# Patient Record
Sex: Female | Born: 1980
Health system: Southern US, Community
[De-identification: ages and names within clinical notes are randomized; demographics above are authoritative.]

## PROBLEM LIST (undated history)

## (undated) DIAGNOSIS — Q2730 Arteriovenous malformation, site unspecified: Secondary | ICD-10-CM

## (undated) DIAGNOSIS — D259 Leiomyoma of uterus, unspecified: Secondary | ICD-10-CM

## (undated) DIAGNOSIS — G43909 Migraine, unspecified, not intractable, without status migrainosus: Secondary | ICD-10-CM

## (undated) DIAGNOSIS — Z8774 Personal history of (corrected) congenital malformations of heart and circulatory system: Secondary | ICD-10-CM

## (undated) DIAGNOSIS — D649 Anemia, unspecified: Secondary | ICD-10-CM

## (undated) DIAGNOSIS — I10 Essential (primary) hypertension: Secondary | ICD-10-CM

## (undated) DIAGNOSIS — IMO0002 Reserved for concepts with insufficient information to code with codable children: Secondary | ICD-10-CM

## (undated) DIAGNOSIS — M549 Dorsalgia, unspecified: Secondary | ICD-10-CM

## (undated) DIAGNOSIS — N201 Calculus of ureter: Secondary | ICD-10-CM

## (undated) DIAGNOSIS — K219 Gastro-esophageal reflux disease without esophagitis: Secondary | ICD-10-CM

## (undated) DIAGNOSIS — G43709 Chronic migraine without aura, not intractable, without status migrainosus: Secondary | ICD-10-CM

## (undated) DIAGNOSIS — M2550 Pain in unspecified joint: Secondary | ICD-10-CM

## (undated) DIAGNOSIS — F419 Anxiety disorder, unspecified: Secondary | ICD-10-CM

## (undated) DIAGNOSIS — N200 Calculus of kidney: Secondary | ICD-10-CM

## (undated) HISTORY — DX: Essential (primary) hypertension: I10

## (undated) HISTORY — PX: BRAIN SURGERY: SHX531

## (undated) HISTORY — DX: Anxiety disorder, unspecified: F41.9

## (undated) HISTORY — PX: CEREBRAL EMBOLIZATION: SHX1327

## (undated) HISTORY — PX: CRANIOTOMY: SHX93

## (undated) HISTORY — DX: Pain in unspecified joint: M25.50

## (undated) HISTORY — DX: Dorsalgia, unspecified: M54.9

## (undated) HISTORY — DX: Arteriovenous malformation, site unspecified: Q27.30

## (undated) HISTORY — DX: Migraine, unspecified, not intractable, without status migrainosus: G43.909

## (undated) HISTORY — DX: Anemia, unspecified: D64.9

## (undated) HISTORY — PX: OTHER SURGICAL HISTORY: SHX169

---
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
## 2003-08-07 ENCOUNTER — Other Ambulatory Visit: Admission: RE | Admit: 2003-08-07 | Discharge: 2003-08-07 | Payer: Self-pay | Admitting: Obstetrics and Gynecology

## 2004-09-10 ENCOUNTER — Ambulatory Visit: Payer: Self-pay | Admitting: *Deleted

## 2006-05-16 ENCOUNTER — Emergency Department (HOSPITAL_COMMUNITY): Admission: EM | Admit: 2006-05-16 | Discharge: 2006-05-16 | Payer: Self-pay | Admitting: Family Medicine

## 2006-11-09 HISTORY — PX: CRANIOTOMY: SHX93

## 2007-04-07 ENCOUNTER — Emergency Department (HOSPITAL_COMMUNITY): Admission: EM | Admit: 2007-04-07 | Discharge: 2007-04-07 | Payer: Self-pay | Admitting: Emergency Medicine

## 2007-06-05 ENCOUNTER — Inpatient Hospital Stay (HOSPITAL_COMMUNITY): Admission: EM | Admit: 2007-06-05 | Discharge: 2007-06-08 | Payer: Self-pay | Admitting: Emergency Medicine

## 2007-06-05 ENCOUNTER — Encounter: Payer: Self-pay | Admitting: Internal Medicine

## 2007-06-05 IMAGING — CT CT HEAD W/O CM
1 series · 15 of 30 positions shown, 19 images · IV contrast (agent unspecified)
Comparison: None.

CLINICAL DATA: 25-year-old female, progressive severe migraine headaches. 
HEAD CT WITHOUT CONTRAST:
TECHNIQUE: Contiguous axial images were obtained from the base of the skull through the vertex according to standard protocol without contrast.

[Series 2: headseq 4.8 h45s · axial · 0.43mm/px · z∈[-209,-54]mm · 15 of 36 slices shown, 19 images]
[im 2/36  brain]
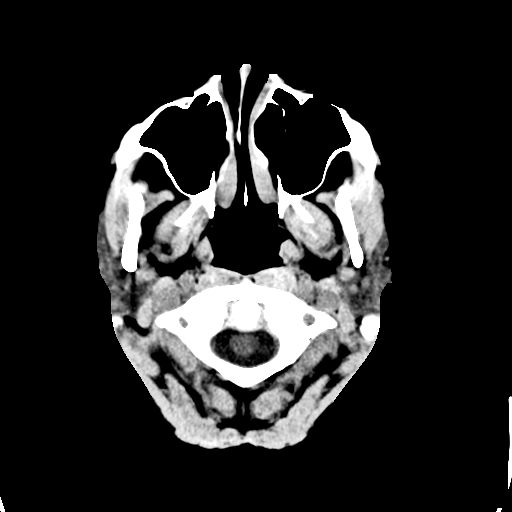
[im 2/36  bone]
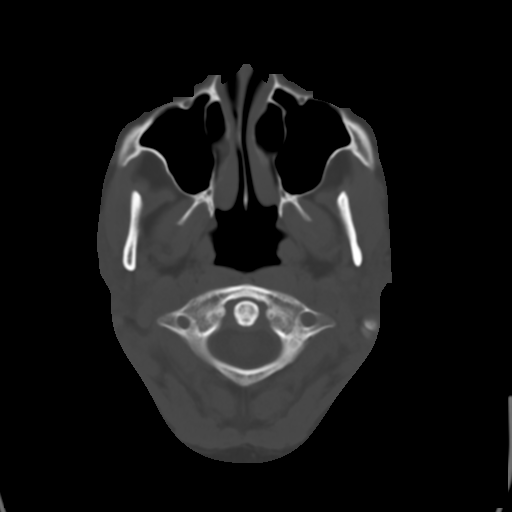
[im 4/36  brain]
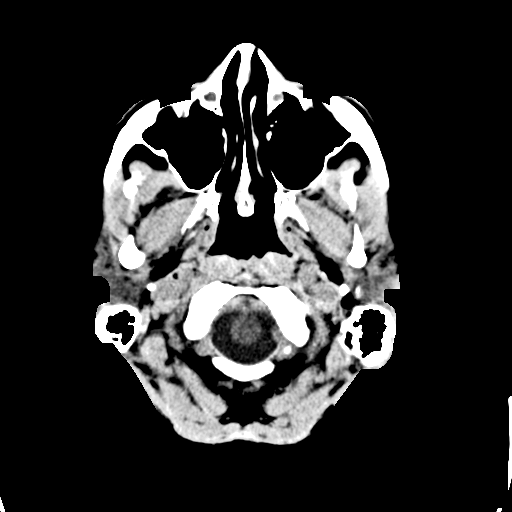
[im 7/36  brain]
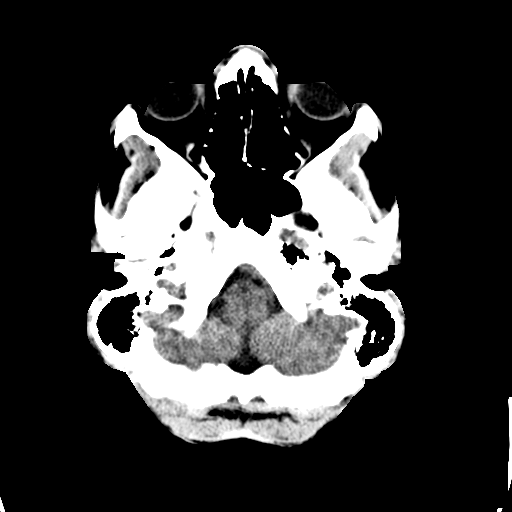
[im 9/36  brain]
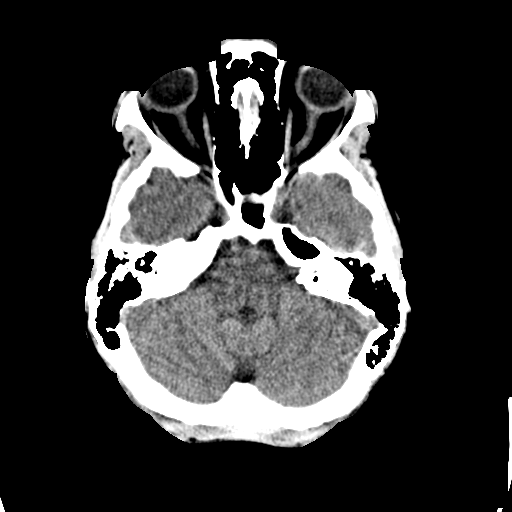
[im 11/36  brain]
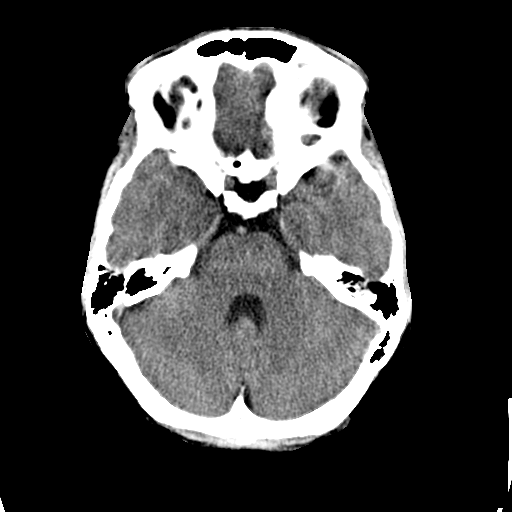
[im 11/36  bone]
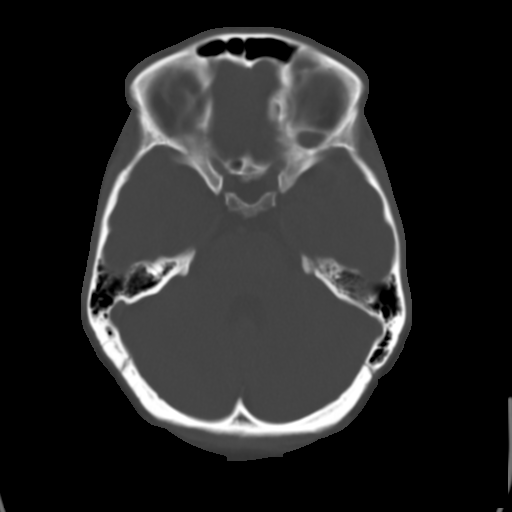
[im 14/36  brain]
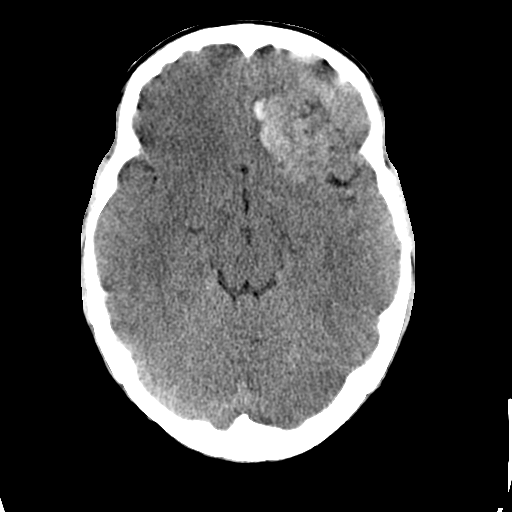
[im 16/36  brain]
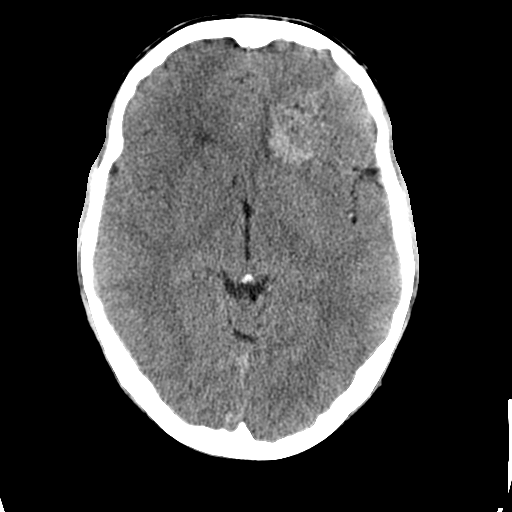
[im 19/36  brain]
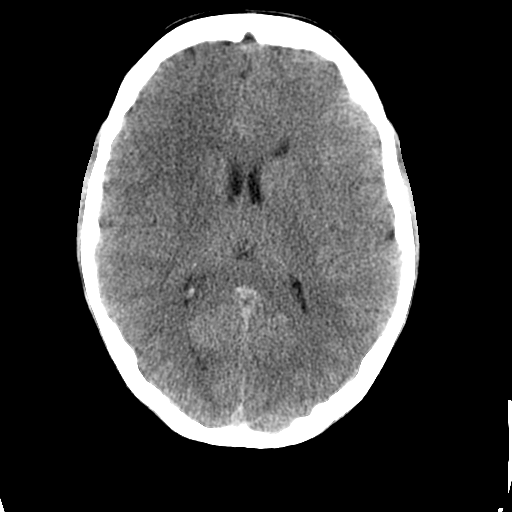
[im 20/36  brain]
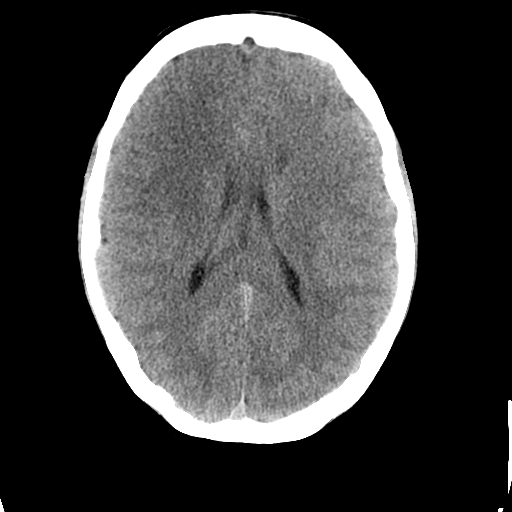
[im 20/36  bone]
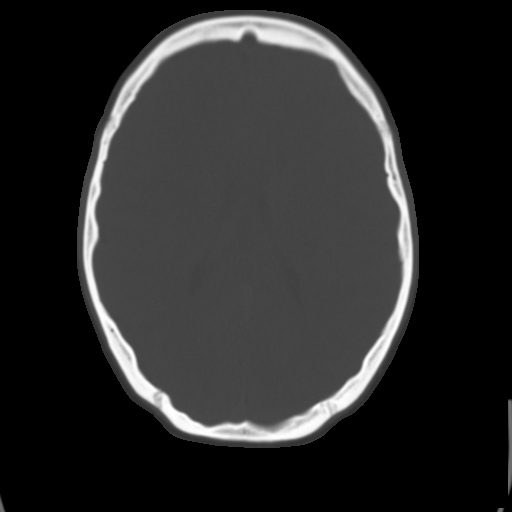
[im 22/36  brain]
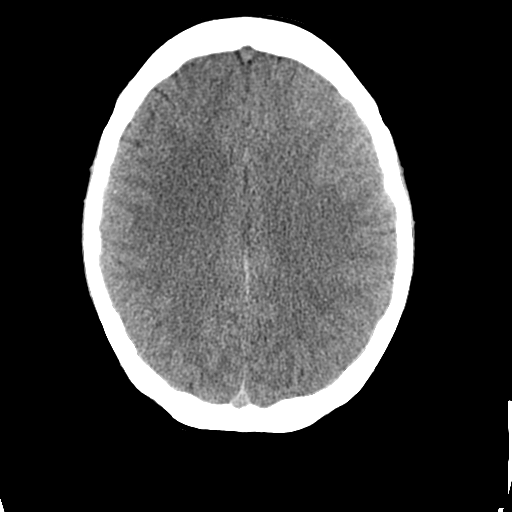
[im 25/36  brain]
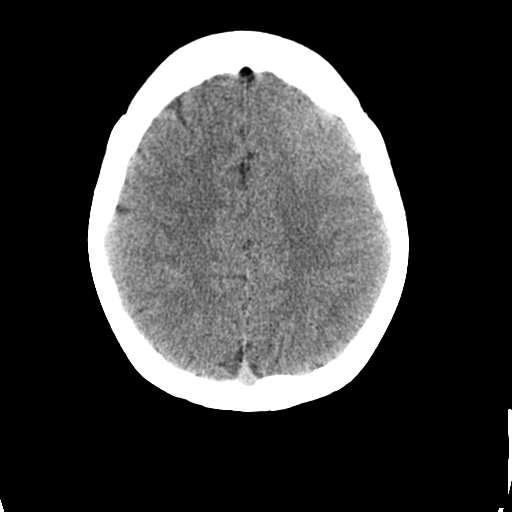
[im 27/36  brain]
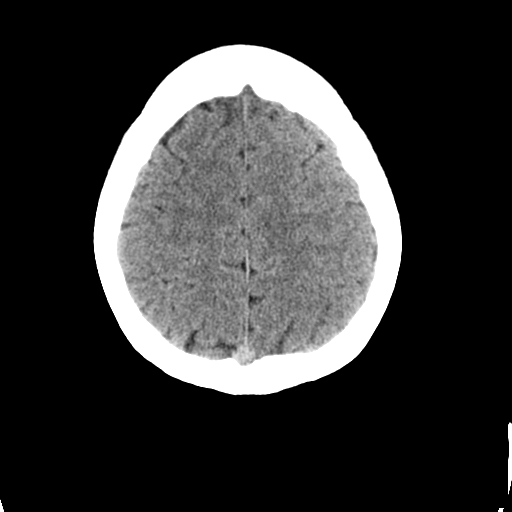
[im 29/36  brain]
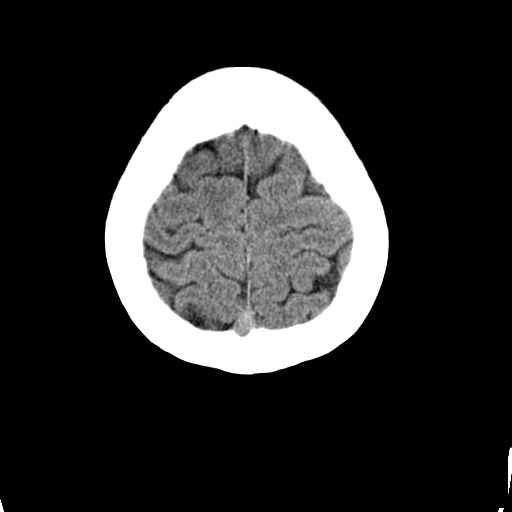
[im 29/36  bone]
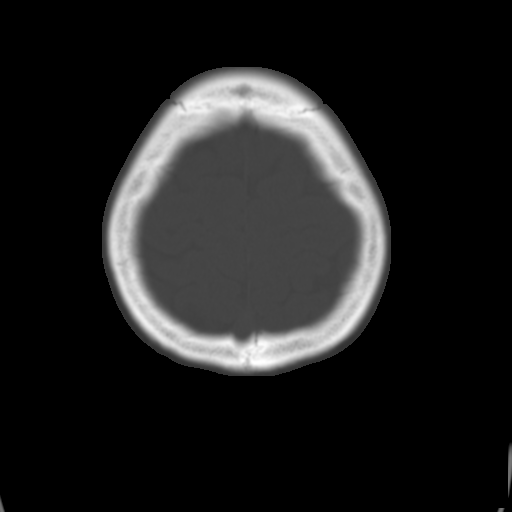
[im 32/36  brain]
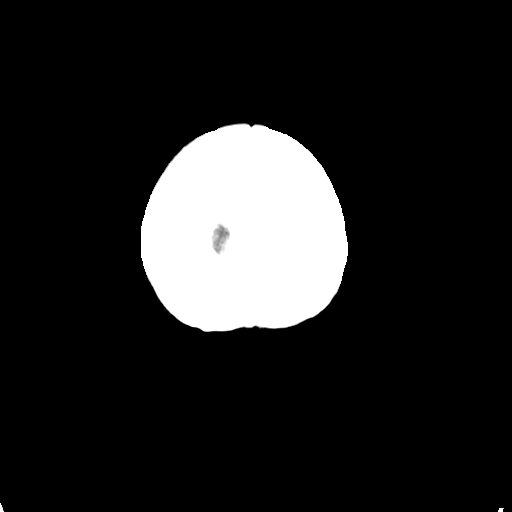
[im 34/36  brain]
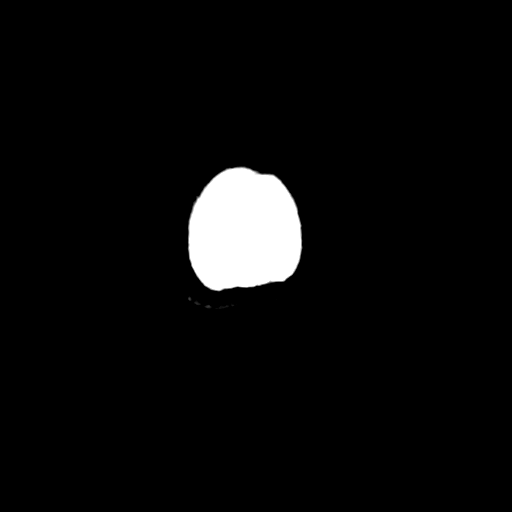

[15 of 30 positions shown; findings below may reference images not displayed]

FINDINGS: In the left frontal lobe, there is a hyperdense intracranial brain lesion with evidence of acute hemorrhage.  This appears to be intraaxial and has a somewhat serpiginous pattern.  Differential considerations would include a hemorrhagic intraaxial brain mass, metastasis, or vascular abnormality such as an arterial venous malformation.  There is mass effect in the left frontal lobe.  No significant midline shift or hydrocephalus.  No extraaxial collection.  Cisterns are patent.  
Mastoid sinuses are clear.
IMPRESSION: Left anterior frontal hemorrhagic 3 x 4 cm mass, serpiginous pattern is noted.  Hemorrhagic brain lesion, metastasis, or vascular lesion is suspected such as an AVM.  Further evaluation recommended with MRI including contrast and MRA.  
Note:  Findings were discussed with Dr. TIGER at [HOSPITAL].

## 2008-04-12 ENCOUNTER — Ambulatory Visit: Payer: Self-pay | Admitting: Family Medicine

## 2008-04-12 DIAGNOSIS — J069 Acute upper respiratory infection, unspecified: Secondary | ICD-10-CM | POA: Insufficient documentation

## 2008-05-21 ENCOUNTER — Ambulatory Visit: Payer: Self-pay | Admitting: Family Medicine

## 2008-05-21 DIAGNOSIS — J101 Influenza due to other identified influenza virus with other respiratory manifestations: Secondary | ICD-10-CM | POA: Insufficient documentation

## 2008-05-21 DIAGNOSIS — J111 Influenza due to unidentified influenza virus with other respiratory manifestations: Secondary | ICD-10-CM | POA: Insufficient documentation

## 2008-06-29 ENCOUNTER — Emergency Department (HOSPITAL_COMMUNITY): Admission: EM | Admit: 2008-06-29 | Discharge: 2008-06-29 | Payer: Self-pay | Admitting: Emergency Medicine

## 2008-08-03 ENCOUNTER — Ambulatory Visit (HOSPITAL_COMMUNITY): Admission: RE | Admit: 2008-08-03 | Discharge: 2008-08-03 | Payer: Self-pay | Admitting: Neurosurgery

## 2008-10-12 ENCOUNTER — Ambulatory Visit: Payer: Self-pay | Admitting: Family Medicine

## 2008-10-12 DIAGNOSIS — E785 Hyperlipidemia, unspecified: Secondary | ICD-10-CM | POA: Insufficient documentation

## 2008-10-12 LAB — CONVERTED CEMR LAB
ALT: 13 units/L (ref 0–35)
AST: 19 units/L (ref 0–37)
CO2: 24 meq/L (ref 19–32)
Chloride: 105 meq/L (ref 96–112)
Cholesterol: 164 mg/dL (ref 0–200)
Creatinine, Ser: 0.77 mg/dL (ref 0.40–1.20)
HDL: 41 mg/dL (ref >39)
Potassium: 4.3 meq/L (ref 3.5–5.3)
Total Bilirubin: 0.9 mg/dL (ref 0.3–1.2)
Total Protein: 7.3 g/dL (ref 6.0–8.3)
Triglycerides: 61 mg/dL (ref <150)

## 2008-10-15 ENCOUNTER — Encounter: Payer: Self-pay | Admitting: Family Medicine

## 2008-12-29 ENCOUNTER — Ambulatory Visit: Payer: Self-pay | Admitting: Family Medicine

## 2008-12-29 DIAGNOSIS — J0100 Acute maxillary sinusitis, unspecified: Secondary | ICD-10-CM | POA: Insufficient documentation

## 2009-01-24 ENCOUNTER — Inpatient Hospital Stay (HOSPITAL_COMMUNITY): Admission: AD | Admit: 2009-01-24 | Discharge: 2009-01-24 | Payer: Self-pay | Admitting: Obstetrics and Gynecology

## 2009-01-24 ENCOUNTER — Encounter (INDEPENDENT_AMBULATORY_CARE_PROVIDER_SITE_OTHER): Payer: Self-pay | Admitting: Certified Nurse Midwife

## 2009-07-05 ENCOUNTER — Encounter: Payer: Self-pay | Admitting: Obstetrics and Gynecology

## 2009-07-05 ENCOUNTER — Inpatient Hospital Stay (HOSPITAL_COMMUNITY): Admission: AD | Admit: 2009-07-05 | Discharge: 2009-07-08 | Payer: Self-pay | Admitting: Obstetrics and Gynecology

## 2009-07-06 ENCOUNTER — Encounter (INDEPENDENT_AMBULATORY_CARE_PROVIDER_SITE_OTHER): Payer: Self-pay | Admitting: Obstetrics and Gynecology

## 2009-07-10 LAB — HM PAP SMEAR: HM Pap smear: NORMAL

## 2009-10-15 ENCOUNTER — Ambulatory Visit: Payer: Self-pay | Admitting: Family Medicine

## 2009-10-15 ENCOUNTER — Encounter: Admission: RE | Admit: 2009-10-15 | Discharge: 2009-10-15 | Payer: Self-pay | Admitting: Family Medicine

## 2009-10-15 DIAGNOSIS — M25569 Pain in unspecified knee: Secondary | ICD-10-CM | POA: Insufficient documentation

## 2009-11-14 ENCOUNTER — Telehealth (INDEPENDENT_AMBULATORY_CARE_PROVIDER_SITE_OTHER): Payer: Self-pay | Admitting: *Deleted

## 2009-11-15 ENCOUNTER — Ambulatory Visit: Payer: Self-pay | Admitting: Family Medicine

## 2009-11-15 DIAGNOSIS — I10 Essential (primary) hypertension: Secondary | ICD-10-CM | POA: Insufficient documentation

## 2010-03-13 ENCOUNTER — Ambulatory Visit: Payer: Self-pay | Admitting: Family Medicine

## 2010-03-14 LAB — CONVERTED CEMR LAB
ALT: 15 units/L (ref 0–35)
BUN: 14 mg/dL (ref 6–23)
CO2: 23 meq/L (ref 19–32)
Calcium: 8.8 mg/dL (ref 8.4–10.5)
Chloride: 105 meq/L (ref 96–112)
Glucose, Bld: 81 mg/dL (ref 70–99)
HDL: 21 mg/dL — ABNORMAL LOW (ref >39)
LDL Cholesterol: 87 mg/dL (ref 0–99)
Potassium: 4.1 meq/L (ref 3.5–5.3)
Sodium: 138 meq/L (ref 135–145)
VLDL: 29 mg/dL (ref 0–40)

## 2010-12-01 ENCOUNTER — Encounter: Payer: Self-pay | Admitting: Interventional Radiology

## 2010-12-01 ENCOUNTER — Encounter: Payer: Self-pay | Admitting: Obstetrics and Gynecology

## 2010-12-09 NOTE — Assessment & Plan Note (Signed)
Summary: f/u HTN   Vital Signs:  Patient profile:   30 year old female Height:      63 inches Weight:      173 pounds BMI:     30.76 O2 Sat:      96 % on Room air Pulse rate:   87 / minute BP sitting:   131 / 84  (left arm) Cuff size:   regular  Vitals Entered By: Payton Spark CMA (November 15, 2009 10:10 AM)  O2 Flow:  Room air CC: Elevated BP. Has been taking labetalol (unsure of dosage) given to her while she was pregnant.   Primary Care Provider:  Seymour Bars DO  CC:  Elevated BP. Has been taking labetalol (unsure of dosage) given to her while she was pregnant..  History of Present Illness: 30 yo WF presents for f/u HTN.  She had Pranolol in the past for both HTN and migraines.  She was on Labetolol during her pregnancy and she has put herself back on it.  She feels like it's high at work.  She has checked it at work and her DBPs have consistenly been > 90.  She is getting some HAs.  She had AVM surgery a few years ago.      Current Medications (verified): 1)  Complete-Rf Prenatal 90-1 Mg Tabs (Prenatal W/o A-Fe Cbn-Dss-Fa) .... Take 1 Tablet By Mouth Once A Day 2)  Fish Oil 1200mg  .... Daily 3)  Tylenol 1000mg  .... Prn 4)  Meloxicam 7.5 Mg Tabs (Meloxicam) .[name].. 1-2 Tabs By Mouth Daily As Needed For Knee Pain  Allergies (verified): 1)  ! Sulfa 2)  ! Lisinopril 3)  ! Codeine  Past History:  Past Medical History: Reviewed history from 10/15/2009 and no changes required. HTN -- off meds. 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 migraines G0 OB - Dr Billy Coast  Past Surgical History: Reviewed history from 04/12/2008 and no changes required. 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  denies ETOH. Condoms for birth control. Walks some. Married to [employer]. Infant son 'Jean Rosenthal'  Review of Systems      See  HPI  Physical Exam  General:  alert, well-developed, well-nourished, and well-hydrated.   Head:  normocephalic and atraumatic.   Mouth:  pharynx pink and moist.   Neck:  no masses.   Lungs:  Normal respiratory effort, chest expands symmetrically. Lungs are clear to auscultation, no crackles or wheezes. Heart:  Normal rate and regular rhythm. S1 and S2 normal without gallop, murmur, click, rub or other extra sounds.   Impression & Recommendations:  Problem # 1:  ESSENTIAL HYPERTENSION, BENIGN (ICD-401.1) Familial HTN + migraines.  Will keep her on a beta blocker, changing to once daily ATenolol.  Call if any problems.  She will check her BPs are work and update her labs.  F/U in 4 mos. Her updated medication list for this problem includes:    Atenolol 50 Mg Tabs (Atenolol) .[name]... 1 tab by mouth daily  Orders: T-Comprehensive Metabolic Panel ([id number])  Complete Medication List: 1)  Complete-rf Prenatal 90-1 Mg Tabs (Prenatal w/o a-fe cbn-dss-fa) .... Take 1 tablet by mouth once a day 2)  Fish Oil 1200mg   .... Daily 3)  Tylenol 1000mg   .... Prn 4)  Meloxicam 7.5 Mg Tabs (Meloxicam) .[name].. 1-2 tabs by mouth daily as  needed for knee pain 5)  Atenolol 50 Mg Tabs (Atenolol) .[name].. 1 tab by mouth daily  Other Orders: T-Lipid Profile ([id number])  Patient Instructions: 1)  STart Atenolol 50 mg once daily. 2)  Update fasting labs and will call you w/ results the following day. 3)  Return for f/u HTN/ wt in 4 mos. 4)  Call if any problems on the new medication. 5)  Goal BP 100-130/ 60-80 Prescriptions: ATENOLOL 50 MG TABS (ATENOLOL) 1 tab by mouth daily  #30 x 3   Entered and Authorized by:   Kaitlin Bars DO   Signed by:   Kaitlin Bars DO on 11/15/2009   Method used:   Electronically to        [facility] [phone]* (retail)       [address]       [place], [place]  [address]       Ph: [phone]       Fax: [phone]   RxID:   [id number]

## 2010-12-09 NOTE — Assessment & Plan Note (Signed)
Summary: URI/ BP f/u   Vital Signs:  Patient profile:   30 year old female Height:      63 inches Weight:      176 pounds BMI:     31.29 O2 Sat:      99 % on Room air Temp:     98.6 degrees F oral Pulse rate:   82 / minute BP sitting:   117 / 79  (left arm) Cuff size:   regular  Vitals Entered By: Payton Spark CMA (Mar 13, 2010 10:23 AM)  O2 Flow:  Room air CC: F/U HTN. Also c/o runny nose and cough x 1 day   Primary Care Provider:  Seymour Bars DO  CC:  F/U HTN. Also c/o runny nose and cough x 1 day.  History of Present Illness: 30 yo WF presents for a cold that started yesterday.  She has chills, sore throat and runny nose.  No fevers.  Denies any GI upset.  Has a little cough.  She is only taking Zyrtec.  Her son was sick last wk.  She is not taking anything for her cold.  Her migraines have improved.  She has only had 1 HA about every 2 months with the new start of Atenolol for both migraine prevention and HTN.  Her BP looks perfect.  She is tolerated the meds just fine.    Current Medications (verified): 1)  Atenolol 50 Mg Tabs (Atenolol) .[name].. 1 Tab By Mouth Daily 2)  Minocycline Hcl 100 Mg Tabs (Minocycline Hcl) .... Take 1-2 Tabs By Mouth Once Daily 3)  Zyrtec Allergy 10 Mg Tabs (Cetirizine Hcl) .... Take 1 Tab By Mouth Once Daily  Allergies (verified): 1)  ! Sulfa 2)  ! Lisinopril 3)  ! Codeine  Past History:  Past Medical History: HTN  AVM, leaky -- sees Dr Elvina Sidle Neurosurgery at [facility], has f/u MRI 9-09. migraines G1P1 OB - Dr Billy Coast  Past Surgical History: Reviewed history from 04/12/2008 and no changes required. 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 father alive HTN, ETOH mother healthy sister healthy  Social History: Reviewed history from 10/15/2009 and no changes required. 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 Walks some. Married to [employer]. Infant son 'Jean Rosenthal'  Review of Systems      See HPI  Physical Exam  General:  alert, well-developed, well-nourished, and well-hydrated.   Head:  normocephalic and atraumatic.  sinuses NTTP Eyes:  conjunctiva clear Ears:  EACs patent; TMs translucent and gray with good cone of light and bony landmarks.  Nose:  clear rhinorrhea with boggy turbinates Mouth:  o/p injected and raw but no exudates or vesicles Lungs:  Normal respiratory effort, chest expands symmetrically. Lungs are clear to auscultation, no crackles or wheezes. Heart:  Normal rate and regular rhythm. S1 and S2 normal without gallop, murmur, click, rub or other extra sounds. Extremities:  no E/C/C Skin:  color normal.   Cervical Nodes:  shotty anterior cervical chain LNs Psych:  good eye contact, not anxious appearing, and not depressed appearing.     Impression & Recommendations:  Problem # 1:  ESSENTIAL HYPERTENSION, BENIGN (ICD-401.1) Assessment Improved Improved BP and it has helped with migraine prevention.  Continue.  Recheck 6 mos and update fasting labs today. Her updated medication list for this problem includes:    Atenolol 50 Mg Tabs (  Atenolol) .[name]... 1 tab by mouth daily  Orders: T-Comprehensive Metabolic Panel ([id number])  BP today: 117/79 Prior BP: 131/84 (11/15/2009)  Labs Reviewed: K+: 4.3 (10/12/2008) Creat: : 0.77 (10/12/2008)   Chol: 164 (10/12/2008)   HDL: 41 (10/12/2008)   LDL: 111 (10/12/2008)   TG: 61 (10/12/2008)  Problem # 2:  VIRAL URI (ICD-465.9)  The following medications were removed from the medication list:    Meloxicam 7.5 Mg Tabs (Meloxicam) .[name]... 1-2 tabs by mouth daily as needed for knee pain Her updated medication list for this problem includes:    Zyrtec Allergy 10 Mg Tabs (Cetirizine hcl) .[name]... Take 1 tab by mouth once daily  Instructed on symptomatic treatment. Call if symptoms persist or worsen.   Complete Medication List: 1)   Atenolol 50 Mg Tabs (Atenolol) .[name].. 1 tab by mouth daily 2)  Minocycline Hcl 100 Mg Tabs (Minocycline hcl) .... Take 1-2 tabs by mouth once daily 3)  Zyrtec Allergy 10 Mg Tabs (Cetirizine hcl) .... Take 1 tab by mouth once daily  Other Orders: T-Lipid Profile ([id number])  Patient Instructions: 1)  Update fasting labs today. 2)  Will call you w/ results on Monday. 3)  Stay on ATenolol. 4)  BP is perfect! 5)  Return for f/u in 6 mos. Prescriptions: ATENOLOL 50 MG TABS (ATENOLOL) 1 tab by mouth daily  #30 x 6   Entered and Authorized by:   Seymour Bars DO   Signed by:   Seymour Bars DO on 03/13/2010   Method used:   Electronically to        [facility]* (retail)       [address]       [address]/mailing       [place], [place]  [address]       Ph: [phone]       Fax: [phone]   RxID:   [id number]

## 2010-12-09 NOTE — Progress Notes (Signed)
Summary: Elevated BP  Phone Note Call from Patient   Caller: Patient Summary of Call: Pt called requesting Rx for elevated BP. I explained to Pt that she would have to come in for OV to check BP and discuss meds. Apt scheduled for tomorrow AM.  Initial call taken by: Payton Spark CMA,  November 14, 2009 1:17 PM     Appended Document: Elevated BP agree.  Seymour Bars, D.O.

## 2010-12-26 ENCOUNTER — Encounter: Payer: Self-pay | Admitting: Emergency Medicine

## 2010-12-26 ENCOUNTER — Ambulatory Visit (INDEPENDENT_AMBULATORY_CARE_PROVIDER_SITE_OTHER): Payer: Self-pay | Admitting: Emergency Medicine

## 2010-12-26 DIAGNOSIS — J069 Acute upper respiratory infection, unspecified: Secondary | ICD-10-CM

## 2010-12-31 NOTE — Assessment & Plan Note (Signed)
Summary: COUGH,CONGESTION,SORE THROAT/TJ Rm 5   Vital Signs:  Patient Profile:   30 Years Old Female CC:      Cough x 3 wks Height:     63 inches Weight:      165 pounds O2 Sat:      100 % O2 treatment:    Room Air Temp:     98.9 degrees F oral Pulse rate:   81 / minute Pulse rhythm:   regular Resp:     16 per minute BP sitting:   161 / 99  (left arm) Cuff size:   regular  Vitals Entered By: Emilio Math (December 26, 2010 7:49 PM)                  Current Allergies (reviewed today): ! SULFA ! LISINOPRIL ! CODEINEHistory of Present Illness History from: patient Chief Complaint: Cough x 3 wks History of Present Illness: 30 Years Old Female complains of onset of cold symptoms for 3 weeks.  Fred has been using OTC cough and cold meds which is helping a little bit.  She is a Engineer, civil (consulting) on cardiac telemetry. + sore throat + cough No pleuritic pain No wheezing + nasal congestion + post-nasal drainage No sinus pain/pressure No chest congestion No itchy/red eyes No earache No hemoptysis No SOB No chills/sweats No fever No nausea No vomiting No abdominal pain No diarrhea No skin rashes + fatigue + myalgias + headache   Current Meds ATENOLOL 50 MG TABS (ATENOLOL) 1 tab by mouth daily MINOCYCLINE HCL 100 MG TABS (MINOCYCLINE HCL) Take 1-2 tabs by mouth once daily ZYRTEC ALLERGY 10 MG TABS (CETIRIZINE HCL) Take 1 tab by mouth once daily ZITHROMAX Z-PAK 250 MG TABS (AZITHROMYCIN) use as directed CHERATUSSIN AC 100-10 MG/5ML SYRP (GUAIFENESIN-CODEINE) 5cc q6 hrs as needed cough PREDNISONE 10 MG TABS (PREDNISONE) 20mg  two times a day for 2 days, then 10 mg two times a day for 2 days, then stop  REVIEW OF SYSTEMS Constitutional Symptoms      Denies fever, chills, night sweats, weight loss, weight gain, and fatigue.  Eyes       Denies change in vision, eye pain, eye discharge, glasses, contact lenses, and eye surgery. Ear/Nose/Throat/Mouth       Complains of frequent  runny nose, sinus problems, and hoarseness.      Denies hearing loss/aids, change in hearing, ear pain, ear discharge, dizziness, frequent nose bleeds, sore throat, and tooth pain or bleeding.  Respiratory       Complains of dry cough.      Denies productive cough, wheezing, shortness of breath, asthma, bronchitis, and emphysema/COPD.  Cardiovascular       Denies murmurs, chest pain, and tires easily with exhertion.    Gastrointestinal       Denies stomach pain, nausea/vomiting, diarrhea, constipation, blood in bowel movements, and indigestion. Genitourniary       Denies painful urination, kidney stones, and loss of urinary control. Neurological       Denies paralysis, seizures, and fainting/blackouts. Musculoskeletal       Denies muscle pain, joint pain, joint stiffness, decreased range of motion, redness, swelling, muscle weakness, and gout.  Skin       Denies bruising, unusual mles/lumps or sores, and hair/skin or nail changes.  Psych       Denies mood changes, temper/anger issues, anxiety/stress, speech problems, depression, and sleep problems.  Past History:  Past Medical History: Reviewed history from 03/13/2010 and no changes required. HTN  AVM, leaky --  sees Dr Elvina Sidle Neurosurgery at [facility], has f/u MRI 9-09. migraines G1P1 OB - Dr Billy Coast  Past Surgical History: Reviewed history from 04/12/2008 and no changes required. 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 father alive HTN, ETOH mother healthy sister healthy  Social History: Reviewed history from 10/15/2009 and no changes required. 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 Condoms for birth control. Walks some. Married to [employer]. Infant son Jean Rosenthal' Physical Exam General appearance: well developed, well nourished, no acute distress Ears: normal, no lesions or deformities Nasal: mucosa pink, nonedematous, no  septal deviation, turbinates normal Oral/Pharynx: tongue normal, posterior pharynx without erythema or exudate Chest/Lungs: no rales, wheezes, or rhonchi bilateral, breath sounds equal without effort Heart: regular rate and  rhythm, no murmur MSE: oriented to time, place, and person Assessment New Problems: UPPER RESPIRATORY INFECTION, ACUTE (ICD-465.9)   Patient Education: Patient and/or caregiver instructed in the following: rest, fluids.  Plan New Medications/Changes: PREDNISONE 10 MG TABS (PREDNISONE) 20mg  two times a day for 2 days, then 10 mg two times a day for 2 days, then stop  #QS x 0, 12/26/2010, Hoyt Koch MD CHERATUSSIN AC 100-10 MG/5ML SYRP (GUAIFENESIN-CODEINE) 5cc q6 hrs as needed cough  #5oz x 0, 12/26/2010, Hoyt Koch MD ZITHROMAX Z-PAK 250 MG TABS (AZITHROMYCIN) use as directed  #1 x 0, 12/26/2010, Hoyt Koch MD  New Orders: Est. Patient Level III [phone] Planning Comments:   1)  Take the prescribed antibiotic as instructed. 2)  Use nasal saline solution (over the counter) at least 3 times a day. 3)  Use over the counter antihistamine like Zyrtec every 12 hours as needed to help with congestion. 4)  Can take tylenol every 6 hours or motrin every 8 hours for pain or fever. 5)  Follow up with your primary doctor  if no improvement in 5-7 days, sooner if increasing pain, fever, or new symptoms.   Follow up with you PCP about your HTN If cheratussin makes your stomach feel sick, stop the med and call our clinic and we will call in something different.   The patient and/or caregiver has been counseled thoroughly with regard to medications prescribed including dosage, schedule, interactions, rationale for use, and possible side effects and they verbalize understanding.  Diagnoses and expected course of recovery discussed and will return if not improved as expected or if the condition worsens. Patient and/or caregiver verbalized understanding.    Prescriptions: PREDNISONE 10 MG TABS (PREDNISONE) 20mg  two times a day for 2 days, then 10 mg two times a day for 2 days, then stop  #QS x 0   Entered and Authorized by:   Hoyt Koch MD   Signed by:   Hoyt Koch MD on 12/26/2010   Method used:   Print then Give to Patient   RxID:   [id number] CHERATUSSIN AC 100-10 MG/5ML SYRP (GUAIFENESIN-CODEINE) 5cc q6 hrs as needed cough  #5oz x 0   Entered and Authorized by:   Hoyt Koch MD   Signed by:   Hoyt Koch MD on 12/26/2010   Method used:   Print then Give to Patient   RxID:   [id number] ZITHROMAX Z-PAK 250 MG TABS (AZITHROMYCIN) use as directed  #1 x 0   Entered and Authorized by:   Hoyt Koch MD   Signed by:   Hoyt Koch MD on 12/26/2010   Method used:   Print then  Give to Patient   RxID:   [id number]   Orders Added: 1)  Est. Patient Level III [56213]

## 2011-01-29 ENCOUNTER — Other Ambulatory Visit: Payer: Self-pay | Admitting: Family Medicine

## 2011-01-29 DIAGNOSIS — I10 Essential (primary) hypertension: Secondary | ICD-10-CM

## 2011-02-14 LAB — CBC
HCT: 40.8 % (ref 36.0–46.0)
Hemoglobin: 14.1 g/dL (ref 12.0–15.0)
Hemoglobin: 14.1 g/dL (ref 12.0–15.0)
MCV: 95 fL (ref 78.0–100.0)
Platelets: 154 10*3/uL (ref 150–400)
Platelets: 180 10*3/uL (ref 150–400)
RBC: 4.29 MIL/uL (ref 3.87–5.11)
RDW: 12.7 % (ref 11.5–15.5)
WBC: 10.9 10*3/uL — ABNORMAL HIGH (ref 4.0–10.5)
WBC: 11.1 10*3/uL — ABNORMAL HIGH (ref 4.0–10.5)
WBC: 8.3 10*3/uL (ref 4.0–10.5)

## 2011-02-14 LAB — URIC ACID
Uric Acid, Serum: 5.6 mg/dL (ref 2.4–7.0)
Uric Acid, Serum: 5.8 mg/dL (ref 2.4–7.0)

## 2011-02-14 LAB — COMPREHENSIVE METABOLIC PANEL
ALT: 18 U/L (ref 0–35)
AST: 25 U/L (ref 0–37)
AST: 26 U/L (ref 0–37)
Alkaline Phosphatase: 104 U/L (ref 39–117)
BUN: 11 mg/dL (ref 6–23)
BUN: 9 mg/dL (ref 6–23)
CO2: 23 mEq/L (ref 19–32)
Calcium: 9.3 mg/dL (ref 8.4–10.5)
Chloride: 105 mEq/L (ref 96–112)
Chloride: 106 mEq/L (ref 96–112)
Creatinine, Ser: 0.67 mg/dL (ref 0.4–1.2)
GFR calc Af Amer: >60 mL/min (ref >60)
GFR calc non Af Amer: >60 mL/min (ref >60)
Glucose, Bld: 77 mg/dL (ref 70–99)
Glucose, Bld: 91 mg/dL (ref 70–99)
Potassium: 4.3 mEq/L (ref 3.5–5.1)
Sodium: 136 mEq/L (ref 135–145)
Total Bilirubin: 0.4 mg/dL (ref 0.3–1.2)

## 2011-02-14 LAB — LACTATE DEHYDROGENASE: LDH: 91 U/L — ABNORMAL LOW (ref 94–250)

## 2011-03-24 NOTE — Op Note (Signed)
NAMEJENICKA, Kaitlin Foster                ACCOUNT NO.:  [id number]   MEDICAL RECORD NO.:  [id number]          PATIENT TYPE:  INP   LOCATION:  9305                          FACILITY:  [facility]   PHYSICIAN:  Lenoard Aden, M.D.DATE OF BIRTH:  06-21-1981   DATE OF PROCEDURE:  07/06/2009  DATE OF DISCHARGE:                               OPERATIVE REPORT   PREOPERATIVE DIAGNOSES:  A 34-week intrauterine pregnancy with chronic  hypertension, severe IUGR with abnormal Doppler studies.   POSTOPERATIVE DIAGNOSES:  A 34-week intrauterine pregnancy with chronic  hypertension, severe IUGR with abnormal Doppler studies.   PROCEDURE:  Primary low segment transverse cesarean section.   SURGEON:  Lenoard Aden, MD   ASSISTANT:  Dineen Kid. Rana Snare, MD   ANESTHESIA:  Spinal.   ANESTHESIOLOGIST:  Charlynne Cousins, DO   ESTIMATED BLOOD LOSS:  800 mL.   COMPLICATIONS:  None.   DRAINS:  Foley.   COUNTS:  Correct.   The patient's recovery in good condition.   FINDINGS:  Premature female fetus, Apgars 9 and 9.  Cord pH 7.28.  Placenta posterior position sent to Pathology.  Normal tubes, normal  ovaries.  Two-layer uterine closure.   BRIEF OPERATIVE NOTE:  After being apprised of risks of anesthesia,  infection, bleeding, injury to abdominal organs with need for repair,  delayed versus immediate complications to include bowel and bladder  injury with need for repair, the patient brought to the operating room  where she was administered spinal anesthetic without complications,  prepped and draped in sterile fashion.  Foley catheter placed after  achieving adequate anesthesia.  Dilute Marcaine solution placed.  A  Pfannenstiel skin incision made with scalpel, carried down to fascia  which nicked in the midline and opened transversely using Mayo scissors.  Rectus muscles dissected sharply and midline peritoneum entered sharply.  Bladder blade placed.  Visceral peritoneum scored sharply off the lower  uterine  segment.  Kerr hysterotomy incision made atraumatic delivery,  vacuum assistance one pull.  Premature female fetus, Apgars 9 and 9.  Cord  blood collected.  Placenta delivered manually, intact three-vessel cord.  Placenta delivered manually, sent to Pathology.  Uterus exteriorized,  curetted using a dry lap pack and closed in two running imbricating  layers of 0 Monocryl suture.  Multiple interrupted sutures were placed  in the midline to obtain hemostasis.  Irrigation accomplished.  Bladder  flap inspected and found to be hemostatic.  Parietal peritoneum was then  closed using a 2-0 chromic in running fashion.  Fascia closed using a 0  Monocryl in continuous running fashion.  Skin closed using staples.  The  patient tolerated the procedure well and was transferred to recovery in  good condition.      Lenoard Aden, M.D.  Electronically Signed     RJT/MEDQ  D:  07/06/2009  T:  07/07/2009  Job:  [id number]

## 2011-03-24 NOTE — Discharge Summary (Signed)
Kaitlin Foster, Kaitlin Foster                ACCOUNT NO.:  [id number]   MEDICAL RECORD NO.:  [id number]          PATIENT TYPE:  INP   LOCATION:  3028                         FACILITY:  [facility]   PHYSICIAN:  Clydene Fake, M.D.  DATE OF BIRTH:  1981/01/11   DATE OF ADMISSION:  06/05/2007  DATE OF DISCHARGE:  06/08/2007                               DISCHARGE SUMMARY   DIAGNOSIS:  Intracranial hemorrhage, left frontal arteriovenous  malformation.   DISCHARGE DIAGNOSIS:  Intracranial hemorrhage, left frontal  arteriovenous malformation.   PROCEDURE:  Four-vessel cerebral angiogram.   REASON FOR ADMISSION:  Patient is a 30 year old, left-handed, white  female, who works at [employer], who had a 3 day history of a severe  headache with some mild intermittent nausea.  A CT was set up, showing a  left frontal mass with some associated hemorrhage.  Patient was admitted  to the ICU for observation and workup.  The patient was neurologically  intact at admission.   HOSPITAL COURSE:  The patient was admitted.  An MRI was done.  It showed  a large AVM, left frontal lobe with some hemorrhage immediately and some  edema surrounding brain because of this.  Minimal mass effect.  The  patient remain neurologically intact, watched a couple of days in the  ICU and then transferred to the floor.  She had been up ambulating and  doing well.  A 4-vessel cerebral angiogram was done on July 29, to  evaluate this further and this AVM was large , has two big feeders  from  the ACA with __________ aneurysms, two large feeders from the left MCA  and then multiple small left feeders of the lenticular striatae with one  major venous drainage coming anteriorly and going into the beginning of  the superior saggital sinus.   DISCUSSION OF TREATMENT PLAN:  It was decided to wait a few weeks to let  the brain edema calm down and then plan resection of this AVM with  having embolization done first to try to embolize 2  ACA vessels and the  2 MCA vessels first and then 24-48 hours later take to surgery for a  craniotomy and resection of the tumor.  We will followup patient in 2  weeks in the office to see how she is doing and to discuss any questions  for this.  We went over this multiple times here in the hospital and we  will consent setting up these interventions with Dr. Corliss Skains and  ourselves.  We did send her home with Vicodin p.r.n. and Valium p.r.n.  and she is to take the Propranolol as prescribed by her medical doctor  to keep her blood pressure under control.  No stress activity.           ______________________________  Clydene Fake, M.D.     JRH/MEDQ  D:  06/08/2007  T:  06/08/2007  Job:  [id number]

## 2011-03-24 NOTE — H&P (Signed)
Kaitlin Foster, Kaitlin Foster                ACCOUNT NO.:  [id number]   MEDICAL RECORD NO.:  [id number]        PATIENT TYPE:  WINP   LOCATION:                                FACILITY:  [facility]   PHYSICIAN:  Lenoard Aden, M.D.DATE OF BIRTH:  03/31/1981   DATE OF ADMISSION:  07/05/2009  DATE OF DISCHARGE:                              HISTORY & PHYSICAL   CHIEF COMPLAINT:  Chronic hypertension, rule out superimposed  preeclampsia with IUGR, and abnormal Dopplers.   She is a 30 year old, white female, G1, P0, currently at 34-2/7 weeks  with new-onset IUGR, abnormal Dopplers, elevated blood pressure.  She  has a past medical history of migraine headaches and history of  craniotomy for an AVM, history of hypertension, off medications prior to  pregnancy.   ALLERGIES:  To CODEINE, LISINOPRIL, and SULFA.   Medications include labetalol, Zantac, Colace, Zyrtec, prenatal  vitamins, and Tylenol p.r.n.   She has a family history of chronic hypertension, COPD, previous  pregnancy history noncontributory.  She is a nonsmoker, nondrinker.  She  denies domestic or physical violence.   Prenatal course has been complicated by elevated blood pressure.  Normal  labs, normal 24-hour urine most recently performed on June 13, 2009.  She was started on labetalol in the mid second trimester and has been  stable up to today.  Today, she presented with the aforementioned  issues.   REVIEW OF SYSTEMS:  Otherwise negative.  The patient denies headaches,  visual symptoms, or epigastric pain.   PHYSICAL EXAMINATION:  VITAL SIGNS:  Blood pressure 160/100.  HEENT:  Normal.  LUNGS:  Clear.  NECK:  Supple.  Full range of motion.  HEART:  Regular rate and rhythm.  ABDOMEN:  Soft, gravid, nontender.  No CVA tenderness.  CERVIX:  Deferred.  EXTREMITIES:  No cords.  DTRs 2+ no evidence of clonus.  NEUROLOGIC:  Nonfocal.  SKIN:  Intact.   NST is pending, 24-hour urine pending, PIH labs pending, MFM consult  pending.   IMPRESSION:  1. This 34-2/7 week intrauterine pregnancy.  2. Chronic hypertension on labetalol rule out superimposed      preeclampsia.  3. New-onset intrauterine growth restriction with abnormal Dopplers      normal amniotic fluid index.   PLAN:  Continuous fetal surveillance, repeat Dopplers today, BPP 8/8  today we will repeat.  We will obtain a 24-hour urine PIH labs as noted,  maternal fetal medicine consult, anticipate inpatient hospitalization  possibly until delivery.      Lenoard Aden, M.D.  Electronically Signed     RJT/MEDQ  D:  07/05/2009  T:  07/06/2009  Job:  [id number]

## 2011-04-01 ENCOUNTER — Ambulatory Visit (INDEPENDENT_AMBULATORY_CARE_PROVIDER_SITE_OTHER): Payer: 59 | Admitting: Family Medicine

## 2011-04-01 ENCOUNTER — Encounter: Payer: Self-pay | Admitting: Family Medicine

## 2011-04-01 DIAGNOSIS — X58XXXA Exposure to other specified factors, initial encounter: Secondary | ICD-10-CM

## 2011-04-01 DIAGNOSIS — IMO0002 Reserved for concepts with insufficient information to code with codable children: Secondary | ICD-10-CM | POA: Insufficient documentation

## 2011-04-01 DIAGNOSIS — T148XXA Other injury of unspecified body region, initial encounter: Secondary | ICD-10-CM

## 2011-04-01 DIAGNOSIS — I10 Essential (primary) hypertension: Secondary | ICD-10-CM

## 2011-04-01 MED ORDER — DILTIAZEM HCL ER COATED BEADS 120 MG PO CP24: 120.0000 mg | ORAL_CAPSULE | Freq: Every day | ORAL | Status: DC

## 2011-04-01 NOTE — Patient Instructions (Signed)
Keep foot clean and use topical antibiotic ointment. Watch for redness to develop.  If redness or fevers, please call me.  Change Atenolol to Cardizem CD 120 mg/ day.  Call me if any problems.  REturn for f/u in 2-3 mos.

## 2011-04-01 NOTE — Assessment & Plan Note (Signed)
DBP at the [facility] and is having some SEs from atenolol.  Will change her to Cardizem CD 120 mg/ day.  Call if anyproblems..  This should also help her migraines.

## 2011-04-01 NOTE — Progress Notes (Signed)
  Subjective:    Patient ID: Kaitlin Foster, female    DOB: 1981/05/24, 30 y.o.   MRN: [id number]  HPI 30 yo WF presents for a sore L foot x 3 days.  [place] the beach and remembers stepping on what she thinks was a sharp shell.  Denies any bleeding but it has been sore ever since.  Denies any redness, swelling or bruising.  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  Denies fevers or chills.    She would like to change her Atenolol which she was started on for HTN with mild migraine hx.  She is only using condoms for birth control.  She feels tired from this and sometimes skips it due to this effect.  BP 129/90  Pulse 82  Ht 5\' 3"  (1.6 m)  Wt 170 lb (77.111 kg)  BMI 30.11 kg/m2  SpO2 99%  LMP 02/25/2011   Review of Systems  Constitutional: Positive for fatigue. Negative for fever and chills.  Musculoskeletal: Negative for joint swelling, arthralgias and gait problem.  Neurological: Negative for headaches.       Objective:   Physical Exam  Constitutional: She appears well-developed and well-nourished. No distress.  Musculoskeletal: She exhibits no edema.  Skin: Skin is warm and dry.       Cleaned off lateral plantar surface of the L foot with betadine and alcohol and looked at lesion in question with magnifying light.  No foreign body seen but it appears to be a small superficial laceration.  No soft tissue edema, bruising, redness or bleeding.          Assessment & Plan:

## 2011-04-01 NOTE — Assessment & Plan Note (Signed)
Tiny foot laceration from sharp shell at the beach w/o sign of foreign body under light magnification.  Will treat with supportive care - epson salt soaks, soap and water and polysporin oitnment.  Tdap is UTD and she will call if any fevers, swelling or redness occurs.

## 2011-05-31 ENCOUNTER — Encounter: Payer: Self-pay | Admitting: Family Medicine

## 2011-06-05 ENCOUNTER — Encounter: Payer: Self-pay | Admitting: Family Medicine

## 2011-06-05 ENCOUNTER — Ambulatory Visit (INDEPENDENT_AMBULATORY_CARE_PROVIDER_SITE_OTHER): Payer: 59 | Admitting: Family Medicine

## 2011-06-05 DIAGNOSIS — E785 Hyperlipidemia, unspecified: Secondary | ICD-10-CM

## 2011-06-05 DIAGNOSIS — I10 Essential (primary) hypertension: Secondary | ICD-10-CM

## 2011-06-05 LAB — CBC WITH DIFFERENTIAL/PLATELET
HCT: 42.4 % (ref 36.0–46.0)
Hemoglobin: 13.5 g/dL (ref 12.0–15.0)
Lymphocytes Relative: 32 % (ref 12–46)
Monocytes Absolute: 0.6 10*3/uL (ref 0.1–1.0)
Monocytes Relative: 9 % (ref 3–12)
Neutro Abs: 3.8 10*3/uL (ref 1.7–7.7)
RBC: 4.96 MIL/uL (ref 3.87–5.11)
WBC: 6.7 10*3/uL (ref 4.0–10.5)

## 2011-06-05 LAB — LIPID PANEL
Cholesterol: 151 mg/dL (ref 0–200)
VLDL: 16 mg/dL (ref 0–40)

## 2011-06-05 MED ORDER — DILTIAZEM HCL ER COATED BEADS 120 MG PO CP24: 120.0000 mg | ORAL_CAPSULE | Freq: Every day | ORAL | Status: DC

## 2011-06-05 NOTE — Progress Notes (Signed)
  Subjective:    Patient ID: Kaitlin Foster, female    DOB: 01-04-1981, 30 y.o.   MRN: [id number]  HPI  30 yo WF presents for f/u HTN and migrianes.  She feels better on Cardizem.  It has helped both her BP and her migraines.  She is exercising more.  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  Has hereditary dyslipidemia, not on meds.  BP 122/79  Pulse 89  Resp 20  Ht 5\' 3"  (1.6 m)  Wt 166 lb (75.297 kg)  BMI 29.41 kg/m2  SpO2 97%  LMP 05/06/2011    Review of Systems  Constitutional: Negative for fatigue.  Eyes: Negative for visual disturbance.  Gastrointestinal: Negative for nausea.  Neurological: Positive for headaches (decreased frequency).  Psychiatric/Behavioral: Negative for sleep disturbance.       Objective:   Physical Exam  Constitutional: She appears well-developed and well-nourished. No distress.  HENT:  Mouth/Throat: Oropharynx is clear and moist.  Eyes: Pupils are equal, round, and reactive to light.  Neck: Neck supple. No thyromegaly present.  Cardiovascular: Normal rate, regular rhythm and normal heart sounds.   Pulmonary/Chest: Effort normal and breath sounds normal. No respiratory distress.  Musculoskeletal: She exhibits no edema.  Skin: Skin is warm and dry.  Psychiatric: She has a normal mood and affect.          Assessment & Plan:   No problem-specific assessment & plan notes found for this encounter.

## 2011-06-05 NOTE — Assessment & Plan Note (Signed)
BP at goal on Cardizem which has also helped her migraines.  Will continue, RFd today.  Keep up the good work with healthy diet and regular exercise.  F/u in 6 mos.

## 2011-06-05 NOTE — Patient Instructions (Signed)
Labs today. Will call you w/ results Monday.  Stay on current meds and keep up the good work with running!  Return for f/u in 6 mos.

## 2011-06-05 NOTE — Assessment & Plan Note (Signed)
Will update fasting labs today.  Hx of low HDL, not on anything.  Working on regular exercise.

## 2011-06-06 ENCOUNTER — Telehealth: Payer: Self-pay | Admitting: Family Medicine

## 2011-06-06 LAB — COMPLETE METABOLIC PANEL WITH GFR
AST: 21 U/L (ref 0–37)
Albumin: 4.3 g/dL (ref 3.5–5.2)
BUN: 16 mg/dL (ref 6–23)
Calcium: 9.6 mg/dL (ref 8.4–10.5)
Chloride: 109 mEq/L (ref 96–112)
GFR, Est Non African American: >60 mL/min (ref >60)
Glucose, Bld: 90 mg/dL (ref 70–99)
Potassium: 4.1 mEq/L (ref 3.5–5.3)
Sodium: 141 mEq/L (ref 135–145)
Total Protein: 6.9 g/dL (ref 6.0–8.3)

## 2011-06-06 NOTE — Telephone Encounter (Signed)
Pls let pt know that her blood counts, fasting sugar, liver and kidney function came back normal.  Cholesterol is normal other than an HDL at 34.  This should be >49 in women.  Add Omega 3 Fish Oil 3-4 grams/ day.

## 2011-06-08 NOTE — Telephone Encounter (Signed)
Pt aware.

## 2011-08-10 LAB — CREATININE, SERUM
Creatinine, Ser: 0.68
GFR calc Af Amer: >60
GFR calc non Af Amer: >60

## 2011-08-24 LAB — BASIC METABOLIC PANEL
BUN: 11
Calcium: 9.2
GFR calc non Af Amer: >60
Glucose, Bld: 118 — ABNORMAL HIGH
Potassium: 3.9

## 2011-08-24 LAB — APTT: aPTT: 30

## 2011-08-24 LAB — CBC
HCT: 43
Platelets: 240
RDW: 13.9
WBC: 10.3

## 2011-08-24 LAB — PROTIME-INR
INR: 1.1
Prothrombin Time: 14.2

## 2011-10-21 ENCOUNTER — Encounter: Payer: Self-pay | Admitting: Emergency Medicine

## 2011-10-21 ENCOUNTER — Emergency Department
Admission: EM | Admit: 2011-10-21 | Discharge: 2011-10-21 | Disposition: A | Discharge status: Home / Self Care | Payer: 59 | Source: Home / Self Care | Attending: Family Medicine | Admitting: Family Medicine

## 2011-10-21 DIAGNOSIS — R6889 Other general symptoms and signs: Secondary | ICD-10-CM

## 2011-10-21 DIAGNOSIS — J111 Influenza due to unidentified influenza virus with other respiratory manifestations: Secondary | ICD-10-CM

## 2011-10-21 MED ORDER — BENZONATATE 200 MG PO CAPS: 200.0000 mg | ORAL_CAPSULE | Freq: Every day | ORAL | Status: AC

## 2011-10-21 MED ORDER — OSELTAMIVIR PHOSPHATE 75 MG PO CAPS: 75.0000 mg | ORAL_CAPSULE | Freq: Two times a day (BID) | ORAL | Status: AC

## 2011-10-21 NOTE — ED Provider Notes (Signed)
History     CSN: [id number] Arrival date & time: 10/21/2011  2:09 PM   First MD Initiated Contact with Patient 10/21/11 1438      Chief Complaint  Patient presents with  . Influenza      HPI Comments: HPI : Flu symptoms for about 2 days. Fever to 100 with chills, sweats, myalgias, fatigue, headache. Symptoms are progressively worsening, despite trying OTC fever reducing medicine and rest and fluids. Has decreased appetite, but tolerating some liquids by mouth.  She has had a flu shot this season.  Review of Systems: Positive for fatigue, mild nasal congestion, mild sore throat, mild swollen anterior neck glands, mild cough. Negative for acute vision changes, stiff neck, focal weakness, syncope, seizures, respiratory distress, vomiting, diarrhea, GU symptoms.   Patient is a 30 y.o. female presenting with flu symptoms. The history is provided by the patient.  Influenza    Past Medical History  Diagnosis Date  . Hypertension   . AVM (arteriovenous malformation)     leaky  . Migraines     Past Surgical History  Procedure Date  . Emobilization   . Craniotomy     Family History  Problem Relation Age of Onset  . Hypertension Father   . Alcohol abuse Father     History  Substance Use Topics  . Smoking status: Never Smoker   . Smokeless tobacco: Not on file  . Alcohol Use: No    OB History    Grav Para Term Preterm Abortions TAB SAB Ect Mult Living                  Review of Systems  Allergies  Codeine; Lisinopril; and Sulfonamide derivatives  Home Medications   Current Outpatient Rx  Name Route Sig Dispense Refill  . DILTIAZEM HCL 120 MG PO TABS Oral Take 120 mg by mouth 4 (four) times daily.      [name] BENZONATATE 200 MG PO CAPS Oral Take 1 capsule (200 mg total) by mouth at bedtime. Take as needed for cough 12 capsule 0  . CETIRIZINE HCL 10 MG PO TABS Oral Take 10 mg by mouth daily.      [name] MINOCYCLINE HCL 100 MG PO CAPS Oral Take 100 mg by mouth daily.       . OSELTAMIVIR PHOSPHATE 75 MG PO CAPS Oral Take 1 capsule (75 mg total) by mouth every 12 (twelve) hours. 10 capsule 0    BP 121/82  Pulse 119  Temp(Src) 99.6 F (37.6 C) (Oral)  Resp 18  Ht 5\' 3"  (1.6 m)  Wt 166 lb (75.297 kg)  BMI 29.41 kg/m2  SpO2 99%  Physical Exam Nursing notes and Vital Signs reviewed. Appearance:  Patient appears healthy, stated age, and in no acute distress Eyes:  Pupils are equal, round, and reactive to light and accomodation.  Extraocular movement is intact.  Conjunctivae are not inflamed  Ears:  Canals normal.  Tympanic membranes normal.  Nose:  Mildly congested turbinates.  No sinus tenderness.   Pharynx:  Normal Neck:  Supple.  Slightly tender shotty anterior/posterior nodes are palpated bilaterally  Lungs:  Clear to auscultation.  Breath sounds are equal.  Heart:  Regular rate and rhythm without murmurs, rubs, or gallops.  Abdomen:  Nontender without masses or hepatosplenomegaly.  Bowel sounds are present.  No CVA or flank tenderness.  Extremities:  No edema.  No calf tenderness Skin:  No rash present.   ED Course  Procedures  none  1. Influenza-like illness       MDM  Begin Tamiflu, and cough suppressant at bedtime. Take Mucinex (guaifenesin) twice daily for congestion.  Increase fluid intake, rest. May use Afrin nasal spray (or generic oxymetazoline) twice daily for about 5 days.  Also recommend using saline nasal spray several times daily and/or saline nasal irrigation. Stop all antihistamines for now, and other non-prescription cough/cold preparations. May take Ibuprofen 200mg , 4 tabs every 8 hours with food for fever, body aches, etc. Follow-up with family doctor if not improving 7 to 10 days.      Donna Christen, MD 10/21/11 [phone]

## 2011-10-21 NOTE — ED Notes (Signed)
Cough, body aches, sore throat, runny nose since yesterday

## 2011-12-02 ENCOUNTER — Encounter: Payer: Self-pay | Admitting: Family

## 2011-12-02 ENCOUNTER — Ambulatory Visit (INDEPENDENT_AMBULATORY_CARE_PROVIDER_SITE_OTHER): Payer: 59 | Admitting: Family

## 2011-12-02 DIAGNOSIS — J329 Chronic sinusitis, unspecified: Secondary | ICD-10-CM | POA: Insufficient documentation

## 2011-12-02 DIAGNOSIS — I10 Essential (primary) hypertension: Secondary | ICD-10-CM

## 2011-12-02 MED ORDER — AMOXICILLIN-POT CLAVULANATE 875-125 MG PO TABS: 1.0000 | ORAL_TABLET | Freq: Two times a day (BID) | ORAL | Status: AC

## 2011-12-02 MED ORDER — DILTIAZEM HCL 120 MG PO TABS: 120.0000 mg | ORAL_TABLET | Freq: Every day | ORAL | Status: DC

## 2011-12-02 NOTE — Patient Instructions (Signed)
Please follow up in 3 months for a fasting physical.  

## 2011-12-02 NOTE — Progress Notes (Signed)
Subjective:    Patient ID: Kaitlin Foster, female    DOB: 27-Mar-1981, 31 y.o.   MRN: [id number]  HPI  Kaitlin Foster is a 31 yr old female who has been followed by Dr. Seymour Bars who wishes to establish at this location.  1) HTN-  She reports that she has been on atenolol, lisinopril- cough.    2) Headaches-  Usually near her incision.  Reports that these are mild and intermittent.    3) Sinus pressure- she reports that she had flu a month ago.  She has had some associated nasal congestion. Several days ago she developed pressure.  Zyrtec not helping.  Nasal drainage is clear.  Denies associated fever.  Sinus pressure is worsening.   Sees Dr.  Billy Coast for GYN/Paps.  Review of Systems See HPI  Past Medical History  Diagnosis Date  . Hypertension   . AVM (arteriovenous malformation)     leaky  . Migraines     History   Social History  . Marital Status: Married    Spouse Name: N/A    Number of Children: N/A  . Years of Education: N/A   Occupational History  . Not on file.   Social History Main Topics  . Smoking status: Never Smoker   . Smokeless tobacco: Never Used  . Alcohol Use: No  . Drug Use: Not on file  . Sexually Active: Yes    Birth Control/ Protection: Condom   Other Topics Concern  . Not on file   Social History Narrative   Regular exercise: 2 x weeklyCaffeine use:  1 coffee daily    Past Surgical History  Procedure Date  . Emobilization   . Craniotomy 2008    left frontal AVM    Family History  Problem Relation Age of Onset  . Hypertension Father   . Alcohol abuse Father   . Arthritis Father   . Hypertension Mother   . Arthritis Mother   . Cancer Neg Hx   . Heart disease Neg Hx     Allergies  Allergen Reactions  . Codeine     REACTION: upset stomach once, but has taken it other times and been ok  . Lisinopril   . Sulfonamide Derivatives     Current Outpatient Prescriptions on File Prior to Visit  Medication Sig Dispense Refill  .  cetirizine (ZYRTEC) 10 MG tablet Take 10 mg by mouth daily as needed.       . minocycline (MINOCIN,DYNACIN) 100 MG capsule Take 100 mg by mouth daily as needed.         BP 134/94  Pulse 85  Temp(Src) 97.8 F (36.6 C) (Oral)  Resp 20  Wt 170 lb (77.111 kg)  SpO2 98%  LMP 11/25/2011       Objective:   Physical Exam  Constitutional: She appears well-developed and well-nourished. No distress.  HENT:  Right Ear: Tympanic membrane and ear canal normal.  Mouth/Throat: No oropharyngeal exudate, posterior oropharyngeal edema or posterior oropharyngeal erythema.       L TM is dull, no erythema/bulging.   + frontal and maxillary sinus tenderness to palpation.  Cardiovascular: Normal rate and regular rhythm.   No murmur heard. Pulmonary/Chest: Effort normal and breath sounds normal. No respiratory distress. She has no wheezes. She has no rales. She exhibits no tenderness.  Musculoskeletal: She exhibits no edema.  Neurological:       Mild left sided facial asymmetry  Psychiatric: She has a normal mood and affect. Her  behavior is normal. Judgment and thought content normal.          Assessment & Plan:   BP Readings from Last 3 Encounters:  12/02/11 134/94  10/21/11 121/82  06/05/11 122/79

## 2011-12-02 NOTE — Assessment & Plan Note (Signed)
Will plan to treat with Augmentin.   

## 2011-12-02 NOTE — Assessment & Plan Note (Addendum)
DBP is slightly elevated today.  Last two readings have been great.   Will plan to continue current dose of cardizem and repeat in 3 months.  If still borderline, plan to add HCTZ.

## 2012-02-29 ENCOUNTER — Encounter: Payer: Self-pay | Admitting: Family

## 2012-02-29 ENCOUNTER — Ambulatory Visit (INDEPENDENT_AMBULATORY_CARE_PROVIDER_SITE_OTHER): Payer: 59 | Admitting: Family

## 2012-02-29 DIAGNOSIS — F411 Generalized anxiety disorder: Secondary | ICD-10-CM

## 2012-02-29 DIAGNOSIS — Z0000 Encounter for general adult medical examination without abnormal findings: Secondary | ICD-10-CM | POA: Insufficient documentation

## 2012-02-29 DIAGNOSIS — F329 Major depressive disorder, single episode, unspecified: Secondary | ICD-10-CM | POA: Insufficient documentation

## 2012-02-29 DIAGNOSIS — F419 Anxiety disorder, unspecified: Secondary | ICD-10-CM

## 2012-02-29 DIAGNOSIS — F32A Depression, unspecified: Secondary | ICD-10-CM | POA: Insufficient documentation

## 2012-02-29 LAB — CBC
HCT: 44.5 % (ref 36.0–46.0)
Hemoglobin: 14.5 g/dL (ref 12.0–15.0)
MCH: 28.8 pg (ref 26.0–34.0)
MCHC: 32.6 g/dL (ref 30.0–36.0)
MCV: 88.3 fL (ref 78.0–100.0)
Platelets: 211 K/uL (ref 150–400)
RBC: 5.04 MIL/uL (ref 3.87–5.11)
RDW: 13.7 % (ref 11.5–15.5)
WBC: 5 K/uL (ref 4.0–10.5)

## 2012-02-29 LAB — HEPATIC FUNCTION PANEL
ALT: 11 U/L (ref 0–35)
Alkaline Phosphatase: 74 U/L (ref 39–117)
Bilirubin, Direct: 0.1 mg/dL (ref 0.0–0.3)
Indirect Bilirubin: 0.4 mg/dL (ref 0.0–0.9)
Total Protein: 6.5 g/dL (ref 6.0–8.3)

## 2012-02-29 LAB — LIPID PANEL
Cholesterol: 157 mg/dL (ref 0–200)
HDL: 23 mg/dL — ABNORMAL LOW
LDL Cholesterol: 118 mg/dL — ABNORMAL HIGH (ref 0–99)
Total CHOL/HDL Ratio: 6.8 ratio
Triglycerides: 82 mg/dL
VLDL: 16 mg/dL (ref 0–40)

## 2012-02-29 LAB — BASIC METABOLIC PANEL WITHOUT GFR
BUN: 17 mg/dL (ref 6–23)
CO2: 27 meq/L (ref 19–32)
Calcium: 9.7 mg/dL (ref 8.4–10.5)
Chloride: 106 meq/L (ref 96–112)
Creat: 0.78 mg/dL (ref 0.50–1.10)
GFR, Est African American: >89 mL/min
GFR, Est Non African American: >89 mL/min
Glucose, Bld: 90 mg/dL (ref 70–99)
Potassium: 4.1 meq/L (ref 3.5–5.3)
Sodium: 141 meq/L (ref 135–145)

## 2012-02-29 LAB — TSH: TSH: 1.073 u[IU]/mL (ref 0.350–4.500)

## 2012-02-29 MED ORDER — CITALOPRAM HYDROBROMIDE 20 MG PO TABS: 20.0000 mg | ORAL_TABLET | Freq: Every day | ORAL | Status: DC

## 2012-02-29 NOTE — Patient Instructions (Signed)
Preventive Care for Adults, Female A healthy lifestyle and preventive care can promote health and wellness. Preventive health guidelines for women include the following key practices.  A routine yearly physical is a good way to check with your caregiver about your health and preventive screening. It is a chance to share any concerns and updates on your health, and to receive a thorough exam.   Visit your dentist for a routine exam and preventive care every 6 months. Brush your teeth twice a day and floss once a day. Good oral hygiene prevents tooth decay and gum disease.   The frequency of eye exams is based on your age, health, family medical history, use of contact lenses, and other factors. Follow your caregiver's recommendations for frequency of eye exams.   Eat a healthy diet. Foods like vegetables, fruits, whole grains, low-fat dairy products, and lean protein foods contain the nutrients you need without too many calories. Decrease your intake of foods high in solid fats, added sugars, and salt. Eat the right amount of calories for you.Get information about a proper diet from your caregiver, if necessary.   Regular physical exercise is one of the most important things you can do for your health. Most adults should get at least 150 minutes of moderate-intensity exercise (any activity that increases your heart rate and causes you to sweat) each week. In addition, most adults need muscle-strengthening exercises on 2 or more days a week.   Maintain a healthy weight. The body mass index (BMI) is a screening tool to identify possible weight problems. It provides an estimate of body fat based on height and weight. Your caregiver can help determine your BMI, and can help you achieve or maintain a healthy weight.For adults 20 years and older:   A BMI below 18.5 is considered underweight.   A BMI of 18.5 to 24.9 is normal.   A BMI of 25 to 29.9 is considered overweight.   A BMI of 30 and above is  considered obese.   Maintain normal blood lipids and cholesterol levels by exercising and minimizing your intake of saturated fat. Eat a balanced diet with plenty of fruit and vegetables. Blood tests for lipids and cholesterol should begin at age 20 and be repeated every 5 years. If your lipid or cholesterol levels are high, you are over 50, or you are at high risk for heart disease, you may need your cholesterol levels checked more frequently.Ongoing high lipid and cholesterol levels should be treated with medicines if diet and exercise are not effective.   If you smoke, find out from your caregiver how to quit. If you do not use tobacco, do not start.   If you are pregnant, do not drink alcohol. If you are breastfeeding, be very cautious about drinking alcohol. If you are not pregnant and choose to drink alcohol, do not exceed 1 drink per day. One drink is considered to be 12 ounces (355 mL) of beer, 5 ounces (148 mL) of wine, or 1.5 ounces (44 mL) of liquor.   Avoid use of street drugs. Do not share needles with anyone. Ask for help if you need support or instructions about stopping the use of drugs.   High blood pressure causes heart disease and increases the risk of stroke. Your blood pressure should be checked at least every 1 to 2 years. Ongoing high blood pressure should be treated with medicines if weight loss and exercise are not effective.   If you are 55 to 30   years old, ask your caregiver if you should take aspirin to prevent strokes.   Diabetes screening involves taking a blood sample to check your fasting blood sugar level. This should be done once every 3 years, after age 45, if you are within normal weight and without risk factors for diabetes. Testing should be considered at a younger age or be carried out more frequently if you are overweight and have at least 1 risk factor for diabetes.   Breast cancer screening is essential preventive care for women. You should practice "breast  self-awareness." This means understanding the normal appearance and feel of your breasts and may include breast self-examination. Any changes detected, no matter how small, should be reported to a caregiver. Women in their 20s and 30s should have a clinical breast exam (CBE) by a caregiver as part of a regular health exam every 1 to 3 years. After age 40, women should have a CBE every year. Starting at age 40, women should consider having a mammography (breast X-ray test) every year. Women who have a family history of breast cancer should talk to their caregiver about genetic screening. Women at a high risk of breast cancer should talk to their caregivers about having magnetic resonance imaging (MRI) and a mammography every year.   The Pap test is a screening test for cervical cancer. A Pap test can show cell changes on the cervix that might become cervical cancer if left untreated. A Pap test is a procedure in which cells are obtained and examined from the lower end of the uterus (cervix).   Women should have a Pap test starting at age 21.   Between ages 21 and 29, Pap tests should be repeated every 2 years.   Beginning at age 30, you should have a Pap test every 3 years as long as the past 3 Pap tests have been normal.   Some women have medical problems that increase the chance of getting cervical cancer. Talk to your caregiver about these problems. It is especially important to talk to your caregiver if a new problem develops soon after your last Pap test. In these cases, your caregiver may recommend more frequent screening and Pap tests.   The above recommendations are the same for women who have or have not gotten the vaccine for human papillomavirus (HPV).   If you had a hysterectomy for a problem that was not cancer or a condition that could lead to cancer, then you no longer need Pap tests. Even if you no longer need a Pap test, a regular exam is a good idea to make sure no other problems are  starting.   If you are between ages 65 and 70, and you have had normal Pap tests going back 10 years, you no longer need Pap tests. Even if you no longer need a Pap test, a regular exam is a good idea to make sure no other problems are starting.   If you have had past treatment for cervical cancer or a condition that could lead to cancer, you need Pap tests and screening for cancer for at least 20 years after your treatment.   If Pap tests have been discontinued, risk factors (such as a new sexual partner) need to be reassessed to determine if screening should be resumed.   The HPV test is an additional test that may be used for cervical cancer screening. The HPV test looks for the virus that can cause the cell changes on the cervix.   The cells collected during the Pap test can be tested for HPV. The HPV test could be used to screen women aged 30 years and older, and should be used in women of any age who have unclear Pap test results. After the age of 30, women should have HPV testing at the same frequency as a Pap test.   Colorectal cancer can be detected and often prevented. Most routine colorectal cancer screening begins at the age of 50 and continues through age 75. However, your caregiver may recommend screening at an earlier age if you have risk factors for colon cancer. On a yearly basis, your caregiver may provide home test kits to check for hidden blood in the stool. Use of a small camera at the end of a tube, to directly examine the colon (sigmoidoscopy or colonoscopy), can detect the earliest forms of colorectal cancer. Talk to your caregiver about this at age 50, when routine screening begins. Direct examination of the colon should be repeated every 5 to 10 years through age 75, unless early forms of pre-cancerous polyps or small growths are found.   Hepatitis C blood testing is recommended for all people born from 1945 through 1965 and any individual with known risks for hepatitis C.    Practice safe sex. Use condoms and avoid high-risk sexual practices to reduce the spread of sexually transmitted infections (STIs). STIs include gonorrhea, chlamydia, syphilis, trichomonas, herpes, HPV, and human immunodeficiency virus (HIV). Herpes, HIV, and HPV are viral illnesses that have no cure. They can result in disability, cancer, and death. Sexually active women aged 25 and younger should be checked for chlamydia. Older women with new or multiple partners should also be tested for chlamydia. Testing for other STIs is recommended if you are sexually active and at increased risk.   Osteoporosis is a disease in which the bones lose minerals and strength with aging. This can result in serious bone fractures. The risk of osteoporosis can be identified using a bone density scan. Women ages 65 and over and women at risk for fractures or osteoporosis should discuss screening with their caregivers. Ask your caregiver whether you should take a calcium supplement or vitamin D to reduce the rate of osteoporosis.   Menopause can be associated with physical symptoms and risks. Hormone replacement therapy is available to decrease symptoms and risks. You should talk to your caregiver about whether hormone replacement therapy is right for you.   Use sunscreen with sun protection factor (SPF) of 30 or more. Apply sunscreen liberally and repeatedly throughout the day. You should seek shade when your shadow is shorter than you. Protect yourself by wearing long sleeves, pants, a wide-brimmed hat, and sunglasses year round, whenever you are outdoors.   Once a month, do a whole body skin exam, using a mirror to look at the skin on your back. Notify your caregiver of new moles, moles that have irregular borders, moles that are larger than a pencil eraser, or moles that have changed in shape or color.   Stay current with required immunizations.   Influenza. You need a dose every fall (or winter). The composition of  the flu vaccine changes each year, so being vaccinated once is not enough.   Pneumococcal polysaccharide. You need 1 to 2 doses if you smoke cigarettes or if you have certain chronic medical conditions. You need 1 dose at age 65 (or older) if you have never been vaccinated.   Tetanus, diphtheria, pertussis (Tdap, Td). Get 1 dose of   Tdap vaccine if you are younger than age 65, are over 65 and have contact with an infant, are a healthcare worker, are pregnant, or simply want to be protected from whooping cough. After that, you need a Td booster dose every 10 years. Consult your caregiver if you have not had at least 3 tetanus and diphtheria-containing shots sometime in your life or have a deep or dirty wound.   HPV. You need this vaccine if you are a woman age 26 or younger. The vaccine is given in 3 doses over 6 months.   Measles, mumps, rubella (MMR). You need at least 1 dose of MMR if you were born in 1957 or later. You may also need a second dose.   Meningococcal. If you are age 19 to 21 and a first-year college student living in a residence hall, or have one of several medical conditions, you need to get vaccinated against meningococcal disease. You may also need additional booster doses.   Zoster (shingles). If you are age 60 or older, you should get this vaccine.   Varicella (chickenpox). If you have never had chickenpox or you were vaccinated but received only 1 dose, talk to your caregiver to find out if you need this vaccine.   Hepatitis A. You need this vaccine if you have a specific risk factor for hepatitis A virus infection or you simply wish to be protected from this disease. The vaccine is usually given as 2 doses, 6 to 18 months apart.   Hepatitis B. You need this vaccine if you have a specific risk factor for hepatitis B virus infection or you simply wish to be protected from this disease. The vaccine is given in 3 doses, usually over 6 months.  Preventive Services /  Frequency Ages 19 to 39  Blood pressure check.** / Every 1 to 2 years.   Lipid and cholesterol check.** / Every 5 years beginning at age 20.   Clinical breast exam.** / Every 3 years for women in their 20s and 30s.   Pap test.** / Every 2 years from ages 21 through 29. Every 3 years starting at age 30 through age 65 or 70 with a history of 3 consecutive normal Pap tests.   HPV screening.** / Every 3 years from ages 30 through ages 65 to 70 with a history of 3 consecutive normal Pap tests.   Hepatitis C blood test.** / For any individual with known risks for hepatitis C.   Skin self-exam. / Monthly.   Influenza immunization.** / Every year.   Pneumococcal polysaccharide immunization.** / 1 to 2 doses if you smoke cigarettes or if you have certain chronic medical conditions.   Tetanus, diphtheria, pertussis (Tdap, Td) immunization. / A one-time dose of Tdap vaccine. After that, you need a Td booster dose every 10 years.   HPV immunization. / 3 doses over 6 months, if you are 26 and younger.   Measles, mumps, rubella (MMR) immunization. / You need at least 1 dose of MMR if you were born in 1957 or later. You may also need a second dose.   Meningococcal immunization. / 1 dose if you are age 19 to 21 and a first-year college student living in a residence hall, or have one of several medical conditions, you need to get vaccinated against meningococcal disease. You may also need additional booster doses.   Varicella immunization.** / Consult your caregiver.   Hepatitis A immunization.** / Consult your caregiver. 2 doses, 6 to 18 months   apart.   Hepatitis B immunization.** / Consult your caregiver. 3 doses usually over 6 months.  Ages 40 to 64  Blood pressure check.** / Every 1 to 2 years.   Lipid and cholesterol check.** / Every 5 years beginning at age 20.   Clinical breast exam.** / Every year after age 40.   Mammogram.** / Every year beginning at age 40 and continuing for as  long as you are in good health. Consult with your caregiver.   Pap test.** / Every 3 years starting at age 30 through age 65 or 70 with a history of 3 consecutive normal Pap tests.   HPV screening.** / Every 3 years from ages 30 through ages 65 to 70 with a history of 3 consecutive normal Pap tests.   Fecal occult blood test (FOBT) of stool. / Every year beginning at age 50 and continuing until age 75. You may not need to do this test if you get a colonoscopy every 10 years.   Flexible sigmoidoscopy or colonoscopy.** / Every 5 years for a flexible sigmoidoscopy or every 10 years for a colonoscopy beginning at age 50 and continuing until age 75.   Hepatitis C blood test.** / For all people born from 1945 through 1965 and any individual with known risks for hepatitis C.   Skin self-exam. / Monthly.   Influenza immunization.** / Every year.   Pneumococcal polysaccharide immunization.** / 1 to 2 doses if you smoke cigarettes or if you have certain chronic medical conditions.   Tetanus, diphtheria, pertussis (Tdap, Td) immunization.** / A one-time dose of Tdap vaccine. After that, you need a Td booster dose every 10 years.   Measles, mumps, rubella (MMR) immunization. / You need at least 1 dose of MMR if you were born in 1957 or later. You may also need a second dose.   Varicella immunization.** / Consult your caregiver.   Meningococcal immunization.** / Consult your caregiver.   Hepatitis A immunization.** / Consult your caregiver. 2 doses, 6 to 18 months apart.   Hepatitis B immunization.** / Consult your caregiver. 3 doses, usually over 6 months.  Ages 65 and over  Blood pressure check.** / Every 1 to 2 years.   Lipid and cholesterol check.** / Every 5 years beginning at age 20.   Clinical breast exam.** / Every year after age 40.   Mammogram.** / Every year beginning at age 40 and continuing for as long as you are in good health. Consult with your caregiver.   Pap test.** /  Every 3 years starting at age 30 through age 65 or 70 with a 3 consecutive normal Pap tests. Testing can be stopped between 65 and 70 with 3 consecutive normal Pap tests and no abnormal Pap or HPV tests in the past 10 years.   HPV screening.** / Every 3 years from ages 30 through ages 65 or 70 with a history of 3 consecutive normal Pap tests. Testing can be stopped between 65 and 70 with 3 consecutive normal Pap tests and no abnormal Pap or HPV tests in the past 10 years.   Fecal occult blood test (FOBT) of stool. / Every year beginning at age 50 and continuing until age 75. You may not need to do this test if you get a colonoscopy every 10 years.   Flexible sigmoidoscopy or colonoscopy.** / Every 5 years for a flexible sigmoidoscopy or every 10 years for a colonoscopy beginning at age 50 and continuing until age 75.   Hepatitis   C blood test.** / For all people born from 1945 through 1965 and any individual with known risks for hepatitis C.   Osteoporosis screening.** / A one-time screening for women ages 65 and over and women at risk for fractures or osteoporosis.   Skin self-exam. / Monthly.   Influenza immunization.** / Every year.   Pneumococcal polysaccharide immunization.** / 1 dose at age 65 (or older) if you have never been vaccinated.   Tetanus, diphtheria, pertussis (Tdap, Td) immunization. / A one-time dose of Tdap vaccine if you are over 65 and have contact with an infant, are a healthcare worker, or simply want to be protected from whooping cough. After that, you need a Td booster dose every 10 years.   Varicella immunization.** / Consult your caregiver.   Meningococcal immunization.** / Consult your caregiver.   Hepatitis A immunization.** / Consult your caregiver. 2 doses, 6 to 18 months apart.   Hepatitis B immunization.** / Check with your caregiver. 3 doses, usually over 6 months.  ** Family history and personal history of risk and conditions may change your caregiver's  recommendations. Document Released: 12/22/2001 Document Revised: 10/15/2011 Document Reviewed: 03/23/2011 [employer] Patient Information 2012 [employer]. 

## 2012-02-29 NOTE — Assessment & Plan Note (Signed)
Immunizations up to date. Commended pt on her weight loss.  Encouraged regular exercise.

## 2012-02-29 NOTE — Assessment & Plan Note (Signed)
Will give trial of citalopram.  I instructed pt to start 1/2 tablet once daily for 1 week and then increase to a full tablet once daily on week two as tolerated.  We discussed common side effects such as nausea, drowsiness and weight gain.  Pt verbalizes understanding.  Plan follow up in 1 month to evaluate progress.

## 2012-02-29 NOTE — Progress Notes (Signed)
Subjective:    Patient ID: Kaitlin Foster, female    DOB: 1981/06/01, 31 y.o.   MRN: [id number]  HPI  Kaitlin Foster is a 31 yr old female who presents today for her complete physical.   Anxiety- She reports concerns about anxiety.  Son is 2 1/2 yrs old.  He is deaf in 1 ear and was premature. He is having some issues and this is stressful for her.  She reports that she is often very tense due to anxiety and this causes her headaches.  She reports occasional panic attacks.  She is interested in trying something for anxiety to see if it can help.  Preventative- not exercising, doing weight watchers- has lost 8 pounds recently.  Reports tetananus at [facility] < 10 yrs ago. Up to date on pap- last week, normal.    Review of Systems  Constitutional: Negative for fever.  HENT: Positive for rhinorrhea. Negative for hearing loss.   Eyes: Negative for visual disturbance.  Respiratory: Negative for cough.   Cardiovascular: Negative for chest pain.  Gastrointestinal: Negative for nausea, vomiting and diarrhea.  Genitourinary: Negative for menstrual problem.  Musculoskeletal: Positive for arthralgias.       Mild left shoulder soreness x 1 week.  Chronic mild knee pain  Skin: Negative for rash.  Neurological: Positive for headaches.       Tension headaches.  ?migraines.    Hematological: Negative for adenopathy.  Psychiatric/Behavioral:       + anxiety, denies depression   Past Medical History  Diagnosis Date  . Hypertension   . AVM (arteriovenous malformation)     leaky  . Migraines     History   Social History  . Marital Status: Married    Spouse Name: N/A    Number of Children: N/A  . Years of Education: N/A   Occupational History  . Not on file.   Social History Main Topics  . Smoking status: Never Smoker   . Smokeless tobacco: Never Used  . Alcohol Use: No  . Drug Use: Not on file  . Sexually Active: Yes    Birth Control/ Protection: Condom   Other Topics Concern  . Not on  file   Social History Narrative   Regular exercise: 2 x weeklyCaffeine use:  1 coffee daily    Past Surgical History  Procedure Date  . Emobilization   . Craniotomy 2008    left frontal AVM    Family History  Problem Relation Age of Onset  . Hypertension Father   . Alcohol abuse Father   . Arthritis Father   . Hypertension Mother   . Arthritis Mother   . Cancer Neg Hx   . Heart disease Neg Hx     Allergies  Allergen Reactions  . Codeine     REACTION: upset stomach once, but has taken it other times and been ok  . Lisinopril   . Sulfonamide Derivatives     Current Outpatient Prescriptions on File Prior to Visit  Medication Sig Dispense Refill  . cetirizine (ZYRTEC) 10 MG tablet Take 10 mg by mouth daily as needed.       . diltiazem (CARDIZEM) 120 MG tablet Take 1 tablet (120 mg total) by mouth daily.  90 tablet  1  . minocycline (MINOCIN,DYNACIN) 100 MG capsule Take 100 mg by mouth daily as needed.       . citalopram (CELEXA) 20 MG tablet Take 1 tablet (20 mg total) by mouth daily.  30  tablet  1    BP 118/80  Pulse 76  Temp(Src) 98.5 F (36.9 C) (Oral)  Resp 16  Ht 5' 3.75" (1.619 m)  Wt 162 lb 0.6 oz (73.501 kg)  BMI 28.03 kg/m2  SpO2 99%  LMP 02/23/2012        Objective:   Physical Exam  Physical Exam  Constitutional: She is oriented to person, place, and time. She appears well-developed and well-nourished. No distress.  HENT:  Head: Normocephalic and atraumatic.  Right Ear: Tympanic membrane and ear canal normal.  Left Ear: Tympanic membrane and ear canal normal.  Mouth/Throat: Oropharynx is clear and moist.  Eyes: Pupils are equal, round, and reactive to light. No scleral icterus.  Neck: Normal range of motion. No thyromegaly present.  Cardiovascular: Normal rate and regular rhythm.   No murmur heard. Pulmonary/Chest: Effort normal and breath sounds normal. No respiratory distress. He has no wheezes. She has no rales. She exhibits no tenderness.   Abdominal: Soft. Bowel sounds are normal. He exhibits no distension and no mass. There is no tenderness. There is no rebound and no guarding.  Musculoskeletal: She exhibits no edema.  Lymphadenopathy:    She has no cervical adenopathy.  Neurological: She is alert and oriented to person, place, and time. She has normal reflexes. She exhibits normal muscle tone. Coordination normal.  Skin: Skin is warm and dry.  Psychiatric: She has a normal mood and affect. Her behavior is normal. Judgment and thought content normal.  Pelvic/breast- deferred to GYN.         Assessment & Plan:         Assessment & Plan:

## 2012-03-01 ENCOUNTER — Encounter: Payer: Self-pay | Admitting: Family

## 2012-03-01 LAB — URINALYSIS, ROUTINE W REFLEX MICROSCOPIC
Bilirubin Urine: NEGATIVE
Glucose, UA: NEGATIVE mg/dL
Ketones, ur: NEGATIVE mg/dL
Protein, ur: NEGATIVE mg/dL
Specific Gravity, Urine: 1.021 (ref 1.005–1.030)
Urobilinogen, UA: 0.2 mg/dL (ref 0.0–1.0)

## 2012-03-02 ENCOUNTER — Encounter: Payer: Self-pay | Admitting: Family

## 2012-04-02 ENCOUNTER — Emergency Department
Admission: EM | Admit: 2012-04-02 | Discharge: 2012-04-02 | Disposition: A | Discharge status: Home / Self Care | Payer: 59 | Source: Home / Self Care

## 2012-04-02 ENCOUNTER — Encounter: Payer: Self-pay | Admitting: Emergency Medicine

## 2012-04-02 DIAGNOSIS — H6690 Otitis media, unspecified, unspecified ear: Secondary | ICD-10-CM

## 2012-04-02 DIAGNOSIS — J309 Allergic rhinitis, unspecified: Secondary | ICD-10-CM

## 2012-04-02 MED ORDER — AMOXICILLIN 500 MG PO CAPS: 1000.0000 mg | ORAL_CAPSULE | Freq: Three times a day (TID) | ORAL | Status: AC

## 2012-04-02 MED ORDER — CETIRIZINE HCL 10 MG PO CAPS: ORAL_CAPSULE | ORAL | Status: DC

## 2012-04-02 MED ORDER — FLUTICASONE PROPIONATE 50 MCG/ACT NA SUSP: NASAL | Status: DC

## 2012-04-02 NOTE — ED Provider Notes (Signed)
History     CSN: [id number]  Arrival date & time 04/02/12  1248   First MD Initiated Contact with Patient 04/02/12 1250      Chief Complaint  Patient presents with  . Facial Pain    (Consider location/radiation/quality/duration/timing/severity/associated sxs/prior treatment) HPI Comments: URI Symptoms Onset: 2 weeks  Description: sinus pressure, post nasal drip, sore throat, mild dizziness  Modifying factors:  none  Symptoms Nasal discharge: yes Fever: no Sore throat: mild Cough: mild Wheezing: no Ear pain: no GI symptoms: no Sick contacts: no  Red Flags  Stiff neck: no Dyspnea: no Rash: no Swallowing difficulty: no  Sinusitis Risk Factors Headache/face pain: yes Double sickening: no tooth pain: no  Allergy Risk Factors Sneezing: yes Itchy scratchy throat: yes Seasonal symptoms: yes  Flu Risk Factors Headache: mild muscle aches: no severe fatigue: no    The history is provided by the patient.    Past Medical History  Diagnosis Date  . Hypertension   . AVM (arteriovenous malformation)     leaky  . Migraines     Past Surgical History  Procedure Date  . Emobilization   . Craniotomy 2008    left frontal AVM    Family History  Problem Relation Age of Onset  . Hypertension Father   . Alcohol abuse Father   . Arthritis Father   . Hypertension Mother   . Arthritis Mother   . Cancer Neg Hx   . Heart disease Neg Hx     History  Substance Use Topics  . Smoking status: Never Smoker   . Smokeless tobacco: Never Used  . Alcohol Use: No    OB History    Grav Para Term Preterm Abortions TAB SAB Ect Mult Living                  Review of Systems  Constitutional: Negative for fever, chills and appetite change.  HENT: Positive for congestion, sore throat, sneezing and postnasal drip. Negative for trouble swallowing.   Respiratory: Negative for cough and wheezing.     Allergies  Codeine; Lisinopril; and Sulfonamide derivatives  Home  Medications   Current Outpatient Rx  Name Route Sig Dispense Refill  . AMOXICILLIN 500 MG PO CAPS Oral Take 2 capsules (1,000 mg total) by mouth 3 (three) times daily. 60 capsule 0  . CETIRIZINE HCL 10 MG PO TABS Oral Take 10 mg by mouth daily as needed.     [name] CETIRIZINE HCL 10 MG PO CAPS  1 tab by mouth daily 30 capsule 6  . CITALOPRAM HYDROBROMIDE 20 MG PO TABS Oral Take 1 tablet (20 mg total) by mouth daily. 30 tablet 1  . DILTIAZEM HCL 120 MG PO TABS Oral Take 1 tablet (120 mg total) by mouth daily. 90 tablet 1  . FLUTICASONE PROPIONATE 50 MCG/ACT NA SUSP  2 sprays into each nostril daily 16 g 2  . MINOCYCLINE HCL 100 MG PO CAPS Oral Take 100 mg by mouth daily as needed.       BP 145/87  Pulse 86  Temp(Src) 98.5 F (36.9 C) (Oral)  Resp 16  Ht 5\' 3"  (1.6 m)  Wt 160 lb (72.576 kg)  BMI 28.34 kg/m2  SpO2 100%  LMP 03/28/2012  Physical Exam  Constitutional: She appears well-developed and well-nourished.  HENT:  Head: Normocephalic and atraumatic.  Right Ear: External ear normal.       L TM bulging and erythema,  +nasal erythema, rhinorrhea bilaterally, + post oropharyngeal erythema  Eyes: Conjunctivae are normal. Pupils are equal, round, and reactive to light.  Neck: Normal range of motion. Neck supple.  Cardiovascular: Normal rate and regular rhythm.   Pulmonary/Chest: Effort normal. She has no wheezes.  Abdominal: Soft. Bowel sounds are normal.  Lymphadenopathy:    She has no cervical adenopathy.  Neurological: She is alert.       dix hallpike negative.     ED Course  Procedures (including critical care time)  Labs Reviewed - No data to display No results found.   No diagnosis found.    MDM  Allergic Rhinitis and AOM Zyrtec, flonase Amoxicllin Discussed infectious red flags  Handout given.     The patient and/or caregiver has been counseled thoroughly with regard to treatment plan and/or medications prescribed including dosage, schedule,  interactions, rationale for use, and possible side effects and they verbalize understanding. Diagnoses and expected course of recovery discussed and will return if not improved as expected or if the condition worsens. Patient and/or caregiver verbalized understanding.                Floydene Flock, MD 04/02/12 [phone]

## 2012-04-02 NOTE — ED Notes (Signed)
Episodic light headedness x 3-4 weeks; past 24 hour noticing facial/sinus pain.

## 2012-04-02 NOTE — Discharge Instructions (Signed)
Allergic Rhinitis Allergic rhinitis is when the mucous membranes in the nose respond to allergens. Allergens are particles in the air that cause your body to have an allergic reaction. This causes you to release allergic antibodies. Through a chain of events, these eventually cause you to release histamine into the blood stream (hence the use of antihistamines). Although meant to be protective to the body, it is this release that causes your discomfort, such as frequent sneezing, congestion and an itchy runny nose.  CAUSES  The pollen allergens may come from grasses, trees, and weeds. This is seasonal allergic rhinitis, or "hay fever." Other allergens cause year-round allergic rhinitis (perennial allergic rhinitis) such as house dust mite allergen, pet dander and mold spores.  SYMPTOMS   Nasal stuffiness (congestion).   Runny, itchy nose with sneezing and tearing of the eyes.   There is often an itching of the mouth, eyes and ears.  It cannot be cured, but it can be controlled with medications. DIAGNOSIS  If you are unable to determine the offending allergen, skin or blood testing may find it. TREATMENT   Avoid the allergen.   Medications and allergy shots (immunotherapy) can help.   Hay fever may often be treated with antihistamines in pill or nasal spray forms. Antihistamines block the effects of histamine. There are over-the-counter medicines that may help with nasal congestion and swelling around the eyes. Check with your caregiver before taking or giving this medicine.  If the treatment above does not work, there are many new medications your caregiver can prescribe. Stronger medications may be used if initial measures are ineffective. Desensitizing injections can be used if medications and avoidance fails. Desensitization is when a patient is given ongoing shots until the body becomes less sensitive to the allergen. Make sure you follow up with your caregiver if problems continue. SEEK  MEDICAL CARE IF:   You develop fever (more than 100.5 F (38.1 C).   You develop a cough that does not stop easily (persistent).   You have shortness of breath.   You start wheezing.   Symptoms interfere with normal daily activities.  Document Released: 07/21/2001 Document Revised: 10/15/2011 Document Reviewed: 01/30/2009 [facility] Patient Information 2012 [place], [place].  Otitis Media You or your child has otitis media. This is an infection of the middle chamber of the ear. This condition is common in young children and often follows upper respiratory infections. Symptoms of otitis media may include earache or ear fullness, hearing loss, or fever. If the eardrum ruptures, a middle ear infection may also cause bloody or pus-like discharge from the ear. Fussiness, irritability, and persistent crying may be the only signs of otitis media in small children. Otitis media can be caused by a bacteria or a virus. Antibiotics may be used to treat bacterial otitis media. But antibiotics are not effective against viral infections. Not every case of bacterial otitis media requires antibiotics and depending on age, severity of infection, and other risk factors, observation may be all that is required. Ear drops or oral medicines may be prescribed to reduce pain, fever, or congestion. Babies with ear infections should not be fed while lying on their backs. This increases the pressure and pain in the ear. Do not put cotton in the ear canal or clean it with cotton swabs. Swimming should be avoided if the eardrum has ruptured or if there is drainage from the ear canal. If your child experiences recurrent infections, your child may need to be referred to an Ear,  Nose, and Throat specialist. HOME CARE INSTRUCTIONS   Take any antibiotic as directed by your caregiver. You or your child may feel better in a few days, but take all medicine or the infection may not respond and may become more difficult to treat.    Only take over-the-counter or prescription medicines for pain, discomfort, or fever as directed by your caregiver. Do not give aspirin to children.  Otitis media can lead to complications including rupture of the eardrum, long-term hearing loss, and more severe infections. Call your caregiver for follow-up care at the end of treatment. SEEK IMMEDIATE MEDICAL CARE IF:   Your or your child's problems do not improve within 2 to 3 days.   You or your child has an oral temperature above 102 F (38.9 C), not controlled by medicine.   Your baby is older than 3 months with a rectal temperature of 102 F (38.9 C) or higher.   Your baby is 42 months old or younger with a rectal temperature of 100.4 F (38 C) or higher.   Your child develops increased fussiness.   You or your child develops a stiff neck, severe headache, or confusion.   There is swelling around the ear.   There is dizziness, vomiting, unusual sleepiness, seizures, or twitching of facial muscles.   The pain or ear drainage persists beyond 2 days of antibiotic treatment.  Document Released: 12/03/2004 Document Revised: 10/15/2011 Document Reviewed: 02/21/2010 [facility] Patient Information 2012 [place], [place].

## 2012-04-03 NOTE — ED Provider Notes (Signed)
Agree with exam, assessment, and plan.   Lorenso Quirino A Ikenna Ohms, MD 04/03/12 1235 

## 2012-04-14 ENCOUNTER — Encounter: Payer: Self-pay | Admitting: Family

## 2012-04-15 ENCOUNTER — Ambulatory Visit: Payer: 59 | Admitting: Family

## 2012-07-26 ENCOUNTER — Encounter: Payer: Self-pay | Admitting: Family

## 2012-07-26 ENCOUNTER — Other Ambulatory Visit: Payer: Self-pay | Admitting: Family

## 2012-07-26 ENCOUNTER — Ambulatory Visit (INDEPENDENT_AMBULATORY_CARE_PROVIDER_SITE_OTHER): Payer: 59 | Admitting: Family

## 2012-07-26 VITALS — BP 116/84 | HR 86 | Temp 98.7°F | Resp 16 | Ht 63.75 in | Wt 165.0 lb

## 2012-07-26 DIAGNOSIS — F411 Generalized anxiety disorder: Secondary | ICD-10-CM

## 2012-07-26 DIAGNOSIS — I10 Essential (primary) hypertension: Secondary | ICD-10-CM

## 2012-07-26 DIAGNOSIS — J329 Chronic sinusitis, unspecified: Secondary | ICD-10-CM | POA: Insufficient documentation

## 2012-07-26 DIAGNOSIS — F419 Anxiety disorder, unspecified: Secondary | ICD-10-CM

## 2012-07-26 MED ORDER — AMOXICILLIN-POT CLAVULANATE 875-125 MG PO TABS: 1.0000 | ORAL_TABLET | Freq: Two times a day (BID) | ORAL | Status: DC

## 2012-07-26 MED ORDER — DILTIAZEM HCL 120 MG PO TABS: 120.0000 mg | ORAL_TABLET | Freq: Every day | ORAL | Status: DC

## 2012-07-26 MED ORDER — FLUTICASONE PROPIONATE 50 MCG/ACT NA SUSP: NASAL | Status: DC

## 2012-07-26 NOTE — Progress Notes (Signed)
Subjective:    Patient ID: Kaitlin Foster, female    DOB: [birth year], 31 y.o.   MRN: [id number]  HPI  Kaitlin Foster is a 31 yr old female who presents today with report of dizzness episode yesterday.  Dizziness only lasted a few seconds.  Reports that she has + nasal congestion and pressure beneath her eyes.  Has been using zyrtec for allergy symptoms.    Anxiety- reports that she is not taking citalopram. Didn't think she needed.  Review of Systems See HPI  Past Medical History  Diagnosis Date  . Hypertension   . AVM (arteriovenous malformation)     leaky  . Migraines     History   Social History  . Marital Status: Married    Spouse Name: N/A    Number of Children: N/A  . Years of Education: N/A   Occupational History  . Not on file.   Social History Main Topics  . Smoking status: Never Smoker   . Smokeless tobacco: Never Used  . Alcohol Use: No  . Drug Use: Not on file  . Sexually Active: Yes    Birth Control/ Protection: Condom   Other Topics Concern  . Not on file   Social History Narrative   Regular exercise: 2 x weeklyCaffeine use:  1 coffee daily    Past Surgical History  Procedure Date  . Emobilization   . Craniotomy 2008    left frontal AVM    Family History  Problem Relation Age of Onset  . Hypertension Father   . Alcohol abuse Father   . Arthritis Father   . Hypertension Mother   . Arthritis Mother   . Cancer Neg Hx   . Heart disease Neg Hx     Allergies  Allergen Reactions  . Codeine     REACTION: upset stomach once, but has taken it other times and been ok  . Lisinopril   . Sulfonamide Derivatives     Current Outpatient Prescriptions on File Prior to Visit  Medication Sig Dispense Refill  . cetirizine (ZYRTEC) 10 MG tablet Take 10 mg by mouth daily as needed.       . Cetirizine HCl (ZYRTEC ALLERGY) 10 MG CAPS 1 tab by mouth daily  30 capsule  6  . minocycline (MINOCIN,DYNACIN) 100 MG capsule Take 100 mg by mouth daily as needed.        [name] DISCONTD: diltiazem (CARDIZEM) 120 MG tablet Take 1 tablet (120 mg total) by mouth daily.  90 tablet  1  . DISCONTD: fluticasone (FLONASE) 50 MCG/ACT nasal spray 2 sprays into each nostril daily  16 g  2    BP 116/84  Pulse 86  Temp 98.7 F (37.1 C) (Oral)  Resp 16  Ht 5' 3.75" (1.619 m)  Wt 165 lb 0.6 oz (74.862 kg)  BMI 28.55 kg/m2  SpO2 99%  LMP 07/05/2012       Objective:   Physical Exam  Constitutional: She is oriented to person, place, and time. She appears well-developed and well-nourished. No distress.  HENT:  Head: Normocephalic and atraumatic.       + frontal/maxillary sinus tenderness to palpation.   Eyes: No scleral icterus.  Cardiovascular: Normal rate and regular rhythm.   No murmur heard. Pulmonary/Chest: Effort normal and breath sounds normal. No respiratory distress. She has no wheezes. She has no rales. She exhibits no tenderness.  Neurological: She is alert and oriented to person, place, and time.  Psychiatric: She has a normal  mood and affect. Her behavior is normal. Judgment and thought content normal.          Assessment & Plan:

## 2012-07-26 NOTE — Assessment & Plan Note (Signed)
BP looks good on current dose of cardizem, continue same.

## 2012-07-26 NOTE — Assessment & Plan Note (Addendum)
Will rx with Augmentin.  Resume flonase.

## 2012-07-26 NOTE — Assessment & Plan Note (Signed)
Stable off of meds.  

## 2012-07-26 NOTE — Patient Instructions (Addendum)

## 2012-11-16 ENCOUNTER — Encounter: Payer: Self-pay | Admitting: Family

## 2012-11-18 ENCOUNTER — Encounter: Payer: Self-pay | Admitting: Family

## 2012-11-18 ENCOUNTER — Ambulatory Visit: Payer: Self-pay | Admitting: Family

## 2012-11-18 ENCOUNTER — Ambulatory Visit (INDEPENDENT_AMBULATORY_CARE_PROVIDER_SITE_OTHER): Payer: 59 | Admitting: Family

## 2012-11-18 VITALS — BP 116/80 | HR 94 | Temp 97.4°F | Resp 16 | Wt 168.1 lb

## 2012-11-18 DIAGNOSIS — R42 Dizziness and giddiness: Secondary | ICD-10-CM

## 2012-11-18 DIAGNOSIS — I10 Essential (primary) hypertension: Secondary | ICD-10-CM

## 2012-11-18 DIAGNOSIS — G43909 Migraine, unspecified, not intractable, without status migrainosus: Secondary | ICD-10-CM

## 2012-11-18 MED ORDER — SUMATRIPTAN SUCCINATE 50 MG PO TABS: ORAL_TABLET | ORAL | Status: DC

## 2012-11-18 NOTE — Assessment & Plan Note (Signed)
BP stable on cardizem. Continue same.

## 2012-11-18 NOTE — Progress Notes (Signed)
Subjective:    Patient ID: Kaitlin Foster, female    DOB: [birth year], 32 y.o.   MRN: [id number]  HPI  Kaitlin Foster is a 32 yr old female who presents today with chief complaint of dizziness/lightheadness.  Reports that she had dizzy episode 1 month ago after a few seconds.  Most recently she has felt "woozy" just for a second.  Generally at rest.  Comes and goes.  Reports her BP has  Been 117/70. She reports some sinus pressure/congestion.    Migraines- 3x a week.  Uses otc meds- excedrin.    Review of Systems See HPI  Past Medical History  Diagnosis Date  . Hypertension   . AVM (arteriovenous malformation)     leaky  . Migraines     History   Social History  . Marital Status: Married    Spouse Name: N/A    Number of Children: N/A  . Years of Education: N/A   Occupational History  . Not on file.   Social History Main Topics  . Smoking status: Never Smoker   . Smokeless tobacco: Never Used  . Alcohol Use: No  . Drug Use: Not on file  . Sexually Active: Yes    Birth Control/ Protection: Condom   Other Topics Concern  . Not on file   Social History Narrative   Regular exercise: 2 x weeklyCaffeine use:  1 coffee daily    Past Surgical History  Procedure Date  . Emobilization   . Craniotomy 2008    left frontal AVM    Family History  Problem Relation Age of Onset  . Hypertension Father   . Alcohol abuse Father   . Arthritis Father   . Hypertension Mother   . Arthritis Mother   . Cancer Neg Hx   . Heart disease Neg Hx     Allergies  Allergen Reactions  . Codeine     REACTION: upset stomach once, but has taken it other times and been ok  . Lisinopril   . Sulfonamide Derivatives     Current Outpatient Prescriptions on File Prior to Visit  Medication Sig Dispense Refill  . cetirizine (ZYRTEC) 10 MG tablet Take 10 mg by mouth daily as needed.       . diltiazem (CARDIZEM) 120 MG tablet Take 1 tablet (120 mg total) by mouth daily.  90 tablet  1  .  fluticasone (FLONASE) 50 MCG/ACT nasal spray 2 sprays into each nostril daily  16 g  2  . minocycline (MINOCIN,DYNACIN) 100 MG capsule Take 100 mg by mouth daily as needed.         BP 116/80  Pulse 94  Temp 97.4 F (36.3 C) (Oral)  Resp 16  Wt 168 lb 1.9 oz (76.259 kg)  SpO2 97%  LMP 11/16/2012       Objective:   Physical Exam  Constitutional: She appears well-developed and well-nourished. No distress.  HENT:  Head: Normocephalic and atraumatic.  Right Ear: Tympanic membrane and ear canal normal.  Left Ear: Tympanic membrane and ear canal normal.  Mouth/Throat: No posterior oropharyngeal edema or posterior oropharyngeal erythema.  Eyes: No scleral icterus.  Cardiovascular: Normal rate and regular rhythm.   No murmur heard. Pulmonary/Chest: Effort normal. No respiratory distress. She has no wheezes. She has no rales.  Musculoskeletal: She exhibits no edema.  Skin: Skin is warm and dry.  Psychiatric: She has a normal mood and affect. Her behavior is normal. Judgment and thought content normal.  Assessment & Plan:

## 2012-11-18 NOTE — Assessment & Plan Note (Signed)
Mild.  I suspect sinus congestion, not convinced that she has bacterial sinusitis.  Recommended nasal saline washes, resume flonase, continue zyrtec.  If symptoms worsen or do not improve, I have asked pt to contact me and will consider abx at that time.

## 2012-11-18 NOTE — Assessment & Plan Note (Signed)
Reports migraines 3 x a week.  Using excedrin migraine 3+ times a week. We discussed using excedrin sparingly. Trial of PRN imitrex.

## 2012-11-18 NOTE — Patient Instructions (Addendum)
Resume flonase daily.  Continue Zyrtec. Use nasal saline wash twice daily. Call if symptoms worsen or if not improved in 3-5 days.

## 2012-12-27 ENCOUNTER — Encounter: Payer: Self-pay | Admitting: Family

## 2012-12-27 NOTE — Telephone Encounter (Signed)
Diltiazem refill

## 2012-12-28 MED ORDER — DILTIAZEM HCL 120 MG PO TABS: 120.0000 mg | ORAL_TABLET | Freq: Every day | ORAL | Status: DC

## 2012-12-29 ENCOUNTER — Telehealth: Payer: Self-pay | Admitting: Family

## 2012-12-29 NOTE — Telephone Encounter (Signed)
Brett Canales from [facility] left message on nurse voicemail. He says that he received and e-scribe for Cardizem 120mg . He says patient has been on an extended release in past and would like to know if this new prescription should be as well?

## 2012-12-29 NOTE — Telephone Encounter (Signed)
Spoke with Brett Canales at [facility]. He states pt has been getting Cardizem CD 120mg . Even though Rx in 07/2012 was for cardizem it was filled with Cardizem CD. Which form should pt continue?

## 2012-12-30 MED ORDER — DILTIAZEM HCL ER COATED BEADS 120 MG PO CP24: 120.0000 mg | ORAL_CAPSULE | Freq: Every day | ORAL | Status: DC

## 2012-12-30 NOTE — Telephone Encounter (Signed)
Correction made to medication list and rx re-sent with correct form.

## 2012-12-30 NOTE — Telephone Encounter (Signed)
Cardizem CD 

## 2013-01-06 ENCOUNTER — Other Ambulatory Visit: Payer: Self-pay | Admitting: Family

## 2013-04-16 ENCOUNTER — Emergency Department
Admission: EM | Admit: 2013-04-16 | Discharge: 2013-04-16 | Disposition: A | Discharge status: Home / Self Care | Payer: 59 | Source: Home / Self Care | Attending: Family Medicine | Admitting: Family Medicine

## 2013-04-16 ENCOUNTER — Encounter: Payer: Self-pay | Admitting: Emergency Medicine

## 2013-04-16 DIAGNOSIS — J029 Acute pharyngitis, unspecified: Secondary | ICD-10-CM

## 2013-04-16 DIAGNOSIS — J020 Streptococcal pharyngitis: Secondary | ICD-10-CM

## 2013-04-16 MED ORDER — PENICILLIN V POTASSIUM 500 MG PO TABS: ORAL_TABLET | ORAL | Status: DC

## 2013-04-16 NOTE — ED Provider Notes (Signed)
History     CSN: [id number]  Arrival date & time 04/16/13  1114   First MD Initiated Contact with Patient 04/16/13 1145      Chief Complaint  Patient presents with  . Sore Throat  . Otalgia  . Nasal Congestion  . Generalized Body Aches         HPI Comments: Patient complains of 5 day history of sinus congestion and body aches.  She developed a fever 2 days ago, and a sore throat this morning.                                                                                                                                                                                                                                                                                                                                                                                                                              The history is provided by the patient.    Past Medical History  Diagnosis Date  . Hypertension   . AVM (arteriovenous malformation)     leaky  . Migraines     Past Surgical History  Procedure Laterality Date  . Emobilization    . Craniotomy  2008    left frontal AVM    Family History  Problem Relation Age of Onset  . Hypertension Father   . Alcohol abuse Father   . Arthritis Father   . Hypertension Mother   . Arthritis Mother   .  Cancer Neg Hx   . Heart disease Neg Hx     History  Substance Use Topics  . Smoking status: Never Smoker   . Smokeless tobacco: Never Used  . Alcohol Use: No    OB History   Grav Para Term Preterm Abortions TAB SAB Ect Mult Living                  Review of Systems + sore throat No cough No pleuritic pain No wheezing + nasal congestion + post-nasal drainage No sinus pain/pressure No itchy/red eyes ? earache No hemoptysis No SOB No fever, + chills + nausea No vomiting + abdominal pain                                                                                                                                              No diarrhea No urinary symptoms No skin rashes + fatigue No myalgias No headache Used OTC meds without relief  Allergies  Codeine; Lisinopril; and Sulfonamide derivatives  Home Medications   Current Outpatient Rx  Name  Route  Sig  Dispense  Refill  . cetirizine (ZYRTEC) 10 MG tablet   Oral   Take 10 mg by mouth daily as needed.          [name] EXPIRED: diltiazem (CARDIZEM CD) 120 MG 24 hr capsule   Oral   Take 1 capsule (120 mg total) by mouth daily.   90 capsule   1   . diltiazem (CARDIZEM CD) 120 MG 24 hr capsule      TAKE 1 CAPSULE (120 MG TOTAL) BY MOUTH DAILY.   90 capsule   1   . fluticasone (FLONASE) 50 MCG/ACT nasal spray      2 sprays into each nostril daily   16 g   2   . minocycline (MINOCIN,DYNACIN) 100 MG capsule   Oral   Take 100 mg by mouth daily as needed.          . penicillin v potassium (VEETID) 500 MG tablet      Take one tab by mouth twice daily for 10 days   20 tablet   0   . SUMAtriptan (IMITREX) 50 MG tablet      One tablet at start of headache.  May repeat once in 2 hours as needed   10 tablet   0     BP 119/79  Pulse 88  Temp(Src) 98.5 F (36.9 C) (Oral)  Resp 16  Ht 5\' 3"  (1.6 m)  Wt 170 lb (77.111 kg)  BMI 30.12 kg/m2  SpO2 100%  LMP 03/18/2013  Physical Exam Nursing notes and Vital Signs reviewed. Appearance:  Patient appears stated age, and in no acute distress.  Patient is obese (BMI 30.1) Eyes:  Pupils are equal, round, and reactive to light and accomodation.  Extraocular movement is intact.  Conjunctivae are  not inflamed  Ears:  Canals normal.  Tympanic membranes normal.  Nose:  Mildly congested turbinates.  No sinus tenderness.   Pharynx:  Erythematous Neck:  Supple.   Tender shotty anterior nodes are palpated bilaterally  Lungs:  Clear to auscultation.  Breath sounds are equal.  Heart:  Regular rate and rhythm without murmurs, rubs, or gallops.  Abdomen:  Nontender without masses or hepatosplenomegaly.   Bowel sounds are present.  No CVA or flank tenderness.  Extremities:  No edema.  No calf tenderness Skin:  No rash present.   ED Course  Procedures  none  Labs Reviewed  POCT RAPID STREP A (OFFICE) - Abnormal; Notable for the following:    Rapid Strep A Screen Positive (*)           1. Streptococcal sore throat       MDM  Begin PenVK May take Ibuprofen 200mg , 4 tabs every 8 hours with food.  Try warm salt water gargles. Followup with Family Doctor if not improved in about 10 days.        Lattie Haw, MD 04/18/13 [phone]

## 2013-04-16 NOTE — ED Notes (Signed)
Gives 5 days history of congestion, body aches, fever 2 days ago with onset of sore throat. No OTC this a.m.[name]

## 2013-04-19 ENCOUNTER — Telehealth: Payer: Self-pay | Admitting: Emergency Medicine

## 2013-05-11 ENCOUNTER — Encounter: Payer: Self-pay | Admitting: Family Medicine

## 2013-05-11 ENCOUNTER — Ambulatory Visit (INDEPENDENT_AMBULATORY_CARE_PROVIDER_SITE_OTHER): Payer: 59 | Admitting: Family Medicine

## 2013-05-11 VITALS — BP 122/78 | HR 83 | Temp 98.7°F | Resp 16 | Ht 63.75 in | Wt 172.1 lb

## 2013-05-11 DIAGNOSIS — M546 Pain in thoracic spine: Secondary | ICD-10-CM

## 2013-05-11 DIAGNOSIS — I10 Essential (primary) hypertension: Secondary | ICD-10-CM

## 2013-05-11 DIAGNOSIS — M549 Dorsalgia, unspecified: Secondary | ICD-10-CM

## 2013-05-11 MED ORDER — MELOXICAM 15 MG PO TABS: 15.0000 mg | ORAL_TABLET | Freq: Every day | ORAL | Status: DC | PRN

## 2013-05-11 MED ORDER — METHOCARBAMOL 500 MG PO TABS: 500.0000 mg | ORAL_TABLET | Freq: Two times a day (BID) | ORAL | Status: DC | PRN

## 2013-05-11 NOTE — Assessment & Plan Note (Signed)
Moist heat, gentle stretching, good posture, Meloxicam in am and Robaxin qhs. Salon pas patches prn and report if no improvement

## 2013-05-11 NOTE — Patient Instructions (Addendum)
Moist heat, stretch, Salon Pas patches  Back Pain, Adult Low back pain is very common. About 1 in 5 people have back pain.The cause of low back pain is rarely dangerous. The pain often gets better over time.About half of people with a sudden onset of back pain feel better in just 2 weeks. About 8 in 10 people feel better by 6 weeks.  CAUSES Some common causes of back pain include:  Strain of the muscles or ligaments supporting the spine.  Wear and tear (degeneration) of the spinal discs.  Arthritis.  Direct injury to the back. DIAGNOSIS Most of the time, the direct cause of low back pain is not known.However, back pain can be treated effectively even when the exact cause of the pain is unknown.Answering your caregiver's questions about your overall health and symptoms is one of the most accurate ways to make sure the cause of your pain is not dangerous. If your caregiver needs more information, he or she may order lab work or imaging tests (X-rays or MRIs).However, even if imaging tests show changes in your back, this usually does not require surgery. HOME CARE INSTRUCTIONS For many people, back pain returns.Since low back pain is rarely dangerous, it is often a condition that people can learn to [place] their own.   Remain active. It is stressful on the back to sit or stand in one place. Do not sit, drive, or stand in one place for more than 30 minutes at a time. Take short walks on level surfaces as soon as pain allows.Try to increase the length of time you walk each day.  Do not stay in bed.Resting more than 1 or 2 days can delay your recovery.  Do not avoid exercise or work.Your body is made to move.It is not dangerous to be active, even though your back may hurt.Your back will likely heal faster if you return to being active before your pain is gone.  Pay attention to your body when you bend and lift. Many people have less discomfortwhen lifting if they bend their knees,  keep the load close to their bodies,and avoid twisting. Often, the most comfortable positions are those that put less stress on your recovering back.  Find a comfortable position to sleep. Use a firm mattress and lie on your side with your knees slightly bent. If you lie on your back, put a pillow under your knees.  Only take over-the-counter or prescription medicines as directed by your caregiver. Over-the-counter medicines to reduce pain and inflammation are often the most helpful.Your caregiver may prescribe muscle relaxant drugs.These medicines help dull your pain so you can more quickly return to your normal activities and healthy exercise.  Put ice on the injured area.  Put ice in a plastic bag.  Place a towel between your skin and the bag.  Leave the ice on for 15-20 minutes, 3-4 times a day for the first 2 to 3 days. After that, ice and heat may be alternated to reduce pain and spasms.  Ask your caregiver about trying back exercises and gentle massage. This may be of some benefit.  Avoid feeling anxious or stressed.Stress increases muscle tension and can worsen back pain.It is important to recognize when you are anxious or stressed and learn ways to manage it.Exercise is a great option. SEEK MEDICAL CARE IF:  You have pain that is not relieved with rest or medicine.  You have pain that does not improve in 1 week.  You have new symptoms.  You are generally not feeling well. SEEK IMMEDIATE MEDICAL CARE IF:   You have pain that radiates from your back into your legs.  You develop new bowel or bladder control problems.  You have unusual weakness or numbness in your arms or legs.  You develop nausea or vomiting.  You develop abdominal pain.  You feel faint. Document Released: 10/26/2005 Document Revised: 04/26/2012 Document Reviewed: 03/16/2011 [facility] Patient Information 2014 [place], [place].

## 2013-05-11 NOTE — Progress Notes (Signed)
Patient ID: Kaitlin Foster, female   DOB: 10-27-1981, 32 y.o.   MRN: [id number] Kaitlin Foster [id number] 04-21-1981 05/11/2013      Progress Note-Follow Up  Subjective  Chief Complaint  Chief Complaint  Patient presents with  . Back Pain    Pt reports mid back pain x 2-3 days.     HPI  Patient has a 32 year old Caucasian female who is in today for evaluation of thoracic back pain. She's had similar episodes in the past but usually they resolve unremarkably. This time has had back pain for several days. Pain is worse in the morning and upon first arising and after prolonged sitting. It is noted around her lower thoracic spine. Worse with certain movements and turning. Ibuprofen has been marginally helpful. No falls or trauma. No shortness of breath or other chest pain. No GI or GU complaints. No fevers or chills.  Past Medical History  Diagnosis Date  . Hypertension   . AVM (arteriovenous malformation)     leaky  . Migraines     Past Surgical History  Procedure Laterality Date  . Emobilization    . Craniotomy  2008    left frontal AVM    Family History  Problem Relation Age of Onset  . Hypertension Father   . Alcohol abuse Father   . Arthritis Father   . Hypertension Mother   . Arthritis Mother   . Cancer Neg Hx   . Heart disease Neg Hx     History   Social History  . Marital Status: Married    Spouse Name: N/A    Number of Children: N/A  . Years of Education: N/A   Occupational History  . Not on file.   Social History Main Topics  . Smoking status: Never Smoker   . Smokeless tobacco: Never Used  . Alcohol Use: No  . Drug Use: Not on file  . Sexually Active: Yes    Birth Control/ Protection: Condom   Other Topics Concern  . Not on file   Social History Narrative   Regular exercise: 2 x weekly   Caffeine use:  1 coffee daily          Current Outpatient Prescriptions on File Prior to Visit  Medication Sig Dispense Refill  . cetirizine (ZYRTEC) 10  MG tablet Take 10 mg by mouth daily as needed.       . diltiazem (CARDIZEM CD) 120 MG 24 hr capsule Take 1 capsule (120 mg total) by mouth daily.  90 capsule  1  . fluticasone (FLONASE) 50 MCG/ACT nasal spray 2 sprays into each nostril daily  16 g  2  . minocycline (MINOCIN,DYNACIN) 100 MG capsule Take 100 mg by mouth daily as needed.       . SUMAtriptan (IMITREX) 50 MG tablet One tablet at start of headache.  May repeat once in 2 hours as needed  10 tablet  0   No current facility-administered medications on file prior to visit.    Allergies  Allergen Reactions  . Codeine     REACTION: upset stomach once, but has taken it other times and been ok  . Lisinopril   . Sulfonamide Derivatives     Review of Systems  Review of Systems  Constitutional: Negative for fever and malaise/fatigue.  HENT: Negative for congestion.   Eyes: Negative for pain and discharge.  Respiratory: Negative for shortness of breath.   Cardiovascular: Negative for chest pain, palpitations and leg swelling.  Gastrointestinal: Negative  for nausea, abdominal pain and diarrhea.  Genitourinary: Negative for dysuria.  Musculoskeletal: Negative for falls.  Skin: Negative for rash.  Neurological: Negative for loss of consciousness and headaches.  Endo/Heme/Allergies: Negative for polydipsia.  Psychiatric/Behavioral: Negative for depression and suicidal ideas. The patient is not nervous/anxious and does not have insomnia.     Objective  BP 122/78  Pulse 83  Temp(Src) 98.7 F (37.1 C) (Oral)  Resp 16  Ht 5' 3.75" (1.619 m)  Wt 172 lb 1.9 oz (78.073 kg)  BMI 29.79 kg/m2  SpO2 99%  LMP 04/24/2013  Physical Exam  Physical Exam  Constitutional: She is oriented to person, place, and time and well-developed, well-nourished, and in no distress. No distress.  HENT:  Head: Normocephalic and atraumatic.  Eyes: Conjunctivae are normal.  Neck: Neck supple. No thyromegaly present.  Cardiovascular: Normal rate and  regular rhythm.  Exam reveals no gallop.   No murmur heard. Pulmonary/Chest: Effort normal and breath sounds normal. She has no wheezes.  Abdominal: She exhibits no distension and no mass.  Musculoskeletal: She exhibits no edema.  Lymphadenopathy:    She has no cervical adenopathy.  Neurological: She is alert and oriented to person, place, and time.  Skin: Skin is warm and dry. No rash noted. She is not diaphoretic.  Psychiatric: Memory, affect and judgment normal.    Lab Results  Component Value Date   TSH 1.073 02/29/2012   Lab Results  Component Value Date   WBC 5.0 02/29/2012   HGB 14.5 02/29/2012   HCT 44.5 02/29/2012   MCV 88.3 02/29/2012   PLT 211 02/29/2012   Lab Results  Component Value Date   CREATININE 0.78 02/29/2012   BUN 17 02/29/2012   NA 141 02/29/2012   K 4.1 02/29/2012   CL 106 02/29/2012   CO2 27 02/29/2012   Lab Results  Component Value Date   ALT 11 02/29/2012   AST 19 02/29/2012   ALKPHOS 74 02/29/2012   BILITOT 0.5 02/29/2012   Lab Results  Component Value Date   CHOL 157 02/29/2012   Lab Results  Component Value Date   HDL 23* 02/29/2012   Lab Results  Component Value Date   LDLCALC 118* 02/29/2012   Lab Results  Component Value Date   TRIG 82 02/29/2012   Lab Results  Component Value Date   CHOLHDL 6.8 02/29/2012     Assessment & Plan  ESSENTIAL HYPERTENSION, BENIGN Well controlled on current meds, no changes  Back pain, thoracic Moist heat, gentle stretching, good posture, Meloxicam in am and Robaxin qhs. Salon pas patches prn and report if no improvement

## 2013-05-11 NOTE — Assessment & Plan Note (Signed)
Well controlled on current meds, no changes 

## 2013-07-27 ENCOUNTER — Ambulatory Visit (INDEPENDENT_AMBULATORY_CARE_PROVIDER_SITE_OTHER): Payer: 59 | Admitting: Physician Assistant

## 2013-07-27 ENCOUNTER — Encounter: Payer: Self-pay | Admitting: Physician Assistant

## 2013-07-27 VITALS — BP 118/86 | HR 94 | Temp 98.1°F | Resp 16 | Ht 63.5 in | Wt 171.0 lb

## 2013-07-27 DIAGNOSIS — H6690 Otitis media, unspecified, unspecified ear: Secondary | ICD-10-CM

## 2013-07-27 DIAGNOSIS — J019 Acute sinusitis, unspecified: Secondary | ICD-10-CM

## 2013-07-27 DIAGNOSIS — H6692 Otitis media, unspecified, left ear: Secondary | ICD-10-CM

## 2013-07-27 MED ORDER — HYDROCOD POLST-CHLORPHEN POLST 10-8 MG/5ML PO LQCR: 5.0000 mL | Freq: Two times a day (BID) | ORAL | Status: DC | PRN

## 2013-07-27 MED ORDER — AMOXICILLIN-POT CLAVULANATE 875-125 MG PO TABS: 1.0000 | ORAL_TABLET | Freq: Two times a day (BID) | ORAL | Status: DC

## 2013-07-27 NOTE — Patient Instructions (Signed)
Please get plenty of rest and drink plenty of fluids.  Daily probiotic.  Saline nasal spray.  Continue flonase and Zyrtec. Take antibiotic as prescribed with food, until all tablets are gone.  Sinusitis Sinusitis is redness, soreness, and swelling (inflammation) of the paranasal sinuses. Paranasal sinuses are air pockets within the bones of your face (beneath the eyes, the middle of the forehead, or above the eyes). In healthy paranasal sinuses, mucus is able to drain out, and air is able to circulate through them by way of your nose. However, when your paranasal sinuses are inflamed, mucus and air can become trapped. This can allow bacteria and other germs to grow and cause infection. Sinusitis can develop quickly and last only a short time (acute) or continue over a long period (chronic). Sinusitis that lasts for more than 12 weeks is considered chronic.  CAUSES  Causes of sinusitis include:  Allergies.  Structural abnormalities, such as displacement of the cartilage that separates your nostrils (deviated septum), which can decrease the air flow through your nose and sinuses and affect sinus drainage.  Functional abnormalities, such as when the small hairs (cilia) that line your sinuses and help remove mucus do not work properly or are not present. SYMPTOMS  Symptoms of acute and chronic sinusitis are the same. The primary symptoms are pain and pressure around the affected sinuses. Other symptoms include:  Upper toothache.  Earache.  Headache.  Bad breath.  Decreased sense of smell and taste.  A cough, which worsens when you are lying flat.  Fatigue.  Fever.  Thick drainage from your nose, which often is green and may contain pus (purulent).  Swelling and warmth over the affected sinuses. DIAGNOSIS  Your caregiver will perform a physical exam. During the exam, your caregiver may:  Look in your nose for signs of abnormal growths in your nostrils (nasal polyps).  Tap over the  affected sinus to check for signs of infection.  View the inside of your sinuses (endoscopy) with a special imaging device with a light attached (endoscope), which is inserted into your sinuses. If your caregiver suspects that you have chronic sinusitis, one or more of the following tests may be recommended:  Allergy tests.  Nasal culture A sample of mucus is taken from your nose and sent to a lab and screened for bacteria.  Nasal cytology A sample of mucus is taken from your nose and examined by your caregiver to determine if your sinusitis is related to an allergy. TREATMENT  Most cases of acute sinusitis are related to a viral infection and will resolve on their own within 10 days. Sometimes medicines are prescribed to help relieve symptoms (pain medicine, decongestants, nasal steroid sprays, or saline sprays).  However, for sinusitis related to a bacterial infection, your caregiver will prescribe antibiotic medicines. These are medicines that will help kill the bacteria causing the infection.  Rarely, sinusitis is caused by a fungal infection. In theses cases, your caregiver will prescribe antifungal medicine. For some cases of chronic sinusitis, surgery is needed. Generally, these are cases in which sinusitis recurs more than 3 times per year, despite other treatments. HOME CARE INSTRUCTIONS   Drink plenty of water. Water helps thin the mucus so your sinuses can drain more easily.  Use a humidifier.  Inhale steam 3 to 4 times a day (for example, sit in the bathroom with the shower running).  Apply a warm, moist washcloth to your face 3 to 4 times a day, or as directed by  your caregiver.  Use saline nasal sprays to help moisten and clean your sinuses.  Take over-the-counter or prescription medicines for pain, discomfort, or fever only as directed by your caregiver. SEEK IMMEDIATE MEDICAL CARE IF:  You have increasing pain or severe headaches.  You have nausea, vomiting, or  drowsiness.  You have swelling around your face.  You have vision problems.  You have a stiff neck.  You have difficulty breathing. MAKE SURE YOU:   Understand these instructions.  Will watch your condition.  Will get help right away if you are not doing well or get worse. Document Released: 10/26/2005 Document Revised: 01/18/2012 Document Reviewed: 11/10/2011 [facility] Patient Information 2014 [place], [place].

## 2013-07-30 DIAGNOSIS — H6690 Otitis media, unspecified, unspecified ear: Secondary | ICD-10-CM | POA: Insufficient documentation

## 2013-07-30 DIAGNOSIS — J019 Acute sinusitis, unspecified: Secondary | ICD-10-CM | POA: Insufficient documentation

## 2013-07-30 NOTE — Progress Notes (Signed)
Patient ID: Kaitlin Foster, female   DOB: 10-Jan-1981, 32 y.o.   MRN: [id number]  Patient presents to clinic today c/o sinus congestion, pain, earache x 4 days.  Denies fever, chills, sweats, recent sick contact.  Denies N/V/C/D.  Endorses green nasal discharge.  Endorses headache.  States she has a history of bad sinus infections yearly.  Endorses history of allergy.  Endorses dry, hacking cough that is keeping her awake at night.  Endorses post-nasal drip.   Past Medical History  Diagnosis Date  . Hypertension   . AVM (arteriovenous malformation)     leaky  . Migraines     Current Outpatient Prescriptions on File Prior to Visit  Medication Sig Dispense Refill  . cetirizine (ZYRTEC) 10 MG tablet Take 10 mg by mouth daily as needed.       . diltiazem (CARDIZEM CD) 120 MG 24 hr capsule Take 1 capsule (120 mg total) by mouth daily.  90 capsule  1  . fluticasone (FLONASE) 50 MCG/ACT nasal spray 2 sprays into each nostril daily  16 g  2  . meloxicam (MOBIC) 15 MG tablet Take 1 tablet (15 mg total) by mouth daily as needed for pain (with food).  30 tablet  1  . methocarbamol (ROBAXIN) 500 MG tablet Take 1 tablet (500 mg total) by mouth 2 (two) times daily as needed. Muscle spasm  40 tablet  1  . minocycline (MINOCIN,DYNACIN) 100 MG capsule Take 100 mg by mouth daily as needed.       . SUMAtriptan (IMITREX) 50 MG tablet One tablet at start of headache.  May repeat once in 2 hours as needed  10 tablet  0   No current facility-administered medications on file prior to visit.    Allergies  Allergen Reactions  . Codeine     REACTION: upset stomach once, but has taken it other times and been ok  . Lisinopril   . Sulfonamide Derivatives     Family History  Problem Relation Age of Onset  . Hypertension Father   . Alcohol abuse Father   . Arthritis Father   . Hypertension Mother   . Arthritis Mother   . Cancer Neg Hx   . Heart disease Neg Hx     History   Social History  . Marital  Status: Married    Spouse Name: N/A    Number of Children: N/A  . Years of Education: N/A   Social History Main Topics  . Smoking status: Never Smoker   . Smokeless tobacco: Never Used  . Alcohol Use: No  . Drug Use: None  . Sexual Activity: Yes    Birth Control/ Protection: Condom   Other Topics Concern  . None   Social History Narrative   Regular exercise: 2 x weekly   Caffeine use:  1 coffee daily          ROS See HPI   Filed Vitals:   07/27/13 1615  BP: 118/86  Pulse: 94  Temp: 98.1 F (36.7 C)  Resp: 16   Physical Exam  Vitals reviewed. Constitutional: She is oriented to person, place, and time and well-developed, well-nourished, and in no distress.  HENT:  Head: Normocephalic and atraumatic.  Right Ear: External ear normal.  Left Ear: External ear normal.  Nose: Nose normal.  Mouth/Throat: Oropharynx is clear and moist. No oropharyngeal exudate.  Moderate tenderness to palpation of frontal and maxillary sinuses.  Right TM WNL.  L TM erythematous and bulging.  Eyes:  Conjunctivae are normal.  Neck: Neck supple.  Cardiovascular: Normal rate, regular rhythm and normal heart sounds.   Pulmonary/Chest: Effort normal and breath sounds normal.  Lymphadenopathy:    She has no cervical adenopathy.  Neurological: She is alert and oriented to person, place, and time.  Skin: Skin is warm and dry. No rash noted.     No results found for this or any previous visit (from the past 2160 hour(s)).  Assessment/Plan: Otitis media Rx Augmentin for AOM and concomitant sinusitis  Acute sinusitis Rx Augmenitn for sinusitis and AOM.  Rest/Fluids.  Daily Zyrtec.  Flonase and/or saline nasal spray.  Daily probiotic.

## 2013-07-30 NOTE — Assessment & Plan Note (Signed)
Rx Augmenitn for sinusitis and AOM.  Rest/Fluids.  Daily Zyrtec.  Flonase and/or saline nasal spray.  Daily probiotic.

## 2013-07-30 NOTE — Assessment & Plan Note (Signed)
Rx Augmentin for AOM and concomitant sinusitis

## 2013-08-16 ENCOUNTER — Other Ambulatory Visit: Payer: Self-pay | Admitting: Family

## 2013-08-21 ENCOUNTER — Ambulatory Visit: Payer: 59 | Admitting: Family Medicine

## 2013-08-29 ENCOUNTER — Ambulatory Visit (INDEPENDENT_AMBULATORY_CARE_PROVIDER_SITE_OTHER): Payer: Self-pay | Admitting: Family Medicine

## 2013-08-29 DIAGNOSIS — Z713 Dietary counseling and surveillance: Secondary | ICD-10-CM

## 2013-09-06 ENCOUNTER — Ambulatory Visit: Payer: 59 | Admitting: Physician Assistant

## 2013-09-14 ENCOUNTER — Other Ambulatory Visit: Payer: Self-pay

## 2013-10-02 ENCOUNTER — Ambulatory Visit: Payer: 59 | Admitting: Family

## 2013-10-02 ENCOUNTER — Encounter: Payer: Self-pay | Admitting: Family

## 2013-10-02 ENCOUNTER — Ambulatory Visit (INDEPENDENT_AMBULATORY_CARE_PROVIDER_SITE_OTHER): Payer: 59 | Admitting: Family

## 2013-10-02 VITALS — BP 124/86 | HR 106 | Temp 101.6°F | Resp 16 | Ht 63.75 in | Wt 175.0 lb

## 2013-10-02 DIAGNOSIS — H6690 Otitis media, unspecified, unspecified ear: Secondary | ICD-10-CM

## 2013-10-02 DIAGNOSIS — R509 Fever, unspecified: Secondary | ICD-10-CM

## 2013-10-02 DIAGNOSIS — J020 Streptococcal pharyngitis: Secondary | ICD-10-CM | POA: Insufficient documentation

## 2013-10-02 DIAGNOSIS — H6691 Otitis media, unspecified, right ear: Secondary | ICD-10-CM

## 2013-10-02 LAB — POCT INFLUENZA A/B: Influenza A, POC: NEGATIVE

## 2013-10-02 LAB — POCT RAPID STREP A (OFFICE): Rapid Strep A Screen: POSITIVE — AB

## 2013-10-02 MED ORDER — AMOXICILLIN 500 MG PO CAPS: 500.0000 mg | ORAL_CAPSULE | Freq: Three times a day (TID) | ORAL | Status: DC

## 2013-10-02 NOTE — Assessment & Plan Note (Signed)
Rx with Amoxicillin.

## 2013-10-02 NOTE — Progress Notes (Signed)
Pre visit review using our clinic review tool, if applicable. No additional management support is needed unless otherwise documented below in the visit note.,  

## 2013-10-02 NOTE — Assessment & Plan Note (Signed)
Positive swab for strep.   Start Amoxicillin 500mg  TID for 10 days.  Follow up as needed.

## 2013-10-02 NOTE — Progress Notes (Signed)
Subjective:    Patient ID: Kaitlin Foster, female    DOB: 1981-03-21, 32 y.o.   MRN: [id number]  HPI [name] is a 32 year old female who presents today for right ear pain that began this morning and sore throat that began this weekend.  Patient taking OTC ibuprofen with temporary relief and reports low grade fevers at home. Patient received flu shot 2 months ago. Patient febrile in office. Patient also presents with productive cough with clear/blood tinged sputum x 2 weeks.   Review of Systems  Constitutional: Positive for appetite change and fatigue.       Patient reports decrease in activity level and decrease in appetite.  HENT: Positive for postnasal drip, rhinorrhea, sinus pressure and sore throat.   Respiratory: Positive for cough. Negative for shortness of breath.   Cardiovascular: Negative for chest pain.  Gastrointestinal: Negative for abdominal pain and diarrhea.  Musculoskeletal: Positive for myalgias.  Neurological: Positive for dizziness and headaches.   Past Medical History  Diagnosis Date  . Hypertension   . AVM (arteriovenous malformation)     leaky  . Migraines     History   Social History  . Marital Status: Married    Spouse Name: N/A    Number of Children: N/A  . Years of Education: N/A   Occupational History  . Not on file.   Social History Main Topics  . Smoking status: Never Smoker   . Smokeless tobacco: Never Used  . Alcohol Use: No  . Drug Use: Not on file  . Sexual Activity: Yes    Birth Control/ Protection: Condom   Other Topics Concern  . Not on file   Social History Narrative   Regular exercise: 2 x weekly   Caffeine use:  1 coffee daily          Past Surgical History  Procedure Laterality Date  . Emobilization    . Craniotomy  2008    left frontal AVM    Family History  Problem Relation Age of Onset  . Hypertension Father   . Alcohol abuse Father   . Arthritis Father   . Hypertension Mother   . Arthritis Mother   .  Cancer Neg Hx   . Heart disease Neg Hx     Allergies  Allergen Reactions  . Codeine     REACTION: upset stomach once, but has taken it other times and been ok  . Lisinopril   . Sulfonamide Derivatives     Current Outpatient Prescriptions on File Prior to Visit  Medication Sig Dispense Refill  . cetirizine (ZYRTEC) 10 MG tablet Take 10 mg by mouth daily as needed.       . diltiazem (CARDIZEM CD) 120 MG 24 hr capsule Take 1 capsule (120 mg total) by mouth daily.  90 capsule  1  . diltiazem (CARDIZEM CD) 120 MG 24 hr capsule TAKE 1 CAPSULE BY MOUTH DAILY  90 capsule  1  . fluticasone (FLONASE) 50 MCG/ACT nasal spray 2 sprays into each nostril daily  16 g  2  . meloxicam (MOBIC) 15 MG tablet Take 1 tablet (15 mg total) by mouth daily as needed for pain (with food).  30 tablet  1  . methocarbamol (ROBAXIN) 500 MG tablet Take 1 tablet (500 mg total) by mouth 2 (two) times daily as needed. Muscle spasm  40 tablet  1  . SUMAtriptan (IMITREX) 50 MG tablet One tablet at start of headache.  May repeat once in 2  hours as needed  10 tablet  0   No current facility-administered medications on file prior to visit.    BP 124/86  Pulse 106  Temp(Src) 101.6 F (38.7 C) (Oral)  Resp 16  Ht 5' 3.75" (1.619 m)  Wt 175 lb 0.6 oz (79.398 kg)  BMI 30.29 kg/m2  SpO2 99%       Objective:   Physical Exam  Constitutional: She is oriented to person, place, and time. She appears well-nourished.  HENT:  Head: Normocephalic.  Right Ear: Tympanic membrane is erythematous.  Mouth/Throat: Posterior oropharyngeal edema and posterior oropharyngeal erythema present.  Eyes: Pupils are equal, round, and reactive to light.  Neck: Neck supple.  Cardiovascular: Normal rate and normal heart sounds.   Pulmonary/Chest: Effort normal and breath sounds normal.  Lymphadenopathy:    She has cervical adenopathy.  Neurological: She is alert and oriented to person, place, and time.  Skin: Skin is warm and dry.   Psychiatric: She has a normal mood and affect.          Assessment & Plan:

## 2013-10-02 NOTE — Patient Instructions (Signed)
Strep Throat  Strep throat is an infection of the throat caused by a bacteria named Streptococcus pyogenes. Your caregiver may call the infection streptococcal "tonsillitis" or "pharyngitis" depending on whether there are signs of inflammation in the tonsils or back of the throat. Strep throat is most common in children aged 31 15 years during the cold months of the year, but it can occur in people of any age during any season. This infection is spread from person to person (contagious) through coughing, sneezing, or other close contact.  SYMPTOMS   · Fever or chills.  · Painful, swollen, red tonsils or throat.  · Pain or difficulty when swallowing.  · White or yellow spots on the tonsils or throat.  · Swollen, tender lymph nodes or "glands" of the neck or under the jaw.  · Red rash all over the body (rare).  DIAGNOSIS   Many different infections can cause the same symptoms. A test must be done to confirm the diagnosis so the right treatment can be given. A "rapid strep test" can help your caregiver make the diagnosis in a few minutes. If this test is not available, a light swab of the infected area can be used for a throat culture test. If a throat culture test is done, results are usually available in a day or two.  TREATMENT   Strep throat is treated with antibiotic medicine.  HOME CARE INSTRUCTIONS   · Gargle with 1 tsp of salt in 1 cup of warm water, 3 4 times per day or as needed for comfort.  · Family members who also have a sore throat or fever should be tested for strep throat and treated with antibiotics if they have the strep infection.  · Make sure everyone in your household washes their hands well.  · Do not share food, drinking cups, or personal items that could cause the infection to spread to others.  · You may need to eat a soft food diet until your sore throat gets better.  · Drink enough water and fluids to keep your urine clear or pale yellow. This will help prevent dehydration.  · Get plenty of  rest.  · Stay home from school, daycare, or work until you have been on antibiotics for 24 hours.  · Only take over-the-counter or prescription medicines for pain, discomfort, or fever as directed by your caregiver.  · If antibiotics are prescribed, take them as directed. Finish them even if you start to feel better.  SEEK MEDICAL CARE IF:   · The glands in your neck continue to enlarge.  · You develop a rash, cough, or earache.  · You cough up green, yellow-brown, or bloody sputum.  · You have pain or discomfort not controlled by medicines.  · Your problems seem to be getting worse rather than better.  SEEK IMMEDIATE MEDICAL CARE IF:   · You develop any new symptoms such as vomiting, severe headache, stiff or painful neck, chest pain, shortness of breath, or trouble swallowing.  · You develop severe throat pain, drooling, or changes in your voice.  · You develop swelling of the neck, or the skin on the neck becomes red and tender.  · You have a fever.  · You develop signs of dehydration, such as fatigue, dry mouth, and decreased urination.  · You become increasingly sleepy, or you cannot wake up completely.  Document Released: 10/23/2000 Document Revised: 10/12/2012 Document Reviewed: 12/25/2010  [employer]® Patient Information ©2014 [employer].

## 2013-12-23 ENCOUNTER — Encounter: Payer: Self-pay | Admitting: Emergency Medicine

## 2013-12-23 ENCOUNTER — Emergency Department (INDEPENDENT_AMBULATORY_CARE_PROVIDER_SITE_OTHER)
Admission: EM | Admit: 2013-12-23 | Discharge: 2013-12-23 | Disposition: A | Discharge status: Home / Self Care | Payer: 59 | Source: Home / Self Care | Attending: Family Medicine | Admitting: Family Medicine

## 2013-12-23 DIAGNOSIS — J111 Influenza due to unidentified influenza virus with other respiratory manifestations: Secondary | ICD-10-CM

## 2013-12-23 DIAGNOSIS — R059 Cough, unspecified: Secondary | ICD-10-CM

## 2013-12-23 DIAGNOSIS — J029 Acute pharyngitis, unspecified: Secondary | ICD-10-CM

## 2013-12-23 DIAGNOSIS — R05 Cough: Secondary | ICD-10-CM

## 2013-12-23 DIAGNOSIS — R5383 Other fatigue: Secondary | ICD-10-CM

## 2013-12-23 DIAGNOSIS — R69 Illness, unspecified: Principal | ICD-10-CM

## 2013-12-23 DIAGNOSIS — R5381 Other malaise: Secondary | ICD-10-CM

## 2013-12-23 LAB — POCT RAPID STREP A (OFFICE): RAPID STREP A SCREEN: NEGATIVE

## 2013-12-23 MED ORDER — OSELTAMIVIR PHOSPHATE 75 MG PO CAPS: 75.0000 mg | ORAL_CAPSULE | Freq: Two times a day (BID) | ORAL | Status: DC

## 2013-12-23 MED ORDER — BENZONATATE 200 MG PO CAPS: 200.0000 mg | ORAL_CAPSULE | Freq: Every day | ORAL | Status: DC

## 2013-12-23 NOTE — Discharge Instructions (Signed)
Take plain Mucinex (1200 mg guaifenesin) twice daily for cough and congestion.  Increase fluid intake, rest. May use Afrin nasal spray (or generic oxymetazoline) twice daily for about 5 days.  Also recommend using saline nasal spray several times daily and saline nasal irrigation (AYR is a common brand) Try warm salt water gargles for sore throat.  Stop all antihistamines for now, and other non-prescription cough/cold preparations. May take Ibuprofen 200mg , 4 tabs every 8 hours with food for chest/sternum discomfort, body aches, etc.   Follow-up with family doctor if not improving about 6 days.   Influenza, Adult Influenza ("the flu") is a viral infection of the respiratory tract. It occurs more often in winter months because people spend more time in close contact with one another. Influenza can make you feel very sick. Influenza easily spreads from person to person (contagious). CAUSES  Influenza is caused by a virus that infects the respiratory tract. You can catch the virus by breathing in droplets from an infected person's cough or sneeze. You can also catch the virus by touching something that was recently contaminated with the virus and then touching your mouth, nose, or eyes. SYMPTOMS  Symptoms typically last 4 to 10 days and may include:  Fever.  Chills.  Headache, body aches, and muscle aches.  Sore throat.  Chest discomfort and cough.  Poor appetite.  Weakness or feeling tired.  Dizziness.  Nausea or vomiting. DIAGNOSIS  Diagnosis of influenza is often made based on your history and a physical exam. A nose or throat swab test can be done to confirm the diagnosis. RISKS AND COMPLICATIONS You may be at risk for a more severe case of influenza if you smoke cigarettes, have diabetes, have chronic heart disease (such as heart failure) or lung disease (such as asthma), or if you have a weakened immune system. Elderly people and pregnant women are also at risk for more serious  infections. The most common complication of influenza is a lung infection (pneumonia). Sometimes, this complication can require emergency medical care and may be life-threatening. PREVENTION  An annual influenza vaccination (flu shot) is the best way to avoid getting influenza. An annual flu shot is now routinely recommended for all adults in the [place] TREATMENT  In mild cases, influenza goes away on its own. Treatment is directed at relieving symptoms. For more severe cases, your caregiver may prescribe antiviral medicines to shorten the sickness. Antibiotic medicines are not effective, because the infection is caused by a virus, not by bacteria. HOME CARE INSTRUCTIONS  Only take over-the-counter or prescription medicines for pain, discomfort, or fever as directed by your caregiver.  Use a cool mist humidifier to make breathing easier.  Get plenty of rest until your temperature returns to normal. This usually takes 3 to 4 days.  Drink enough fluids to keep your urine clear or pale yellow.  Cover your mouth and nose when coughing or sneezing, and wash your hands well to avoid spreading the virus.  Stay home from work or school until your fever has been gone for at least 1 full day. SEEK MEDICAL CARE IF:   You have chest pain or a deep cough that worsens or produces more mucus.  You have nausea, vomiting, or diarrhea. SEEK IMMEDIATE MEDICAL CARE IF:   You have difficulty breathing, shortness of breath, or your skin or nails turn bluish.  You have severe neck pain or stiffness.  You have a severe headache, facial pain, or earache.  You have a worsening  or recurring fever.  You have nausea or vomiting that cannot be controlled. MAKE SURE YOU:  Understand these instructions.  Will watch your condition.  Will get help right away if you are not doing well or get worse. Document Released: 10/23/2000 Document Revised: 04/26/2012 Document Reviewed: 01/25/2012 [facility] Patient  Information 2014 [place], [place].

## 2013-12-23 NOTE — ED Notes (Signed)
Awoke this morning with body aches, headache, sore throat, congestion and cough. Took Aleve at 0830 for temp 100.1 tympanic.

## 2013-12-23 NOTE — ED Provider Notes (Signed)
CSN: [id number]     Arrival date & time 12/23/13  1250 History   First MD Initiated Contact with Patient 12/23/13 1407     Chief Complaint  Patient presents with  . Sore Throat  . Generalized Body Aches  . Nasal Congestion  . Headache  . Cough        HPI Comments: Patient awoke this morning with fatigue, myalgias, fever to 100+, sore throat, headache, mild cough, and pressure in her left ear.  The history is provided by the patient.    Past Medical History  Diagnosis Date  . Hypertension   . AVM (arteriovenous malformation)     leaky  . Migraines    Past Surgical History  Procedure Laterality Date  . Emobilization    . Craniotomy  2008    left frontal AVM   Family History  Problem Relation Age of Onset  . Hypertension Father   . Alcohol abuse Father   . Arthritis Father   . Hypertension Mother   . Arthritis Mother   . Cancer Neg Hx   . Heart disease Neg Hx    History  Substance Use Topics  . Smoking status: Never Smoker   . Smokeless tobacco: Never Used  . Alcohol Use: No   OB History   Grav Para Term Preterm Abortions TAB SAB Ect Mult Living                 Review of Systems + sore throat + cough No pleuritic pain No wheezing + nasal congestion ? post-nasal drainage No sinus pain/pressure No itchy/red eyes ? earache No hemoptysis No SOB + fever, + chills No nausea No vomiting No abdominal pain No diarrhea No urinary symptoms No skin rash + fatigue + myalgias + headache Used OTC meds without relief    Allergies  Codeine; Lisinopril; and Sulfonamide derivatives  Home Medications   Current Outpatient Rx  Name  Route  Sig  Dispense  Refill  . amoxicillin (AMOXIL) 500 MG capsule   Oral   Take 1 capsule (500 mg total) by mouth 3 (three) times daily.   30 capsule   0   . benzonatate (TESSALON) 200 MG capsule   Oral   Take 1 capsule (200 mg total) by mouth at bedtime. Take as needed for cough   12 capsule   0   . cetirizine  (ZYRTEC) 10 MG tablet   Oral   Take 10 mg by mouth daily as needed.          . diltiazem (CARDIZEM CD) 120 MG 24 hr capsule   Oral   Take 1 capsule (120 mg total) by mouth daily.   90 capsule   1   . diltiazem (CARDIZEM CD) 120 MG 24 hr capsule      TAKE 1 CAPSULE BY MOUTH DAILY   90 capsule   1   . fluticasone (FLONASE) 50 MCG/ACT nasal spray      2 sprays into each nostril daily   16 g   2   . meloxicam (MOBIC) 15 MG tablet   Oral   Take 1 tablet (15 mg total) by mouth daily as needed for pain (with food).   30 tablet   1   . methocarbamol (ROBAXIN) 500 MG tablet   Oral   Take 1 tablet (500 mg total) by mouth 2 (two) times daily as needed. Muscle spasm   40 tablet   1   . oseltamivir (TAMIFLU) 75 MG capsule  Oral   Take 1 capsule (75 mg total) by mouth every 12 (twelve) hours.   10 capsule   0   . SUMAtriptan (IMITREX) 50 MG tablet      One tablet at start of headache.  May repeat once in 2 hours as needed   10 tablet   0    BP 117/83  Pulse 99  Temp(Src) 98.6 F (37 C) (Oral)  Resp 16  Ht 5\' 3"  (1.6 m)  Wt 173 lb (78.472 kg)  BMI 30.65 kg/m2  SpO2 99%  LMP 12/15/2013 Physical Exam Nursing notes and Vital Signs reviewed. Appearance:  Patient appears stated age, and in no acute distress.  Patient is obese (BMI 30.7) Eyes:  Pupils are equal, round, and reactive to light and accomodation.  Extraocular movement is intact.  Conjunctivae are not inflamed  Ears:  Canals normal.  Tympanic membranes normal.  Nose:  Mildly congested turbinates.  No sinus tenderness.    Pharynx:  Normal Neck:  Supple.  Tender enlarged posterior nodes are palpated bilaterally  Lungs:  Clear to auscultation.  Breath sounds are equal. Chest:  Mild tenderness to palpation over the mid-sternum.   Heart:  Regular rate and rhythm without murmurs, rubs, or gallops.  Abdomen:  Nontender without masses or hepatosplenomegaly.  Bowel sounds are present.  No CVA or flank tenderness.   Extremities:  No edema.  No calf tenderness Skin:  No rash present.   ED Course  Procedures  none    Labs Reviewed  POCT RAPID STREP A (OFFICE) negative         MDM   Final diagnoses:  Influenza-like illness    Begin Tamiflu.  Prescription written for Benzonatate [facility]) to take at bedtime for night-time cough.  Take plain Mucinex (1200 mg guaifenesin) twice daily for cough and congestion.  Increase fluid intake, rest. May use Afrin nasal spray (or generic oxymetazoline) twice daily for about 5 days.  Also recommend using saline nasal spray several times daily and saline nasal irrigation (AYR is a common brand) Try warm salt water gargles for sore throat.  Stop all antihistamines for now, and other non-prescription cough/cold preparations. May take Ibuprofen 200mg , 4 tabs every 8 hours with food for chest/sternum discomfort, body aches, etc.   Follow-up with family doctor if not improving about 6 days.    Kandra Nicolas, MD 12/24/13 1010

## 2013-12-29 ENCOUNTER — Ambulatory Visit (INDEPENDENT_AMBULATORY_CARE_PROVIDER_SITE_OTHER): Payer: 59 | Admitting: Nurse Practitioner

## 2013-12-29 ENCOUNTER — Encounter: Payer: Self-pay | Admitting: Nurse Practitioner

## 2013-12-29 VITALS — BP 119/84 | HR 98 | Temp 98.0°F | Resp 16 | Ht 63.75 in | Wt 173.0 lb

## 2013-12-29 DIAGNOSIS — A499 Bacterial infection, unspecified: Secondary | ICD-10-CM

## 2013-12-29 DIAGNOSIS — H109 Unspecified conjunctivitis: Secondary | ICD-10-CM

## 2013-12-29 DIAGNOSIS — B9689 Other specified bacterial agents as the cause of diseases classified elsewhere: Secondary | ICD-10-CM

## 2013-12-29 DIAGNOSIS — H1089 Other conjunctivitis: Secondary | ICD-10-CM

## 2013-12-29 MED ORDER — ERYTHROMYCIN 5 MG/GM OP OINT: TOPICAL_OINTMENT | OPHTHALMIC | Status: DC

## 2013-12-29 NOTE — Patient Instructions (Addendum)
Start antibiotic ointment in eye. Please let us know if not better in 1 week. Wash lids & lashes with 1:1 solution of baby shampoo & water twice daily. You may use benzocaine throat lozenges for sore throat. I think virus has caused respiratory illness, and seems to be resolving.  Let us know if not better.  Bacterial Conjunctivitis Bacterial conjunctivitis, commonly called pink eye, is an inflammation of the clear membrane that covers the white part of the eye (conjunctiva). The inflammation can also happen on the underside of the eyelids. The blood vessels in the conjunctiva become inflamed causing the eye to become red or pink. Bacterial conjunctivitis may spread easily from one eye to another and from person to person (contagious).  CAUSES  Bacterial conjunctivitis is caused by bacteria. The bacteria may come from your own skin, your upper respiratory tract, or from someone else with bacterial conjunctivitis. SYMPTOMS  The normally white color of the eye or the underside of the eyelid is usually pink or red. The pink eye is usually associated with irritation, tearing, and some sensitivity to light. Bacterial conjunctivitis is often associated with a thick, yellowish discharge from the eye. The discharge may turn into a crust on the eyelids overnight, which causes your eyelids to stick together. If a discharge is present, there may also be some blurred vision in the affected eye. DIAGNOSIS  Bacterial conjunctivitis is diagnosed by your caregiver through an eye exam and the symptoms that you report. Your caregiver looks for changes in the surface tissues of your eyes, which may point to the specific type of conjunctivitis. A sample of any discharge may be collected on a cotton-tip swab if you have a severe case of conjunctivitis, if your cornea is affected, or if you keep getting repeat infections that do not respond to treatment. The sample will be sent to a lab to see if the inflammation is caused by a  bacterial infection and to see if the infection will respond to antibiotic medicines. TREATMENT   Bacterial conjunctivitis is treated with antibiotics. Antibiotic eyedrops are most often used. However, antibiotic ointments are also available. Antibiotics pills are sometimes used. Artificial tears or eye washes may ease discomfort. HOME CARE INSTRUCTIONS   To ease discomfort, apply a cool, clean wash cloth to your eye for 10 20 minutes, 3 4 times a day.  Gently wipe away any drainage from your eye with a warm, wet washcloth or a cotton ball.  Wash your hands often with soap and water. Use paper towels to dry your hands.  Do not share towels or wash cloths. This may spread the infection.  Change or wash your pillow case every day.  You should not use eye makeup until the infection is gone.  Do not operate machinery or drive if your vision is blurred.  Stop using contacts lenses. Ask your caregiver how to sterilize or replace your contacts before using them again. This depends on the type of contact lenses that you use.  When applying medicine to the infected eye, do not touch the edge of your eyelid with the eyedrop bottle or ointment tube. SEEK IMMEDIATE MEDICAL CARE IF:   Your infection has not improved within 3 days after beginning treatment.  You had yellow discharge from your eye and it returns.  You have increased eye pain.  Your eye redness is spreading.  Your vision becomes blurred.  You have a fever or persistent symptoms for more than 2 3 days.  You have a  fever and your symptoms suddenly get worse.  You have facial pain, redness, or swelling. MAKE SURE YOU:   Understand these instructions.  Will watch your condition.  Will get help right away if you are not doing well or get worse. Document Released: 10/26/2005 Document Revised: 07/20/2012 Document Reviewed: 03/28/2012 [facility] Patient Information 2014 [place], [place].   Viral Pharyngitis Viral pharyngitis  is a viral infection that produces redness, pain, and swelling (inflammation) of the throat. It can spread from person to person (contagious). CAUSES Viral pharyngitis is caused by inhaling a large amount of certain germs called viruses. Many different viruses cause viral pharyngitis. SYMPTOMS Symptoms of viral pharyngitis include:  Sore throat.  Tiredness.  Stuffy nose.  Low-grade fever.  Congestion.  Cough. TREATMENT Treatment includes rest, drinking plenty of fluids, and the use of over-the-counter medication (approved by your caregiver). HOME CARE INSTRUCTIONS   Drink enough fluids to keep your urine clear or pale yellow.  Eat soft, cold foods such as ice cream, frozen ice pops, or gelatin dessert.  Gargle with warm salt water (1 tsp salt per 1 qt of water).  If over age 29, throat lozenges may be used safely.  Only take over-the-counter or prescription medicines for pain, discomfort, or fever as directed by your caregiver. Do not take aspirin. To help prevent spreading viral pharyngitis to others, avoid:  Mouth-to-mouth contact with others.  Sharing utensils for eating and drinking.  Coughing around others. SEEK MEDICAL CARE IF:   You are better in a few days, then become worse.  You have a fever or pain not helped by pain medicines.  There are any other changes that concern you. Document Released: 08/05/2005 Document Revised: 01/18/2012 Document Reviewed: 01/01/2011 [facility] Patient Information 2014 [place], [place].

## 2013-12-29 NOTE — Progress Notes (Signed)
   Subjective:    Patient ID: Kaitlin Foster, female    DOB: 09-01-1981, 33 y.o.   MRN: [id number]  Conjunctivitis  The current episode started yesterday. The onset was sudden. The problem occurs continuously. The problem has been gradually worsening. The problem is moderate. Nothing relieves the symptoms. Nothing aggravates the symptoms. Associated symptoms include a fever (resolved 5da), eye itching, congestion (improving), sore throat, muscle aches (resolved), cough (resolving), URI (treated for flu 6da. took tamiflu. most symptoms resolved.), eye discharge and eye redness. Pertinent negatives include no decreased vision, no double vision, no photophobia, no abdominal pain, no diarrhea, no nausea, no ear pain, no headaches, no hearing loss, no mouth sores, no neck stiffness, no wheezing, no rash and no eye pain. The right eye is affected.The eye pain is not associated with movement. The eyelid exhibits swelling and redness. Sick contacts: son has been sick. Services performed: treated for flu 5da w/tamiflu.      Review of Systems  Constitutional: Positive for fever (resolved 5da) and fatigue (improving).  HENT: Positive for congestion (improving) and sore throat. Negative for ear pain, hearing loss, mouth sores and trouble swallowing.   Eyes: Positive for discharge, redness and itching. Negative for double vision, photophobia and pain.  Respiratory: Positive for cough (resolving). Negative for chest tightness, shortness of breath and wheezing.   Gastrointestinal: Negative for nausea, abdominal pain and diarrhea.  Musculoskeletal: Positive for myalgias (resolved).  Skin: Negative for rash.  Neurological: Negative for headaches.       Objective:   Physical Exam  Vitals reviewed. Constitutional: She is oriented to person, place, and time. She appears well-developed and well-nourished. No distress.  HENT:  Head: Normocephalic and atraumatic.  Right Ear: External ear normal.  Left Ear:  External ear normal.  Blisters bilat posterior phayrnx. Yellow fluid R TM, no bulge, bones visible.  Eyes: EOM are normal. Pupils are equal, round, and reactive to light. Right eye exhibits discharge. Left eye exhibits no discharge.  Crust on lashes, inj sclera & conjunctiva  Neck: Normal range of motion. Neck supple. No thyromegaly present.  Cardiovascular: Normal rate, regular rhythm and normal heart sounds.   No murmur heard. Pulmonary/Chest: Effort normal and breath sounds normal. No respiratory distress. She has no wheezes. She has no rales.  Lymphadenopathy:    She has cervical adenopathy (bilat ant cervical LAD, tender, moveable).  Neurological: She is alert and oriented to person, place, and time.  Skin: Skin is warm and dry.  Psychiatric: She has a normal mood and affect. Her behavior is normal. Thought content normal.          Assessment & Plan:  1. Bacterial conjunctivitis of right eye Crusted lashes, inj sclera & conjunctiva - erythromycin ophthalmic ointment; Place 1/2 inch ribbon in R eye twice daily. Continue to use for 24 hours after all symptoms resolve. Do not use longer than 7 days.  Dispense: 3.5 g; Refill: 0  2 resp illness Cough nasal congestion resolving. Blisters in throat. Treated for flu 5da. VX:[id number]

## 2013-12-29 NOTE — Progress Notes (Signed)
Pre visit review using our clinic review tool, if applicable. No additional management support is needed unless otherwise documented below in the visit note. 

## 2014-01-02 ENCOUNTER — Ambulatory Visit (INDEPENDENT_AMBULATORY_CARE_PROVIDER_SITE_OTHER): Payer: Self-pay | Admitting: Family Medicine

## 2014-01-02 DIAGNOSIS — Z713 Dietary counseling and surveillance: Secondary | ICD-10-CM

## 2014-01-03 ENCOUNTER — Ambulatory Visit (INDEPENDENT_AMBULATORY_CARE_PROVIDER_SITE_OTHER): Payer: 59 | Admitting: Physician Assistant

## 2014-01-03 ENCOUNTER — Encounter: Payer: Self-pay | Admitting: Physician Assistant

## 2014-01-03 VITALS — BP 136/92 | HR 93 | Temp 97.8°F | Resp 16 | Ht 63.75 in | Wt 172.8 lb

## 2014-01-03 DIAGNOSIS — J209 Acute bronchitis, unspecified: Secondary | ICD-10-CM

## 2014-01-03 DIAGNOSIS — J029 Acute pharyngitis, unspecified: Secondary | ICD-10-CM

## 2014-01-03 LAB — POCT RAPID STREP A (OFFICE): RAPID STREP A SCREEN: NEGATIVE

## 2014-01-03 MED ORDER — AZITHROMYCIN 250 MG PO TABS: ORAL_TABLET | ORAL | Status: DC

## 2014-01-03 NOTE — Patient Instructions (Signed)
Take antibiotic as prescribed.  Increase fluid intake.  Rest.  Saline nasal spray. Mucinex. Humidifier in bedroom. Will will send your strep swab for culture.  I will call you with your results.  Please call or return to clinic if symptoms are not improving.  Bronchitis Bronchitis is inflammation of the airways that extend from the windpipe into the lungs (bronchi). The inflammation often causes mucus to develop, which leads to a cough. If the inflammation becomes severe, it may cause shortness of breath. CAUSES  Bronchitis may be caused by:   Viral infections.   Bacteria.   Cigarette smoke.   Allergens, pollutants, and other irritants.  SIGNS AND SYMPTOMS  The most common symptom of bronchitis is a frequent cough that produces mucus. Other symptoms include:  Fever.   Body aches.   Chest congestion.   Chills.   Shortness of breath.   Sore throat.  DIAGNOSIS  Bronchitis is usually diagnosed through a medical history and physical exam. Tests, such as chest X-rays, are sometimes done to rule out other conditions.  TREATMENT  You may need to avoid contact with whatever caused the problem (smoking, for example). Medicines are sometimes needed. These may include:  Antibiotics. These may be prescribed if the condition is caused by bacteria.  Cough suppressants. These may be prescribed for relief of cough symptoms.   Inhaled medicines. These may be prescribed to help open your airways and make it easier for you to breathe.   Steroid medicines. These may be prescribed for those with recurrent (chronic) bronchitis. HOME CARE INSTRUCTIONS  Get plenty of rest.   Drink enough fluids to keep your urine clear or pale yellow (unless you have a medical condition that requires fluid restriction). Increasing fluids may help thin your secretions and will prevent dehydration.   Only take over-the-counter or prescription medicines as directed by your health care  provider.  Only take antibiotics as directed. Make sure you finish them even if you start to feel better.  Avoid secondhand smoke, irritating chemicals, and strong fumes. These will make bronchitis worse. If you are a smoker, quit smoking. Consider using nicotine gum or skin patches to help control withdrawal symptoms. Quitting smoking will help your lungs heal faster.   Put a cool-mist humidifier in your bedroom at night to moisten the air. This may help loosen mucus. Change the water in the humidifier daily. You can also run the hot water in your shower and sit in the bathroom with the door closed for 5 10 minutes.   Follow up with your health care provider as directed.   Wash your hands frequently to avoid catching bronchitis again or spreading an infection to others.  SEEK MEDICAL CARE IF: Your symptoms do not improve after 1 week of treatment.  SEEK IMMEDIATE MEDICAL CARE IF:  Your fever increases.  You have chills.   You have chest pain.   You have worsening shortness of breath.   You have bloody sputum.  You faint.  You have lightheadedness.  You have a severe headache.   You vomit repeatedly. MAKE SURE YOU:   Understand these instructions.  Will watch your condition.  Will get help right away if you are not doing well or get worse. Document Released: 10/26/2005 Document Revised: 08/16/2013 Document Reviewed: 06/20/2013 [facility] Patient Information 2014 [place].  Pharyngitis Pharyngitis is redness, pain, and swelling (inflammation) of your pharynx.  CAUSES  Pharyngitis is usually caused by infection. Most of the time, these infections are from viruses (  viral) and are part of a cold. However, sometimes pharyngitis is caused by bacteria (bacterial). Pharyngitis can also be caused by allergies. Viral pharyngitis may be spread from person to person by coughing, sneezing, and personal items or utensils (cups, forks, spoons, toothbrushes). Bacterial  pharyngitis may be spread from person to person by more intimate contact, such as kissing.  SIGNS AND SYMPTOMS  Symptoms of pharyngitis include:   Sore throat.   Tiredness (fatigue).   Low-grade fever.   Headache.  Joint pain and muscle aches.  Skin rashes.  Swollen lymph nodes.  Plaque-like film on throat or tonsils (often seen with bacterial pharyngitis). DIAGNOSIS  Your health care provider will ask you questions about your illness and your symptoms. Your medical history, along with a physical exam, is often all that is needed to diagnose pharyngitis. Sometimes, a rapid strep test is done. Other lab tests may also be done, depending on the suspected cause.  TREATMENT  Viral pharyngitis will usually get better in 3 4 days without the use of medicine. Bacterial pharyngitis is treated with medicines that kill germs (antibiotics).  HOME CARE INSTRUCTIONS   Drink enough water and fluids to keep your urine clear or pale yellow.   Only take over-the-counter or prescription medicines as directed by your health care provider:   If you are prescribed antibiotics, make sure you finish them even if you start to feel better.   Do not take aspirin.   Get lots of rest.   Gargle with 8 oz of salt water ( tsp of salt per 1 qt of water) as often as every 1 2 hours to soothe your throat.   Throat lozenges (if you are not at risk for choking) or sprays may be used to soothe your throat. SEEK MEDICAL CARE IF:   You have large, tender lumps in your neck.  You have a rash.  You cough up green, yellow-brown, or bloody spit. SEEK IMMEDIATE MEDICAL CARE IF:   Your neck becomes stiff.  You drool or are unable to swallow liquids.  You vomit or are unable to keep medicines or liquids down.  You have severe pain that does not go away with the use of recommended medicines.  You have trouble breathing (not caused by a stuffy nose). MAKE SURE YOU:   Understand these  instructions.  Will watch your condition.  Will get help right away if you are not doing well or get worse. Document Released: 10/26/2005 Document Revised: 08/16/2013 Document Reviewed: 07/03/2013 [facility] Patient Information 2014 [place].

## 2014-01-03 NOTE — Progress Notes (Signed)
Pre visit review using our clinic review tool, if applicable. No additional management support is needed unless otherwise documented below in the visit note/SLS  

## 2014-01-04 DIAGNOSIS — J209 Acute bronchitis, unspecified: Secondary | ICD-10-CM | POA: Insufficient documentation

## 2014-01-04 NOTE — Assessment & Plan Note (Signed)
Rapid strep negative. We'll send for culture. Patient is being prescribed azithromycin for acute bronchitis. Increase fluid intake. Alternate Tylenol and ibuprofen for throat pain. Discussed that if throat pain persists, will need to obtain Monospot testing. Discussed other causes of a more chronic sore throat. Low suspicion for peritonsillar abscess or other, more concerning pathology giving relatively unremarkable physical examination.

## 2014-01-04 NOTE — Assessment & Plan Note (Signed)
Symptoms have been present long enough to suspect a bacterial etiology. Rx azithromycin. Continue Tessalon Perles. Increase fluid intake. Rest. Saline nasal spray. Probiotic. Mucinex. Place a humidifier in the bedroom. Please call or return to clinic if symptoms not improving.

## 2014-01-04 NOTE — Progress Notes (Signed)
Patient presents to clinic today c/o several weeks of productive cough, chest congestion, nasal congestion and sore throat. Patient states her cough is productive of a dark green phlegm. Patient was seen and evaluated 2 weeks ago for influenza. Patient tested positive for fluid was given prescription for Tamiflu. States her flu symptoms resolved. Since that time, she has suffered from a persistent productive cough and severe sore throat. Patient was evaluated for a 1 week ago by another clinician and was diagnosed with a viral pharyngitis. Rapid strep at that time was negative. Patient complains of continued symptoms. Symptom duration is now 3 weeks. Patient denies pleuritic chest pain. Denies fever. Denies chills or aches. Patient does endorse fatigue. Patient sore throat is bilateral. Denies difficulty swallowing, but endorses odynophagia.  Past Medical History  Diagnosis Date  . Hypertension   . AVM (arteriovenous malformation)     leaky  . Migraines     Current Outpatient Prescriptions on File Prior to Visit  Medication Sig Dispense Refill  . benzonatate (TESSALON) 200 MG capsule Take 1 capsule (200 mg total) by mouth at bedtime. Take as needed for cough  12 capsule  0  . cetirizine (ZYRTEC) 10 MG tablet Take 10 mg by mouth daily as needed.       . diltiazem (CARDIZEM CD) 120 MG 24 hr capsule TAKE 1 CAPSULE BY MOUTH DAILY  90 capsule  1  . fluticasone (FLONASE) 50 MCG/ACT nasal spray 2 sprays into each nostril daily  16 g  2  . meloxicam (MOBIC) 15 MG tablet Take 1 tablet (15 mg total) by mouth daily as needed for pain (with food).  30 tablet  1  . methocarbamol (ROBAXIN) 500 MG tablet Take 1 tablet (500 mg total) by mouth 2 (two) times daily as needed. Muscle spasm  40 tablet  1  . SUMAtriptan (IMITREX) 50 MG tablet One tablet at start of headache.  May repeat once in 2 hours as needed  10 tablet  0   No current facility-administered medications on file prior to visit.    Allergies   Allergen Reactions  . Codeine     REACTION: upset stomach once, but has taken it other times and been ok  . Lisinopril   . Sulfonamide Derivatives     Family History  Problem Relation Age of Onset  . Hypertension Father   . Alcohol abuse Father   . Arthritis Father   . Hypertension Mother   . Arthritis Mother   . Cancer Neg Hx   . Heart disease Neg Hx     History   Social History  . Marital Status: Married    Spouse Name: N/A    Number of Children: N/A  . Years of Education: N/A   Social History Main Topics  . Smoking status: Never Smoker   . Smokeless tobacco: Never Used  . Alcohol Use: No  . Drug Use: None  . Sexual Activity: Yes    Birth Control/ Protection: Condom   Other Topics Concern  . None   Social History Narrative   Regular exercise: 2 x weekly   Caffeine use:  1 coffee daily          Review of Systems - See HPI.  All other ROS are negative.  BP 136/92  Pulse 93  Temp(Src) 97.8 F (36.6 C) (Oral)  Resp 16  Ht 5' 3.75" (1.619 m)  Wt 172 lb 12 oz (78.359 kg)  BMI 29.89 kg/m2  SpO2 98%  LMP  12/15/2013  Physical Exam  Constitutional: She is oriented to person, place, and time and well-developed, well-nourished, and in no distress.  HENT:  Head: Normocephalic and atraumatic.  Right Ear: External ear normal.  Left Ear: External ear normal.  Nose: Nose normal.  Mouth/Throat: Uvula is midline and mucous membranes are normal. No oral lesions. No uvula swelling. Posterior oropharyngeal erythema present. No oropharyngeal exudate, posterior oropharyngeal edema or tonsillar abscesses.  Tympanic membranes within normal limits. No tenderness noted to percussion of sinuses.  Eyes: Conjunctivae are normal. Pupils are equal, round, and reactive to light.  Neck: Neck supple.  Cardiovascular: Normal rate, regular rhythm, normal heart sounds and intact distal pulses.   Pulmonary/Chest: Effort normal and breath sounds normal. No respiratory distress. She  has no wheezes. She has no rales. She exhibits no tenderness.  Lymphadenopathy:    She has cervical adenopathy.  Neurological: She is alert and oriented to person, place, and time.  Skin: Skin is warm and dry. No rash noted.  Psychiatric: Affect normal.    Recent Results (from the past 2160 hour(s))  POCT RAPID STREP A (OFFICE)     Status: None   Collection Time    12/23/13  1:31 PM      Result Value Ref Range   Rapid Strep A Screen Negative  Negative  POCT RAPID STREP A (OFFICE)     Status: None   Collection Time    01/03/14  6:20 PM      Result Value Ref Range   Rapid Strep A Screen Negative  Negative    Assessment/Plan: Acute bronchitis Symptoms have been present long enough to suspect a bacterial etiology. Rx azithromycin. Continue Tessalon Perles. Increase fluid intake. Rest. Saline nasal spray. Probiotic. Mucinex. Place a humidifier in the bedroom. Please call or return to clinic if symptoms not improving.  Sore throat Rapid strep negative. We'll send for culture. Patient is being prescribed azithromycin for acute bronchitis. Increase fluid intake. Alternate Tylenol and ibuprofen for throat pain. Discussed that if throat pain persists, will need to obtain Monospot testing. Discussed other causes of a more chronic sore throat. Low suspicion for peritonsillar abscess or other, more concerning pathology giving relatively unremarkable physical examination.

## 2014-01-22 ENCOUNTER — Encounter: Payer: Self-pay | Admitting: Emergency Medicine

## 2014-01-22 ENCOUNTER — Emergency Department (INDEPENDENT_AMBULATORY_CARE_PROVIDER_SITE_OTHER): Payer: 59

## 2014-01-22 ENCOUNTER — Emergency Department
Admission: EM | Admit: 2014-01-22 | Discharge: 2014-01-22 | Disposition: A | Discharge status: Home / Self Care | Payer: 59 | Source: Home / Self Care | Attending: Family Medicine | Admitting: Family Medicine

## 2014-01-22 DIAGNOSIS — S139XXA Sprain of joints and ligaments of unspecified parts of neck, initial encounter: Secondary | ICD-10-CM

## 2014-01-22 DIAGNOSIS — M549 Dorsalgia, unspecified: Secondary | ICD-10-CM

## 2014-01-22 DIAGNOSIS — S239XXA Sprain of unspecified parts of thorax, initial encounter: Secondary | ICD-10-CM

## 2014-01-22 DIAGNOSIS — S161XXA Strain of muscle, fascia and tendon at neck level, initial encounter: Secondary | ICD-10-CM

## 2014-01-22 DIAGNOSIS — M542 Cervicalgia: Secondary | ICD-10-CM

## 2014-01-22 DIAGNOSIS — S29012A Strain of muscle and tendon of back wall of thorax, initial encounter: Secondary | ICD-10-CM

## 2014-01-22 MED ORDER — MELOXICAM 15 MG PO TABS: ORAL_TABLET | ORAL | Status: DC

## 2014-01-22 MED ORDER — METAXALONE 800 MG PO TABS: 800.0000 mg | ORAL_TABLET | Freq: Three times a day (TID) | ORAL | Status: DC
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
## 2014-01-22 NOTE — Discharge Instructions (Signed)
Apply ice pack to neck and upper back 3 or 4 times daily for about two days.  Begin range of motion and stretching exercises.
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
## 2014-01-26 ENCOUNTER — Telehealth: Payer: Self-pay | Admitting: Emergency Medicine

## 2014-02-19 ENCOUNTER — Ambulatory Visit (INDEPENDENT_AMBULATORY_CARE_PROVIDER_SITE_OTHER): Payer: 59 | Admitting: Family

## 2014-02-19 ENCOUNTER — Encounter: Payer: Self-pay | Admitting: Family

## 2014-02-19 VITALS — BP 114/64 | HR 62 | Temp 98.2°F | Resp 16 | Wt 163.1 lb

## 2014-02-19 DIAGNOSIS — J019 Acute sinusitis, unspecified: Secondary | ICD-10-CM

## 2014-02-19 MED ORDER — AMOXICILLIN 500 MG PO CAPS: 500.0000 mg | ORAL_CAPSULE | Freq: Three times a day (TID) | ORAL | Status: DC

## 2014-02-19 NOTE — Progress Notes (Signed)
Subjective:    Patient ID: Kaitlin Foster, female    DOB: [birth year], 33 y.o.   MRN: [id number]  HPI  Kaitlin Foster is a 33 yr old female who presents today with chief complaint of sinus drainage.  She denies associated fever.  Symptoms started 2 weeks ago. No improvement despite daily use of zyrtec and flonase. Has some ear pressure and feels mild dizziness.    Review of Systems See HPI  Past Medical History  Diagnosis Date  . Hypertension   . AVM (arteriovenous malformation)     leaky  . Migraines     History   Social History  . Marital Status: Married    Spouse Name: N/A    Number of Children: N/A  . Years of Education: N/A   Occupational History  . Not on file.   Social History Main Topics  . Smoking status: Never Smoker   . Smokeless tobacco: Never Used  . Alcohol Use: No  . Drug Use: Not on file  . Sexual Activity: Yes    Birth Control/ Protection: Condom   Other Topics Concern  . Not on file   Social History Narrative   Regular exercise: 2 x weekly   Caffeine use:  1 coffee daily          Past Surgical History  Procedure Laterality Date  . Emobilization    . Craniotomy  2008    left frontal AVM    Family History  Problem Relation Age of Onset  . Hypertension Father   . Alcohol abuse Father   . Arthritis Father   . Hypertension Mother   . Arthritis Mother   . Cancer Neg Hx   . Heart disease Neg Hx     Allergies  Allergen Reactions  . Codeine     REACTION: upset stomach once, but has taken it other times and been ok  . Lisinopril   . Sulfonamide Derivatives     Current Outpatient Prescriptions on File Prior to Visit  Medication Sig Dispense Refill  . cetirizine (ZYRTEC) 10 MG tablet Take 10 mg by mouth daily as needed.       . diltiazem (CARDIZEM CD) 120 MG 24 hr capsule TAKE 1 CAPSULE BY MOUTH DAILY  90 capsule  1  . fluticasone (FLONASE) 50 MCG/ACT nasal spray 2 sprays into each nostril daily  16 g  2  . meloxicam (MOBIC) 15 MG  tablet Take one tab by mouth once daily with breakfast  10 tablet  1  . metaxalone (SKELAXIN) 800 MG tablet Take 1 tablet (800 mg total) by mouth 3 (three) times daily.  15 tablet  0   No current facility-administered medications on file prior to visit.    BP 114/64  Pulse 62  Temp(Src) 98.2 F (36.8 C) (Oral)  Resp 16  Wt 163 lb 1.3 oz (73.973 kg)  SpO2 99%  LMP 01/10/2014       Objective:   Physical Exam  Constitutional: She appears well-developed and well-nourished. No distress.  HENT:  Head: Normocephalic and atraumatic.  Right Ear: Tympanic membrane and ear canal normal.  Left Ear: Tympanic membrane and ear canal normal.  Mouth/Throat: No oropharyngeal exudate, posterior oropharyngeal edema or posterior oropharyngeal erythema.  Mild frontal and maxillary sinus tenderness to palpation.  Eyes: No scleral icterus.  Cardiovascular: Normal rate and regular rhythm.   No murmur heard. Pulmonary/Chest: Effort normal and breath sounds normal. No respiratory distress. She has no wheezes. She has no  rales. She exhibits no tenderness.          Assessment & Plan:

## 2014-02-19 NOTE — Assessment & Plan Note (Signed)
Continue zyrtec, flonase. Add amoxicillin.

## 2014-02-19 NOTE — Progress Notes (Signed)
Pre visit review using our clinic review tool, if applicable. No additional management support is needed unless otherwise documented below in the visit note. 

## 2014-02-19 NOTE — Patient Instructions (Signed)

## 2014-03-12 ENCOUNTER — Other Ambulatory Visit: Payer: Self-pay | Admitting: Family Medicine

## 2014-03-20 ENCOUNTER — Encounter: Payer: Self-pay | Admitting: Family

## 2014-03-20 ENCOUNTER — Ambulatory Visit (INDEPENDENT_AMBULATORY_CARE_PROVIDER_SITE_OTHER): Payer: 59 | Admitting: Family

## 2014-03-20 VITALS — BP 110/76 | HR 80 | Temp 97.7°F | Resp 16 | Ht 64.0 in | Wt 160.0 lb

## 2014-03-20 DIAGNOSIS — Z0000 Encounter for general adult medical examination without abnormal findings: Secondary | ICD-10-CM

## 2014-03-20 DIAGNOSIS — Z23 Encounter for immunization: Secondary | ICD-10-CM

## 2014-03-20 LAB — URINALYSIS, ROUTINE W REFLEX MICROSCOPIC
BILIRUBIN URINE: NEGATIVE
Glucose, UA: NEGATIVE mg/dL
Hgb urine dipstick: NEGATIVE
KETONES UR: NEGATIVE mg/dL
Leukocytes, UA: NEGATIVE
Nitrite: NEGATIVE
Protein, ur: NEGATIVE mg/dL
Specific Gravity, Urine: 1.017 (ref 1.005–1.030)
UROBILINOGEN UA: 0.2 mg/dL (ref 0.0–1.0)
pH: 6.5 (ref 5.0–8.0)

## 2014-03-20 LAB — CBC WITH DIFFERENTIAL/PLATELET
Basophils Absolute: 0 10*3/uL (ref 0.0–0.1)
Basophils Relative: 0 % (ref 0–1)
EOS PCT: 2 % (ref 0–5)
Eosinophils Absolute: 0.1 10*3/uL (ref 0.0–0.7)
HCT: 36.4 % (ref 36.0–46.0)
HEMOGLOBIN: 11.6 g/dL — AB (ref 12.0–15.0)
LYMPHS PCT: 31 % (ref 12–46)
Lymphs Abs: 1.5 10*3/uL (ref 0.7–4.0)
MCH: 23.9 pg — ABNORMAL LOW (ref 26.0–34.0)
MCHC: 31.9 g/dL (ref 30.0–36.0)
MCV: 74.9 fL — AB (ref 78.0–100.0)
MONOS PCT: 8 % (ref 3–12)
Monocytes Absolute: 0.4 10*3/uL (ref 0.1–1.0)
NEUTROS ABS: 2.8 10*3/uL (ref 1.7–7.7)
Neutrophils Relative %: 59 % (ref 43–77)
Platelets: 273 10*3/uL (ref 150–400)
RBC: 4.86 MIL/uL (ref 3.87–5.11)
RDW: 15.8 % — ABNORMAL HIGH (ref 11.5–15.5)
WBC: 4.7 10*3/uL (ref 4.0–10.5)

## 2014-03-20 LAB — BASIC METABOLIC PANEL WITH GFR
BUN: 12 mg/dL (ref 6–23)
CO2: 23 mEq/L (ref 19–32)
CREATININE: 0.72 mg/dL (ref 0.50–1.10)
Calcium: 9 mg/dL (ref 8.4–10.5)
Chloride: 106 mEq/L (ref 96–112)
GLUCOSE: 83 mg/dL (ref 70–99)
POTASSIUM: 4.2 meq/L (ref 3.5–5.3)
Sodium: 141 mEq/L (ref 135–145)

## 2014-03-20 LAB — LIPID PANEL
Cholesterol: 118 mg/dL (ref 0–200)
HDL: 25 mg/dL — AB (ref >39)
LDL CALC: 71 mg/dL (ref 0–99)
TRIGLYCERIDES: 109 mg/dL (ref <150)
Total CHOL/HDL Ratio: 4.7 Ratio
VLDL: 22 mg/dL (ref 0–40)

## 2014-03-20 LAB — HEPATIC FUNCTION PANEL
ALK PHOS: 65 U/L (ref 39–117)
ALT: 14 U/L (ref 0–35)
AST: 21 U/L (ref 0–37)
Albumin: 4.1 g/dL (ref 3.5–5.2)
BILIRUBIN INDIRECT: 0.3 mg/dL (ref 0.2–1.2)
Bilirubin, Direct: 0.1 mg/dL (ref 0.0–0.3)
TOTAL PROTEIN: 6.5 g/dL (ref 6.0–8.3)
Total Bilirubin: 0.4 mg/dL (ref 0.2–1.2)

## 2014-03-20 LAB — TSH: TSH: 0.88 u[IU]/mL (ref 0.350–4.500)

## 2014-03-20 MED ORDER — FLUTICASONE PROPIONATE 50 MCG/ACT NA SUSP: NASAL | Status: DC

## 2014-03-20 NOTE — Patient Instructions (Signed)
Please complete your lab work prior to leaving. Follow up in 6 months.  

## 2014-03-20 NOTE — Assessment & Plan Note (Signed)
Continue healthy diet, exercise, weight loss.  Obtain fasting lab work.

## 2014-03-20 NOTE — Progress Notes (Signed)
Pre visit review using our clinic review tool, if applicable. No additional management support is needed unless otherwise documented below in the visit note. 

## 2014-03-20 NOTE — Progress Notes (Signed)
Subjective:    Patient ID: [name], female    DOB: 15-Nov-1980, 33 y.o.   MRN: [id number]  HPI  Ms. Stoker is a 33 yr old female who presents today for cpx.  Immunizations: due for tetanus Diet:  Weight watchers Exercise:Body mass index is 27.45 kg/(m^2). Wt Readings from Last 3 Encounters:  03/20/14 160 lb (72.576 kg)  02/19/14 163 lb 1.3 oz (73.973 kg)  01/22/14 166 lb (75.297 kg)  Pap Smear: GYN- Due.  Past Medical History  Diagnosis Date  . Hypertension   . AVM (arteriovenous malformation)     leaky  . Migraines     History   Social History  . Marital Status: Married    Spouse Name: N/A    Number of Children: N/A  . Years of Education: N/A   Occupational History  . Not on file.   Social History Main Topics  . Smoking status: Never Smoker   . Smokeless tobacco: Never Used  . Alcohol Use: No  . Drug Use: Not on file  . Sexual Activity: Yes    Birth Control/ Protection: Condom   Other Topics Concern  . Not on file   Social History Narrative   Regular exercise: 2 x weekly   Caffeine use:  1 coffee daily          Past Surgical History  Procedure Laterality Date  . Emobilization    . Craniotomy  2008    left frontal AVM    Family History  Problem Relation Age of Onset  . Hypertension Father   . Alcohol abuse Father   . Arthritis Father   . Hypertension Mother   . Arthritis Mother   . Cancer Neg Hx   . Heart disease Neg Hx     Allergies  Allergen Reactions  . Codeine     REACTION: upset stomach once, but has taken it other times and been ok  . Lisinopril   . Sulfonamide Derivatives     Current Outpatient Prescriptions on File Prior to Visit  Medication Sig Dispense Refill  . cetirizine (ZYRTEC) 10 MG tablet Take 10 mg by mouth daily as needed.       . diltiazem (CARDIZEM CD) 120 MG 24 hr capsule TAKE 1 CAPSULE BY MOUTH DAILY  90 capsule  3  . fluticasone (FLONASE) 50 MCG/ACT nasal spray 2 sprays into each nostril daily  16 g  2    . meloxicam (MOBIC) 15 MG tablet Take one tab by mouth once daily with breakfast  10 tablet  1  . metaxalone (SKELAXIN) 800 MG tablet Take 1 tablet (800 mg total) by mouth 3 (three) times daily.  15 tablet  0   No current facility-administered medications on file prior to visit.    BP 110/76  Pulse 80  Temp(Src) 97.7 F (36.5 C) (Oral)  Resp 16  Ht 5\' 4"  (1.626 m)  Wt 160 lb (72.576 kg)  BMI 27.45 kg/m2  SpO2 98%  LMP 03/17/2014    Review of Systems  Constitutional: Negative for unexpected weight change.  HENT: Negative for hearing loss and rhinorrhea.   Eyes: Negative for visual disturbance.  Respiratory: Negative for cough and shortness of breath.   Cardiovascular: Negative for chest pain.  Gastrointestinal: Negative for diarrhea and constipation.  Genitourinary: Negative for menstrual problem.  Musculoskeletal: Positive for back pain. Negative for arthralgias and myalgias.       Uses skelaxin prn which helps with back pain  Skin: Negative  for rash.  Neurological:       Occasional HA's uses ibuprofen or tylenol with good relief 2-3 times a month  Hematological: Negative for adenopathy.  Psychiatric/Behavioral:       Denies depression/anxiety   Past Medical History  Diagnosis Date  . Hypertension   . AVM (arteriovenous malformation)     leaky  . Migraines     History   Social History  . Marital Status: Married    Spouse Name: N/A    Number of Children: N/A  . Years of Education: N/A   Occupational History  . Not on file.   Social History Main Topics  . Smoking status: Never Smoker   . Smokeless tobacco: Never Used  . Alcohol Use: No  . Drug Use: Not on file  . Sexual Activity: Yes    Birth Control/ Protection: Condom   Other Topics Concern  . Not on file   Social History Narrative   Regular exercise: 2 x weekly   Caffeine use:  1 coffee daily          Past Surgical History  Procedure Laterality Date  . Emobilization    . Craniotomy  2008     left frontal AVM    Family History  Problem Relation Age of Onset  . Hypertension Father   . Alcohol abuse Father   . Arthritis Father   . Hypertension Mother   . Arthritis Mother   . Cancer Neg Hx   . Heart disease Neg Hx     Allergies  Allergen Reactions  . Codeine     REACTION: upset stomach once, but has taken it other times and been ok  . Lisinopril   . Sulfonamide Derivatives     Current Outpatient Prescriptions on File Prior to Visit  Medication Sig Dispense Refill  . cetirizine (ZYRTEC) 10 MG tablet Take 10 mg by mouth daily as needed.       . diltiazem (CARDIZEM CD) 120 MG 24 hr capsule TAKE 1 CAPSULE BY MOUTH DAILY  90 capsule  3  . fluticasone (FLONASE) 50 MCG/ACT nasal spray 2 sprays into each nostril daily  16 g  2  . meloxicam (MOBIC) 15 MG tablet Take one tab by mouth once daily with breakfast  10 tablet  1  . metaxalone (SKELAXIN) 800 MG tablet Take 1 tablet (800 mg total) by mouth 3 (three) times daily.  15 tablet  0   No current facility-administered medications on file prior to visit.    BP 110/76  Pulse 80  Temp(Src) 97.7 F (36.5 C) (Oral)  Resp 16  Ht 5\' 4"  (1.626 m)  Wt 160 lb (72.576 kg)  BMI 27.45 kg/m2  SpO2 98%  LMP 03/17/2014       Objective:   Physical Exam  Physical Exam  Constitutional: She is oriented to person, place, and time. She appears well-developed and well-nourished. No distress.  HENT:  Head: Normocephalic and atraumatic.  Right Ear: Tympanic membrane and ear canal normal.  Left Ear: Tympanic membrane and ear canal normal.  Mouth/Throat: Oropharynx is clear and moist.  Eyes: Pupils are equal, round, and reactive to light. No scleral icterus.  Neck: Normal range of motion. No thyromegaly present.  Cardiovascular: Normal rate and regular rhythm.   No murmur heard. Pulmonary/Chest: Effort normal and breath sounds normal. No respiratory distress. He has no wheezes. She has no rales. She exhibits no tenderness.   Abdominal: Soft. Bowel sounds are normal. He exhibits no distension  and no mass. There is no tenderness. There is no rebound and no guarding.  Musculoskeletal: She exhibits no edema.  Lymphadenopathy:    She has no cervical adenopathy.  Neurological: She is alert and oriented to person, place, and time. She has normal patellar reflexes. She exhibits normal muscle tone. Coordination normal.  Skin: Skin is warm and dry.  Psychiatric: She has a normal mood and affect. Her behavior is normal. Judgment and thought content normal.  Breast/pelvic: deferred to GYN          Assessment & Plan:         Assessment & Plan:

## 2014-03-21 ENCOUNTER — Encounter: Payer: Self-pay | Admitting: Family

## 2014-03-21 DIAGNOSIS — D649 Anemia, unspecified: Secondary | ICD-10-CM | POA: Insufficient documentation

## 2014-03-27 ENCOUNTER — Telehealth: Payer: Self-pay | Admitting: *Deleted

## 2014-03-27 DIAGNOSIS — D649 Anemia, unspecified: Secondary | ICD-10-CM

## 2014-03-27 NOTE — Telephone Encounter (Signed)
Lab order entered.

## 2014-03-27 NOTE — Telephone Encounter (Signed)
Message copied by Ronny Flurry on Tue Mar 27, 2014  4:46 PM ------      Message from: O'SULLIVAN, MELISSA      Created: Wed Mar 21, 2014  7:33 AM       I have asked her to return in 3 months for cbc dx is anemia ------

## 2014-06-20 LAB — CBC WITH DIFFERENTIAL/PLATELET
BASOS ABS: 0 10*3/uL (ref 0.0–0.1)
Basophils Relative: 0 % (ref 0–1)
EOS ABS: 0.1 10*3/uL (ref 0.0–0.7)
EOS PCT: 1 % (ref 0–5)
HCT: 41.3 % (ref 36.0–46.0)
Hemoglobin: 13.8 g/dL (ref 12.0–15.0)
Lymphocytes Relative: 29 % (ref 12–46)
Lymphs Abs: 2.3 10*3/uL (ref 0.7–4.0)
MCH: 28.2 pg (ref 26.0–34.0)
MCHC: 33.4 g/dL (ref 30.0–36.0)
MCV: 84.3 fL (ref 78.0–100.0)
Monocytes Absolute: 0.6 10*3/uL (ref 0.1–1.0)
Monocytes Relative: 8 % (ref 3–12)
Neutro Abs: 4.9 10*3/uL (ref 1.7–7.7)
Neutrophils Relative %: 62 % (ref 43–77)
Platelets: 249 10*3/uL (ref 150–400)
RBC: 4.9 MIL/uL (ref 3.87–5.11)
RDW: 15.7 % — AB (ref 11.5–15.5)
WBC: 7.9 10*3/uL (ref 4.0–10.5)

## 2014-06-21 ENCOUNTER — Encounter: Payer: Self-pay | Admitting: Family

## 2014-09-17 ENCOUNTER — Telehealth: Payer: Self-pay | Admitting: Family

## 2014-09-17 DIAGNOSIS — M5441 Lumbago with sciatica, right side: Secondary | ICD-10-CM

## 2014-09-17 MED ORDER — ETODOLAC 300 MG PO CAPS: 300.0000 mg | ORAL_CAPSULE | Freq: Three times a day (TID) | ORAL | Status: DC

## 2014-09-17 MED ORDER — CYCLOBENZAPRINE HCL 10 MG PO TABS: 10.0000 mg | ORAL_TABLET | Freq: Three times a day (TID) | ORAL | Status: DC | PRN

## 2014-09-17 NOTE — Progress Notes (Signed)
We are sorry that you are not feeling well.  Here is how we plan to help!  Based on what you have shared with me it looks like you mostly have acute back pain.  Acute back pain is defined as musculoskeletal pain that can resolve in 1-3 weeks with conservative treatment.  I have prescribed Etodolac 300 mg twice a day non-steroid anti-inflammatory (NSAID) as well as Flexeril 10 mg every eight hours as needed which is a muscle relaxer.  Some patients experience stomach irritation or in increased heartburn with anti-inflammatory drugs.  Please keep in mind that muscle relaxer's can cause fatigue and should not be taken while at work or driving.  Back pain is very common.  The pain often gets better over time.  The cause of back pain is usually not dangerous.  Most people can learn to manage their back pain on their own.  *Do not take mobic and skelaxin with etodolac and flexeril.  Home Care  Stay active.  Start with short walks on flat ground if you can.  Try to walk farther each day.  Do not sit, drive or stand in one place for more than 30 minutes.  Do not stay in bed.  Do not avoid exercise or work.  Activity can help your back heal faster.  Be careful when you bend or lift an object.  Bend at your knees, keep the object close to you, and do not twist.  Sleep on a firm mattress.  Lie on your side, and bend your knees.  If you lie on your back, put a pillow under your knees.  Only take medicines as told by your doctor.  Put ice on the injured area.  Put ice in a plastic bag  Place a towel between your skin and the bag  Leave the ice on for 15-20 minutes, 3-4 times a day for the first 2-3 days.  After that, you can switch between ice and heat packs.  Ask your doctor about back exercises or massage.  Avoid feeling anxious or stressed.  Find good ways to deal with stress, such as exercise.  Get Help Right Way If:  Your pain does not go away with rest or medicine.  Your pain does  not go away in 1 week.  You have new problems.  You do not feel well.  The pain spreads into your legs.  You cannot control when you poop (bowel movement) or pee (urinate)  You feel sick to your stomach (nauseous) or throw up (vomit)  You have belly (abdominal) pain.  You feel like you may pass out (faint).  If you develop a fever.  Make Sure you:  Understand these instructions.  Will watch your condition  Will get help right away if you are not doing well or get worse.  Your e-visit answers were reviewed by a board certified advanced clinical practitioner to complete your personal care plan.  Depending on the condition, your plan could have included both over the counter or prescription medications.  Please review your pharmacy choice.  If there is a problem, you may call our nursing hot line at [phone] and have the prescription routed to another pharmacy.  Your safety is important to [place].  If you have drug allergies check your prescription carefully.    You can use MyChart to ask questions about today's visit, request a non-urgent call back, or ask for a work or school excuse.  You will get an e-mail in the next  two days asking about your experience.  I hope that your e-visit has been valuable and will speed your recovery. Thank you for using e-visits.

## 2014-10-03 ENCOUNTER — Ambulatory Visit (HOSPITAL_BASED_OUTPATIENT_CLINIC_OR_DEPARTMENT_OTHER)
Admission: RE | Admit: 2014-10-03 | Discharge: 2014-10-03 | Disposition: A | Discharge status: Home / Self Care | Payer: 59 | Source: Ambulatory Visit | Attending: Medical | Admitting: Medical

## 2014-10-03 ENCOUNTER — Ambulatory Visit (INDEPENDENT_AMBULATORY_CARE_PROVIDER_SITE_OTHER): Payer: 59 | Admitting: Medical

## 2014-10-03 ENCOUNTER — Encounter: Payer: Self-pay | Admitting: Medical

## 2014-10-03 VITALS — BP 129/88 | HR 76 | Temp 97.5°F | Ht 63.0 in | Wt 147.0 lb

## 2014-10-03 DIAGNOSIS — R059 Cough, unspecified: Secondary | ICD-10-CM

## 2014-10-03 DIAGNOSIS — J209 Acute bronchitis, unspecified: Secondary | ICD-10-CM | POA: Insufficient documentation

## 2014-10-03 DIAGNOSIS — R05 Cough: Secondary | ICD-10-CM | POA: Insufficient documentation

## 2014-10-03 DIAGNOSIS — H6502 Acute serous otitis media, left ear: Secondary | ICD-10-CM

## 2014-10-03 DIAGNOSIS — H6690 Otitis media, unspecified, unspecified ear: Secondary | ICD-10-CM | POA: Insufficient documentation

## 2014-10-03 MED ORDER — HYDROCODONE-HOMATROPINE 5-1.5 MG/5ML PO SYRP: 5.0000 mL | ORAL_SOLUTION | Freq: Three times a day (TID) | ORAL | Status: DC | PRN

## 2014-10-03 MED ORDER — ALBUTEROL SULFATE HFA 108 (90 BASE) MCG/ACT IN AERS: 2.0000 | INHALATION_SPRAY | Freq: Four times a day (QID) | RESPIRATORY_TRACT | Status: DC | PRN

## 2014-10-03 MED ORDER — AMOXICILLIN-POT CLAVULANATE 875-125 MG PO TABS: 1.0000 | ORAL_TABLET | Freq: Two times a day (BID) | ORAL | Status: DC

## 2014-10-03 NOTE — Assessment & Plan Note (Signed)
Prescription of Augmentin.

## 2014-10-03 NOTE — Progress Notes (Signed)
Pre visit review using our clinic review tool, if applicable. No additional management support is needed unless otherwise documented below in the visit note. 

## 2014-10-03 NOTE — Progress Notes (Signed)
Subjective:    Patient ID: Kaitlin Foster, female    DOB: 10-Dec-1980, 33 y.o.   MRN: [id number]  HPI   Pt in for 1 wk of cough. Day and night but worse at night. Non smoker. No hx of asthma. Cough is some productive. Pt speculate that she may wheeze. She is not sure. Pt has some very faint left flank /rib area discomfort which she feels like may represent lung congestion.  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 No fever, no chills or sweats. She is descrrbing some mild fatigue.  LMP- 2 weeks ago.  Mild lt ear pain couple of days.  Past Medical History  Diagnosis Date  . Hypertension   . AVM (arteriovenous malformation)     leaky  . Migraines     History   Social History  . Marital Status: Married    Spouse Name: N/A    Number of Children: N/A  . Years of Education: N/A   Occupational History  . Not on file.   Social History Main Topics  . Smoking status: Never Smoker   . Smokeless tobacco: Never Used  . Alcohol Use: No  . Drug Use: Not on file  . Sexual Activity: Yes    Birth Control/ Protection: Condom   Other Topics Concern  . Not on file   Social History Narrative   Regular exercise: 2 x weekly   Caffeine use:  1 coffee daily          Past Surgical History  Procedure Laterality Date  . Emobilization    . Craniotomy  2008    left frontal AVM    Family History  Problem Relation Age of Onset  . Hypertension Father   . Alcohol abuse Father   . Arthritis Father   . Hypertension Mother   . Arthritis Mother   . Cancer Neg Hx   . Heart disease Neg Hx     Allergies  Allergen Reactions  . Codeine     REACTION: upset stomach once, but has taken it other times and been ok  . Lisinopril   . Sulfonamide Derivatives     Current Outpatient Prescriptions on File Prior to Visit  Medication Sig Dispense Refill  . cetirizine (ZYRTEC) 10 MG tablet Take 10 mg by mouth daily as needed.     . cyclobenzaprine (FLEXERIL) 10 MG tablet Take 1  tablet (10 mg total) by mouth 3 (three) times daily as needed for muscle spasms. 30 tablet 0  . diltiazem (CARDIZEM CD) 120 MG 24 hr capsule TAKE 1 CAPSULE BY MOUTH DAILY 90 capsule 3  . etodolac (LODINE) 300 MG capsule Take 1 capsule (300 mg total) by mouth every 8 (eight) hours. 60 capsule 1  . fluticasone (FLONASE) 50 MCG/ACT nasal spray 2 sprays into each nostril daily 16 g 2  . meloxicam (MOBIC) 15 MG tablet Take one tab by mouth once daily with breakfast 10 tablet 1  . metaxalone (SKELAXIN) 800 MG tablet Take 1 tablet (800 mg total) by mouth 3 (three) times daily. 15 tablet 0   No current facility-administered medications on file prior to visit.    BP 129/88 mmHg  Pulse 76  Temp(Src) 97.5 F (36.4 C) (Oral)  Ht 5\' 3"  (1.6 m)  Wt 147 lb (66.679 kg)  BMI 26.05 kg/m2  SpO2 98%  LMP 09/19/2014      Review of Systems  Constitutional: Positive for fatigue. Negative for fever and chills.  HENT: Positive for ear pain. Negative for congestion, ear discharge, nosebleeds, postnasal drip, rhinorrhea, sinus pressure, sore throat and trouble swallowing.        Mild left ear pain.  Respiratory: Positive for cough and wheezing. Negative for chest tightness and shortness of breath.   Cardiovascular: Negative for chest pain and palpitations.  Gastrointestinal: Negative for nausea, vomiting, abdominal pain, diarrhea and constipation.  Genitourinary: Negative for dysuria and flank pain.  Musculoskeletal: Negative for back pain.        Left flank/left lower rib region discomfort which she feels like she is congestion possibly.  Neurological: Negative for dizziness, tremors, syncope, weakness and headaches.  Hematological: Negative for adenopathy. Does not bruise/bleed easily.  Psychiatric/Behavioral: Negative for suicidal ideas.       Objective:   Physical Exam  General  Mental Status - Alert. General Appearance - Well groomed. Not in acute distress.  Skin Rashes- No  Rashes.  HEENT Head- Normal. Ear Auditory Canal - Left- Normal. Right - Normal.Tympanic Membrane- Left- red.  Right- Normal. Eye Sclera/Conjunctiva- Left- Normal. Right- Normal. Nose & Sinuses Nasal Mucosa- Left- Not Boggy or Congested. Right- Not Boggy or Congested. Mouth & Throat Lips: Upper Lip- Normal: no dryness, cracking, pallor, cyanosis, or vesicular eruption. Lower Lip-Normal: no dryness, cracking, pallor, cyanosis or vesicular eruption. Buccal Mucosa- Bilateral- No Aphthous ulcers. Oropharynx- No Discharge or Erythema. Tonsils: Characteristics- Bilateral- No Erythema or Congestion. Size/Enlargement- Bilateral- No enlargement. Discharge- bilateral-None.  Neck Neck- Supple. No Masses.   Chest and Lung Exam Auscultation: Breath Sounds:-Normal. CTA  Cardiovascular Auscultation:Rythm- Regular. Rate and rhythm Murmurs & Other Heart Sounds:Ausculatation of the heart reveal- No Murmurs.  Lymphatic Head & Neck General Head & Neck Lymphatics: Bilateral: Description- No Localized lymphadenopathy.        Assessment & Plan:

## 2014-10-03 NOTE — Assessment & Plan Note (Addendum)
Augmentin antibiotic as well for bronchitis but will get a chest x-ray to rule out any walking pneumonia.  Prescription of Hydromet for the cough and if any wheezing starts then albuterol inhaler made available.

## 2014-10-03 NOTE — Patient Instructions (Addendum)
Your signs and symptoms that your describe do make me consider the possibility of walking pneumonia. So please get cxr today. Presently we'll treat for bronchitis.  You do appear to have lt om. I am prescribing augmentin.(this could help with pneumonia as well)  For your cough hyrdomet syrup.  For any wheezing albuterol inhaler.  Follow up in 7 days or as needed.

## 2014-10-24 ENCOUNTER — Telehealth: Payer: Self-pay | Admitting: Family

## 2014-10-24 DIAGNOSIS — J019 Acute sinusitis, unspecified: Secondary | ICD-10-CM

## 2014-10-24 MED ORDER — AZITHROMYCIN 250 MG PO TABS
ORAL_TABLET | ORAL | Status: DC
Start: 2014-10-24 — End: 2015-02-12

## 2014-10-24 MED ORDER — BENZONATATE 100 MG PO CAPS: 100.0000 mg | ORAL_CAPSULE | Freq: Three times a day (TID) | ORAL | Status: DC | PRN

## 2014-10-24 NOTE — Progress Notes (Signed)
We are sorry that you are not feeling well.  Here is how we plan to help!  Based on what you have shared with me it looks like you have sinusitis.  Sinusitis is inflammation and infection in the sinus cavities of the head.  Based on your presentation I believe you most likely have Acute Bacterial sinusitis.  This is an infection caused by bacteria and is treated with antibiotics.  I have prescribed Azithromyin 250 mg: two tables now and then one tablet daily for 4 additonal days   In addition you may use A prescription cough medication called Tessalon Perles 100mg . You may take 1-2 capsules every 8 hours as needed for your cough.   Sinus infections are not as easily transmitted as other respiratory infection, however we still recommend that you avoid close contact with loved ones, especially the very young and elderly.  Remember to wash your hands thoroughly throughout the day as this is the number one way to prevent the spread of infection!  Home Care:  Only take medications as instructed by your medical team.  Complete the entire course of an antibiotic.  Do not take these medications with alcohol.  A steam or ultrasonic humidifier can help congestion.  You can place a towel over your head and breathe in the steam from hot water coming from a faucet.  Avoid close contacts especially the very young and the elderly.  Cover your mouth when you cough or sneeze.  Always remember to wash your hands.  Get Help Right Away If:  You develop worsening fever or sinus pain.  You develop a severe head ache or visual changes.  Your symptoms persist after you have completed your treatment plan.  Make sure you  Understand these instructions.  Will watch your condition.  Will get help right away if you are not doing well or get worse.  Your e-visit answers were reviewed by a board certified advanced clinical practitioner to complete your personal care plan.  Depending on the condition, your  plan could have included both over the counter or prescription medications.  Please review your pharmacy choice.  If there is a problem, you may call our nursing hot line at [phone] and have the prescription routed to another pharmacy.  Your safety is important to [place].  If you have drug allergies check your prescription carefully.    You can use MyChart to ask questions about today's visit, request a non-urgent call back, or ask for a work or school excuse.  You will get an e-mail in the next two days asking about your experience.  I hope that your e-visit has been valuable and will speed your recovery. Thank you for using e-visits.

## 2014-12-10 ENCOUNTER — Encounter: Payer: Self-pay | Admitting: Family

## 2014-12-10 ENCOUNTER — Ambulatory Visit (INDEPENDENT_AMBULATORY_CARE_PROVIDER_SITE_OTHER): Payer: 59 | Admitting: Family

## 2014-12-10 VITALS — BP 118/78 | HR 76 | Temp 98.1°F | Resp 16 | Ht 63.0 in | Wt 145.5 lb

## 2014-12-10 DIAGNOSIS — G43009 Migraine without aura, not intractable, without status migrainosus: Secondary | ICD-10-CM

## 2014-12-10 NOTE — Assessment & Plan Note (Addendum)
Headache has improved from yesterday. Instructed to take Imitrex with Aleve and call if if this is not working. DIscussed  trying amitriptyline for prophylaxis if headaches continue at current frequency of once weekly.

## 2014-12-10 NOTE — Patient Instructions (Signed)
Try taking aleve 220mg  once with imitrex at start of migraine. Call if headache symptoms worsen or if not improved by aleve + imitrex.

## 2014-12-10 NOTE — Progress Notes (Signed)
Subjective:    Patient ID: Kaitlin Foster, female    DOB: May 30, 1981, 34 y.o.   MRN: [id number]  HPI Kaitlin Foster is here today for a migraine without aura that began yesterday at 2 pm. Pain has been about 8/10. Reports nausea and sensitivity to light.  She has tried imitrex in the past for migraine without relief. She has tried ibuprofen without relief. It's somewhat better today and is 2/10 right now. She reports migraines about once weekly. Excedrin migraine usually relieves pain. She reports sinus pressure most of the time and takes zyrtec daily. Takes flonase several times per week.  Had AMV in 2008 with craniotomy.   Review of Systems  HENT: Positive for sinus pressure.   Neurological:       Denies numbness/tingling in arms.   Past Medical History  Diagnosis Date  . Hypertension   . AVM (arteriovenous malformation)     leaky  . Migraines     History   Social History  . Marital Status: Married    Spouse Name: N/A    Number of Children: N/A  . Years of Education: N/A   Occupational History  . Not on file.   Social History Main Topics  . Smoking status: Never Smoker   . Smokeless tobacco: Never Used  . Alcohol Use: No  . Drug Use: Not on file  . Sexual Activity: Yes    Birth Control/ Protection: Condom   Other Topics Concern  . Not on file   Social History Narrative   Regular exercise: 2 x weekly   Caffeine use:  1 coffee daily          Past Surgical History  Procedure Laterality Date  . Emobilization    . Craniotomy  2008    left frontal AVM    Family History  Problem Relation Age of Onset  . Hypertension Father   . Alcohol abuse Father   . Arthritis Father   . Hypertension Mother   . Arthritis Mother   . Cancer Neg Hx   . Heart disease Neg Hx     Allergies  Allergen Reactions  . Codeine     REACTION: upset stomach once, but has taken it other times and been ok  . Lisinopril   . Sulfonamide Derivatives     Current Outpatient  Prescriptions on File Prior to Visit  Medication Sig Dispense Refill  . albuterol (PROVENTIL HFA;VENTOLIN HFA) 108 (90 BASE) MCG/ACT inhaler Inhale 2 puffs into the lungs every 6 (six) hours as needed for wheezing or shortness of breath. 1 Inhaler 0  . amoxicillin-clavulanate (AUGMENTIN) 875-125 MG per tablet Take 1 tablet by mouth 2 (two) times daily. (Patient not taking: Reported on 12/10/2014) 20 tablet 0  . azithromycin (ZITHROMAX) 250 MG tablet Take 500 mg once, then 250 mg for four days (Patient not taking: Reported on 12/10/2014) 6 tablet 0  . benzonatate (TESSALON) 100 MG capsule Take 1 capsule (100 mg total) by mouth 3 (three) times daily as needed for cough. (Patient not taking: Reported on 12/10/2014) 30 capsule 0  . cetirizine (ZYRTEC) 10 MG tablet Take 10 mg by mouth daily as needed.     . cyclobenzaprine (FLEXERIL) 10 MG tablet Take 1 tablet (10 mg total) by mouth 3 (three) times daily as needed for muscle spasms. (Patient not taking: Reported on 12/10/2014) 30 tablet 0  . diltiazem (CARDIZEM CD) 120 MG 24 hr capsule TAKE 1 CAPSULE BY MOUTH DAILY 90 capsule 3  .  etodolac (LODINE) 300 MG capsule Take 1 capsule (300 mg total) by mouth every 8 (eight) hours. 60 capsule 1  . fluticasone (FLONASE) 50 MCG/ACT nasal spray 2 sprays into each nostril daily 16 g 2  . HYDROcodone-homatropine (HYCODAN) 5-1.5 MG/5ML syrup Take 5 mLs by mouth every 8 (eight) hours as needed for cough. (Patient not taking: Reported on 12/10/2014) 120 mL 0  . meloxicam (MOBIC) 15 MG tablet Take one tab by mouth once daily with breakfast (Patient not taking: Reported on 12/10/2014) 10 tablet 1  . metaxalone (SKELAXIN) 800 MG tablet Take 1 tablet (800 mg total) by mouth 3 (three) times daily. 15 tablet 0   No current facility-administered medications on file prior to visit.    BP 118/78 mmHg  Pulse 76  Temp(Src) 98.1 F (36.7 C) (Oral)  Resp 16  Ht 5\' 3"  (1.6 m)  Wt 145 lb 8 oz (65.998 kg)  BMI 25.78 kg/m2  SpO2 98%  LMP  11/22/2014       Objective:   Physical Exam  Constitutional: She is oriented to person, place, and time. She appears well-developed and well-nourished. No distress.  Eyes: Pupils are equal, round, and reactive to light.  Cardiovascular: Normal rate, regular rhythm and normal heart sounds.  Exam reveals no gallop and no friction rub.   No murmur heard. Pulmonary/Chest: Effort normal and breath sounds normal. No respiratory distress. She has no wheezes. She has no rales.  Neurological: She is alert and oriented to person, place, and time.  Cranial nerves II-XII grossly intact.  Skin: She is not diaphoretic.          Assessment & Plan:   Patient seen along with Dawn Whitmire NP-student.  I have personally seen and examined patient and agree with Ms. Whitmire's assessment and plan- we did discuss possibility of adding elavil HS for migraine prevention. She wishes to try using imitrex + aleve prn migraine and if this does not help, then she may consider adding elavil at a later date. [name]

## 2014-12-10 NOTE — Progress Notes (Signed)
Pre visit review using our clinic review tool, if applicable. No additional management support is needed unless otherwise documented below in the visit note. 

## 2014-12-11 ENCOUNTER — Telehealth: Payer: Self-pay | Admitting: Family

## 2014-12-11 NOTE — Telephone Encounter (Signed)
See mychart.  

## 2014-12-13 ENCOUNTER — Telehealth: Payer: Self-pay | Admitting: Family

## 2014-12-13 NOTE — Telephone Encounter (Signed)
Caller name: Shatyra Relation to pt: self Call back number: [phone] Pharmacy: [facility]  Reason for call:   Patient needs refill of imitrex. She thought she had some but the rx has expired.

## 2014-12-13 NOTE — Telephone Encounter (Signed)
Last filled: 11/18/12 Amt: 10, 0 refills Last OV:  02/19/14  Please advise.

## 2014-12-14 MED ORDER — SUMATRIPTAN SUCCINATE 50 MG PO TABS: ORAL_TABLET | ORAL | Status: DC

## 2015-02-12 ENCOUNTER — Encounter: Payer: Self-pay | Admitting: Family

## 2015-02-12 ENCOUNTER — Ambulatory Visit (INDEPENDENT_AMBULATORY_CARE_PROVIDER_SITE_OTHER): Payer: 59 | Admitting: Family

## 2015-02-12 ENCOUNTER — Ambulatory Visit: Payer: 59 | Admitting: Physician Assistant

## 2015-02-12 VITALS — BP 110/80 | HR 74 | Temp 97.6°F | Resp 16 | Ht 63.0 in | Wt 141.6 lb

## 2015-02-12 DIAGNOSIS — S39012A Strain of muscle, fascia and tendon of lower back, initial encounter: Secondary | ICD-10-CM

## 2015-02-12 DIAGNOSIS — F40243 Fear of flying: Secondary | ICD-10-CM

## 2015-02-12 MED ORDER — CYCLOBENZAPRINE HCL 5 MG PO TABS: 5.0000 mg | ORAL_TABLET | Freq: Three times a day (TID) | ORAL | Status: DC | PRN

## 2015-02-12 MED ORDER — ALPRAZOLAM 0.5 MG PO TABS: 0.5000 mg | ORAL_TABLET | Freq: Every day | ORAL | Status: DC | PRN

## 2015-02-12 MED ORDER — METHYLPREDNISOLONE 4 MG PO KIT: PACK | ORAL | Status: DC

## 2015-02-12 NOTE — Patient Instructions (Signed)
Start medrol dose pak and flexeril PRN (may cause drowsiness so don't take prior to driving) You may use a dose of xanax prior to each flight. Call if back pain symptoms worsen or if not improved in 2 weeks.

## 2015-02-12 NOTE — Assessment & Plan Note (Signed)
No improvement with nsaids, trial of medrol dose pak and prn flexeril, plan PT referral if no improvement.

## 2015-02-12 NOTE — Progress Notes (Signed)
Pre visit review using our clinic review tool, if applicable. No additional management support is needed unless otherwise documented below in the visit note. 

## 2015-02-12 NOTE — Progress Notes (Signed)
Subjective:     Patient ID: Kaitlin Foster, female   DOB: [birth year], 34 y.o.   MRN: [id number]  HPI  [name] is a 34 yr old female who presents with complaint of back pain. Started 2 weeks ago, right lower back, worse with prolonged sitting.  Feels like the muscles sore down the right leg.  Denies numbness. Had some baclofen, lodine, ibuprofen.   She is flying Friday and is requesting med for flight anxiety.   Review of Systems See HPI  Past Medical History  Diagnosis Date  . Hypertension   . AVM (arteriovenous malformation)     leaky  . Migraines     History   Social History  . Marital Status: Married    Spouse Name: N/A  . Number of Children: N/A  . Years of Education: N/A   Occupational History  . Not on file.   Social History Main Topics  . Smoking status: Never Smoker   . Smokeless tobacco: Never Used  . Alcohol Use: No  . Drug Use: Not on file  . Sexual Activity: Yes    Birth Control/ Protection: Condom   Other Topics Concern  . Not on file   Social History Narrative   Regular exercise: 2 x weekly   Caffeine use:  1 coffee daily          Past Surgical History  Procedure Laterality Date  . Emobilization    . Craniotomy  2008    left frontal AVM    Family History  Problem Relation Age of Onset  . Hypertension Father   . Alcohol abuse Father   . Arthritis Father   . Hypertension Mother   . Arthritis Mother   . Cancer Neg Hx   . Heart disease Neg Hx     Allergies  Allergen Reactions  . Codeine     REACTION: upset stomach once, but has taken it other times and been ok  . Lisinopril     cough  . Sulfonamide Derivatives Hives    Current Outpatient Prescriptions on File Prior to Visit  Medication Sig Dispense Refill  . cetirizine (ZYRTEC) 10 MG tablet Take 10 mg by mouth daily as needed.     . diltiazem (CARDIZEM CD) 120 MG 24 hr capsule TAKE 1 CAPSULE BY MOUTH DAILY 90 capsule 3  . etodolac (LODINE) 300 MG capsule Take 1 capsule (300  mg total) by mouth every 8 (eight) hours. (Patient taking differently: Take 300 mg by mouth every 8 (eight) hours as needed. ) 60 capsule 1  . fluticasone (FLONASE) 50 MCG/ACT nasal spray 2 sprays into each nostril daily 16 g 2  . SUMAtriptan (IMITREX) 50 MG tablet One tab at start of headache, may repeat in 2 hours if needed. Max 2 tabs in 24 hours. 10 tablet 5   No current facility-administered medications on file prior to visit.    BP 110/80 mmHg  Pulse 74  Temp(Src) 97.6 F (36.4 C) (Oral)  Resp 16  Ht 5\' 3"  (1.6 m)  Wt 141 lb 9.6 oz (64.229 kg)  BMI 25.09 kg/m2  SpO2 99%  LMP 01/29/2015       Objective:   Physical Exam  Constitutional: She appears well-developed and well-nourished.  Cardiovascular: Normal rate, regular rhythm and normal heart sounds.   No murmur heard. Pulmonary/Chest: Effort normal and breath sounds normal. No respiratory distress. She has no wheezes.  Musculoskeletal:       Cervical back: She exhibits no tenderness.  Thoracic back: She exhibits no tenderness.       Lumbar back: She exhibits no tenderness.  Bilateral LE strength is 5/5  Neurological:  Reflex Scores:      Patellar reflexes are 2+ on the right side and 2+ on the left side. Psychiatric: She has a normal mood and affect. Her behavior is normal. Judgment and thought content normal.       Assessment:        Plan:

## 2015-02-12 NOTE — Assessment & Plan Note (Signed)
Rx provided for xanax for her upcoming trip.

## 2015-02-14 ENCOUNTER — Encounter: Payer: Self-pay | Admitting: Family

## 2015-03-25 ENCOUNTER — Other Ambulatory Visit: Payer: Self-pay | Admitting: Family

## 2015-06-24 ENCOUNTER — Encounter: Payer: Self-pay | Admitting: *Deleted

## 2015-06-24 ENCOUNTER — Telehealth: Payer: Self-pay | Admitting: *Deleted

## 2015-06-24 NOTE — Telephone Encounter (Signed)
Pre-Visit Call completed with patient and chart updated.   Pre-Visit Info documented in Specialty Comments under [employer].    

## 2015-06-25 ENCOUNTER — Encounter: Payer: Self-pay | Admitting: Family

## 2015-06-25 ENCOUNTER — Ambulatory Visit (INDEPENDENT_AMBULATORY_CARE_PROVIDER_SITE_OTHER): Payer: 59 | Admitting: Family

## 2015-06-25 VITALS — BP 100/78 | HR 86 | Temp 98.1°F | Resp 16 | Ht 64.0 in | Wt 143.2 lb

## 2015-06-25 DIAGNOSIS — R21 Rash and other nonspecific skin eruption: Secondary | ICD-10-CM | POA: Insufficient documentation

## 2015-06-25 DIAGNOSIS — J309 Allergic rhinitis, unspecified: Secondary | ICD-10-CM | POA: Diagnosis not present

## 2015-06-25 DIAGNOSIS — Z0000 Encounter for general adult medical examination without abnormal findings: Secondary | ICD-10-CM

## 2015-06-25 LAB — URINALYSIS, ROUTINE W REFLEX MICROSCOPIC
BILIRUBIN URINE: NEGATIVE
Hgb urine dipstick: NEGATIVE
KETONES UR: NEGATIVE
LEUKOCYTES UA: NEGATIVE
Nitrite: NEGATIVE
PH: 6.5 (ref 5.0–8.0)
RBC / HPF: NONE SEEN (ref 0–?)
Specific Gravity, Urine: 1.01 (ref 1.000–1.030)
TOTAL PROTEIN, URINE-UPE24: NEGATIVE
UROBILINOGEN UA: 0.2 (ref 0.0–1.0)
Urine Glucose: NEGATIVE
WBC, UA: NONE SEEN (ref 0–?)

## 2015-06-25 LAB — CBC WITH DIFFERENTIAL/PLATELET
BASOS PCT: 0.4 % (ref 0.0–3.0)
Basophils Absolute: 0 10*3/uL (ref 0.0–0.1)
EOS ABS: 0.1 10*3/uL (ref 0.0–0.7)
Eosinophils Relative: 1.9 % (ref 0.0–5.0)
HEMATOCRIT: 42.3 % (ref 36.0–46.0)
Hemoglobin: 14.3 g/dL (ref 12.0–15.0)
LYMPHS PCT: 31.8 % (ref 12.0–46.0)
Lymphs Abs: 2 10*3/uL (ref 0.7–4.0)
MCHC: 33.7 g/dL (ref 30.0–36.0)
MCV: 87.2 fl (ref 78.0–100.0)
MONOS PCT: 9.8 % (ref 3.0–12.0)
Monocytes Absolute: 0.6 10*3/uL (ref 0.1–1.0)
NEUTROS ABS: 3.6 10*3/uL (ref 1.4–7.7)
Neutrophils Relative %: 56.1 % (ref 43.0–77.0)
PLATELETS: 230 10*3/uL (ref 150.0–400.0)
RBC: 4.85 Mil/uL (ref 3.87–5.11)
RDW: 12.5 % (ref 11.5–15.5)
WBC: 6.4 10*3/uL (ref 4.0–10.5)

## 2015-06-25 LAB — HEPATIC FUNCTION PANEL
ALBUMIN: 4.3 g/dL (ref 3.5–5.2)
ALT: 14 U/L (ref 0–35)
AST: 20 U/L (ref 0–37)
Alkaline Phosphatase: 64 U/L (ref 39–117)
Bilirubin, Direct: 0.1 mg/dL (ref 0.0–0.3)
TOTAL PROTEIN: 7.1 g/dL (ref 6.0–8.3)
Total Bilirubin: 0.5 mg/dL (ref 0.2–1.2)

## 2015-06-25 LAB — TSH: TSH: 2.39 u[IU]/mL (ref 0.35–4.50)

## 2015-06-25 LAB — BASIC METABOLIC PANEL
BUN: 19 mg/dL (ref 6–23)
CHLORIDE: 104 meq/L (ref 96–112)
CO2: 29 meq/L (ref 19–32)
CREATININE: 0.82 mg/dL (ref 0.40–1.20)
Calcium: 9.5 mg/dL (ref 8.4–10.5)
GFR: 85.5 mL/min (ref >60.00)
Glucose, Bld: 92 mg/dL (ref 70–99)
Potassium: 3.7 mEq/L (ref 3.5–5.1)
Sodium: 138 mEq/L (ref 135–145)

## 2015-06-25 LAB — LIPID PANEL
CHOL/HDL RATIO: 4
Cholesterol: 147 mg/dL (ref 0–200)
HDL: 33.8 mg/dL — ABNORMAL LOW (ref >39.00)
LDL CALC: 90 mg/dL (ref 0–99)
NonHDL: 113.26
Triglycerides: 116 mg/dL (ref 0.0–149.0)
VLDL: 23.2 mg/dL (ref 0.0–40.0)

## 2015-06-25 MED ORDER — CLOTRIMAZOLE 1 % EX CREA: 1.0000 "application " | TOPICAL_CREAM | Freq: Two times a day (BID) | CUTANEOUS | Status: DC

## 2015-06-25 MED ORDER — MONTELUKAST SODIUM 10 MG PO TABS: 10.0000 mg | ORAL_TABLET | Freq: Every day | ORAL | Status: DC

## 2015-06-25 NOTE — Patient Instructions (Signed)
Please complete lab work prior to leaving. Start singulair once daily for nasal congestion. Apply otc lotrimin between breasts to rash. Call if rash worsens or does not improve. Try to add 30 minutes of exercise 5 days a week.

## 2015-06-25 NOTE — Assessment & Plan Note (Addendum)
Appears fungal. Advised trial of lotrimin.

## 2015-06-25 NOTE — Assessment & Plan Note (Signed)
Uncontrolled. Continue zyrtec and flonase, add trial of singulair.

## 2015-06-25 NOTE — Assessment & Plan Note (Signed)
Discussed adding more exercise. Immunizations reviewed and up to date.  Continue healthy diet. Obtain routine labs.

## 2015-06-25 NOTE — Progress Notes (Signed)
Pre visit review using our clinic review tool, if applicable. No additional management support is needed unless otherwise documented below in the visit note. 

## 2015-06-25 NOTE — Progress Notes (Signed)
Subjective:    Patient ID: Kaitlin Foster, female    DOB: 10-10-1981, 34 y.o.   MRN: [id number]  HPI  Kaitlin Foster is a 34 yr old female who presents today for cpx.  Immunizations: tetanus up to date Diet: diet is healthy Exercise:not exercising regularly Pap Smear: 2013 Dental:  Up to date Vision- 1 year ago  Allergic rhinitis- reports taking zyrtec and flonase which help but report she always feels "like I have something in the back of my throat.   Review of Systems  Constitutional: Negative for unexpected weight change.  HENT: Negative for hearing loss and rhinorrhea.   Eyes: Negative for visual disturbance.  Respiratory: Negative for cough.   Cardiovascular: Negative for leg swelling.  Gastrointestinal: Negative for nausea, diarrhea and constipation.  Genitourinary: Negative for dysuria, frequency and menstrual problem.  Musculoskeletal: Positive for back pain.       Some knee/hip pain- mild  Skin: Negative for rash.  Neurological:       + frequent headaches- attributes to post surgical HA's, imitrex helps if she takes early  Hematological: Negative for adenopathy.  Psychiatric/Behavioral: Negative for dysphoric mood and agitation.   Past Medical History  Diagnosis Date  . Hypertension   . AVM (arteriovenous malformation)     leaky  . Migraines     Social History   Social History  . Marital Status: Married    Spouse Name: N/A  . Number of Children: N/A  . Years of Education: N/A   Occupational History  . Not on file.   Social History Main Topics  . Smoking status: Never Smoker   . Smokeless tobacco: Never Used  . Alcohol Use: No  . Drug Use: No  . Sexual Activity: Yes    Birth Control/ Protection: Condom   Other Topics Concern  . Not on file   Social History Narrative   Regular exercise: 2 x weekly   Caffeine use:  1 coffee daily          Past Surgical History  Procedure Laterality Date  . Emobilization    . Craniotomy  2008    left  frontal AVM    Family History  Problem Relation Age of Onset  . Hypertension Father   . Alcohol abuse Father   . Arthritis Father   . Hypertension Mother   . Arthritis Mother   . Cancer Neg Hx   . Heart disease Neg Hx     Allergies  Allergen Reactions  . Codeine     REACTION: upset stomach once, but has taken it other times and been ok  . Lisinopril     cough  . Sulfonamide Derivatives Hives    Current Outpatient Prescriptions on File Prior to Visit  Medication Sig Dispense Refill  . CARTIA XT 120 MG 24 hr capsule TAKE 1 CAPSULE BY MOUTH DAILY 90 capsule 1  . cetirizine (ZYRTEC) 10 MG tablet Take 10 mg by mouth daily as needed.     . cyclobenzaprine (FLEXERIL) 5 MG tablet Take 1 tablet (5 mg total) by mouth 3 (three) times daily as needed for muscle spasms. 20 tablet 0  . etodolac (LODINE) 300 MG capsule Take 1 capsule (300 mg total) by mouth every 8 (eight) hours. (Patient taking differently: Take 300 mg by mouth every 8 (eight) hours as needed. ) 60 capsule 1  . fluticasone (FLONASE) 50 MCG/ACT nasal spray 2 sprays into each nostril daily 16 g 2  . SUMAtriptan (IMITREX) 50 MG  tablet One tab at start of headache, may repeat in 2 hours if needed. Max 2 tabs in 24 hours. 10 tablet 5  . Multiple Vitamins-Minerals (MULTIVITAMIN WITH MINERALS) tablet Take 1 tablet by mouth daily.     No current facility-administered medications on file prior to visit.    BP 100/78 mmHg  Pulse 86  Temp(Src) 98.1 F (36.7 C) (Oral)  Resp 16  Ht 5\' 4"  (1.626 m)  Wt 143 lb 3.2 oz (64.955 kg)  BMI 24.57 kg/m2  SpO2 96%  LMP 06/03/2015        Objective:   Physical Exam  Physical Exam  Constitutional: She is oriented to person, place, and time. She appears well-developed and well-nourished. No distress.  HENT:  Head: Normocephalic and atraumatic.  Right Ear: Tympanic membrane and ear canal normal.  Left Ear: Tympanic membrane and ear canal normal.  Mouth/Throat: Oropharynx is clear  and moist.  Eyes: Pupils are equal, round, and reactive to light. No scleral icterus.  Neck: Normal range of motion. No thyromegaly present.  Cardiovascular: Normal rate and regular rhythm.   No murmur heard. Pulmonary/Chest: Effort normal and breath sounds normal. No respiratory distress. He has no wheezes. She has no rales. She exhibits no tenderness.  Abdominal: Soft. Bowel sounds are normal. He exhibits no distension and no mass. There is no tenderness. There is no rebound and no guarding.  Musculoskeletal: She exhibits no edema.  Lymphadenopathy:    She has no cervical adenopathy.  Neurological: She is alert and oriented to person, place, and time. She has normal patellar reflexes. She exhibits normal muscle tone. Coordination normal.  Skin: Skin is warm and dry. annular rash noted between breasts Psychiatric: She has a normal mood and affect. Her behavior is normal. Judgment and thought content normal.  Breasts: Examined lying Right: Without masses, retractions, discharge or axillary adenopathy.  Left: Without masses, retractions, discharge or axillary adenopathy.        Assessment & Plan:        Assessment & Plan:

## 2015-07-11 ENCOUNTER — Encounter: Payer: Self-pay | Admitting: Neurology

## 2015-07-11 ENCOUNTER — Ambulatory Visit (INDEPENDENT_AMBULATORY_CARE_PROVIDER_SITE_OTHER): Payer: 59 | Admitting: Neurology

## 2015-07-11 VITALS — BP 127/87 | HR 83 | Ht 63.0 in | Wt 144.4 lb

## 2015-07-11 DIAGNOSIS — J930 Spontaneous tension pneumothorax: Secondary | ICD-10-CM | POA: Insufficient documentation

## 2015-07-11 DIAGNOSIS — G43009 Migraine without aura, not intractable, without status migrainosus: Secondary | ICD-10-CM

## 2015-07-11 DIAGNOSIS — Q2730 Arteriovenous malformation, site unspecified: Secondary | ICD-10-CM

## 2015-07-11 MED ORDER — TOPIRAMATE 25 MG PO TABS: 25.0000 mg | ORAL_TABLET | Freq: Two times a day (BID) | ORAL | Status: DC

## 2015-07-11 MED ORDER — ELETRIPTAN HYDROBROMIDE 20 MG PO TABS
20.0000 mg | ORAL_TABLET | ORAL | Status: DC | PRN
Start: 2015-07-11 — End: 2015-11-01

## 2015-07-11 NOTE — Patient Instructions (Signed)
I had a long discussion with the patient with regards to her left frontal headaches which seem to have mixed migraine and tension headache features. She has a remote history of left frontal AVM which has been surgically and endovascular leak treated. I recommend a trial of Relpax 20 mg daily for symptomatic relief may repeat a second dose after one hour but not more than 2 doses per day and not more than 2 days per week. Also start Topamax 25 mg twice daily for headache prophylaxis. I discussed possible side effects with her and advised to call me if needed. I have also advised her to do regular neck stretching exercises as well as increased but his patient in activities for stress relaxation-like exercise, swimming, medication and yoga. Check CT angiogram of the brain to follow her brain AVM. She will keep a strict headache diary and return for follow-up in 2 months or call earlier if necessary. Migraine Headache A migraine headache is an intense, throbbing pain on one or both sides of your head. A migraine can last for 30 minutes to several hours. CAUSES  The exact cause of a migraine headache is not always known. However, a migraine may be caused when nerves in the brain become irritated and release chemicals that cause inflammation. This causes pain. Certain things may also trigger migraines, such as:  Alcohol.  Smoking.  Stress.  Menstruation.  Aged cheeses.  Foods or drinks that contain nitrates, glutamate, aspartame, or tyramine.  Lack of sleep.  Chocolate.  Caffeine.  Hunger.  Physical exertion.  Fatigue.  Medicines used to treat chest pain (nitroglycerine), birth control pills, estrogen, and some blood pressure medicines. SIGNS AND SYMPTOMS  Pain on one or both sides of your head.  Pulsating or throbbing pain.  Severe pain that prevents daily activities.  Pain that is aggravated by any physical activity.  Nausea, vomiting, or both.  Dizziness.  Pain with  exposure to bright lights, loud noises, or activity.  General sensitivity to bright lights, loud noises, or smells. Before you get a migraine, you may get warning signs that a migraine is coming (aura). An aura may include:  Seeing flashing lights.  Seeing bright spots, halos, or zigzag lines.  Having tunnel vision or blurred vision.  Having feelings of numbness or tingling.  Having trouble talking.  Having muscle weakness. DIAGNOSIS  A migraine headache is often diagnosed based on:  Symptoms.  Physical exam.  A CT scan or MRI of your head. These imaging tests cannot diagnose migraines, but they can help rule out other causes of headaches. TREATMENT Medicines may be given for pain and nausea. Medicines can also be given to help prevent recurrent migraines.  HOME CARE INSTRUCTIONS  Only take over-the-counter or prescription medicines for pain or discomfort as directed by your health care provider. The use of long-term narcotics is not recommended.  Lie down in a dark, quiet room when you have a migraine.  Keep a journal to find out what may trigger your migraine headaches. For example, write down:  What you eat and drink.  How much sleep you get.  Any change to your diet or medicines.  Limit alcohol consumption.  Quit smoking if you smoke.  Get 7-9 hours of sleep, or as recommended by your health care provider.  Limit stress.  Keep lights dim if bright lights bother you and make your migraines worse. SEEK IMMEDIATE MEDICAL CARE IF:   Your migraine becomes severe.  You have a fever.  You  have a stiff neck.  You have vision loss.  You have muscular weakness or loss of muscle control.  You start losing your balance or have trouble walking.  You feel faint or pass out.  You have severe symptoms that are different from your first symptoms. MAKE SURE YOU:   Understand these instructions.  Will watch your condition.  Will get help right away if you are  not doing well or get worse. Document Released: 10/26/2005 Document Revised: 03/12/2014 Document Reviewed: 07/03/2013 [facility] Patient Information 2015 [place], [place]. This information is not intended to replace advice given to you by your health care provider. Make sure you discuss any questions you have with your health care provider.

## 2015-07-11 NOTE — Progress Notes (Signed)
[facility] [address] [place]. [place] [address] [phone]       OFFICE CONSULT NOTE  [name] Date of Birth:  [birth year] Medical Record Number:  [id number]   Referring MD:  Self  Reason for Referral:  Headaches  HPI: [name] is a 39 year pleasant Caucasian lady who has had chronic headaches since teenage years. 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 She underwent initial embolization 2 followed by craniotomy and excision of the AVM by Dr. Kalman Shan at [facility]. I reviewed her records in Care everywhere. She states the headaches change character after this. She has been having left frontal and temporal headaches once a week or so. She describes this as moderate in intensity 8/10 throbbing at times and pressure-like on other occasions. These last for several hours. Most of the time she doesn't take medications but when it becomes intolerable she takes migraine Excedrin which works fairly well. She has not been to urgent care or emergency room with these headaches. There are no apparent triggers for the headaches but they seem to be made worse by allergies, dehydration, stress and lack of sleep. She does get nausea and has light sensitivity and feels like lying down with the headaches. She has occasional visual spots with her headaches but no focal neurological symptoms. She does have some residual numbness on the left side of the face and mild facial weakness as a result of the surgery. She had follow-up catheter angiogram done in 2009 which showed that the AVM had not recurred but since then she's had no brain imaging studies or vascular imaging studies done. She does complain of tightness and pain in the back of her neck and muscle pulling sensation as well. She does take diltiazem for hypertension. She had tried propranolol for migraine  prophylaxis in the past but it did not help her. She denies drinking excessive caffeine.  ROS:   14 system review of systems is positive for headache, joint pain, allergies and all other systems negative PMH:  Past Medical History  Diagnosis Date  . Hypertension   . AVM (arteriovenous malformation)     leaky  . Migraines     Social History:  Social History   Social History  . Marital Status: Married    Spouse Name: N/A  . Number of Children: N/A  . Years of Education: N/A   Occupational History  . Not on file.   Social History Main Topics  . Smoking status: Never Smoker   . Smokeless tobacco: Never Used  . Alcohol Use: No  . Drug Use: No  . Sexual Activity: Yes    Birth Control/ Protection: Condom   Other Topics Concern  . Not on file   Social History Narrative   Regular exercise: 2 x weekly   Caffeine use:  1 coffee daily          Medications:   Current Outpatient Prescriptions on File Prior to Visit  Medication Sig Dispense Refill  . CARTIA XT 120 MG 24 hr capsule TAKE 1 CAPSULE BY MOUTH DAILY 90 capsule 1  . cetirizine (ZYRTEC) 10 MG tablet Take 10 mg by mouth daily as needed.     . clotrimazole (LOTRIMIN AF) 1 % cream Apply 1 application topically 2 (two) times daily. 30 g 0  . cyclobenzaprine (FLEXERIL) 5 MG tablet Take 1  tablet (5 mg total) by mouth 3 (three) times daily as needed for muscle spasms. 20 tablet 0  . etodolac (LODINE) 300 MG capsule Take 1 capsule (300 mg total) by mouth every 8 (eight) hours. (Patient taking differently: Take 300 mg by mouth every 8 (eight) hours as needed. ) 60 capsule 1  . fluticasone (FLONASE) 50 MCG/ACT nasal spray 2 sprays into each nostril daily 16 g 2  . montelukast (SINGULAIR) 10 MG tablet Take 1 tablet (10 mg total) by mouth at bedtime. 30 tablet 5   No current facility-administered medications on file prior to visit.    Allergies:   Allergies  Allergen Reactions  . Codeine     REACTION: upset stomach once,  but has taken it other times and been ok  . Lisinopril     cough  . Sulfonamide Derivatives Hives    Physical Exam General: well developed, well nourished, seated, in no evident distress Head: head normocephalic and atraumatic.   Neck: supple with no carotid or supraclavicular bruits Cardiovascular: regular rate and rhythm, no murmurs Musculoskeletal: Mild left frontal and temporal bony deformity at surgical site Skin:  no rash/petichiae Vascular:  Normal pulses all extremities  Neurologic Exam Mental Status: Awake and fully alert. Oriented to place and time. Recent and remote memory intact. Attention span, concentration and fund of knowledge appropriate. Mood and affect appropriate.  Cranial Nerves: Fundoscopic exam reveals sharp disc margins. Pupils equal, briskly reactive to light. Extraocular movements full without nystagmus. Visual fields full to confrontation. Hearing intact. Mild left lower facial asymmetry. Facial sensation intact. Face, tongue, palate moves normally and symmetrically.  Motor: Normal bulk and tone. Normal strength in all tested extremity muscles. Sensory.: intact to touch , pinprick , position and vibratory sensation.  Coordination: Rapid alternating movements normal in all extremities. Finger-to-nose and heel-to-shin performed accurately bilaterally. Gait and Station: Arises from chair without difficulty. Stance is normal. Gait demonstrates normal stride length and balance . Able to heel, toe and tandem walk without difficulty.  Reflexes: 1+ and symmetric. Toes downgoing.       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 She has a remote history of left frontal AVM  which has been surgically and endovascular leak treated. I recommend a trial of Relpax 20 mg daily for symptomatic relief may repeat a second dose after one hour but not more than 2 doses per day and not more than 2 days per week. Also start Topamax 25 mg twice daily for headache prophylaxis. I discussed possible side effects with her and advised to call me if needed. I have also advised her to do regular neck stretching exercises as well as increased but his patient in activities for stress relaxation-like exercise, swimming, medication and yoga. Check CT angiogram of the brain to follow her brain AVM. She will keep a strict headache diary and return for follow-up in 2 months or call earlier if necessary. Antony Contras, MD Note: This document was prepared with digital dictation and possible smart phrase technology. Any transcriptional errors that result from this process are unintentional.

## 2015-07-17 ENCOUNTER — Telehealth: Payer: Self-pay | Admitting: Neurology

## 2015-07-17 NOTE — Telephone Encounter (Signed)
Patient is calling because she is scheduled for a CT scan on 07-19-15 at [facility]. She had requested that it be done at a [place] facility because it would be much cheaper. Please call and discuss. Thank you.

## 2015-07-17 NOTE — Telephone Encounter (Signed)
Per pt's request, CT order sent to [facility]. Pt is scheduled for 9/12 @ 2:30 pm to arrive @ 2:15 pm.  Patient is aware of appointment change. Thanks!

## 2015-07-19 ENCOUNTER — Inpatient Hospital Stay: Admission: RE | Admit: 2015-07-19 | Payer: 59 | Source: Ambulatory Visit

## 2015-07-22 ENCOUNTER — Ambulatory Visit (HOSPITAL_COMMUNITY)
Admission: RE | Admit: 2015-07-22 | Discharge: 2015-07-22 | Disposition: A | Discharge status: Home / Self Care | Payer: 59 | Source: Ambulatory Visit | Attending: Neurology | Admitting: Neurology

## 2015-07-22 ENCOUNTER — Encounter (HOSPITAL_COMMUNITY): Payer: Self-pay

## 2015-07-22 DIAGNOSIS — G43909 Migraine, unspecified, not intractable, without status migrainosus: Secondary | ICD-10-CM | POA: Diagnosis not present

## 2015-07-22 DIAGNOSIS — Q282 Arteriovenous malformation of cerebral vessels: Secondary | ICD-10-CM | POA: Diagnosis not present

## 2015-07-22 DIAGNOSIS — Q2730 Arteriovenous malformation, site unspecified: Secondary | ICD-10-CM

## 2015-07-22 MED ORDER — IOHEXOL 350 MG/ML SOLN
50.0000 mL | Freq: Once | INTRAVENOUS | Status: AC | PRN
  Administered 2015-07-22: 50 mL via INTRAVENOUS

## 2015-07-30 ENCOUNTER — Ambulatory Visit (INDEPENDENT_AMBULATORY_CARE_PROVIDER_SITE_OTHER): Payer: 59 | Admitting: Physician Assistant

## 2015-07-30 ENCOUNTER — Encounter: Payer: Self-pay | Admitting: Physician Assistant

## 2015-07-30 VITALS — BP 118/88 | HR 74 | Temp 98.1°F | Resp 16 | Ht 63.0 in | Wt 144.0 lb

## 2015-07-30 DIAGNOSIS — B084 Enteroviral vesicular stomatitis with exanthem: Secondary | ICD-10-CM

## 2015-07-30 NOTE — Progress Notes (Signed)
Patient presents to clinic today c/o pruritic rash of bilateral feet and left hand 2 days. Patient states she first noticed red bumps on the bottom of her feet. States they have now multiplied and she has noted similar symptoms on her hands. Endorses itching but denies pain. Denies fever, chills or respiratory symptoms. Denies recent travel. 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  Past Medical History  Diagnosis Date  . Hypertension   . AVM (arteriovenous malformation)     leaky  . Migraines     Current Outpatient Prescriptions on File Prior to Visit  Medication Sig Dispense Refill  . CARTIA XT 120 MG 24 hr capsule TAKE 1 CAPSULE BY MOUTH DAILY 90 capsule 1  . cetirizine (ZYRTEC) 10 MG tablet Take 10 mg by mouth daily as needed.     . clotrimazole (LOTRIMIN AF) 1 % cream Apply 1 application topically 2 (two) times daily. 30 g 0  . cyclobenzaprine (FLEXERIL) 5 MG tablet Take 1 tablet (5 mg total) by mouth 3 (three) times daily as needed for muscle spasms. 20 tablet 0  . eletriptan (RELPAX) 20 MG tablet Take 1 tablet (20 mg total) by mouth as needed for migraine or headache. May repeat in 2 hours if headache persists or recurs. 10 tablet 0  . etodolac (LODINE) 300 MG capsule Take 1 capsule (300 mg total) by mouth every 8 (eight) hours. (Patient taking differently: Take 300 mg by mouth every 8 (eight) hours as needed. ) 60 capsule 1  . fluticasone (FLONASE) 50 MCG/ACT nasal spray 2 sprays into each nostril daily 16 g 2  . montelukast (SINGULAIR) 10 MG tablet Take 1 tablet (10 mg total) by mouth at bedtime. 30 tablet 5  . topiramate (TOPAMAX) 25 MG tablet Take 1 tablet (25 mg total) by mouth 2 (two) times daily. 120 tablet 3   No current facility-administered medications on file prior to visit.    Allergies  Allergen Reactions  . Codeine     REACTION: upset stomach once, but has taken it other times and been ok  . Lisinopril     cough  . Sulfonamide Derivatives Hives     Family History  Problem Relation Age of Onset  . Hypertension Father   . Alcohol abuse Father   . Arthritis Father   . Hypertension Mother   . Arthritis Mother   . Cancer Neg Hx   . Heart disease Neg Hx     Social History   Social History  . Marital Status: Married    Spouse Name: N/A  . Number of Children: N/A  . Years of Education: N/A   Social History Main Topics  . Smoking status: Never Smoker   . Smokeless tobacco: Never Used  . Alcohol Use: No  . Drug Use: No  . Sexual Activity: Yes    Birth Control/ Protection: Condom   Other Topics Concern  . None   Social History Narrative   Regular exercise: 2 x weekly   Caffeine use:  1 coffee daily          Review of Systems - See HPI.  All other ROS are negative.  BP 118/88 mmHg  Pulse 74  Temp(Src) 98.1 F (36.7 C) (Oral)  Resp 16  Ht 5\' 3"  (1.6 m)  Wt 144 lb (65.318 kg)  BMI 25.51 kg/m2  SpO2 99%  LMP 07/04/2015  Physical Exam  Constitutional: She is oriented to person, place, and time and well-developed, well-nourished, and  in no distress.  HENT:  Head: Normocephalic and atraumatic.  Mouth/Throat: Uvula is midline, oropharynx is clear and moist and mucous membranes are normal.  Eyes: Conjunctivae are normal.  Cardiovascular: Normal rate, regular rhythm, normal heart sounds and intact distal pulses.   Pulmonary/Chest: Effort normal and breath sounds normal. No respiratory distress. She has no wheezes. She has no rales. She exhibits no tenderness.  Neurological: She is alert and oriented to person, place, and time.  Skin: Rash noted. Rash is maculopapular.  Maculopapular rash of bilateral plantar surfaces of feet with similar lesions beginning on palms of hand bilaterally.  Vitals reviewed.   Recent Results (from the past 2160 hour(s))  Basic metabolic panel     Status: None   Collection Time: 06/25/15  7:40 AM  Result Value Ref Range   Sodium 138 135 - 145 mEq/L   Potassium 3.7 3.5 - 5.1 mEq/L    Chloride 104 96 - 112 mEq/L   CO2 29 19 - 32 mEq/L   Glucose, Bld 92 70 - 99 mg/dL   BUN 19 6 - 23 mg/dL   Creatinine, Ser 0.82 0.40 - 1.20 mg/dL   Calcium 9.5 8.4 - 10.5 mg/dL   GFR 85.50 >60.00 mL/min  CBC with Differential/Platelet     Status: None   Collection Time: 06/25/15  7:40 AM  Result Value Ref Range   WBC 6.4 4.0 - 10.5 K/uL   RBC 4.85 3.87 - 5.11 Mil/uL   Hemoglobin 14.3 12.0 - 15.0 g/dL   HCT 42.3 36.0 - 46.0 %   MCV 87.2 78.0 - 100.0 fl   MCHC 33.7 30.0 - 36.0 g/dL   RDW 12.5 11.5 - 15.5 %   Platelets 230.0 150.0 - 400.0 K/uL   Neutrophils Relative % 56.1 43.0 - 77.0 %   Lymphocytes Relative 31.8 12.0 - 46.0 %   Monocytes Relative 9.8 3.0 - 12.0 %   Eosinophils Relative 1.9 0.0 - 5.0 %   Basophils Relative 0.4 0.0 - 3.0 %   Neutro Abs 3.6 1.4 - 7.7 K/uL   Lymphs Abs 2.0 0.7 - 4.0 K/uL   Monocytes Absolute 0.6 0.1 - 1.0 K/uL   Eosinophils Absolute 0.1 0.0 - 0.7 K/uL   Basophils Absolute 0.0 0.0 - 0.1 K/uL  Hepatic function panel     Status: None   Collection Time: 06/25/15  7:40 AM  Result Value Ref Range   Total Bilirubin 0.5 0.2 - 1.2 mg/dL   Bilirubin, Direct 0.1 0.0 - 0.3 mg/dL   Alkaline Phosphatase 64 39 - 117 U/L   AST 20 0 - 37 U/L   ALT 14 0 - 35 U/L   Total Protein 7.1 6.0 - 8.3 g/dL   Albumin 4.3 3.5 - 5.2 g/dL  Urinalysis, Routine w reflex microscopic (not at [facility])     Status: None   Collection Time: 06/25/15  7:40 AM  Result Value Ref Range   Color, Urine YELLOW Yellow;Lt. Yellow   APPearance CLEAR Clear   Specific Gravity, Urine 1.010 1.000-1.030   pH 6.5 5.0 - 8.0   Total Protein, Urine NEGATIVE Negative   Urine Glucose NEGATIVE Negative   Ketones, ur NEGATIVE Negative   Bilirubin Urine NEGATIVE Negative   Hgb urine dipstick NEGATIVE Negative   Urobilinogen, UA 0.2 0.0 - 1.0   Leukocytes, UA NEGATIVE Negative   Nitrite NEGATIVE Negative   WBC, UA none seen 0-2/hpf   RBC / HPF none seen 0-2/hpf   Squamous Epithelial / LPF  Rare(0-4/hpf) Rare(0-4/hpf)  TSH     Status: None   Collection Time: 06/25/15  7:40 AM  Result Value Ref Range   TSH 2.39 0.35 - 4.50 uIU/mL  Lipid panel     Status: Abnormal   Collection Time: 06/25/15  7:40 AM  Result Value Ref Range   Cholesterol 147 0 - 200 mg/dL    Comment: ATP III Classification       Desirable:  < 200 mg/dL               Borderline High:  200 - 239 mg/dL          High:  > = 240 mg/dL   Triglycerides 116.0 0.0 - 149.0 mg/dL    Comment: Normal:  <150 mg/dLBorderline High:  150 - 199 mg/dL   HDL 33.80 (L) >39.00 mg/dL   VLDL 23.2 0.0 - 40.0 mg/dL   LDL Cholesterol 90 0 - 99 mg/dL   Total CHOL/HDL Ratio 4     Comment:                Men          Women1/2 Average Risk     3.4          3.3Average Risk          5.0          4.42X Average Risk          9.6          7.13X Average Risk          15.0          11.0                       NonHDL 113.26     Comment: NOTE:  Non-HDL goal should be 30 mg/dL higher than patient's LDL goal (i.e. LDL goal of < 70 mg/dL, would have non-HDL goal of < 100 mg/dL)    Assessment/Plan: Hand, foot and mouth disease Developing rash and symptoms seem consistent with virus. Supportive measures discussed. Measures for pruritus reviewed. Follow-up if new symptoms present of if anything acutely worsens. Rash will heal on its own.

## 2015-07-30 NOTE — Patient Instructions (Addendum)
Please stay well hydrated and get plenty of rest. Take a Benadryl at bedtime and Claritin in the morning for itch and irritation. Sarna lotion (over-the-counter) will also be beneficial. Keep skin clean and dry.  Hand, Foot, and Mouth Disease Hand, foot, and mouth disease is a common viral illness. It occurs mainly in children younger than 33 years of age, but adolescents and adults may also get it. This disease is different than foot and mouth disease that cattle, sheep, and pigs get. Most people are better in 1 week.  CAUSES  Hand, foot, and mouth disease is usually caused by a group of viruses called enteroviruses. Hand, foot, and mouth disease can spread from person to person (contagious). A person is most contagious during the first week of the illness. It is not transmitted to or from pets or other animals. It is most common in the summer and early fall. Infection is spread from person to person by direct contact with an infected person's:  Nose discharge.  Throat discharge.  Stool.   SYMPTOMS  Open sores (ulcers) occur in the mouth. Symptoms may also include:  A rash on the hands and feet, and occasionally the buttocks.  Fever.  Aches.  Pain from the mouth ulcers.  Fussiness. DIAGNOSIS  Hand, foot, and mouth disease is one of many infections that cause mouth sores. To be certain your child has hand, foot, and mouth disease your caregiver will diagnose your child by physical exam.Additional tests are not usually needed. TREATMENT  Nearly all patients recover without medical treatment in 7 to 10 days. There are no common complications. Your child should only take over-the-counter or prescription medicines for pain, discomfort, or fever as directed by your caregiver. Your caregiver may recommend the use of an over-the-counter antacid or a combination of an antacid and diphenhydramine to help coat the lesions in the mouth and improve symptoms.  HOME CARE INSTRUCTIONS  Try  combinations of foods to see what your child will tolerate and aim for a balanced diet. Soft foods may be easier to swallow. The mouth sores from hand, foot, and mouth disease typically hurt and are painful when exposed to salty, spicy, or acidic food or drinks.  Milk and cold drinks are soothing for some patients. Milk shakes, frozen ice pops, slushies, and sherberts are usually well tolerated.  Sport drinks are good choices for hydration, and they also provide a few calories. Often, a child with hand, foot, and mouth disease will be able to drink without discomfort.   For younger children and infants, feeding with a cup, spoon, or syringe may be less painful than drinking through the nipple of a bottle.  Keep children out of childcare programs, schools, or other group settings during the first few days of the illness or until they are without fever. The sores on the body are not contagious. SEEK IMMEDIATE MEDICAL CARE IF:  Your child develops signs of dehydration such as:  Decreased urination.  Dry mouth, tongue, or lips.  Decreased tears or sunken eyes.  Dry skin.  Rapid breathing.  Fussy behavior.  Poor color or pale skin.  Fingertips taking longer than 2 seconds to turn pink after a gentle squeeze.  Rapid weight loss.  Your child does not have adequate pain relief.  Your child develops a severe headache, stiff neck, or change in behavior.  Your child develops ulcers or blisters that occur on the lips or outside of the mouth. Document Released: 07/25/2003 Document Revised: 01/18/2012 Document Reviewed:  04/09/2011 ExitCare Patient Information 2015 [place], [place]. This information is not intended to replace advice given to you by your health care Apollonia Amini. Make sure you discuss any questions you have with your health care Marlisa Caridi.

## 2015-07-30 NOTE — Progress Notes (Signed)
Pre visit review using our clinic review tool, if applicable. No additional management support is needed unless otherwise documented below in the visit note/SLS  

## 2015-07-31 NOTE — Assessment & Plan Note (Signed)
Developing rash and symptoms seem consistent with virus. Supportive measures discussed. Measures for pruritus reviewed. Follow-up if new symptoms present of if anything acutely worsens. Rash will heal on its own.

## 2015-09-12 NOTE — Telephone Encounter (Signed)
Error

## 2015-09-17 ENCOUNTER — Telehealth: Payer: Self-pay | Admitting: Neurology

## 2015-09-17 ENCOUNTER — Ambulatory Visit (INDEPENDENT_AMBULATORY_CARE_PROVIDER_SITE_OTHER): Payer: 59 | Admitting: Neurology

## 2015-09-17 ENCOUNTER — Encounter: Payer: Self-pay | Admitting: Neurology

## 2015-09-17 VITALS — BP 121/80 | HR 79 | Ht 63.0 in | Wt 146.5 lb

## 2015-09-17 DIAGNOSIS — G43009 Migraine without aura, not intractable, without status migrainosus: Secondary | ICD-10-CM | POA: Diagnosis not present

## 2015-09-17 MED ORDER — TOPIRAMATE 25 MG PO TABS: 25.0000 mg | ORAL_TABLET | Freq: Every day | ORAL | Status: DC

## 2015-09-17 NOTE — Telephone Encounter (Signed)
I called back.  Spoke with Manuela Schwartz.  She wanted to verify the change in direction.  Per today's note: I recommend she reduce Topamax dose to 25 mg once daily as her migraines seem a lot better.  She expressed understanding, and they will proceed with Rx as ordered.

## 2015-09-17 NOTE — Progress Notes (Signed)
[facility] [address] [place]. [place] [address] [phone]       OFFICE FOLLOW UP VISIT NOTE  Kaitlin. Kaitlin Foster Date of Birth:  [birth year] Medical Record Number:  [id number]   Referring MD:  Self  Reason for Referral:  Headaches  HPI: Kaitlin Foster is a 82 year pleasant Caucasian lady who has had chronic headaches since teenage years. 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 She underwent initial embolization 2 followed by craniotomy and excision of the AVM by Dr. Kalman Shan at [facility]. I reviewed her records in Care everywhere. She states the headaches change character after this. She has been having left frontal and temporal headaches once a week or so. She describes this as moderate in intensity 8/10 throbbing at times and pressure-like on other occasions. These last for several hours. Most of the time she doesn't take medications but when it becomes intolerable she takes migraine Excedrin which works fairly well. She has not been to urgent care or emergency room with these headaches. There are no apparent triggers for the headaches but they seem to be made worse by allergies, dehydration, stress and lack of sleep. She does get nausea and has light sensitivity and feels like lying down with the headaches. She has occasional visual spots with her headaches but no focal neurological symptoms. She does have some residual numbness on the left side of the face and mild facial weakness as a result of the surgery. She had follow-up catheter angiogram done in 2009 which showed that the AVM had not recurred but since then she's had no brain imaging studies or vascular imaging studies done. She does complain of tightness and pain in the back of her neck and muscle pulling sensation as well. She does take diltiazem for hypertension. She had tried propranolol for  migraine prophylaxis in the past but it did not help her. She denies drinking excessive caffeine. 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 No evidence of recurrent AVM or new intracranial abnormality.e returns for follow-up after last visit 2 months ago. She states her headaches are doing much better since she has started Topamax. She has had only one migraine in that tooth when she had a stomach bug. She declined and Excedrin which helped. She's had a total of only the minor headaches for the last 2 months. She is tolerating Topamax 25 twice daily fairly well but does have some mild tingling of her fingers and toes which seems to be improving. She's also been very aggressive with treating her allergies which also may have helped. 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 No evidence of recurrent AVM or new intracranial abnormality. ROS:   14 system review of systems is positive for headache, joint pain, allergies and all other systems negative PMH:  Past Medical History  Diagnosis Date  . Hypertension   . AVM (arteriovenous malformation)     leaky  . Migraines     Social History:  Social History   Social History  . Marital Status: Married    Spouse Name: N/A  . Number of Children: N/A  . Years of Education: N/A   Occupational History  . Not on  file.   Social History Main Topics  . Smoking status: Never Smoker   . Smokeless tobacco: Never Used  . Alcohol Use: No  . Drug Use: No  . Sexual Activity: Yes    Birth Control/ Protection: Condom   Other Topics Concern  . Not on file   Social History Narrative   Regular exercise: 2 x weekly   Caffeine use:  1 coffee daily          Medications:   Current Outpatient Prescriptions on File Prior to Visit  Medication Sig Dispense  Refill  . CARTIA XT 120 MG 24 hr capsule TAKE 1 CAPSULE BY MOUTH DAILY 90 capsule 1  . cetirizine (ZYRTEC) 10 MG tablet Take 10 mg by mouth daily as needed.     . clotrimazole (LOTRIMIN AF) 1 % cream Apply 1 application topically 2 (two) times daily. 30 g 0  . cyclobenzaprine (FLEXERIL) 5 MG tablet Take 1 tablet (5 mg total) by mouth 3 (three) times daily as needed for muscle spasms. 20 tablet 0  . eletriptan (RELPAX) 20 MG tablet Take 1 tablet (20 mg total) by mouth as needed for migraine or headache. May repeat in 2 hours if headache persists or recurs. 10 tablet 0  . etodolac (LODINE) 300 MG capsule Take 1 capsule (300 mg total) by mouth every 8 (eight) hours. (Patient taking differently: Take 300 mg by mouth every 8 (eight) hours as needed. ) 60 capsule 1  . fluticasone (FLONASE) 50 MCG/ACT nasal spray 2 sprays into each nostril daily 16 g 2  . montelukast (SINGULAIR) 10 MG tablet Take 1 tablet (10 mg total) by mouth at bedtime. 30 tablet 5   No current facility-administered medications on file prior to visit.    Allergies:   Allergies  Allergen Reactions  . Codeine     REACTION: upset stomach once, but has taken it other times and been ok  . Lisinopril     cough  . Sulfonamide Derivatives Hives    Physical Exam General: well developed, well nourished  young Caucasian lady, seated, in no evident distress Head: head normocephalic and atraumatic.   Neck: supple with no carotid or supraclavicular bruits Cardiovascular: regular rate and rhythm, no murmurs Musculoskeletal: Mild left frontal and temporal bony deformity at surgical site Skin:  no rash/petichiae Vascular:  Normal pulses all extremities  Neurologic Exam Mental Status: Awake and fully alert. Oriented to place and time. Recent and remote memory intact. Attention span, concentration and fund of knowledge appropriate. Mood and affect appropriate.  Cranial Nerves: Fundoscopic exam not done   Pupils equal, briskly reactive to  light. Extraocular movements full without nystagmus. Visual fields full to confrontation. Hearing intact. Mild left lower facial asymmetry. Facial sensation intact. Face, tongue, palate moves normally and symmetrically.  Motor: Normal bulk and tone. Normal strength in all tested extremity muscles. Sensory.: intact to touch , pinprick , position and vibratory sensation.  Coordination: Rapid alternating movements normal in all extremities. Finger-to-nose and heel-to-shin performed accurately bilaterally. Gait and Station: Arises from chair without difficulty. Stance is normal. Gait demonstrates normal stride length and balance . Able to heel, toe and tandem walk without difficulty.  Reflexes: 1+ and symmetric. Toes downgoing.       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 I recommend she reduce Topamax dose to 25 mg once daily as her migraines seem a lot better. She is to continue to use Excedrin or Relpax as needed. She was given a refill of Topamax. She was advised to return for follow-up in 3 months or call earlier if necessary.  Antony Contras, MD Note: This document was prepared with digital dictation and possible smart phrase technology. Any transcriptional errors that result from this process are unintentional.

## 2015-09-17 NOTE — Patient Instructions (Signed)
I had a long discussion with the patient with regards to her migraines and discussed role of triggers and avoiding them and answered questions. I recommend she reduce Topamax dose to 25 mg once daily as her migraines seem a lot better. She is to continue to use Excedrin or Relpax as needed. She was given a refill of Topamax. She was advised to return for follow-up in 3 months or call earlier if necessary.

## 2015-09-17 NOTE — Telephone Encounter (Signed)
[facility] needs clarification  on topiramate (TOPAMAX) 25 MG tablet. Please call [phone]

## 2015-09-18 ENCOUNTER — Other Ambulatory Visit: Payer: Self-pay | Admitting: Family

## 2015-09-30 ENCOUNTER — Encounter: Payer: Self-pay | Admitting: Neurology

## 2015-10-14 ENCOUNTER — Ambulatory Visit: Payer: 59 | Admitting: Neurology

## 2015-10-31 ENCOUNTER — Telehealth: Payer: Self-pay | Admitting: Family

## 2015-10-31 NOTE — Telephone Encounter (Signed)
Documented 10/31/15.

## 2015-10-31 NOTE — Telephone Encounter (Signed)
Pt had flu shot in late September, employee of [employer]

## 2015-11-01 ENCOUNTER — Other Ambulatory Visit: Payer: Self-pay

## 2015-11-01 DIAGNOSIS — G43009 Migraine without aura, not intractable, without status migrainosus: Secondary | ICD-10-CM

## 2015-11-01 MED ORDER — ELETRIPTAN HYDROBROMIDE 20 MG PO TABS: 20.0000 mg | ORAL_TABLET | ORAL | Status: DC | PRN

## 2015-11-28 ENCOUNTER — Telehealth: Payer: Self-pay | Admitting: Neurology

## 2015-11-28 DIAGNOSIS — G43009 Migraine without aura, not intractable, without status migrainosus: Secondary | ICD-10-CM

## 2015-11-28 MED ORDER — TOPIRAMATE 25 MG PO TABS: 25.0000 mg | ORAL_TABLET | Freq: Two times a day (BID) | ORAL | Status: DC

## 2015-11-28 NOTE — Telephone Encounter (Signed)
Per MyChart encounter, dose was increased to BID.  Pharmacy has now been notified.

## 2015-11-28 NOTE — Telephone Encounter (Signed)
Patient called regarding topiramate (TOPAMAX) 25 MG tablet, Pharmacy advised her to contact us regarding refill, patient states prescription was for once a day, Dr. Leonie Man increased to twice a day however Rx never was changed, Pharmacy needs to be notified so that patient can get refill.

## 2015-11-29 ENCOUNTER — Telehealth: Payer: Self-pay | Admitting: *Deleted

## 2015-11-29 MED ORDER — MONTELUKAST SODIUM 10 MG PO TABS: 10.0000 mg | ORAL_TABLET | Freq: Every day | ORAL | Status: DC

## 2015-11-29 MED FILL — TOPIRAMATE 25 MG TABLET: 25 | 90 days supply | Qty: 180 | Fill #0

## 2015-11-29 NOTE — Telephone Encounter (Signed)
Received fax from [facility] pharm. For refills on montelukast 10mg  daily. Refill sent. Pt is due for 6 month follow up with Melissa O'Sullivan,NP on 12/26/15.  Please call pt to arrange f/u. Thanks!

## 2015-12-02 NOTE — Telephone Encounter (Signed)
Called and informed pt. ALSO, scheduled pt's appt.

## 2015-12-17 ENCOUNTER — Ambulatory Visit (INDEPENDENT_AMBULATORY_CARE_PROVIDER_SITE_OTHER): Payer: 59 | Admitting: Family

## 2015-12-17 ENCOUNTER — Encounter: Payer: Self-pay | Admitting: Family

## 2015-12-17 VITALS — BP 121/85 | HR 77 | Temp 98.3°F | Resp 16 | Ht 63.0 in | Wt 150.0 lb

## 2015-12-17 DIAGNOSIS — G43009 Migraine without aura, not intractable, without status migrainosus: Secondary | ICD-10-CM

## 2015-12-17 DIAGNOSIS — I10 Essential (primary) hypertension: Secondary | ICD-10-CM

## 2015-12-17 LAB — BASIC METABOLIC PANEL
BUN: 14 mg/dL (ref 6–23)
CHLORIDE: 107 meq/L (ref 96–112)
CO2: 28 mEq/L (ref 19–32)
Calcium: 9.2 mg/dL (ref 8.4–10.5)
Creatinine, Ser: 0.78 mg/dL (ref 0.40–1.20)
GFR: 90.31 mL/min (ref >60.00)
GLUCOSE: 91 mg/dL (ref 70–99)
POTASSIUM: 3.5 meq/L (ref 3.5–5.1)
SODIUM: 140 meq/L (ref 135–145)

## 2015-12-17 NOTE — Progress Notes (Signed)
Pre visit review using our clinic review tool, if applicable. No additional management support is needed unless otherwise documented below in the visit note. 

## 2015-12-17 NOTE — Assessment & Plan Note (Signed)
Stable on diltiazem continue same.

## 2015-12-17 NOTE — Progress Notes (Signed)
Subjective:    Patient ID: Kaitlin Foster, female    DOB: Apr 27, 1981, 35 y.o.   MRN: [id number]  HPI  HTN- reports good compliance with diltiazem. BP Readings from Last 3 Encounters:  12/17/15 121/85  09/17/15 121/80  07/30/15 118/88   Migraines- saw Dr. Leonie Man last week. Had  A CT scan. Was started on relpax and topamax.  This is helping her. She is in a research trial.   Allergic rhinitis-  She continues singulair.      Review of Systems See HPI  Past Medical History  Diagnosis Date  . Hypertension   . AVM (arteriovenous malformation)     leaky  . Migraines     Social History   Social History  . Marital Status: Married    Spouse Name: N/A  . Number of Children: N/A  . Years of Education: N/A   Occupational History  . Not on file.   Social History Main Topics  . Smoking status: Never Smoker   . Smokeless tobacco: Never Used  . Alcohol Use: No  . Drug Use: No  . Sexual Activity: Yes    Birth Control/ Protection: Condom   Other Topics Concern  . Not on file   Social History Narrative   Regular exercise: 2 x weekly   Caffeine use:  1 coffee daily          Past Surgical History  Procedure Laterality Date  . Emobilization    . Craniotomy  2008    left frontal AVM    Family History  Problem Relation Age of Onset  . Hypertension Father   . Alcohol abuse Father   . Arthritis Father   . Hypertension Mother   . Arthritis Mother   . Migraines Mother   . Cancer Neg Hx   . Heart disease Neg Hx   . Migraines Sister   . Migraines Maternal Aunt     Allergies  Allergen Reactions  . Codeine     REACTION: upset stomach once, but has taken it other times and been ok  . Lisinopril     cough  . Sulfonamide Derivatives Hives    Current Outpatient Prescriptions on File Prior to Visit  Medication Sig Dispense Refill  . cetirizine (ZYRTEC) 10 MG tablet Take 10 mg by mouth daily as needed.     . clotrimazole (LOTRIMIN AF) 1 % cream Apply 1 application  topically 2 (two) times daily. 30 g 0  . cyclobenzaprine (FLEXERIL) 5 MG tablet Take 1 tablet (5 mg total) by mouth 3 (three) times daily as needed for muscle spasms. 20 tablet 0  . diltiazem (CARDIZEM CD) 120 MG 24 hr capsule TAKE 1 CAPSULE BY MOUTH DAILY 90 capsule 1  . eletriptan (RELPAX) 20 MG tablet Take 1 tablet (20 mg total) by mouth as needed for migraine. May repeat in 2 hours if headache persists or recurs. 10 tablet 3  . etodolac (LODINE) 300 MG capsule Take 1 capsule (300 mg total) by mouth every 8 (eight) hours. (Patient taking differently: Take 300 mg by mouth every 8 (eight) hours as needed. ) 60 capsule 1  . fluticasone (FLONASE) 50 MCG/ACT nasal spray 2 sprays into each nostril daily (Patient taking differently: Place 2 sprays into both nostrils daily as needed. 2 sprays into each nostril daily) 16 g 2  . montelukast (SINGULAIR) 10 MG tablet Take 1 tablet (10 mg total) by mouth at bedtime. 30 tablet 1  . topiramate (TOPAMAX) 25 MG tablet  Take 1 tablet (25 mg total) by mouth 2 (two) times daily. 180 tablet 0   No current facility-administered medications on file prior to visit.    BP 121/85 mmHg  Pulse 77  Temp(Src) 98.3 F (36.8 C) (Oral)  Resp 16  Ht 5\' 3"  (1.6 m)  Wt 150 lb (68.04 kg)  BMI 26.58 kg/m2  SpO2 100%  LMP 11/25/2015       Objective:   Physical Exam  Constitutional: She is oriented to person, place, and time. She appears well-developed and well-nourished.  Eyes: EOM are normal.  Cardiovascular: Normal rate, regular rhythm and normal heart sounds.   No murmur heard. Pulmonary/Chest: Effort normal and breath sounds normal. No respiratory distress. She has no wheezes.  Musculoskeletal: She exhibits no edema.  Neurological: She is alert and oriented to person, place, and time.  Psychiatric: She has a normal mood and affect. Her behavior is normal. Judgment and thought content normal.          Assessment & Plan:

## 2015-12-17 NOTE — Patient Instructions (Signed)
Please follow up after 8/16 for annual physical. Complete lab work prior to leaving.

## 2015-12-17 NOTE — Assessment & Plan Note (Signed)
Improved on topamax and relpax, following with neurology.

## 2015-12-25 ENCOUNTER — Ambulatory Visit: Payer: 59 | Admitting: Neurology

## 2016-01-01 MED FILL — MONTELUKAST SOD 10 MG TAB: 10 | 30 days supply | Qty: 30 | Fill #0

## 2016-01-01 MED FILL — CARTIA XT 120 MG CAPSULE: 120 | 90 days supply | Qty: 90 | Fill #1

## 2016-01-06 ENCOUNTER — Telehealth: Payer: 59 | Admitting: Family

## 2016-01-06 DIAGNOSIS — J329 Chronic sinusitis, unspecified: Secondary | ICD-10-CM

## 2016-01-06 DIAGNOSIS — B9689 Other specified bacterial agents as the cause of diseases classified elsewhere: Secondary | ICD-10-CM

## 2016-01-06 DIAGNOSIS — A499 Bacterial infection, unspecified: Secondary | ICD-10-CM

## 2016-01-06 MED ORDER — AMOXICILLIN-POT CLAVULANATE 875-125 MG PO TABS: 1.0000 | ORAL_TABLET | Freq: Two times a day (BID) | ORAL | Status: AC

## 2016-01-06 MED FILL — AMOX-CLAV 875-125 MG TABLET: 875-125 | 7 days supply | Qty: 14 | Fill #0

## 2016-01-06 NOTE — Progress Notes (Signed)

## 2016-01-31 MED FILL — MONTELUKAST SOD 10 MG TAB: 10 | 30 days supply | Qty: 30 | Fill #1

## 2016-03-04 ENCOUNTER — Telehealth: Payer: Self-pay | Admitting: *Deleted

## 2016-03-04 MED ORDER — MONTELUKAST SODIUM 10 MG PO TABS: 10.0000 mg | ORAL_TABLET | Freq: Every day | ORAL | Status: DC

## 2016-03-04 NOTE — Telephone Encounter (Signed)
Received request from [facility] for singulair refills. Refills sent.

## 2016-03-05 MED FILL — MONTELUKAST SOD 10 MG TAB: 10 | 90 days supply | Qty: 90 | Fill #0

## 2016-03-16 ENCOUNTER — Encounter: Payer: Self-pay | Admitting: Medical

## 2016-03-16 ENCOUNTER — Ambulatory Visit (INDEPENDENT_AMBULATORY_CARE_PROVIDER_SITE_OTHER): Payer: 59 | Admitting: Medical

## 2016-03-16 VITALS — BP 116/78 | HR 92 | Temp 98.1°F | Ht 63.0 in | Wt 156.2 lb

## 2016-03-16 DIAGNOSIS — J309 Allergic rhinitis, unspecified: Secondary | ICD-10-CM | POA: Diagnosis not present

## 2016-03-16 DIAGNOSIS — R52 Pain, unspecified: Secondary | ICD-10-CM

## 2016-03-16 DIAGNOSIS — J0100 Acute maxillary sinusitis, unspecified: Secondary | ICD-10-CM | POA: Diagnosis not present

## 2016-03-16 DIAGNOSIS — R6883 Chills (without fever): Secondary | ICD-10-CM | POA: Diagnosis not present

## 2016-03-16 DIAGNOSIS — J029 Acute pharyngitis, unspecified: Secondary | ICD-10-CM | POA: Diagnosis not present

## 2016-03-16 LAB — POC INFLUENZA A&B (BINAX/QUICKVUE)
INFLUENZA B, POC: NEGATIVE
Influenza A, POC: NEGATIVE

## 2016-03-16 LAB — POCT RAPID STREP A (OFFICE): RAPID STREP A SCREEN: NEGATIVE

## 2016-03-16 MED ORDER — CEFDINIR 300 MG PO CAPS: 300.0000 mg | ORAL_CAPSULE | Freq: Two times a day (BID) | ORAL | Status: DC

## 2016-03-16 MED ORDER — METHYLPREDNISOLONE ACETATE 40 MG/ML IJ SUSP
40.0000 mg | Freq: Once | INTRAMUSCULAR | Status: AC
  Administered 2016-03-16: 40 mg via INTRAMUSCULAR

## 2016-03-16 MED FILL — CEFDINIR 300 MG CAPSULE: 300 | 10 days supply | Qty: 20 | Fill #0

## 2016-03-16 NOTE — Addendum Note (Signed)
Addended by: Tasia Catchings on: 03/16/2016 12:13 PM   Modules accepted: Orders

## 2016-03-16 NOTE — Patient Instructions (Addendum)
For allergies continue you Singulair, Flonase and zyrtec. Since you have moderate to severe nasal congestion on exam will also give DepoMedrol 40 mg to give some relief form congestion.  For sinusitis and pharyngitis. Rx of cefdnir today.  Rest and hydrate. Ibuprofen for bodyaches starting tomorrow if needed  Follow up 7 days or as needed

## 2016-03-16 NOTE — Progress Notes (Signed)
Subjective:    Patient ID: Kaitlin Foster, female    DOB: November 26, 1980, 35 y.o.   MRN: [id number]  HPI  Pt in states last couple of days she has been getting puffy cheeks, lt lower eye lid swollen on  left side, and nasal congestion. Then a lot of runny nose. Pt is taking singulair, flonase and zyrtec.  Pt does have sore throat hurts to swallow started last night. Left ear pain on Saturday. Now some sinus pressure.  Yesterday during the day some body aches.  LMP- 2 weeks ago.  Review of Systems  Constitutional: Positive for fever and chills. Negative for fatigue.  HENT: Positive for congestion, sinus pressure, sneezing and sore throat. Negative for postnasal drip and rhinorrhea.   Respiratory: Negative for cough, chest tightness, shortness of breath and wheezing.   Cardiovascular: Negative for chest pain and palpitations.  Gastrointestinal: Negative for nausea, abdominal pain and constipation.  Musculoskeletal: Positive for myalgias.  Skin: Negative for rash.  Neurological: Negative for dizziness, syncope, weakness and headaches.  Hematological: Negative for adenopathy. Does not bruise/bleed easily.  Psychiatric/Behavioral: Negative for behavioral problems and confusion.    Past Medical History  Diagnosis Date  . Hypertension   . AVM (arteriovenous malformation)     leaky  . Migraines      Social History   Social History  . Marital Status: Married    Spouse Name: N/A  . Number of Children: N/A  . Years of Education: N/A   Occupational History  . Not on file.   Social History Main Topics  . Smoking status: Never Smoker   . Smokeless tobacco: Never Used  . Alcohol Use: No  . Drug Use: No  . Sexual Activity: Yes    Birth Control/ Protection: Condom   Other Topics Concern  . Not on file   Social History Narrative   Regular exercise: 2 x weekly   Caffeine use:  1 coffee daily          Past Surgical History  Procedure Laterality Date  . Emobilization    .  Craniotomy  2008    left frontal AVM    Family History  Problem Relation Age of Onset  . Hypertension Father   . Alcohol abuse Father   . Arthritis Father   . Hypertension Mother   . Arthritis Mother   . Migraines Mother   . Cancer Neg Hx   . Heart disease Neg Hx   . Migraines Sister   . Migraines Maternal Aunt     Allergies  Allergen Reactions  . Codeine     REACTION: upset stomach once, but has taken it other times and been ok  . Lisinopril     cough  . Sulfonamide Derivatives Hives    Current Outpatient Prescriptions on File Prior to Visit  Medication Sig Dispense Refill  . cetirizine (ZYRTEC) 10 MG tablet Take 10 mg by mouth daily as needed.     . clotrimazole (LOTRIMIN AF) 1 % cream Apply 1 application topically 2 (two) times daily. 30 g 0  . cyclobenzaprine (FLEXERIL) 5 MG tablet Take 1 tablet (5 mg total) by mouth 3 (three) times daily as needed for muscle spasms. 20 tablet 0  . diltiazem (CARDIZEM CD) 120 MG 24 hr capsule TAKE 1 CAPSULE BY MOUTH DAILY 90 capsule 1  . eletriptan (RELPAX) 20 MG tablet Take 1 tablet (20 mg total) by mouth as needed for migraine. May repeat in 2 hours if headache persists  or recurs. 10 tablet 3  . etodolac (LODINE) 300 MG capsule Take 1 capsule (300 mg total) by mouth every 8 (eight) hours. (Patient taking differently: Take 300 mg by mouth every 8 (eight) hours as needed. ) 60 capsule 1  . fluticasone (FLONASE) 50 MCG/ACT nasal spray 2 sprays into each nostril daily (Patient taking differently: Place 2 sprays into both nostrils daily as needed. 2 sprays into each nostril daily) 16 g 2  . montelukast (SINGULAIR) 10 MG tablet Take 1 tablet (10 mg total) by mouth at bedtime. 30 tablet 5  . topiramate (TOPAMAX) 25 MG tablet Take 1 tablet (25 mg total) by mouth 2 (two) times daily. 180 tablet 0   No current facility-administered medications on file prior to visit.    BP 116/78 mmHg  Pulse 92  Temp(Src) 98.1 F (36.7 C) (Oral)  Ht 5\' 3"   (1.6 m)  Wt 156 lb 3.2 oz (70.852 kg)  BMI 27.68 kg/m2  SpO2 98%  LMP 03/02/2016       Objective:   Physical Exam  General  Mental Status - Alert. General Appearance - Well groomed. Not in acute distress.  Skin Rashes- No Rashes.  HEENT Head- Normal. Ear Auditory Canal - Left- Normal. Right - Normal.Tympanic Membrane- Left- Normal. Right- Normal. Eye Sclera/Conjunctiva- Left- Normal. Right- Normal. Nose & Sinuses Nasal Mucosa- Left-  Boggy and Congested. Right-  Boggy and  Congested.Left maxillary sinus pain.but no frontal sinus pressure. Mouth & Throat Lips: Upper Lip- Normal: no dryness, cracking, pallor, cyanosis, or vesicular eruption. Lower Lip-Normal: no dryness, cracking, pallor, cyanosis or vesicular eruption. Buccal Mucosa- Bilateral- No Aphthous ulcers. Oropharynx- No Discharge or Erythema. Tonsils: Characteristics- Bilateral-moderate bright  Erythema and Congestion. Size/Enlargement- Bilateral- No enlargement. Discharge- bilateral-None.  Neck Neck- Supple. No Masses.   Chest and Lung Exam Auscultation: Breath Sounds:-Clear even and unlabored.  Cardiovascular Auscultation:Rythm- Regular, rate and rhythm. Murmurs & Other Heart Sounds:Ausculatation of the heart reveal- No Murmurs.  Lymphatic Head & Neck General Head & Neck Lymphatics: Bilateral: Description- No Localized lymphadenopathy.       Assessment & Plan:  Note strep and flu test were negative.  For allergies continue you Singulair, Flonase and zyrtec. Since you have moderate to severe nasal  congestion on exam will also give DepoMedrol 40 mg to give some relief form congestion.  For sinusitis and pharyngitis. Rx of cefdnir today.  Rest and hydrate. Ibuprofen for bodyaches starting tomorrow if needed  Follow up 7 days or as needed  Follow up 7 days or as needed  Kaitlin Foster, Percell Miller, [employer]

## 2016-03-16 NOTE — Progress Notes (Signed)
Pre visit review using our clinic review tool, if applicable. No additional management support is needed unless otherwise documented below in the visit note. 

## 2016-03-18 ENCOUNTER — Encounter: Payer: Self-pay | Admitting: Family

## 2016-03-18 DIAGNOSIS — J302 Other seasonal allergic rhinitis: Secondary | ICD-10-CM

## 2016-03-19 ENCOUNTER — Encounter: Payer: Self-pay | Admitting: Family

## 2016-04-03 ENCOUNTER — Other Ambulatory Visit: Payer: Self-pay | Admitting: Family

## 2016-04-03 DIAGNOSIS — H1045 Other chronic allergic conjunctivitis: Secondary | ICD-10-CM | POA: Diagnosis not present

## 2016-04-03 DIAGNOSIS — J309 Allergic rhinitis, unspecified: Secondary | ICD-10-CM | POA: Diagnosis not present

## 2016-04-03 DIAGNOSIS — J3089 Other allergic rhinitis: Secondary | ICD-10-CM | POA: Diagnosis not present

## 2016-04-03 DIAGNOSIS — J301 Allergic rhinitis due to pollen: Secondary | ICD-10-CM | POA: Diagnosis not present

## 2016-04-03 MED FILL — LEVOCETIRIZINE 5 MG TABLET: 5 | 90 days supply | Qty: 90 | Fill #0

## 2016-04-03 MED FILL — AZELASTINE HCL 137 MCG SPRY: 0.1 | 30 days supply | Qty: 30 | Fill #0

## 2016-04-03 MED FILL — AZELASTINE HCL 0.05% DROPS: 0.05 | 60 days supply | Qty: 6 | Fill #0

## 2016-04-03 MED FILL — CARTIA XT 120 MG CAPSULE: 120 | 90 days supply | Qty: 90 | Fill #0

## 2016-05-28 MED FILL — RELPAX 20 MG TABLET: 20 | 30 days supply | Qty: 9 | Fill #0 | Status: TO

## 2016-06-01 ENCOUNTER — Telehealth: Payer: 59 | Admitting: Nurse Practitioner

## 2016-06-01 DIAGNOSIS — M545 Low back pain: Secondary | ICD-10-CM | POA: Diagnosis not present

## 2016-06-01 MED ORDER — CYCLOBENZAPRINE HCL 10 MG PO TABS: 10.0000 mg | ORAL_TABLET | Freq: Three times a day (TID) | ORAL | 0 refills | Status: DC | PRN

## 2016-06-01 MED ORDER — ETODOLAC 300 MG PO CAPS: 300.0000 mg | ORAL_CAPSULE | Freq: Two times a day (BID) | ORAL | 0 refills | Status: DC

## 2016-06-01 MED FILL — ETODOLAC 300 MG CAPSULE: 300 | 30 days supply | Qty: 60 | Fill #0

## 2016-06-01 MED FILL — CYCLOBENZAPRINE 10 MG TAB: 10 | 10 days supply | Qty: 30 | Fill #0

## 2016-06-01 NOTE — Progress Notes (Signed)

## 2016-06-02 ENCOUNTER — Ambulatory Visit: Payer: 59 | Admitting: Family

## 2016-06-16 MED FILL — MONTELUKAST SOD 10 MG TAB: 10 | 90 days supply | Qty: 90 | Fill #1

## 2016-06-26 ENCOUNTER — Telehealth: Payer: Self-pay | Admitting: Family

## 2016-06-26 ENCOUNTER — Ambulatory Visit (INDEPENDENT_AMBULATORY_CARE_PROVIDER_SITE_OTHER): Payer: 59 | Admitting: Family

## 2016-06-26 ENCOUNTER — Encounter: Payer: Self-pay | Admitting: Family

## 2016-06-26 VITALS — BP 118/80 | HR 85 | Temp 98.2°F | Resp 16 | Ht 63.0 in | Wt 157.0 lb

## 2016-06-26 DIAGNOSIS — Z0000 Encounter for general adult medical examination without abnormal findings: Secondary | ICD-10-CM

## 2016-06-26 DIAGNOSIS — D649 Anemia, unspecified: Secondary | ICD-10-CM

## 2016-06-26 LAB — URINALYSIS, ROUTINE W REFLEX MICROSCOPIC
BILIRUBIN URINE: NEGATIVE
HGB URINE DIPSTICK: NEGATIVE
Ketones, ur: NEGATIVE
Leukocytes, UA: NEGATIVE
Nitrite: NEGATIVE
RBC / HPF: NONE SEEN (ref 0–?)
Specific Gravity, Urine: 1.02 (ref 1.000–1.030)
TOTAL PROTEIN, URINE-UPE24: NEGATIVE
Urine Glucose: NEGATIVE
Urobilinogen, UA: 0.2 (ref 0.0–1.0)
pH: 7 (ref 5.0–8.0)

## 2016-06-26 LAB — CBC WITH DIFFERENTIAL/PLATELET
BASOS ABS: 0 10*3/uL (ref 0.0–0.1)
Basophils Relative: 0.5 % (ref 0.0–3.0)
EOS ABS: 0.2 10*3/uL (ref 0.0–0.7)
Eosinophils Relative: 2.6 % (ref 0.0–5.0)
HEMATOCRIT: 36.4 % (ref 36.0–46.0)
Hemoglobin: 11.8 g/dL — ABNORMAL LOW (ref 12.0–15.0)
LYMPHS PCT: 30.7 % (ref 12.0–46.0)
Lymphs Abs: 1.9 10*3/uL (ref 0.7–4.0)
MCHC: 32.4 g/dL (ref 30.0–36.0)
MCV: 74.7 fl — ABNORMAL LOW (ref 78.0–100.0)
MONO ABS: 0.6 10*3/uL (ref 0.1–1.0)
Monocytes Relative: 9.4 % (ref 3.0–12.0)
NEUTROS ABS: 3.5 10*3/uL (ref 1.4–7.7)
NEUTROS PCT: 56.8 % (ref 43.0–77.0)
PLATELETS: 270 10*3/uL (ref 150.0–400.0)
RBC: 4.88 Mil/uL (ref 3.87–5.11)
RDW: 17 % — AB (ref 11.5–15.5)
WBC: 6.1 10*3/uL (ref 4.0–10.5)

## 2016-06-26 LAB — BASIC METABOLIC PANEL
BUN: 16 mg/dL (ref 6–23)
CALCIUM: 9.4 mg/dL (ref 8.4–10.5)
CO2: 26 meq/L (ref 19–32)
CREATININE: 0.84 mg/dL (ref 0.40–1.20)
Chloride: 109 mEq/L (ref 96–112)
GFR: 82.64 mL/min (ref >60.00)
Glucose, Bld: 86 mg/dL (ref 70–99)
Potassium: 3.8 mEq/L (ref 3.5–5.1)
Sodium: 140 mEq/L (ref 135–145)

## 2016-06-26 LAB — HEPATIC FUNCTION PANEL
ALK PHOS: 57 U/L (ref 39–117)
ALT: 14 U/L (ref 0–35)
AST: 20 U/L (ref 0–37)
Albumin: 4.4 g/dL (ref 3.5–5.2)
BILIRUBIN DIRECT: 0.1 mg/dL (ref 0.0–0.3)
BILIRUBIN TOTAL: 0.3 mg/dL (ref 0.2–1.2)
TOTAL PROTEIN: 7.1 g/dL (ref 6.0–8.3)

## 2016-06-26 LAB — LIPID PANEL
CHOL/HDL RATIO: 4
Cholesterol: 146 mg/dL (ref 0–200)
HDL: 39.6 mg/dL (ref >39.00)
LDL CALC: 86 mg/dL (ref 0–99)
NONHDL: 106.33
TRIGLYCERIDES: 101 mg/dL (ref 0.0–149.0)
VLDL: 20.2 mg/dL (ref 0.0–40.0)

## 2016-06-26 LAB — TSH: TSH: 1.95 u[IU]/mL (ref 0.35–4.50)

## 2016-06-26 MED ORDER — LEVOCETIRIZINE DIHYDROCHLORIDE 5 MG PO TABS: 5.0000 mg | ORAL_TABLET | Freq: Every evening | ORAL | Status: DC

## 2016-06-26 MED ORDER — AZELASTINE HCL 0.1 % NA SOLN: 1.0000 | Freq: Every day | NASAL | 12 refills | Status: DC

## 2016-06-26 NOTE — Patient Instructions (Addendum)
Please complete lab work prior to leaving. Schedule a follow up eye exam. Try to work on increasing exercise and improving diet.

## 2016-06-26 NOTE — Progress Notes (Signed)
Subjective:    Patient ID: Kaitlin Foster, female    DOB: 1981-03-15, 36 y.o.   MRN: [id number]  HPI  Patient presents today for complete physical.  Immunizations:  Up to date Diet: needs improvement  Wt Readings from Last 3 Encounters:  06/26/16 157 lb (71.2 kg)  03/16/16 156 lb 3.2 oz (70.9 kg)  12/17/15 150 lb (68 kg)  Exercise:  Not exercising Pap Smear: 02/08/15 Vision: due Dental: up to date      Review of Systems  Constitutional: Negative for unexpected weight change.  HENT: Positive for rhinorrhea.   Respiratory: Negative for cough and shortness of breath.   Cardiovascular: Negative for chest pain and leg swelling.  Gastrointestinal: Negative for constipation and diarrhea.  Genitourinary: Negative for dysuria and frequency.  Musculoskeletal: Negative for arthralgias and myalgias.       Some low back pai nand neck pain which she attributes to computer work and sitting  Skin: Negative for rash.  Neurological:       On topamax, relpax- seeing neuro, 1 migraine/month  Hematological: Negative for adenopathy.  Psychiatric/Behavioral:       Denies depression/anxiety       Past Medical History:  Diagnosis Date  . AVM (arteriovenous malformation)    leaky  . Hypertension   . Migraines      Social History   Social History  . Marital status: Married    Spouse name: N/A  . Number of children: N/A  . Years of education: N/A   Occupational History  . Not on file.   Social History Main Topics  . Smoking status: Never Smoker  . Smokeless tobacco: Never Used  . Alcohol use No  . Drug use: No  . Sexual activity: Yes    Birth control/ protection: Condom   Other Topics Concern  . Not on file   Social History Narrative   Regular exercise: 2 x weekly   Caffeine use:  1 coffee daily          Past Surgical History:  Procedure Laterality Date  . CRANIOTOMY  2008   left frontal AVM  . emobilization      Family History  Problem Relation Age of Onset    . Hypertension Father   . Alcohol abuse Father   . Arthritis Father   . Hypertension Mother   . Arthritis Mother   . Migraines Mother   . Migraines Sister   . Migraines Maternal Aunt   . Cancer Neg Hx   . Heart disease Neg Hx     Allergies  Allergen Reactions  . Codeine     REACTION: upset stomach once, but has taken it other times and been ok  . Lisinopril     cough  . Sulfonamide Derivatives Hives    Current Outpatient Prescriptions on File Prior to Visit  Medication Sig Dispense Refill  . CARTIA XT 120 MG 24 hr capsule TAKE 1 CAPSULE BY MOUTH ONCE DAILY 90 capsule 0  . clotrimazole (LOTRIMIN AF) 1 % cream Apply 1 application topically 2 (two) times daily. 30 g 0  . cyclobenzaprine (FLEXERIL) 10 MG tablet Take 1 tablet (10 mg total) by mouth 3 (three) times daily as needed for muscle spasms. 30 tablet 0  . etodolac (LODINE) 300 MG capsule Take 1 capsule (300 mg total) by mouth every 8 (eight) hours. (Patient taking differently: Take 300 mg by mouth every 8 (eight) hours as needed. ) 60 capsule 1  . etodolac (LODINE)  300 MG capsule Take 1 capsule (300 mg total) by mouth 2 (two) times daily. 60 capsule 0  . fluticasone (FLONASE) 50 MCG/ACT nasal spray 2 sprays into each nostril daily (Patient taking differently: Place 2 sprays into both nostrils daily as needed. 2 sprays into each nostril daily) 16 g 2  . montelukast (SINGULAIR) 10 MG tablet Take 1 tablet (10 mg total) by mouth at bedtime. 30 tablet 5  . topiramate (TOPAMAX) 25 MG tablet Take 1 tablet (25 mg total) by mouth 2 (two) times daily. 180 tablet 0   No current facility-administered medications on file prior to visit.     BP 118/80 (BP Location: Right Arm, Cuff Size: Normal)   Pulse 85   Temp 98.2 F (36.8 C) (Oral)   Resp 16   Ht 5\' 3"  (1.6 m)   Wt 157 lb (71.2 kg)   LMP 05/29/2016   SpO2 98% Comment: room air  BMI 27.81 kg/m    Objective:   Physical Exam  Physical Exam  Constitutional: She is oriented  to person, place, and time. She appears well-developed and well-nourished. No distress.  HENT:  Head: Normocephalic and atraumatic.  Right Ear: Tympanic membrane and ear canal normal.  Left Ear: Tympanic membrane and ear canal normal.  Mouth/Throat: Oropharynx is clear and moist.  Eyes: Pupils are equal, round, and reactive to light. No scleral icterus.  Neck: Normal range of motion. No thyromegaly present.  Cardiovascular: Normal rate and regular rhythm.   No murmur heard. Pulmonary/Chest: Effort normal and breath sounds normal. No respiratory distress. He has no wheezes. She has no rales. She exhibits no tenderness.  Abdominal: Soft. Bowel sounds are normal. She exhibits no distension and no mass. There is no tenderness. There is no rebound and no guarding.  Musculoskeletal: She exhibits no edema.  Lymphadenopathy:    She has no cervical adenopathy.  Neurological: She is alert and oriented to person, place, and time. She has normal patellar reflexes. She exhibits normal muscle tone. Coordination normal.  Skin: Skin is warm and dry.  Psychiatric: She has a normal mood and affect. Her behavior is normal. Judgment and thought content normal.  Breast/pelvic:  deferred        Assessment & Plan:         Assessment & Plan:

## 2016-06-26 NOTE — Telephone Encounter (Signed)
Lab work shows anemia. Could you please ask lab to add on serum iron and ferritin?  I would like pt to complete ifob as well pls

## 2016-06-26 NOTE — Progress Notes (Signed)
Pre visit review using our clinic review tool, if applicable. No additional management support is needed unless otherwise documented below in the visit note. 

## 2016-06-26 NOTE — Assessment & Plan Note (Signed)
Discuss healthy diet, exercise, weight loss. Immunizations reviewed and up to date. Pap up to date. Obtain routine lab work.

## 2016-06-28 ENCOUNTER — Encounter: Payer: Self-pay | Admitting: Family

## 2016-06-29 ENCOUNTER — Encounter: Payer: Self-pay | Admitting: Family

## 2016-06-29 NOTE — Telephone Encounter (Signed)
Called patient. Agreed to come in tomorrow to have labs drawn and pick up IFOB

## 2016-06-29 NOTE — Telephone Encounter (Signed)
Called patient today with lab results. Patient will have labs drawn when she comes in tomorrow to pick up IFOB. [facility] scheduled.

## 2016-06-30 ENCOUNTER — Other Ambulatory Visit (INDEPENDENT_AMBULATORY_CARE_PROVIDER_SITE_OTHER): Payer: 59

## 2016-06-30 DIAGNOSIS — D649 Anemia, unspecified: Secondary | ICD-10-CM | POA: Diagnosis not present

## 2016-06-30 LAB — FERRITIN: Ferritin: 6.6 ng/mL — ABNORMAL LOW (ref 10.0–291.0)

## 2016-06-30 LAB — IRON: Iron: 37 ug/dL — ABNORMAL LOW (ref 42–145)

## 2016-07-01 ENCOUNTER — Encounter: Payer: Self-pay | Admitting: Family

## 2016-07-01 ENCOUNTER — Other Ambulatory Visit: Payer: Self-pay | Admitting: Family

## 2016-07-01 DIAGNOSIS — D649 Anemia, unspecified: Secondary | ICD-10-CM

## 2016-07-01 MED ORDER — FERROUS FUMARATE-FOLIC ACID 324-1 MG PO TABS: 1.0000 | ORAL_TABLET | Freq: Every day | ORAL | 2 refills | Status: DC

## 2016-07-01 MED FILL — TOPIRAMATE 25 MG TABLET: 25 | 90 days supply | Qty: 90 | Fill #1

## 2016-07-01 MED FILL — CARTIA XT 120 MG CAPSULE SA: 120 | 90 days supply | Qty: 90 | Fill #0

## 2016-07-01 MED FILL — HEMATINIC-FOLIC ACID TABLET: 324-1 | 30 days supply | Qty: 30 | Fill #0

## 2016-07-01 MED FILL — ELETRIPTAN HBR 20 MG TABLET: 20 | 30 days supply | Qty: 9 | Fill #0

## 2016-07-01 NOTE — Telephone Encounter (Addendum)
-----  Message from Mosie Lukes, MD sent at 07/01/2016 11:21 AM EDT ----- Studies confirm low iron levels.needs to start Hemocyte F 1 tab po daily, disp #30 with 2 rf. Send them a set of hemeoccult cards and repeat CBC in 1-2 weeks.  Notified pt and she voices understanding. She already picked up IFOB kit when she completed labs. Placed future lab order and sent message to pt via mychart. Rx sent to pharmacy.

## 2016-07-14 ENCOUNTER — Other Ambulatory Visit: Payer: 59

## 2016-07-15 ENCOUNTER — Other Ambulatory Visit (INDEPENDENT_AMBULATORY_CARE_PROVIDER_SITE_OTHER): Payer: 59

## 2016-07-15 DIAGNOSIS — D649 Anemia, unspecified: Secondary | ICD-10-CM | POA: Diagnosis not present

## 2016-07-15 LAB — CBC WITH DIFFERENTIAL/PLATELET
Basophils Absolute: 0 10*3/uL (ref 0.0–0.1)
Basophils Relative: 0.7 % (ref 0.0–3.0)
Eosinophils Absolute: 0.1 10*3/uL (ref 0.0–0.7)
Eosinophils Relative: 1.6 % (ref 0.0–5.0)
HCT: 39.3 % (ref 36.0–46.0)
Hemoglobin: 12.9 g/dL (ref 12.0–15.0)
Lymphocytes Relative: 34.3 % (ref 12.0–46.0)
Lymphs Abs: 1.8 10*3/uL (ref 0.7–4.0)
MCHC: 32.8 g/dL (ref 30.0–36.0)
MCV: 78.4 fl (ref 78.0–100.0)
Monocytes Absolute: 0.5 10*3/uL (ref 0.1–1.0)
Monocytes Relative: 9.2 % (ref 3.0–12.0)
Neutro Abs: 2.9 10*3/uL (ref 1.4–7.7)
Neutrophils Relative %: 54.2 % (ref 43.0–77.0)
Platelets: 249 10*3/uL (ref 150.0–400.0)
RBC: 5.01 Mil/uL (ref 3.87–5.11)
RDW: 24 % — ABNORMAL HIGH (ref 11.5–15.5)
WBC: 5.4 10*3/uL (ref 4.0–10.5)

## 2016-07-16 NOTE — Progress Notes (Signed)
Erroneous encounter

## 2016-07-17 ENCOUNTER — Other Ambulatory Visit (INDEPENDENT_AMBULATORY_CARE_PROVIDER_SITE_OTHER): Payer: 59

## 2016-07-17 ENCOUNTER — Encounter: Payer: Self-pay | Admitting: Family

## 2016-07-17 DIAGNOSIS — D649 Anemia, unspecified: Secondary | ICD-10-CM | POA: Diagnosis not present

## 2016-07-17 LAB — FECAL OCCULT BLOOD, IMMUNOCHEMICAL: Fecal Occult Bld: NEGATIVE

## 2016-07-23 DIAGNOSIS — H5213 Myopia, bilateral: Secondary | ICD-10-CM | POA: Diagnosis not present

## 2016-07-23 DIAGNOSIS — I10 Essential (primary) hypertension: Secondary | ICD-10-CM | POA: Diagnosis not present

## 2016-07-23 DIAGNOSIS — H52223 Regular astigmatism, bilateral: Secondary | ICD-10-CM | POA: Diagnosis not present

## 2016-07-27 MED FILL — LEVOCETIRIZINE 5 MG TABLET: 5 | 30 days supply | Qty: 30 | Fill #1

## 2016-07-27 MED FILL — HEMATINIC-FOLIC ACID TABLET: 324-1 | 30 days supply | Qty: 30 | Fill #1

## 2016-08-10 DIAGNOSIS — Z6827 Body mass index (BMI) 27.0-27.9, adult: Secondary | ICD-10-CM | POA: Diagnosis not present

## 2016-08-10 DIAGNOSIS — Z01419 Encounter for gynecological examination (general) (routine) without abnormal findings: Secondary | ICD-10-CM | POA: Diagnosis not present

## 2016-09-11 ENCOUNTER — Telehealth: Payer: 59 | Admitting: Family

## 2016-09-11 DIAGNOSIS — B9689 Other specified bacterial agents as the cause of diseases classified elsewhere: Secondary | ICD-10-CM | POA: Diagnosis not present

## 2016-09-11 DIAGNOSIS — J028 Acute pharyngitis due to other specified organisms: Secondary | ICD-10-CM

## 2016-09-11 MED ORDER — BENZONATATE 100 MG PO CAPS: 100.0000 mg | ORAL_CAPSULE | Freq: Three times a day (TID) | ORAL | 0 refills | Status: DC | PRN

## 2016-09-11 MED ORDER — AZITHROMYCIN 250 MG PO TABS: ORAL_TABLET | ORAL | 0 refills | Status: DC

## 2016-09-11 MED ORDER — PREDNISONE 5 MG PO TABS: 5.0000 mg | ORAL_TABLET | ORAL | 0 refills | Status: DC

## 2016-09-11 MED FILL — ELETRIPTAN HBR 20 MG TABLET: 20 | 30 days supply | Qty: 9 | Fill #1

## 2016-09-11 MED FILL — predniSONE 5 MG TABS: 5 | 6 days supply | Qty: 21 | Fill #0

## 2016-09-11 MED FILL — AZITHROMYCIN 250 MG TABLET: 250 | 5 days supply | Qty: 6 | Fill #0

## 2016-09-11 MED FILL — BENZONATATE 100 MG CAPSULE: 100 | 5 days supply | Qty: 30 | Fill #0

## 2016-09-11 NOTE — Progress Notes (Signed)
We are sorry that you are not feeling well.  Here is how we plan to help!  Based on what you have shared with me it looks like you have upper respiratory tract inflammation that has resulted in a significant cough.  Inflammation and infection in the upper respiratory tract is commonly called bronchitis and has four common causes:  Allergies, Viral Infections, Acid Reflux and Bacterial Infections.  Allergies, viruses and acid reflux are treated by controlling symptoms or eliminating the cause. An example might be a cough caused by taking certain blood pressure medications. You stop the cough by changing the medication. Another example might be a cough caused by acid reflux. Controlling the reflux helps control the cough.  Based on your presentation I believe you most likely have A cough due to bacteria.  When patients have a fever and a productive cough with a change in color or increased sputum production, we are concerned about bacterial bronchitis.  If left untreated it can progress to pneumonia.  If your symptoms do not improve with your treatment plan it is important that you contact your provider.   I have prescribed Azithromyin 250 mg: two tables now and then one tablet daily for 4 additonal days    In addition you may use A non-prescription cough medication called Mucinex DM: take 2 tablets every 12 hours. and A prescription cough medication called Tessalon Perles 100mg. You may take 1-2 capsules every 8 hours as needed for your cough.  Sterapred 5 mg dosepak  USE OF BRONCHODILATOR ("RESCUE") INHALERS: There is a risk from using your bronchodilator too frequently.  The risk is that over-reliance on a medication which only relaxes the muscles surrounding the breathing tubes can reduce the effectiveness of medications prescribed to reduce swelling and congestion of the tubes themselves.  Although you feel brief relief from the bronchodilator inhaler, your asthma may actually be worsening with the  tubes becoming more swollen and filled with mucus.  This can delay other crucial treatments, such as oral steroid medications. If you need to use a bronchodilator inhaler daily, several times per day, you should discuss this with your provider.  There are probably better treatments that could be used to keep your asthma under control.     HOME CARE . Only take medications as instructed by your medical team. . Complete the entire course of an antibiotic. . Drink plenty of fluids and get plenty of rest. . Avoid close contacts especially the very young and the elderly . Cover your mouth if you cough or cough into your sleeve. . Always remember to wash your hands . A steam or ultrasonic humidifier can help congestion.   GET HELP RIGHT AWAY IF: . You develop worsening fever. . You become short of breath . You cough up blood. . Your symptoms persist after you have completed your treatment plan MAKE SURE YOU   Understand these instructions.  Will watch your condition.  Will get help right away if you are not doing well or get worse.  Your e-visit answers were reviewed by a board certified advanced clinical practitioner to complete your personal care plan.  Depending on the condition, your plan could have included both over the counter or prescription medications. If there is a problem please reply  once you have received a response from your provider. Your safety is important to us.  If you have drug allergies check your prescription carefully.    You can use MyChart to ask questions about today's   visit, request a non-urgent call back, or ask for a work or school excuse for 24 hours related to this e-Visit. If it has been greater than 24 hours you will need to follow up with your provider, or enter a new e-Visit to address those concerns. You will get an e-mail in the next two days asking about your experience.  I hope that your e-visit has been valuable and will speed your recovery. Thank you  for using e-visits.   

## 2016-09-15 ENCOUNTER — Ambulatory Visit (INDEPENDENT_AMBULATORY_CARE_PROVIDER_SITE_OTHER): Payer: 59 | Admitting: Neurology

## 2016-09-15 ENCOUNTER — Encounter: Payer: Self-pay | Admitting: Neurology

## 2016-09-15 VITALS — BP 124/92 | HR 81 | Ht 63.0 in | Wt 163.2 lb

## 2016-09-15 DIAGNOSIS — G43009 Migraine without aura, not intractable, without status migrainosus: Secondary | ICD-10-CM | POA: Diagnosis not present

## 2016-09-15 MED ORDER — ELETRIPTAN HYDROBROMIDE 20 MG PO TABS: 20.0000 mg | ORAL_TABLET | Freq: Once | ORAL | 11 refills | Status: DC

## 2016-09-15 MED ORDER — TOPIRAMATE 25 MG PO TABS
25.0000 mg | ORAL_TABLET | Freq: Two times a day (BID) | ORAL | 3 refills | Status: DC
Start: 2016-09-15 — End: 2017-03-15

## 2016-09-15 NOTE — Progress Notes (Addendum)
[facility] [address] [place] [address] [phone]       OFFICE FOLLOW UP VISIT NOTE  Kaitlin. Kaitlin Foster Date of Birth:  01-07-1981 Medical Record Number:  [id number]   Referring MD:  Self  Reason for Referral:  Headaches  HPI: Kaitlin Foster is a 47 year pleasant Caucasian lady who has had chronic headaches since teenage years. 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 She underwent initial embolization 2 followed by craniotomy and excision of the AVM by Dr. Kalman Shan at [facility]. I reviewed her records in Care everywhere. She states the headaches change character after this. She has been having left frontal and temporal headaches once a week or so. She describes this as moderate in intensity 8/10 throbbing at times and pressure-like on other occasions. These last for several hours. Most of the time she doesn't take medications but when it becomes intolerable she takes migraine Excedrin which works fairly well. She has not been to urgent care or emergency room with these headaches. There are no apparent triggers for the headaches but they seem to be made worse by allergies, dehydration, stress and lack of sleep. She does get nausea and has light sensitivity and feels like lying down with the headaches. She has occasional visual spots with her headaches but no focal neurological symptoms. She does have some residual numbness on the left side of the face and mild facial weakness as a result of the surgery. She had follow-up catheter angiogram done in 2009 which showed that the AVM had not recurred but since then she's had no brain imaging studies or vascular imaging studies done. She does complain of tightness and pain in the back of her neck and muscle pulling sensation as well. She does take diltiazem for hypertension. She had tried propranolol for  migraine prophylaxis in the past but it did not help her. She denies drinking excessive caffeine. Update 09/17/2015 :  She  returns for follow-up after last visit 2 months ago. She states her headaches are doing much better since she has started Topamax. She has had only one migraine in that tooth when she had a stomach bug. She declined and Excedrin which helped. She's had a total of only the minor headaches for the last 2 months. She is tolerating Topamax 25 twice daily fairly well but does have some mild tingling of her fingers and toes which seems to be improving. She's also been very aggressive with treating her allergies which also may have helped. 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 No evidence of recurrent AVM or new intracranial abnormality. Update 09/15/2016 : She returns for follow-up after last visit a year ago. She continues to have. She did try tapering the Topamax but found that the headaches were more frequent in and she is back on Topamax but taking it only once a day and tolerating it well without side effects. She continues to use Relpax on once or twice a month with good symptomatic relief. Her headaches continue but to be triggered by allergies and change in weather. She has no new neurological complaints. She has not had any new medical problems either.   ROS:   14 system review of systems is positive for headache,  allergies and all other systems negative PMH:  Past Medical History:  Diagnosis Date  . AVM (arteriovenous malformation)    leaky  . Hypertension   . Migraines     Social History:  Social History   Social History  . Marital status: Married    Spouse name: N/A  . Number of children: N/A  . Years of education: N/A   Occupational History  . Not on file.   Social History Main Topics  . Smoking status: Never Smoker  .  Smokeless tobacco: Never Used  . Alcohol use No  . Drug use: No  . Sexual activity: Yes    Birth control/ protection: Condom   Other Topics Concern  . 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 azelastine (ASTELIN) 0.1 % nasal spray Place 1 spray into both nostrils daily. Use in each nostril as directed 30 mL 12  . azelastine (OPTIVAR) 0.05 % ophthalmic solution 1 drop 2 (two) times daily. 6 mL 12  . azithromycin (ZITHROMAX) 250 MG tablet Take 2 tabs now then 1 daily times 4 days 6 tablet 0  . benzonatate (TESSALON PERLES) 100 MG capsule Take 1-2 capsules (100-200 mg total) by mouth every 8 (eight) hours as needed. 30 capsule 0  . CARTIA XT 120 MG 24 hr capsule TAKE 1 CAPSULE BY MOUTH ONCE DAILY 90 capsule 1  . clotrimazole (LOTRIMIN AF) 1 % cream Apply 1 application topically 2 (two) times daily. 30 g 0  . cyclobenzaprine (FLEXERIL) 10 MG tablet Take 1 tablet (10 mg total) by mouth 3 (three) times daily as needed for muscle spasms. 30 tablet 0  . etodolac (LODINE) 300 MG capsule Take 1 capsule (300 mg total) by mouth every 8 (eight) hours. (Patient taking differently: Take 300 mg by mouth every 8 (eight) hours as needed. ) 60 capsule 1  . Ferrous Fumarate-Folic Acid (HEMOCYTE-F) 324-1 MG TABS Take 1 tablet by mouth daily. 30 each 2  . fluticasone (FLONASE) 50 MCG/ACT nasal spray 2 sprays into each nostril daily (Patient taking differently: Place 2 sprays into both nostrils daily as needed. 2 sprays into each nostril daily) 16 g 2  . levocetirizine (XYZAL) 5 MG tablet Take 1 tablet (5 mg total) by mouth every evening.    . montelukast (SINGULAIR) 10 MG tablet Take 1 tablet (10 mg total) by mouth at bedtime. 30 tablet 5   No current  facility-administered medications on file prior to visit.     Allergies:   Allergies  Allergen Reactions  . Codeine     REACTION: upset stomach once, but has taken it other times and been ok  . Lisinopril     cough  . Sulfonamide Derivatives Hives    Physical Exam General: well developed, well nourished  young Caucasian lady, seated, in no evident distress Head: head normocephalic and atraumatic.   Neck: supple with no carotid or supraclavicular bruits Cardiovascular: regular rate and rhythm, no murmurs Musculoskeletal: Mild left frontal and temporal bony deformity at surgical site Skin:  no rash/petichiae Vascular:  Normal pulses all extremities  Neurologic Exam Mental Status: Awake and fully alert.  Oriented to place and time. Recent and remote memory intact. Attention span, concentration and fund of knowledge appropriate. Mood and affect appropriate.  Cranial Nerves: Fundoscopic exam not done   Pupils equal, briskly reactive to light. Extraocular movements full without nystagmus. Visual fields full to confrontation. Hearing intact. Mild left lower facial asymmetry. Facial sensation intact. Face, tongue, palate moves normally and symmetrically.  Motor: Normal bulk and tone. Normal strength in all tested extremity muscles. Sensory.: intact to touch , pinprick , position and vibratory sensation.  Coordination: Rapid alternating movements normal in all extremities. Finger-to-nose and heel-to-shin performed accurately bilaterally. Gait and Station: Arises from chair without difficulty. Stance is normal. Gait demonstrates normal stride length and balance . Able to heel, toe and tandem walk without difficulty.  Reflexes: 1+ and symmetric. Toes downgoing.       ASSESSMENT: 72 year Caucasian lady with left frontotemporal chronic headaches likely mixed migraines with tension headache features following treatment for left frontal AVM in 2008    PLAN: I had a long discussion the patient  with regards to migraine headaches which seem to be quite responsive to Relpax and well controlled on Topamax. I recommend she continue the present medication regimen. She was given a refill upon request for a year. She will learn to continue to avoid migraine triggers and return for follow-up in a year or call earlier if necessary.  Kaitlin Contras, MD Note: This document was prepared with digital dictation and possible smart phrase technology. Any transcriptional errors that result from this process are unintentional.

## 2016-09-18 MED FILL — HEMATINIC-FOLIC ACID TABLET: 324-1 | 30 days supply | Qty: 30 | Fill #2

## 2016-10-22 MED FILL — ELETRIPTAN HBR 20 MG TABLET: 20 | 90 days supply | Qty: 27 | Fill #0

## 2016-10-22 MED FILL — TOPIRAMATE 25 MG TABLET: 25 | 90 days supply | Qty: 180 | Fill #0

## 2016-10-22 MED FILL — AZELASTINE HCL 137 MCG SPRY: 0.1 | 90 days supply | Qty: 90 | Fill #1

## 2016-10-22 MED FILL — CARTIA XT 120 MG CAPSULE SA: 120 | 90 days supply | Qty: 90 | Fill #1

## 2016-10-27 ENCOUNTER — Telehealth: Payer: Self-pay | Admitting: Family

## 2016-10-27 MED ORDER — FERROUS FUMARATE-FOLIC ACID 324-1 MG PO TABS: 1.0000 | ORAL_TABLET | Freq: Every day | ORAL | 5 refills | Status: DC

## 2016-10-27 MED FILL — LEVOCETIRIZINE 5 MG TABLET: 5 | 90 days supply | Qty: 90 | Fill #0

## 2016-10-27 MED FILL — HEMATINIC-FOLIC ACID TABLET: 324-1 | 30 days supply | Qty: 30 | Fill #0

## 2016-10-27 NOTE — Telephone Encounter (Signed)
Refill sent, notified pt. 

## 2016-10-27 NOTE — Telephone Encounter (Signed)
Patient called requesting refill for Ferrous Fumarate-Folic Acid (HEMOCYTE-F) 324-1 MG TABS she uses [facility]     Patient refill [phone]

## 2016-10-29 ENCOUNTER — Other Ambulatory Visit: Payer: Self-pay | Admitting: Family

## 2016-10-29 MED ORDER — MONTELUKAST SODIUM 10 MG PO TABS: 10.0000 mg | ORAL_TABLET | Freq: Every day | ORAL | 0 refills | Status: DC

## 2016-10-29 MED FILL — MONTELUKAST SOD 10 MG TAB: 10 | 90 days supply | Qty: 90 | Fill #0

## 2016-10-29 NOTE — Telephone Encounter (Addendum)
Relation to [id number] Call back number:[phone] Pharmacy: [place], [place] - [address] [phone] (Phone) [phone] (Fax)  Reason for call:  Patient requesting a 90 day supply of montelukast (SINGULAIR) 10 MG tablet

## 2016-10-29 NOTE — Telephone Encounter (Signed)
Rx sent in to pharmacy. Patient notified.

## 2016-12-08 MED FILL — HEMATINIC-FOLIC ACID TABLET: 324-1 | 90 days supply | Qty: 90 | Fill #1

## 2016-12-29 ENCOUNTER — Encounter: Payer: Self-pay | Admitting: Family

## 2016-12-29 ENCOUNTER — Ambulatory Visit (INDEPENDENT_AMBULATORY_CARE_PROVIDER_SITE_OTHER): Payer: 59 | Admitting: Family

## 2016-12-29 DIAGNOSIS — I10 Essential (primary) hypertension: Secondary | ICD-10-CM | POA: Diagnosis not present

## 2016-12-29 DIAGNOSIS — F32A Depression, unspecified: Secondary | ICD-10-CM

## 2016-12-29 DIAGNOSIS — F418 Other specified anxiety disorders: Secondary | ICD-10-CM | POA: Diagnosis not present

## 2016-12-29 DIAGNOSIS — G43009 Migraine without aura, not intractable, without status migrainosus: Secondary | ICD-10-CM

## 2016-12-29 DIAGNOSIS — J309 Allergic rhinitis, unspecified: Secondary | ICD-10-CM

## 2016-12-29 DIAGNOSIS — F419 Anxiety disorder, unspecified: Secondary | ICD-10-CM

## 2016-12-29 DIAGNOSIS — F329 Major depressive disorder, single episode, unspecified: Secondary | ICD-10-CM

## 2016-12-29 MED ORDER — DILTIAZEM HCL ER COATED BEADS 120 MG PO CP24: 120.0000 mg | ORAL_CAPSULE | Freq: Every day | ORAL | 1 refills | Status: DC

## 2016-12-29 MED ORDER — ESCITALOPRAM OXALATE 10 MG PO TABS: ORAL_TABLET | ORAL | 0 refills | Status: DC

## 2016-12-29 MED FILL — ESCITALOPRAM 10 MG TABLET: 10 | 30 days supply | Qty: 30 | Fill #0

## 2016-12-29 NOTE — Progress Notes (Signed)
Pre visit review using our clinic review tool, if applicable. No additional management support is needed unless otherwise documented below in the visit note. 

## 2016-12-29 NOTE — Progress Notes (Signed)
Subjective:    Patient ID: [name], female    DOB: 09/21/1981, 36 y.o.   MRN: [id number]  HPI  Ms. Streit is a 36 yr old female who presents today for follow up.    HTN- maintained on cartia XT.  BP Readings from Last 3 Encounters:  12/29/16 128/85  09/15/16 (!) 124/92  06/26/16 118/80   Migraines- maintained on topamax, she continues to follow with neurology (Dr. Leonie Man).   Allergic rhinitis- maintained on singulair and xyzal PRN- not needing currently, just using claritin.  Reports that her symptoms are well controlled    Anxiety/depression- reports that she has had increased stress at work and worry about her son who was diagnosed with ADHD. Not sleeping well.    Review of Systems    see HPI  Past Medical History:  Diagnosis Date  . AVM (arteriovenous malformation)    leaky  . Hypertension   . Migraines      Social History   Social History  . Marital status: Married    Spouse name: N/A  . Number of children: N/A  . Years of education: N/A   Occupational History  . Not on file.   Social History Main Topics  . Smoking status: Never Smoker  . Smokeless tobacco: Never Used  . Alcohol use No  . Drug use: No  . Sexual activity: Yes    Birth control/ protection: Condom   Other Topics Concern  . 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 CRANIOTOMY  2008   left frontal AVM  . emobilization      Family History  Problem Relation Age of Onset  . Hypertension Father   . Alcohol abuse Father   . Arthritis Father   . Hypertension Mother   . Arthritis Mother   . Migraines Mother   . Migraines Sister   . Migraines Maternal Aunt   . Cancer Neg Hx   . Heart disease Neg Hx     Allergies  Allergen Reactions  . Codeine     REACTION: upset  stomach once, but has taken it other times and been ok  . Lisinopril     cough  . Sulfonamide Derivatives Hives    Current Outpatient Prescriptions on File Prior to Visit  Medication Sig Dispense Refill  . azelastine (ASTELIN) 0.1 % nasal spray Place 1 spray into both nostrils daily. Use in each nostril as directed 30 mL 12  . azelastine (OPTIVAR) 0.05 % ophthalmic solution 1 drop 2 (two) times daily. 6 mL 12  . CARTIA XT 120 MG 24 hr capsule TAKE 1 CAPSULE BY MOUTH ONCE DAILY 90 capsule 1  . cyclobenzaprine (FLEXERIL) 10 MG tablet Take 1 tablet (10 mg total) by mouth 3 (three) times daily as needed for muscle spasms. 30 tablet 0  . etodolac (LODINE) 300 MG capsule Take 1 capsule (300 mg total) by mouth every 8 (eight) hours. (Patient taking differently: Take 300 mg by mouth every 8 (eight) hours as needed. ) 60 capsule 1  . Ferrous Fumarate-Folic Acid (HEMOCYTE-F) 324-1 MG TABS Take 1 tablet by mouth daily. 30 each 5  . fluticasone (FLONASE) 50 MCG/ACT nasal  spray 2 sprays into each nostril daily (Patient taking differently: Place 2 sprays into both nostrils daily as needed. 2 sprays into each nostril daily) 16 g 2  . levocetirizine (XYZAL) 5 MG tablet Take 1 tablet (5 mg total) by mouth every evening.    . montelukast (SINGULAIR) 10 MG tablet Take 1 tablet (10 mg total) by mouth at bedtime. 90 tablet 0  . topiramate (TOPAMAX) 25 MG tablet Take 1 tablet (25 mg total) by mouth 2 (two) times daily. 180 tablet 3  . eletriptan (RELPAX) 20 MG tablet Take 1 tablet (20 mg total) by mouth once. May repeat in 2 hours if headache persists or recurs. 9 tablet 11   No current facility-administered medications on file prior to visit.     BP 128/85 (BP Location: Left Arm, Cuff Size: Normal)   Pulse 78   Temp 98.7 F (37.1 C) (Oral)   Ht 5\' 3"  (1.6 m)   Wt 165 lb 12.8 oz (75.2 kg)   LMP 12/08/2016   SpO2 100%   BMI 29.37 kg/m    Objective:   Physical Exam  Constitutional: She is oriented to  person, place, and time. She appears well-developed and well-nourished.  Cardiovascular: Normal rate, regular rhythm and normal heart sounds.   No murmur heard. Pulmonary/Chest: Effort normal and breath sounds normal. No respiratory distress. She has no wheezes.  Neurological: She is alert and oriented to person, place, and time. No cranial nerve deficit. She exhibits normal muscle tone. Coordination normal.  Psychiatric: She has a normal mood and affect. Her behavior is normal. Judgment and thought content normal.          Assessment & Plan:

## 2016-12-29 NOTE — Assessment & Plan Note (Signed)
Stable, management per neurology. 

## 2016-12-29 NOTE — Assessment & Plan Note (Signed)
Stable on diltiazem, continue same.

## 2016-12-29 NOTE — Patient Instructions (Addendum)
Begin lexapro 10mg , start 1/2 tab once daily for 1 week, then increase to a full tab once daily on week two.

## 2016-12-29 NOTE — Assessment & Plan Note (Signed)
Stable, monitor.  °

## 2016-12-29 NOTE — Assessment & Plan Note (Signed)
Depression is uncontrolled.  She scored 10 on PHQ-9.  She is interested in trying medication to see if it will help her.  Will give trial of lexapro.

## 2017-01-26 ENCOUNTER — Encounter: Payer: Self-pay | Admitting: Family

## 2017-01-26 ENCOUNTER — Ambulatory Visit (INDEPENDENT_AMBULATORY_CARE_PROVIDER_SITE_OTHER): Payer: 59 | Admitting: Family

## 2017-01-26 DIAGNOSIS — F32A Depression, unspecified: Secondary | ICD-10-CM

## 2017-01-26 DIAGNOSIS — F418 Other specified anxiety disorders: Secondary | ICD-10-CM

## 2017-01-26 DIAGNOSIS — F419 Anxiety disorder, unspecified: Principal | ICD-10-CM

## 2017-01-26 DIAGNOSIS — F329 Major depressive disorder, single episode, unspecified: Secondary | ICD-10-CM

## 2017-01-26 MED ORDER — ESCITALOPRAM OXALATE 10 MG PO TABS: 10.0000 mg | ORAL_TABLET | Freq: Every day | ORAL | 0 refills | Status: DC

## 2017-01-26 MED FILL — ESCITALOPRAM 10 MG TABLET: 10 | 90 days supply | Qty: 90 | Fill #0

## 2017-01-26 NOTE — Assessment & Plan Note (Signed)
Improved. Continue lexapro

## 2017-01-26 NOTE — Progress Notes (Signed)
Subjective:    Patient ID: Kaitlin Foster, female    DOB: 1981-04-28, 36 y.o.   MRN: [id number]  HPI  Depression- last visit she was placed on lexapro. Feels like she can let go of things easier.  Still has trouble falling asleep.  Drinks caffeine in the AM, very little in the afternoon. + screen.  No side effects.  Overall mood is improved.  Wt Readings from Last 3 Encounters:  01/26/17 166 lb 3.2 oz (75.4 kg)  12/29/16 165 lb 12.8 oz (75.2 kg)  09/15/16 163 lb 3.2 oz (74 kg)      Review of Systems See HPI  Past Medical History:  Diagnosis Date  . AVM (arteriovenous malformation)    leaky  . Hypertension   . Migraines      Social History   Social History  . Marital status: Married    Spouse name: N/A  . Number of children: N/A  . Years of education: N/A   Occupational History  . Not on file.   Social History Main Topics  . Smoking status: Never Smoker  . Smokeless tobacco: Never Used  . Alcohol use No  . Drug use: No  . Sexual activity: Yes    Birth control/ protection: Condom   Other Topics Concern  . 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 CRANIOTOMY  2008   left frontal AVM  . emobilization      Family History  Problem Relation Age of Onset  . Hypertension Father   . Alcohol abuse Father   . Arthritis Father   . Hypertension Mother   . Arthritis Mother   . Migraines Mother   . Migraines Sister   . Migraines Maternal Aunt   . Cancer Neg Hx   . Heart disease Neg Hx     Allergies  Allergen Reactions  . Codeine     REACTION: upset stomach once, but has taken it other times and been ok  . Lisinopril     cough  . Sulfonamide Derivatives Hives    Current Outpatient Prescriptions on File Prior to Visit  Medication Sig Dispense Refill   . azelastine (ASTELIN) 0.1 % nasal spray Place 1 spray into both nostrils daily. Use in each nostril as directed 30 mL 12  . azelastine (OPTIVAR) 0.05 % ophthalmic solution 1 drop 2 (two) times daily. 6 mL 12  . cyclobenzaprine (FLEXERIL) 10 MG tablet Take 1 tablet (10 mg total) by mouth 3 (three) times daily as needed for muscle spasms. 30 tablet 0  . diltiazem (CARTIA XT) 120 MG 24 hr capsule Take 1 capsule (120 mg total) by mouth daily. 90 capsule 1  . escitalopram (LEXAPRO) 10 MG tablet 1/2 tab once daily for 1 week, then increase to a full tab once daily 30 tablet 0  . etodolac (LODINE) 300 MG capsule Take 1 capsule (300 mg total) by mouth every 8 (eight) hours. (Patient taking differently: Take 300 mg by mouth every 8 (eight) hours as needed. ) 60 capsule 1  . Ferrous Fumarate-Folic Acid (HEMOCYTE-F) 324-1 MG TABS Take 1 tablet by mouth daily. 30 each 5  . fluticasone (FLONASE) 50 MCG/ACT nasal  spray 2 sprays into each nostril daily (Patient taking differently: Place 2 sprays into both nostrils daily as needed. 2 sprays into each nostril daily) 16 g 2  . levocetirizine (XYZAL) 5 MG tablet Take 1 tablet (5 mg total) by mouth every evening.    . montelukast (SINGULAIR) 10 MG tablet Take 1 tablet (10 mg total) by mouth at bedtime. 90 tablet 0  . topiramate (TOPAMAX) 25 MG tablet Take 1 tablet (25 mg total) by mouth 2 (two) times daily. 180 tablet 3  . eletriptan (RELPAX) 20 MG tablet Take 1 tablet (20 mg total) by mouth once. May repeat in 2 hours if headache persists or recurs. 9 tablet 11   No current facility-administered medications on file prior to visit.     BP 121/84 (BP Location: Left Arm, Patient Position: Sitting, Cuff Size: Normal)   Pulse 73   Temp 98.8 F (37.1 C) (Oral)   Resp 16   Ht 5\' 3"  (1.6 m)   Wt 166 lb 3.2 oz (75.4 kg)   LMP 01/12/2017   SpO2 100%   BMI 29.44 kg/m       Objective:   Physical Exam  Constitutional: She is oriented to person, place, and time.  She appears well-developed and well-nourished.  HENT:  Head: Normocephalic and atraumatic.  Cardiovascular: Normal rate, regular rhythm and normal heart sounds.   No murmur heard. Pulmonary/Chest: Effort normal and breath sounds normal. No respiratory distress. She has no wheezes.  Neurological: She is alert and oriented to person, place, and time.  Psychiatric: She has a normal mood and affect. Her behavior is normal. Judgment and thought content normal.          Assessment & Plan:

## 2017-01-26 NOTE — Patient Instructions (Signed)
Continue lexapro  ?

## 2017-01-26 NOTE — Progress Notes (Signed)
Pre visit review using our clinic review tool, if applicable. No additional management support is needed unless otherwise documented below in the visit note. 

## 2017-01-29 MED FILL — CARTIA XT 120 MG CAPSULE SA: 120 | 90 days supply | Qty: 90 | Fill #0

## 2017-03-15 ENCOUNTER — Ambulatory Visit (INDEPENDENT_AMBULATORY_CARE_PROVIDER_SITE_OTHER): Payer: 59 | Admitting: Family Medicine

## 2017-03-15 ENCOUNTER — Encounter: Payer: Self-pay | Admitting: Family Medicine

## 2017-03-15 VITALS — BP 116/80 | HR 88 | Temp 98.3°F | Ht 63.0 in | Wt 176.8 lb

## 2017-03-15 DIAGNOSIS — R109 Unspecified abdominal pain: Secondary | ICD-10-CM

## 2017-03-15 MED ORDER — NAPROXEN 500 MG PO TBEC: 500.0000 mg | DELAYED_RELEASE_TABLET | Freq: Two times a day (BID) | ORAL | 0 refills | Status: DC

## 2017-03-15 MED ORDER — HYDROCODONE-ACETAMINOPHEN 5-325 MG PO TABS: 1.0000 | ORAL_TABLET | Freq: Four times a day (QID) | ORAL | 0 refills | Status: DC | PRN

## 2017-03-15 MED FILL — HYDROCODON-APAP 5-325: 5-325 | 3 days supply | Qty: 12 | Fill #0

## 2017-03-15 MED FILL — NAPROXEN DR 500 MG TABLET: 500 | 15 days supply | Qty: 30 | Fill #0

## 2017-03-15 NOTE — Progress Notes (Signed)
Pre visit review using our clinic review tool, if applicable. No additional management support is needed unless otherwise documented below in the visit note. 

## 2017-03-15 NOTE — Patient Instructions (Signed)
If you change your mind about the X-ray, MyChart message me and I will order it. I think it would be low yield at this time.  Take the Naproxen scheduled for the next 3-4 days to get ahead of the inflammation. Don't take ibuprofen while on naproxen.  Ice/cold pack over area for 10-15 min every 2-3 hours while awake.  Heat (pad or rice pillow in microwave) over affected area, 10-15 minutes every 2-3 hours while awake.
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
## 2017-03-26 ENCOUNTER — Other Ambulatory Visit: Payer: Self-pay | Admitting: Family

## 2017-03-26 MED FILL — HEMATINIC-FOLIC ACID TABLET: 324-1 | 60 days supply | Qty: 60 | Fill #2

## 2017-03-26 MED FILL — LEVOCETIRIZINE 5 MG TABLET: 5 | 90 days supply | Qty: 90 | Fill #1

## 2017-04-09 ENCOUNTER — Encounter: Payer: Self-pay | Admitting: Family

## 2017-04-22 MED FILL — MONTELUKAST SOD 10 MG TAB: 10 | 90 days supply | Qty: 90 | Fill #0

## 2017-04-22 MED FILL — CARTIA XT 120 MG CAPSULE SA: 120 | 90 days supply | Qty: 90 | Fill #1

## 2017-06-11 ENCOUNTER — Other Ambulatory Visit: Payer: Self-pay | Admitting: Family

## 2017-06-22 ENCOUNTER — Encounter (INDEPENDENT_AMBULATORY_CARE_PROVIDER_SITE_OTHER): Payer: 59

## 2017-06-23 ENCOUNTER — Ambulatory Visit (INDEPENDENT_AMBULATORY_CARE_PROVIDER_SITE_OTHER): Payer: 59 | Admitting: Family

## 2017-06-23 ENCOUNTER — Encounter: Payer: 59 | Admitting: Family

## 2017-06-23 ENCOUNTER — Encounter: Payer: Self-pay | Admitting: Family

## 2017-06-23 VITALS — BP 125/80 | HR 80 | Temp 98.3°F | Resp 18 | Ht 63.0 in | Wt 185.0 lb

## 2017-06-23 DIAGNOSIS — Z0000 Encounter for general adult medical examination without abnormal findings: Secondary | ICD-10-CM

## 2017-06-23 DIAGNOSIS — E611 Iron deficiency: Secondary | ICD-10-CM

## 2017-06-23 LAB — URINALYSIS, ROUTINE W REFLEX MICROSCOPIC
Bilirubin Urine: NEGATIVE
HGB URINE DIPSTICK: NEGATIVE
Ketones, ur: NEGATIVE
Leukocytes, UA: NEGATIVE
Nitrite: NEGATIVE
RBC / HPF: NONE SEEN (ref 0–?)
Specific Gravity, Urine: 1.015 (ref 1.000–1.030)
Total Protein, Urine: NEGATIVE
Urine Glucose: NEGATIVE
Urobilinogen, UA: 0.2 (ref 0.0–1.0)
pH: 8.5 — AB (ref 5.0–8.0)

## 2017-06-23 LAB — IRON: IRON: 75 ug/dL (ref 42–145)

## 2017-06-23 LAB — CBC WITH DIFFERENTIAL/PLATELET
BASOS PCT: 0.5 % (ref 0.0–3.0)
Basophils Absolute: 0 10*3/uL (ref 0.0–0.1)
Eosinophils Absolute: 0.1 10*3/uL (ref 0.0–0.7)
Eosinophils Relative: 1.9 % (ref 0.0–5.0)
HCT: 46.1 % — ABNORMAL HIGH (ref 36.0–46.0)
Hemoglobin: 15.4 g/dL — ABNORMAL HIGH (ref 12.0–15.0)
Lymphocytes Relative: 24.9 % (ref 12.0–46.0)
Lymphs Abs: 1.5 10*3/uL (ref 0.7–4.0)
MCHC: 33.4 g/dL (ref 30.0–36.0)
MCV: 92.2 fl (ref 78.0–100.0)
MONO ABS: 0.5 10*3/uL (ref 0.1–1.0)
Monocytes Relative: 8.5 % (ref 3.0–12.0)
NEUTROS ABS: 3.8 10*3/uL (ref 1.4–7.7)
Neutrophils Relative %: 64.2 % (ref 43.0–77.0)
PLATELETS: 251 10*3/uL (ref 150.0–400.0)
RBC: 5 Mil/uL (ref 3.87–5.11)
RDW: 12.8 % (ref 11.5–15.5)
WBC: 5.9 10*3/uL (ref 4.0–10.5)

## 2017-06-23 LAB — HEPATIC FUNCTION PANEL
ALT: 15 U/L (ref 0–35)
AST: 20 U/L (ref 0–37)
Albumin: 4.4 g/dL (ref 3.5–5.2)
Alkaline Phosphatase: 75 U/L (ref 39–117)
BILIRUBIN TOTAL: 0.5 mg/dL (ref 0.2–1.2)
Bilirubin, Direct: 0.2 mg/dL (ref 0.0–0.3)
Total Protein: 6.5 g/dL (ref 6.0–8.3)

## 2017-06-23 LAB — LIPID PANEL
CHOL/HDL RATIO: 4
Cholesterol: 140 mg/dL (ref 0–200)
HDL: 32.1 mg/dL — AB (ref >39.00)
LDL CALC: 86 mg/dL (ref 0–99)
NonHDL: 107.72
TRIGLYCERIDES: 111 mg/dL (ref 0.0–149.0)
VLDL: 22.2 mg/dL (ref 0.0–40.0)

## 2017-06-23 LAB — BASIC METABOLIC PANEL
BUN: 15 mg/dL (ref 6–23)
CALCIUM: 9.4 mg/dL (ref 8.4–10.5)
CHLORIDE: 103 meq/L (ref 96–112)
CO2: 32 meq/L (ref 19–32)
CREATININE: 0.7 mg/dL (ref 0.40–1.20)
GFR: 101.4 mL/min (ref >60.00)
Glucose, Bld: 93 mg/dL (ref 70–99)
Potassium: 4.2 mEq/L (ref 3.5–5.1)
SODIUM: 138 meq/L (ref 135–145)

## 2017-06-23 LAB — TSH: TSH: 1.08 u[IU]/mL (ref 0.35–4.50)

## 2017-06-23 NOTE — Patient Instructions (Addendum)
Please complete lab work prior to leaving. Work on [employer] regular exercise and weight loss.   Back Exercises The following exercises strengthen the muscles that help to support the back. They also help to keep the lower back flexible. Doing these exercises can help to prevent back pain or lessen existing pain. If you have back pain or discomfort, try doing these exercises 2-3 times each day or as told by your health care provider. When the pain goes away, do them once each day, but increase the number of times that you repeat the steps for each exercise (do more repetitions). If you do not have back pain or discomfort, do these exercises once each day or as told by your health care provider. Exercises Single Knee to Chest  Repeat these steps 3-5 times for each leg: 1. Lie on your back on a firm bed or the floor with your legs extended. 2. Bring one knee to your chest. Your other leg should stay extended and in contact with the floor. 3. Hold your knee in place by grabbing your knee or thigh. 4. Pull on your knee until you feel a gentle stretch in your lower back. 5. Hold the stretch for 10-30 seconds. 6. Slowly release and straighten your leg.  Pelvic Tilt  Repeat these steps 5-10 times: 1. Lie on your back on a firm bed or the floor with your legs extended. 2. Bend your knees so they are pointing toward the ceiling and your feet are flat on the floor. 3. Tighten your lower abdominal muscles to press your lower back against the floor. This motion will tilt your pelvis so your tailbone points up toward the ceiling instead of pointing to your feet or the floor. 4. With gentle tension and even breathing, hold this position for 5-10 seconds.  Cat-Cow  Repeat these steps until your lower back becomes more flexible: 1. Get into a hands-and-knees position on a firm surface. Keep your hands under your shoulders, and keep your knees under your hips. You may place padding under your knees for  comfort. 2. Let your head hang down, and point your tailbone toward the floor so your lower back becomes rounded like the back of a cat. 3. Hold this position for 5 seconds. 4. Slowly lift your head and point your tailbone up toward the ceiling so your back forms a sagging arch like the back of a cow. 5. Hold this position for 5 seconds.  Press-Ups  Repeat these steps 5-10 times: 1. Lie on your abdomen (face-down) on the floor. 2. Place your palms near your head, about shoulder-width apart. 3. While you keep your back as relaxed as possible and keep your hips on the floor, slowly straighten your arms to raise the top half of your body and lift your shoulders. Do not use your back muscles to raise your upper torso. You may adjust the placement of your hands to make yourself more comfortable. 4. Hold this position for 5 seconds while you keep your back relaxed. 5. Slowly return to lying flat on the floor.  Bridges  Repeat these steps 10 times: 1. Lie on your back on a firm surface. 2. Bend your knees so they are pointing toward the ceiling and your feet are flat on the floor. 3. Tighten your buttocks muscles and lift your buttocks off of the floor until your waist is at almost the same height as your knees. You should feel the muscles working in your buttocks and the  back of your thighs. If you do not feel these muscles, slide your feet 1-2 inches farther away from your buttocks. 4. Hold this position for 3-5 seconds. 5. Slowly lower your hips to the starting position, and allow your buttocks muscles to relax completely.  If this exercise is too easy, try doing it with your arms crossed over your chest. Abdominal Crunches  Repeat these steps 5-10 times: 1. Lie on your back on a firm bed or the floor with your legs extended. 2. Bend your knees so they are pointing toward the ceiling and your feet are flat on the floor. 3. Cross your arms over your chest. 4. Tip your chin slightly toward  your chest without bending your neck. 5. Tighten your abdominal muscles and slowly raise your trunk (torso) high enough to lift your shoulder blades a tiny bit off of the floor. Avoid raising your torso higher than that, because it can put too much stress on your low back and it does not help to strengthen your abdominal muscles. 6. Slowly return to your starting position.  Back Lifts Repeat these steps 5-10 times: 1. Lie on your abdomen (face-down) with your arms at your sides, and rest your forehead on the floor. 2. Tighten the muscles in your legs and your buttocks. 3. Slowly lift your chest off of the floor while you keep your hips pressed to the floor. Keep the back of your head in line with the curve in your back. Your eyes should be looking at the floor. 4. Hold this position for 3-5 seconds. 5. Slowly return to your starting position.  Contact a health care provider if:  Your back pain or discomfort gets much worse when you do an exercise.  Your back pain or discomfort does not lessen within 2 hours after you exercise. If you have any of these problems, stop doing these exercises right away. Do not do them again unless your health care provider says that you can. Get help right away if:  You develop sudden, severe back pain. If this happens, stop doing the exercises right away. Do not do them again unless your health care provider says that you can. This information is not intended to replace advice given to you by your health care provider. Make sure you discuss any questions you have with your health care provider. Document Released: 12/03/2004 Document Revised: 03/04/2016 Document Reviewed: 12/20/2014 Elsevier Interactive Patient Education  2017 [employer].
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
## 2017-06-24 ENCOUNTER — Telehealth: Payer: Self-pay | Admitting: Family

## 2017-06-24 NOTE — Telephone Encounter (Signed)
See mychart.  

## 2017-06-30 ENCOUNTER — Encounter: Payer: Self-pay | Admitting: Family

## 2017-07-01 ENCOUNTER — Encounter (INDEPENDENT_AMBULATORY_CARE_PROVIDER_SITE_OTHER): Payer: Self-pay | Admitting: Family Medicine

## 2017-07-01 ENCOUNTER — Ambulatory Visit (INDEPENDENT_AMBULATORY_CARE_PROVIDER_SITE_OTHER): Payer: 59 | Admitting: Family Medicine

## 2017-07-01 VITALS — BP 130/81 | HR 74 | Temp 98.1°F | Ht 64.0 in | Wt 182.0 lb

## 2017-07-01 DIAGNOSIS — E669 Obesity, unspecified: Secondary | ICD-10-CM

## 2017-07-01 DIAGNOSIS — Z1389 Encounter for screening for other disorder: Secondary | ICD-10-CM

## 2017-07-01 DIAGNOSIS — R0602 Shortness of breath: Secondary | ICD-10-CM

## 2017-07-01 DIAGNOSIS — Z0000 Encounter for general adult medical examination without abnormal findings: Secondary | ICD-10-CM | POA: Insufficient documentation

## 2017-07-01 DIAGNOSIS — R5383 Other fatigue: Secondary | ICD-10-CM | POA: Diagnosis not present

## 2017-07-01 DIAGNOSIS — Z1331 Encounter for screening for depression: Secondary | ICD-10-CM

## 2017-07-01 DIAGNOSIS — Z9189 Other specified personal risk factors, not elsewhere classified: Secondary | ICD-10-CM | POA: Diagnosis not present

## 2017-07-01 DIAGNOSIS — E784 Other hyperlipidemia: Secondary | ICD-10-CM

## 2017-07-01 DIAGNOSIS — Z0289 Encounter for other administrative examinations: Secondary | ICD-10-CM

## 2017-07-01 DIAGNOSIS — E7849 Other hyperlipidemia: Secondary | ICD-10-CM | POA: Insufficient documentation

## 2017-07-01 DIAGNOSIS — I10 Essential (primary) hypertension: Secondary | ICD-10-CM

## 2017-07-01 DIAGNOSIS — Z6831 Body mass index (BMI) 31.0-31.9, adult: Secondary | ICD-10-CM | POA: Diagnosis not present

## 2017-07-01 NOTE — Progress Notes (Signed)
Office: [phone]  /  Fax: [phone]   Dear [name], NP,   Thank you for referring [name] to our clinic. The following note includes my evaluation and treatment recommendations.  HPI:   Chief Complaint: OBESITY    Napoleon has been referred by [name], NP for consultation regarding her obesity and obesity related comorbidities.    Kaitlin Foster (MR# [id number]) is a 36 y.o. female who presents on 07/01/2017 for obesity evaluation and treatment. Current BMI is Body mass index is 31.24 kg/m.Kaitlin Foster has been struggling with her weight for many years and has been unsuccessful in either losing weight, maintaining weight loss, or reaching her healthy weight goal.     Museum/gallery conservator attended our information session and states she is currently in the action stage of change and ready to dedicate time achieving and maintaining a healthier weight. Kaitlin Foster is interested in becoming our patient and working on intensive lifestyle modifications including (but not limited to) diet, exercise and weight loss.    Kaitlin Foster states her family eats meals together she thinks her family will eat healthier with  her her desired weight loss is 46 lbs she has been heavy most of  her life she started gaining weight after having child her heaviest weight ever was 180+ lbs. she has significant food cravings issues  she snacks frequently in the evenings she frequently makes poor food choices she frequently eats larger portions than normal  she has binge eating behaviors she struggles with emotional eating    Fatigue Tomoko feels her energy is lower than it should be. This has worsened with weight gain and has not worsened recently. Jonella admits to daytime somnolence and admits to waking up still tired. Patient is at risk for obstructive sleep apnea. Patent has a history of symptoms of daytime fatigue and morning fatigue. Patient generally gets 7 hours of sleep per night, and states they  generally have restless sleep. Snoring is not present. Apneic episodes are not present. Epworth Sleepiness Score is 4  Dyspnea on exertion Tunisha notes increasing shortness of breath with exercising and seems to be worsening over time with weight gain. She notes getting out of breath sooner with activity than she used to. This has not gotten worse recently. Kiele denies orthopnea.  Hyperlipidemia Kaydan has hyperlipidemia and is not currently on a statin. She would like to improve her cholesterol levels with intensive lifestyle modification including a low saturated fat diet, exercise and weight loss. She denies any chest pain, claudication or myalgias.  Hypertension Kaitlin Foster is a 36 y.o. female with hypertension. She is currently on diltiazem and her blood pressure is stable. [name] [employer] denies chest pain or headache. She is attempting weight loss to help control her blood pressure with the goal of decreasing her risk of heart attack and stroke. Ambers blood pressure is currently controlled.  At risk for cardiovascular disease [name] is at a higher than average risk for cardiovascular disease due to obesity, hyperlipidemia and hypertension. She currently denies any chest pain.   Depression Screen Kaitlin Foster's Food and Mood (modified PHQ-9) score was  Depression screen PHQ 2/9 07/01/2017  Decreased Interest 0  Down, Depressed, Hopeless 1  PHQ - 2 Score 1  Altered sleeping 3  Tired, decreased energy 2  Change in appetite 3  Feeling bad or failure about yourself  0  Trouble concentrating 2  Moving slowly or fidgety/restless 0  Suicidal thoughts 0  PHQ-9 Score 11  Difficult doing work/chores Not difficult at all    ALLERGIES: Allergies  Allergen Reactions   Codeine     REACTION: upset stomach once, but has taken it other times and been ok   Lisinopril     cough   Sulfonamide Derivatives Hives    MEDICATIONS: Current Outpatient Prescriptions on File Prior to Visit    Medication Sig Dispense Refill   diltiazem (CARTIA XT) 120 MG 24 hr capsule Take 1 capsule (120 mg total) by mouth daily. 90 capsule 1   levocetirizine (XYZAL) 5 MG tablet Take 1 tablet (5 mg total) by mouth every evening.     montelukast (SINGULAIR) 10 MG tablet TAKE 1 TABLET BY MOUTH ONCE DAILY AT BEDTIME 90 tablet 0   naproxen (NAPROXEN DR) 500 MG EC tablet Take 1 tablet (500 mg total) by mouth 2 (two) times daily with a meal. 30 tablet 0   azelastine (ASTELIN) 0.1 % nasal spray Place 1 spray into both nostrils daily. Use in each nostril as directed (Patient not taking: Reported on 07/01/2017) 30 mL 12   azelastine (OPTIVAR) 0.05 % ophthalmic solution 1 drop 2 (two) times daily as needed.  6 mL 12   eletriptan (RELPAX) 20 MG tablet Take 1 tablet (20 mg total) by mouth once. May repeat in 2 hours if headache persists or recurs. 9 tablet 11   fluticasone (FLONASE) 50 MCG/ACT nasal spray 2 sprays into each nostril daily (Patient not taking: Reported on 07/01/2017) 16 g 2   HYDROcodone-acetaminophen (NORCO/VICODIN) 5-325 MG tablet Take 1 tablet by mouth every 6 (six) hours as needed for moderate pain. (Patient not taking: Reported on 07/01/2017) 12 tablet 0   ibuprofen (ADVIL,MOTRIN) 600 MG tablet Take 600 mg by mouth every 6 (six) hours as needed.     No current facility-administered medications on file prior to visit.     PAST MEDICAL HISTORY: Past Medical History:  Diagnosis Date   Anxiety    AVM (arteriovenous malformation)    leaky   Back pain    Hypertension    Joint pain    Migraines     PAST SURGICAL HISTORY: Past Surgical History:  Procedure Laterality Date   CESAREAN SECTION     CRANIOTOMY  2008   left frontal AVM   emobilization      SOCIAL HISTORY: Social History  Substance Use Topics   Smoking status: Never Smoker   Smokeless tobacco: Never Used   Alcohol use No    FAMILY HISTORY: Family History  Problem Relation Age of Onset    Hypertension Father    Alcohol abuse Father    Arthritis Father    Obesity Father    Hypertension Mother    Arthritis Mother    Migraines Mother    Obesity Mother    Migraines Sister    Ulcerative colitis Sister    Migraines Maternal Aunt    ADD / ADHD Son    Cancer Neg Hx    Heart disease Neg Hx     ROS: Review of Systems  Constitutional: Positive for malaise/fatigue.  Eyes:       Wear Glasses or Contacts  Respiratory: Positive for shortness of breath (on exertion).   Cardiovascular: Negative for chest pain, orthopnea and claudication.  Musculoskeletal: Positive for back pain. Negative for myalgias.       Muscle or Joint Pain Muscle Stiffness  Neurological: Positive for headaches.  Psychiatric/Behavioral:       Stress    PHYSICAL EXAM: Blood pressure  130/81, pulse 74, temperature 98.1 F (36.7 C), temperature source Oral, height 5\' 4"  (1.626 m), weight 182 lb (82.6 kg), last menstrual period 06/05/2017, SpO2 100 %. Body mass index is 31.24 kg/m. Physical Exam  Constitutional: She is oriented to person, place, and time. She appears well-developed and well-nourished.  Cardiovascular: Normal rate.   Pulmonary/Chest: Effort normal.  Musculoskeletal: Normal range of motion.  Neurological: She is oriented to person, place, and time.  Skin: Skin is warm and dry.  Psychiatric: She has a normal mood and affect. Her behavior is normal.  Vitals reviewed.   RECENT LABS AND TESTS: BMET    Component Value Date/Time   NA 138 06/23/2017 0952   K 4.2 06/23/2017 0952   CL 103 06/23/2017 0952   CO2 32 06/23/2017 0952   GLUCOSE 93 06/23/2017 0952   BUN 15 06/23/2017 0952   CREATININE 0.70 06/23/2017 0952   CREATININE 0.72 03/20/2014 0935   CALCIUM 9.4 06/23/2017 0952   GFRNONAA >89 03/20/2014 0935   GFRAA >89 03/20/2014 0935   No results found for: HGBA1C No results found for: INSULIN CBC    Component Value Date/Time   WBC 5.9 06/23/2017 0952   RBC 5.00  06/23/2017 0952   HGB 15.4 (H) 06/23/2017 0952   HCT 46.1 (H) 06/23/2017 0952   PLT 251.0 06/23/2017 0952   MCV 92.2 06/23/2017 0952   MCH 28.2 06/20/2014 1653   MCHC 33.4 06/23/2017 0952   RDW 12.8 06/23/2017 0952   LYMPHSABS 1.5 06/23/2017 0952   MONOABS 0.5 06/23/2017 0952   EOSABS 0.1 06/23/2017 0952   BASOSABS 0.0 06/23/2017 0952   Iron/TIBC/Ferritin/ %Sat    Component Value Date/Time   IRON 75 06/23/2017 0952   FERRITIN 6.6 (L) 06/30/2016 0720   Lipid Panel     Component Value Date/Time   CHOL 140 06/23/2017 0952   TRIG 111.0 06/23/2017 0952   HDL 32.10 (L) 06/23/2017 0952   CHOLHDL 4 06/23/2017 0952   VLDL 22.2 06/23/2017 0952   LDLCALC 86 06/23/2017 0952   Hepatic Function Panel     Component Value Date/Time   PROT 6.5 06/23/2017 0952   ALBUMIN 4.4 06/23/2017 0952   AST 20 06/23/2017 0952   ALT 15 06/23/2017 0952   ALKPHOS 75 06/23/2017 0952   BILITOT 0.5 06/23/2017 0952   BILIDIR 0.2 06/23/2017 0952   IBILI 0.3 03/20/2014 0935      Component Value Date/Time   TSH 1.08 06/23/2017 0952   TSH 1.95 06/26/2016 0752   TSH 2.39 06/25/2015 0740    ECG  shows NSR with a rate of 82 BPM INDIRECT CALORIMETER done today shows a VO2 of 254 and a REE of 1767.  Her calculated basal metabolic rate is 2956 thus her basal metabolic rate is better than expected.    ASSESSMENT AND PLAN: Other fatigue - Plan: EKG 12-Lead, Hemoglobin A1c, Insulin, random, VITAMIN D 25 Hydroxy (Vit-D Deficiency, Fractures), Vitamin B12, Folate  Shortness of breath on exertion  Depression screening  Other hyperlipidemia  Essential hypertension  At risk for heart disease  Class 1 obesity with serious comorbidity and body mass index (BMI) of 31.0 to 31.9 in adult, unspecified obesity type  PLAN: Fatigue Rose was informed that her fatigue may be related to obesity, depression or many other causes. Labs will be ordered, and in the meanwhile Deijah has agreed to work on diet, exercise  and weight loss to help with fatigue. Proper sleep hygiene was discussed including the need for 7-8  hours of quality sleep each night. A sleep study was not ordered based on symptoms and Epworth score.  Dyspnea on exertion Leani's shortness of breath appears to be obesity related and exercise induced. She has agreed to work on weight loss and gradually increase exercise to treat her exercise induced shortness of breath. If Lylah follows our instructions and loses weight without improvement of her shortness of breath, we will plan to refer to pulmonology. We will monitor this condition regularly. Corine agrees to this plan.  Hyperlipidemia Aimee was informed of the American Heart Association Guidelines emphasizing intensive lifestyle modifications as the first line treatment for hyperlipidemia. We discussed many lifestyle modifications today in depth, and Tanyla will continue to work on decreasing saturated fats such as fatty red meat, butter and many fried foods. She will also increase vegetables and lean protein in her diet and continue to work on exercise and weight loss efforts.  Hypertension We discussed sodium restriction, working on healthy weight loss, and a regular exercise program as the means to achieve improved blood pressure control. Javier agreed with this plan and agreed to follow up as directed. We will continue to monitor her blood pressure as well as her progress with the above lifestyle modifications. She will continue her medications as prescribed and will watch for signs of hypotension as she continues her lifestyle modifications. We will check labs and Zarahi agrees to work on diet, exercise and weight loss with the goal to control hypertension without medications.   Cardiovascular risk counselling Crysta was given extended (15 minutes) coronary artery disease prevention counseling today. She is 36 y.o. female and has risk factors for heart disease including obesity, hyperlipidemia and  hypertension. We discussed intensive lifestyle modifications today with an emphasis on specific weight loss instructions and strategies. Pt was also informed of the importance of increasing exercise and decreasing saturated fats to help prevent heart disease.  Depression Screen Arisbeth had a moderately positive depression screening. Depression is commonly associated with obesity and often results in emotional eating behaviors. We will monitor this closely and work on CBT to help improve the non-hunger eating patterns. Referral to Psychology may be required if no improvement is seen as she continues in our clinic.  Obesity Jannah is currently in the action stage of change and her goal is to continue with weight loss efforts. I recommend Danecia begin the structured treatment plan as follows:  She has agreed to follow the Category 2 plan +100 calories Kiyah has been instructed to eventually work up to a goal of 150 minutes of combined cardio and strengthening exercise per week for weight loss and overall health benefits. We discussed the following Behavioral Modification Strategies today: meal planning & cooking strategies, increasing lean protein intake, decreasing simple carbohydrates  and dealing with family or coworker sabotage   She was informed of the importance of frequent follow up visits to maximize her success with intensive lifestyle modifications for her multiple health conditions. She was informed we would discuss her lab results at her next visit unless there is a critical issue that needs to be addressed sooner. Nikiyah agreed to keep her next visit at the agreed upon time to discuss these results.  I, Doreene Nest, am acting as transcriptionist for Dennard Nip, MD  I have reviewed the above documentation for accuracy and completeness, and I agree with the above. -Dennard Nip, MD   OBESITY BEHAVIORAL INTERVENTION VISIT  Today's visit was # 1 out of 70.  Starting weight: 182  lbs Starting date: 07/01/17 Today's weight : 182 lbs Today's date: 07/01/2017 Total lbs lost to date: 0 (Patients must lose 7 lbs in the first 6 months to continue with counseling)   ASK: We discussed the diagnosis of obesity with [name] today and Suly agreed to give [place] permission to discuss obesity behavioral modification therapy today.  ASSESS: Gelila has the diagnosis of obesity and her BMI today is 31.22 Sansa is in the action stage of change   ADVISE: Almas was educated on the multiple health risks of obesity as well as the benefit of weight loss to improve her health. She was advised of the need for long term treatment and the importance of lifestyle modifications.  AGREE: Multiple dietary modification options and treatment options were discussed and  Brihanna agreed to follow the Category 2 plan +100 calories We discussed the following Behavioral Modification Strategies today: meal planning & cooking strategies, increasing lean protein intake, decreasing simple carbohydrates  and dealing with family or coworker sabotage

## 2017-07-02 LAB — FOLATE

## 2017-07-02 LAB — VITAMIN D 25 HYDROXY (VIT D DEFICIENCY, FRACTURES): VIT D 25 HYDROXY: 32.7 ng/mL (ref 30.0–100.0)

## 2017-07-02 LAB — VITAMIN B12: VITAMIN B 12: 267 pg/mL (ref 232–1245)

## 2017-07-02 LAB — INSULIN, RANDOM: INSULIN: 11 u[IU]/mL (ref 2.6–24.9)

## 2017-07-02 LAB — HEMOGLOBIN A1C
Est. average glucose Bld gHb Est-mCnc: 97 mg/dL
HEMOGLOBIN A1C: 5 % (ref 4.8–5.6)

## 2017-07-05 ENCOUNTER — Encounter: Payer: 59 | Admitting: Family

## 2017-07-07 ENCOUNTER — Encounter (INDEPENDENT_AMBULATORY_CARE_PROVIDER_SITE_OTHER): Payer: Self-pay | Admitting: Family Medicine

## 2017-07-15 ENCOUNTER — Ambulatory Visit (INDEPENDENT_AMBULATORY_CARE_PROVIDER_SITE_OTHER): Payer: 59 | Admitting: Family Medicine

## 2017-07-15 VITALS — BP 124/82 | HR 75 | Temp 97.7°F | Ht 64.0 in | Wt 176.0 lb

## 2017-07-15 DIAGNOSIS — E669 Obesity, unspecified: Secondary | ICD-10-CM | POA: Diagnosis not present

## 2017-07-15 DIAGNOSIS — E559 Vitamin D deficiency, unspecified: Secondary | ICD-10-CM

## 2017-07-15 DIAGNOSIS — Z9189 Other specified personal risk factors, not elsewhere classified: Secondary | ICD-10-CM

## 2017-07-15 DIAGNOSIS — E8881 Metabolic syndrome: Secondary | ICD-10-CM

## 2017-07-15 DIAGNOSIS — Z6830 Body mass index (BMI) 30.0-30.9, adult: Secondary | ICD-10-CM

## 2017-07-15 NOTE — Progress Notes (Signed)
Office: [phone]  /  Fax: [phone]   HPI:   Chief Complaint: OBESITY Kaitlin Foster is here to discuss her progress with her obesity treatment plan. She is on the Category 2 plan +100 calories and is following her eating plan approximately 90 % of the time. She states she is exercising 0 minutes 0 times per week. Kaitlin Foster  Did well with her diet prescription. Hunger is improved after the first 3 to 4 days. She didn't like all the protein in the pm. Her weight is 176 lb (79.8 kg) today and has had a weight loss of 6 pounds over a period of 2 weeks since her last visit. She has lost 6 lbs since starting treatment with [place].  Vitamin D deficiency Kaitlin Foster has a new diagnosis of vitamin D deficiency. She is not currently taking vit D and notes mild fatigue but denies nausea, vomiting or muscle weakness.  Insulin Resistance Kaitlin Foster has a new diagnosis of insulin resistance based on her elevated fasting insulin level at 11. Although Kaitlin Foster's blood glucose readings are still under good control, insulin resistance puts her at greater risk of metabolic syndrome and diabetes. She is not taking metformin currently and continues to work on diet and exercise to decrease risk of diabetes. She admits polyphagia but this is improved on diet.  At risk for diabetes Kaitlin Foster is at higher than average risk for developing diabetes due to her obesity and insulin resistance. She currently denies polyuria or polydipsia.   ALLERGIES: Allergies  Allergen Reactions   Codeine     REACTION: upset stomach once, but has taken it other times and been ok   Lisinopril     cough   Sulfonamide Derivatives Hives    MEDICATIONS: Current Outpatient Prescriptions on File Prior to Visit  Medication Sig Dispense Refill   Aspirin-Acetaminophen-Caffeine (EXCEDRIN MIGRAINE PO) Take 2 tablets by mouth 2 (two) times daily as needed.     azelastine (ASTELIN) 0.1 % nasal spray Place 1 spray into both nostrils daily. Use in each nostril  as directed 30 mL 12   azelastine (OPTIVAR) 0.05 % ophthalmic solution 1 drop 2 (two) times daily as needed.  6 mL 12   diltiazem (CARTIA XT) 120 MG 24 hr capsule Take 1 capsule (120 mg total) by mouth daily. 90 capsule 1   fluticasone (FLONASE) 50 MCG/ACT nasal spray 2 sprays into each nostril daily 16 g 2   HYDROcodone-acetaminophen (NORCO/VICODIN) 5-325 MG tablet Take 1 tablet by mouth every 6 (six) hours as needed for moderate pain. 12 tablet 0   ibuprofen (ADVIL,MOTRIN) 600 MG tablet Take 600 mg by mouth every 6 (six) hours as needed.     levocetirizine (XYZAL) 5 MG tablet Take 1 tablet (5 mg total) by mouth every evening.     montelukast (SINGULAIR) 10 MG tablet TAKE 1 TABLET BY MOUTH ONCE DAILY AT BEDTIME 90 tablet 0   naproxen (NAPROXEN DR) 500 MG EC tablet Take 1 tablet (500 mg total) by mouth 2 (two) times daily with a meal. 30 tablet 0   eletriptan (RELPAX) 20 MG tablet Take 1 tablet (20 mg total) by mouth once. May repeat in 2 hours if headache persists or recurs. 9 tablet 11   No current facility-administered medications on file prior to visit.     PAST MEDICAL HISTORY: Past Medical History:  Diagnosis Date   Anxiety    AVM (arteriovenous malformation)    leaky   Back pain    Hypertension    Joint  pain    Migraines     PAST SURGICAL HISTORY: Past Surgical History:  Procedure Laterality Date   CESAREAN SECTION     CRANIOTOMY  2008   left frontal AVM   emobilization      SOCIAL HISTORY: Social History  Substance Use Topics   Smoking status: Never Smoker   Smokeless tobacco: Never Used   Alcohol use No    FAMILY HISTORY: Family History  Problem Relation Age of Onset   Hypertension Father    Alcohol abuse Father    Arthritis Father    Obesity Father    Hypertension Mother    Arthritis Mother    Migraines Mother    Obesity Mother    Migraines Sister    Ulcerative colitis Sister    Migraines Maternal Aunt    ADD / ADHD  Son    Cancer Neg Hx    Heart disease Neg Hx     ROS: Review of Systems  Constitutional: Positive for malaise/fatigue and weight loss.  Gastrointestinal: Negative for nausea and vomiting.  Genitourinary: Negative for frequency.  Musculoskeletal:       Negative muscle weakness  Endo/Heme/Allergies: Negative for polydipsia.    PHYSICAL EXAM: Blood pressure 124/82, pulse 75, temperature 97.7 F (36.5 C), temperature source Oral, height 5\' 4"  (1.626 m), weight 176 lb (79.8 kg), last menstrual period 07/07/2017, SpO2 98 %. Body mass index is 30.21 kg/m. Physical Exam  Constitutional: She is oriented to person, place, and time. She appears well-developed and well-nourished.  Cardiovascular: Normal rate.   Pulmonary/Chest: Effort normal.  Musculoskeletal: Normal range of motion.  Neurological: She is oriented to person, place, and time.  Skin: Skin is warm and dry.  Psychiatric: She has a normal mood and affect. Her behavior is normal.  Vitals reviewed.   RECENT LABS AND TESTS: BMET    Component Value Date/Time   NA 138 06/23/2017 0952   K 4.2 06/23/2017 0952   CL 103 06/23/2017 0952   CO2 32 06/23/2017 0952   GLUCOSE 93 06/23/2017 0952   BUN 15 06/23/2017 0952   CREATININE 0.70 06/23/2017 0952   CREATININE 0.72 03/20/2014 0935   CALCIUM 9.4 06/23/2017 0952   GFRNONAA >89 03/20/2014 0935   GFRAA >89 03/20/2014 0935   Lab Results  Component Value Date   HGBA1C 5.0 07/01/2017   Lab Results  Component Value Date   INSULIN 11.0 07/01/2017   CBC    Component Value Date/Time   WBC 5.9 06/23/2017 0952   RBC 5.00 06/23/2017 0952   HGB 15.4 (H) 06/23/2017 0952   HCT 46.1 (H) 06/23/2017 0952   PLT 251.0 06/23/2017 0952   MCV 92.2 06/23/2017 0952   MCH 28.2 06/20/2014 1653   MCHC 33.4 06/23/2017 0952   RDW 12.8 06/23/2017 0952   LYMPHSABS 1.5 06/23/2017 0952   MONOABS 0.5 06/23/2017 0952   EOSABS 0.1 06/23/2017 0952   BASOSABS 0.0 06/23/2017 0952    Iron/TIBC/Ferritin/ %Sat    Component Value Date/Time   IRON 75 06/23/2017 0952   FERRITIN 6.6 (L) 06/30/2016 0720   Lipid Panel     Component Value Date/Time   CHOL 140 06/23/2017 0952   TRIG 111.0 06/23/2017 0952   HDL 32.10 (L) 06/23/2017 0952   CHOLHDL 4 06/23/2017 0952   VLDL 22.2 06/23/2017 0952   LDLCALC 86 06/23/2017 0952   Hepatic Function Panel     Component Value Date/Time   PROT 6.5 06/23/2017 0952   ALBUMIN 4.4 06/23/2017 6759  AST 20 06/23/2017 0952   ALT 15 06/23/2017 0952   ALKPHOS 75 06/23/2017 0952   BILITOT 0.5 06/23/2017 0952   BILIDIR 0.2 06/23/2017 0952   IBILI 0.3 03/20/2014 0935      Component Value Date/Time   TSH 1.08 06/23/2017 0952   TSH 1.95 06/26/2016 0752   TSH 2.39 06/25/2015 0740    ASSESSMENT AND PLAN: Vitamin D deficiency  Insulin resistance  At risk for diabetes mellitus  Class 1 obesity with serious comorbidity and body mass index (BMI) of 30.0 to 30.9 in adult, unspecified obesity type  PLAN:  Vitamin D Deficiency Cieanna was informed that low vitamin D levels contributes to fatigue and are associated with obesity, breast, and colon cancer. She agrees to continue to take prescription Vit D @50 ,000 IU every week #4 with no refills and will follow up for routine testing of vitamin D, at least 2-3 times per year. She was informed of the risk of over-replacement of vitamin D and agrees to not increase her dose unless he discusses this with [place] first. Kaitlin Foster agrees to follow up with our clinic in 2 weeks.  Insulin Resistance Kaitlin Foster will continue to work on weight loss, exercise, and decreasing simple carbohydrates in her diet to help decrease the risk of diabetes. We dicussed metformin including benefits and risks. She was informed that eating too many simple carbohydrates or too many calories at one sitting increases the likelihood of GI side effects. Kaitlin Foster declined metformin for now and prescription was not written today. Kaitlin Foster  agreed to follow up with [place] as directed to monitor her progress.  Diabetes risk counseling Kaitlin Foster was given extended (15 minutes) diabetes prevention counseling today. She is 36 y.o. female and has risk factors for diabetes including obesity and insulin resistance. We discussed intensive lifestyle modifications today with an emphasis on weight loss as well as increasing exercise and decreasing simple carbohydrates in her diet.  Obesity Kaitlin Foster is currently in the action stage of change. As such, her goal is to continue with weight loss efforts She has agreed to follow the Category 2 plan +100 calories Kaitlin Foster has been instructed to work up to a goal of 150 minutes of combined cardio and strengthening exercise per week or start couch to 5k for weight loss and overall health benefits. We discussed the following Behavioral Modification Strategies today: keeping healthy foods in the home, increasing lean protein intake, decreasing simple carbohydrates  and work on meal planning and easy cooking plans  Kaitlin Foster has agreed to follow up with our clinic in 2 weeks. She was informed of the importance of frequent follow up visits to maximize her success with intensive lifestyle modifications for her multiple health conditions.  I, Doreene Nest, am acting as transcriptionist for Dennard Nip, MD  I have reviewed the above documentation for accuracy and completeness, and I agree with the above. -Dennard Nip, MD    OBESITY BEHAVIORAL INTERVENTION VISIT  Today's visit was # 2 out of 46.  Starting weight: 182 lbs Starting date: 07/01/17 Today's weight : 176 lbs Today's date: 07/15/2017 Total lbs lost to date: 6 (Patients must lose 7 lbs in the first 6 months to continue with counseling)   ASK: We discussed the diagnosis of obesity with Kaitlin Foster today and Kaitlin Foster agreed to give [place] permission to discuss obesity behavioral modification therapy today.  ASSESS: Kaitlin Foster has the diagnosis of obesity and her  BMI today is 30.2 Kaitlin Foster is in the action stage of change  ADVISE: Kaitlin Foster was educated on the multiple health risks of obesity as well as the benefit of weight loss to improve her health. She was advised of the need for long term treatment and the importance of lifestyle modifications.  AGREE: Multiple dietary modification options and treatment options were discussed and  Kaitlin Foster agreed to follow the Category 2 plan  +100 calories We discussed the following Behavioral Modification Strategies today: keeping healthy foods in the home, increasing lean protein intake, decreasing simple carbohydrates  and work on meal planning and easy cooking plans

## 2017-07-16 ENCOUNTER — Encounter (INDEPENDENT_AMBULATORY_CARE_PROVIDER_SITE_OTHER): Payer: Self-pay | Admitting: Family Medicine

## 2017-07-19 ENCOUNTER — Other Ambulatory Visit (INDEPENDENT_AMBULATORY_CARE_PROVIDER_SITE_OTHER): Payer: Self-pay

## 2017-07-19 DIAGNOSIS — E559 Vitamin D deficiency, unspecified: Secondary | ICD-10-CM

## 2017-07-19 MED ORDER — VITAMIN D (ERGOCALCIFEROL) 1.25 MG (50000 UNIT) PO CAPS: 50000.0000 [IU] | ORAL_CAPSULE | ORAL | 0 refills | Status: DC

## 2017-07-19 MED FILL — VIT D2 1.25 MG (50,000 UNIT: 1.25 MG | 28 days supply | Qty: 4 | Fill #0

## 2017-07-20 ENCOUNTER — Other Ambulatory Visit: Payer: Self-pay | Admitting: Family

## 2017-07-21 ENCOUNTER — Other Ambulatory Visit: Payer: Self-pay | Admitting: Family

## 2017-07-21 MED ORDER — MONTELUKAST SODIUM 10 MG PO TABS
10.0000 mg | ORAL_TABLET | Freq: Every day | ORAL | 1 refills | Status: DC
Start: 2017-07-21 — End: 2018-02-09

## 2017-07-21 MED FILL — CARTIA XT 120 MG CAPSULE SA: 120 | 90 days supply | Qty: 90 | Fill #0

## 2017-07-22 MED FILL — MONTELUKAST SOD 10 MG TAB: 10 | 90 days supply | Qty: 90 | Fill #0

## 2017-07-28 ENCOUNTER — Ambulatory Visit: Payer: 59 | Admitting: Family

## 2017-07-29 ENCOUNTER — Other Ambulatory Visit: Payer: Self-pay

## 2017-07-29 ENCOUNTER — Other Ambulatory Visit: Payer: Self-pay | Admitting: Neurology

## 2017-07-29 ENCOUNTER — Telehealth: Payer: Self-pay | Admitting: Neurology

## 2017-07-29 ENCOUNTER — Ambulatory Visit (INDEPENDENT_AMBULATORY_CARE_PROVIDER_SITE_OTHER): Payer: 59 | Admitting: Physician Assistant

## 2017-07-29 VITALS — BP 126/86 | HR 78 | Temp 98.0°F | Ht 64.0 in | Wt 172.0 lb

## 2017-07-29 DIAGNOSIS — Z6830 Body mass index (BMI) 30.0-30.9, adult: Secondary | ICD-10-CM

## 2017-07-29 DIAGNOSIS — G43009 Migraine without aura, not intractable, without status migrainosus: Secondary | ICD-10-CM

## 2017-07-29 DIAGNOSIS — E559 Vitamin D deficiency, unspecified: Secondary | ICD-10-CM | POA: Diagnosis not present

## 2017-07-29 DIAGNOSIS — E669 Obesity, unspecified: Secondary | ICD-10-CM | POA: Diagnosis not present

## 2017-07-29 MED ORDER — RIZATRIPTAN BENZOATE 5 MG PO TABS: 5.0000 mg | ORAL_TABLET | ORAL | 1 refills | Status: DC | PRN

## 2017-07-29 MED ORDER — RIZATRIPTAN BENZOATE 5 MG PO TABS: 5.0000 mg | ORAL_TABLET | ORAL | 0 refills | Status: DC | PRN

## 2017-07-29 NOTE — Telephone Encounter (Signed)
Ok will change yo maxalt 5 mg

## 2017-07-29 NOTE — Telephone Encounter (Signed)
Receive message that pts maxalt insurance will over 18 pills, not 10 that was prescribed. Medication quantity change to 18. Rn call [facility] and they stated medication was approve, and did not require a PA.

## 2017-07-29 NOTE — Telephone Encounter (Signed)
Rn call [facility] about pt stating relpax needs PA. Alvie Heidelberg stated with the new insurance plan that pt has she needs to try imitrex, or maxalt first before replax can be order again. Pt was prescribed it last year but the insurance did not require a PA, and it was cover. Message will be sent to Dr. Leonie Man.

## 2017-07-29 NOTE — Telephone Encounter (Signed)
Dr.Sethi please prescribed imitrex, or maxalt for patient. Her insurance will no longer cover relpax until these two have been tried by patient. Its due to her insurance plan she has now.

## 2017-07-29 NOTE — Telephone Encounter (Signed)
Pt request refill for eletriptan (RELPAX) 20 MG tablet(Expired) sent to [facility]. She said this requires PA.

## 2017-08-02 NOTE — Progress Notes (Signed)
Office: [phone]  /  Fax: [phone]   HPI:   Chief Complaint: OBESITY Kaitlin Foster is here to discuss her progress with her obesity treatment plan. She is on the Category 2 plan +100 calories and is following her eating plan approximately 90 % of the time. She states she is exercising 0 minutes 0 times per week. Kaitlin Foster continues to do well with weight loss. She plans her meals well and states hunger is well controlled. Her weight is 172 lb (78 kg) today and has had a weight loss of 4 pounds over a period of 2 weeks since her last visit. She has lost 10 lbs since starting treatment with [place].  Vitamin D deficiency Kaitlin Foster has a diagnosis of vitamin D deficiency. She is currently taking vit D and denies nausea, vomiting or muscle weakness.   ALLERGIES: Allergies  Allergen Reactions  . Codeine     REACTION: upset stomach once, but has taken it other times and been ok  . Lisinopril     cough  . Sulfonamide Derivatives Hives    MEDICATIONS: Current Outpatient Prescriptions on File Prior to Visit  Medication Sig Dispense Refill  . Aspirin-Acetaminophen-Caffeine (EXCEDRIN MIGRAINE PO) Take 2 tablets by mouth 2 (two) times daily as needed.    [name] azelastine (ASTELIN) 0.1 % nasal spray Place 1 spray into both nostrils daily. Use in each nostril as directed 30 mL 12  . azelastine (OPTIVAR) 0.05 % ophthalmic solution 1 drop 2 (two) times daily as needed.  6 mL 12  . CARTIA XT 120 MG 24 hr capsule TAKE 1 CAPSULE BY MOUTH DAILY. 90 capsule 1  . fluticasone (FLONASE) 50 MCG/ACT nasal spray 2 sprays into each nostril daily 16 g 2  . HYDROcodone-acetaminophen (NORCO/VICODIN) 5-325 MG tablet Take 1 tablet by mouth every 6 (six) hours as needed for moderate pain. 12 tablet 0  . ibuprofen (ADVIL,MOTRIN) 600 MG tablet Take 600 mg by mouth every 6 (six) hours as needed.    [name] levocetirizine (XYZAL) 5 MG tablet Take 1 tablet (5 mg total) by mouth every evening.    . montelukast (SINGULAIR) 10 MG tablet Take  1 tablet (10 mg total) by mouth at bedtime. 90 tablet 1  . naproxen (NAPROXEN DR) 500 MG EC tablet Take 1 tablet (500 mg total) by mouth 2 (two) times daily with a meal. 30 tablet 0  . Vitamin D, Ergocalciferol, (DRISDOL) 50000 units CAPS capsule Take 1 capsule (50,000 Units total) by mouth every 7 (seven) days. 4 capsule 0  . eletriptan (RELPAX) 20 MG tablet Take 1 tablet (20 mg total) by mouth once. May repeat in 2 hours if headache persists or recurs. 9 tablet 11   No current facility-administered medications on file prior to visit.     PAST MEDICAL HISTORY: Past Medical History:  Diagnosis Date  . Anxiety   . AVM (arteriovenous malformation)    leaky  . Back pain   . Hypertension   . Joint pain   . Migraines     PAST SURGICAL HISTORY: Past Surgical History:  Procedure Laterality Date  . CESAREAN SECTION    . CRANIOTOMY  2008   left frontal AVM  . emobilization      SOCIAL HISTORY: Social History  Substance Use Topics  . Smoking status: Never Smoker  . Smokeless tobacco: Never Used  . Alcohol use No    FAMILY HISTORY: Family History  Problem Relation Age of Onset  . Hypertension Father   . Alcohol abuse  Father   . Arthritis Father   . Obesity Father   . Hypertension Mother   . Arthritis Mother   . Migraines Mother   . Obesity Mother   . Migraines Sister   . Ulcerative colitis Sister   . Migraines Maternal Aunt   . ADD / ADHD Son   . Cancer Neg Hx   . Heart disease Neg Hx     ROS: Review of Systems  Constitutional: Positive for weight loss.  Gastrointestinal: Negative for nausea and vomiting.  Musculoskeletal:       Negative muscle weakness    PHYSICAL EXAM: Blood pressure 126/86, pulse 78, temperature 98 F (36.7 C), temperature source Oral, height 5\' 4"  (1.626 m), weight 172 lb (78 kg), last menstrual period 07/07/2017, SpO2 98 %. Body mass index is 29.52 kg/m. Physical Exam  Constitutional: She is oriented to person, place, and time. She  appears well-developed and well-nourished.  Cardiovascular: Normal rate.   Pulmonary/Chest: Effort normal.  Musculoskeletal: Normal range of motion.  Neurological: She is oriented to person, place, and time.  Skin: Skin is warm and dry.  Psychiatric: She has a normal mood and affect. Her behavior is normal.  Vitals reviewed.   RECENT LABS AND TESTS: BMET    Component Value Date/Time   NA 138 06/23/2017 0952   K 4.2 06/23/2017 0952   CL 103 06/23/2017 0952   CO2 32 06/23/2017 0952   GLUCOSE 93 06/23/2017 0952   BUN 15 06/23/2017 0952   CREATININE 0.70 06/23/2017 0952   CREATININE 0.72 03/20/2014 0935   CALCIUM 9.4 06/23/2017 0952   GFRNONAA >89 03/20/2014 0935   GFRAA >89 03/20/2014 0935   Lab Results  Component Value Date   HGBA1C 5.0 07/01/2017   Lab Results  Component Value Date   INSULIN 11.0 07/01/2017   CBC    Component Value Date/Time   WBC 5.9 06/23/2017 0952   RBC 5.00 06/23/2017 0952   HGB 15.4 (H) 06/23/2017 0952   HCT 46.1 (H) 06/23/2017 0952   PLT 251.0 06/23/2017 0952   MCV 92.2 06/23/2017 0952   MCH 28.2 06/20/2014 1653   MCHC 33.4 06/23/2017 0952   RDW 12.8 06/23/2017 0952   LYMPHSABS 1.5 06/23/2017 0952   MONOABS 0.5 06/23/2017 0952   EOSABS 0.1 06/23/2017 0952   BASOSABS 0.0 06/23/2017 0952   Iron/TIBC/Ferritin/ %Sat    Component Value Date/Time   IRON 75 06/23/2017 0952   FERRITIN 6.6 (L) 06/30/2016 0720   Lipid Panel     Component Value Date/Time   CHOL 140 06/23/2017 0952   TRIG 111.0 06/23/2017 0952   HDL 32.10 (L) 06/23/2017 0952   CHOLHDL 4 06/23/2017 0952   VLDL 22.2 06/23/2017 0952   LDLCALC 86 06/23/2017 0952   Hepatic Function Panel     Component Value Date/Time   PROT 6.5 06/23/2017 0952   ALBUMIN 4.4 06/23/2017 0952   AST 20 06/23/2017 0952   ALT 15 06/23/2017 0952   ALKPHOS 75 06/23/2017 0952   BILITOT 0.5 06/23/2017 0952   BILIDIR 0.2 06/23/2017 0952   IBILI 0.3 03/20/2014 0935      Component Value  Date/Time   TSH 1.08 06/23/2017 0952   TSH 1.95 06/26/2016 0752   TSH 2.39 06/25/2015 0740    ASSESSMENT AND PLAN: Vitamin D deficiency  Class 1 obesity with serious comorbidity and body mass index (BMI) of 30.0 to 30.9 in adult, unspecified obesity type  PLAN:  Vitamin D Deficiency Kaitlin Foster was informed that low  vitamin D levels contributes to fatigue and are associated with obesity, breast, and colon cancer. She agrees to continue to take prescription Vit D @50 ,000 IU every week and will follow up for routine testing of vitamin D, at least 2-3 times per year. She was informed of the risk of over-replacement of vitamin D and agrees to not increase her dose unless he discusses this with [place] first.  We spent > than 50% of the 15 minute visit on the counseling as documented in the note.  Obesity Kaitlin Foster is currently in the action stage of change. As such, her goal is to continue with weight loss efforts She has agreed to follow the Category 2 plan Kaitlin Foster has been instructed to work up to a goal of 150 minutes of combined cardio and strengthening exercise per week for weight loss and overall health benefits. We discussed the following Behavioral Modification Strategies today: increasing lean protein intake and work on meal planning and easy cooking plans  Kaitlin Foster has agreed to follow up with our clinic in 2 weeks. She was informed of the importance of frequent follow up visits to maximize her success with intensive lifestyle modifications for her multiple health conditions.  I, Doreene Nest, am acting as transcriptionist for Lacy Duverney, PA-C  I have reviewed the above documentation for accuracy and completeness, and I agree with the above. -Lacy Duverney, PA-C  I have reviewed the above note and agree with the plan. -Kaitlin Nip, MD   OBESITY BEHAVIORAL INTERVENTION VISIT  Today's visit was # 3 out of 22.  Starting weight: 182 lbs Starting date: 07/01/17 Today's weight : 172 lbs Today's  date: 07/29/2017 Total lbs lost to date: 10 (Patients must lose 7 lbs in the first 6 months to continue with counseling)   ASK: We discussed the diagnosis of obesity with Kaitlin Foster today and Kaitlin Foster agreed to give [place] permission to discuss obesity behavioral modification therapy today.  ASSESS: Burnadette has the diagnosis of obesity and her BMI today is 29.51 Marda is in the action stage of change   ADVISE: Maelyn was educated on the multiple health risks of obesity as well as the benefit of weight loss to improve her health. She was advised of the need for long term treatment and the importance of lifestyle modifications.  AGREE: Multiple dietary modification options and treatment options were discussed and  Erendida agreed to follow the Category 2 plan We discussed the following Behavioral Modification Strategies today: increasing lean protein intake and work on meal planning and easy cooking plans

## 2017-08-10 ENCOUNTER — Encounter (INDEPENDENT_AMBULATORY_CARE_PROVIDER_SITE_OTHER): Payer: Self-pay | Admitting: Family Medicine

## 2017-08-12 ENCOUNTER — Ambulatory Visit (INDEPENDENT_AMBULATORY_CARE_PROVIDER_SITE_OTHER): Payer: 59 | Admitting: Physician Assistant

## 2017-08-12 VITALS — BP 129/81 | HR 73 | Temp 97.9°F | Ht 64.0 in | Wt 170.0 lb

## 2017-08-12 DIAGNOSIS — E559 Vitamin D deficiency, unspecified: Secondary | ICD-10-CM

## 2017-08-12 DIAGNOSIS — E8881 Metabolic syndrome: Secondary | ICD-10-CM

## 2017-08-12 DIAGNOSIS — Z6830 Body mass index (BMI) 30.0-30.9, adult: Secondary | ICD-10-CM | POA: Diagnosis not present

## 2017-08-12 DIAGNOSIS — Z9189 Other specified personal risk factors, not elsewhere classified: Secondary | ICD-10-CM

## 2017-08-12 DIAGNOSIS — E669 Obesity, unspecified: Secondary | ICD-10-CM

## 2017-08-12 MED ORDER — VITAMIN D (ERGOCALCIFEROL) 1.25 MG (50000 UNIT) PO CAPS: 50000.0000 [IU] | ORAL_CAPSULE | ORAL | 0 refills | Status: DC

## 2017-08-12 MED FILL — VIT D2 1.25 MG (50,000 UNIT: 1.25 MG | 28 days supply | Qty: 4 | Fill #0

## 2017-08-16 MED FILL — RIZATRIPTAN 5 MG TABLET: 5 | 30 days supply | Qty: 12 | Fill #0

## 2017-08-16 NOTE — Progress Notes (Signed)
Office: [phone]  /  Fax: [phone]   HPI:   Chief Complaint: OBESITY Kaitlin Foster is here to discuss her progress with her obesity treatment plan. She is on the  follow the Category 2 plan and is following her eating plan approximately 80 % of the time. She states she is exercising 0 minutes 0 times per week. Kaitlin Foster is doing well with weight loss. She plans her meals well and would like more variety at dinner. States hunger is well controlled. Her weight is 170 lb (77.1 kg) today and has had a weight loss of 2 pounds over a period of 2 weeks since her last visit. She has lost 12 lbs since starting treatment with [place].  Vitamin D deficiency Kaitlin Foster has a diagnosis of vitamin D deficiency. She is currently taking vit D and denies nausea, vomiting or muscle weakness.  Insulin Resistance Kaitlin Foster has a diagnosis of insulin resistance based on her elevated fasting insulin level >5. Although Kaitlin Foster's blood glucose readings are still under good control, insulin resistance puts her at greater risk of metabolic syndrome and diabetes. She is not taking metformin currently and continues to work on diet and exercise to decrease risk of diabetes.   At risk for diabetes Kaitlin Foster is at higher than averagerisk for developing diabetes due to her obesity. She currently denies polyuria or polydipsia.   ALLERGIES: Allergies  Allergen Reactions  . Codeine     REACTION: upset stomach once, but has taken it other times and been ok  . Lisinopril     cough  . Sulfonamide Derivatives Hives    MEDICATIONS: Current Outpatient Prescriptions on File Prior to Visit  Medication Sig Dispense Refill  . Aspirin-Acetaminophen-Caffeine (EXCEDRIN MIGRAINE PO) Take 2 tablets by mouth 2 (two) times daily as needed.    [name] azelastine (ASTELIN) 0.1 % nasal spray Place 1 spray into both nostrils daily. Use in each nostril as directed 30 mL 12  . azelastine (OPTIVAR) 0.05 % ophthalmic solution 1 drop 2 (two) times daily as needed.  6  mL 12  . CARTIA XT 120 MG 24 hr capsule TAKE 1 CAPSULE BY MOUTH DAILY. 90 capsule 1  . fluticasone (FLONASE) 50 MCG/ACT nasal spray 2 sprays into each nostril daily 16 g 2  . HYDROcodone-acetaminophen (NORCO/VICODIN) 5-325 MG tablet Take 1 tablet by mouth every 6 (six) hours as needed for moderate pain. 12 tablet 0  . ibuprofen (ADVIL,MOTRIN) 600 MG tablet Take 600 mg by mouth every 6 (six) hours as needed.    [name] levocetirizine (XYZAL) 5 MG tablet Take 1 tablet (5 mg total) by mouth every evening.    . montelukast (SINGULAIR) 10 MG tablet Take 1 tablet (10 mg total) by mouth at bedtime. 90 tablet 1  . naproxen (NAPROXEN DR) 500 MG EC tablet Take 1 tablet (500 mg total) by mouth 2 (two) times daily with a meal. 30 tablet 0  . rizatriptan (MAXALT) 5 MG tablet Take 1 tablet (5 mg total) by mouth as needed for migraine. May repeat in 2 hours if needed 18 tablet 1  . eletriptan (RELPAX) 20 MG tablet Take 1 tablet (20 mg total) by mouth once. May repeat in 2 hours if headache persists or recurs. 9 tablet 11   No current facility-administered medications on file prior to visit.     PAST MEDICAL HISTORY: Past Medical History:  Diagnosis Date  . Anxiety   . AVM (arteriovenous malformation)    leaky  . Back pain   . Hypertension   .  Joint pain   . Migraines     PAST SURGICAL HISTORY: Past Surgical History:  Procedure Laterality Date  . CESAREAN SECTION    . CRANIOTOMY  2008   left frontal AVM  . emobilization      SOCIAL HISTORY: Social History  Substance Use Topics  . Smoking status: Never Smoker  . Smokeless tobacco: Never Used  . Alcohol use No    FAMILY HISTORY: Family History  Problem Relation Age of Onset  . Hypertension Father   . Alcohol abuse Father   . Arthritis Father   . Obesity Father   . Hypertension Mother   . Arthritis Mother   . Migraines Mother   . Obesity Mother   . Migraines Sister   . Ulcerative colitis Sister   . Migraines Maternal Aunt   . ADD /  ADHD Son   . Cancer Neg Hx   . Heart disease Neg Hx     ROS: Review of Systems  Constitutional: Positive for weight loss.  Gastrointestinal: Negative for nausea and vomiting.  Musculoskeletal:       Negative muscle weakness  Endo/Heme/Allergies:       Negative polyuria Negative polydipsia    PHYSICAL EXAM: Blood pressure 129/81, pulse 73, temperature 97.9 F (36.6 C), temperature source Oral, height 5\' 4"  (1.626 m), weight 170 lb (77.1 kg), last menstrual period 08/07/2017, SpO2 98 %. Body mass index is 29.18 kg/m. Physical Exam  Constitutional: She is oriented to person, place, and time. She appears well-developed and well-nourished.  Pulmonary/Chest: Effort normal.  Neurological: She is alert and oriented to person, place, and time.  Skin: Skin is warm and dry.  Psychiatric: She has a normal mood and affect.  Vitals reviewed.   RECENT LABS AND TESTS: BMET    Component Value Date/Time   NA 138 06/23/2017 0952   K 4.2 06/23/2017 0952   CL 103 06/23/2017 0952   CO2 32 06/23/2017 0952   GLUCOSE 93 06/23/2017 0952   BUN 15 06/23/2017 0952   CREATININE 0.70 06/23/2017 0952   CREATININE 0.72 03/20/2014 0935   CALCIUM 9.4 06/23/2017 0952   GFRNONAA >89 03/20/2014 0935   GFRAA >89 03/20/2014 0935   Lab Results  Component Value Date   HGBA1C 5.0 07/01/2017   Lab Results  Component Value Date   INSULIN 11.0 07/01/2017   CBC    Component Value Date/Time   WBC 5.9 06/23/2017 0952   RBC 5.00 06/23/2017 0952   HGB 15.4 (H) 06/23/2017 0952   HCT 46.1 (H) 06/23/2017 0952   PLT 251.0 06/23/2017 0952   MCV 92.2 06/23/2017 0952   MCH 28.2 06/20/2014 1653   MCHC 33.4 06/23/2017 0952   RDW 12.8 06/23/2017 0952   LYMPHSABS 1.5 06/23/2017 0952   MONOABS 0.5 06/23/2017 0952   EOSABS 0.1 06/23/2017 0952   BASOSABS 0.0 06/23/2017 0952   Iron/TIBC/Ferritin/ %Sat    Component Value Date/Time   IRON 75 06/23/2017 0952   FERRITIN 6.6 (L) 06/30/2016 0720   Lipid Panel       Component Value Date/Time   CHOL 140 06/23/2017 0952   TRIG 111.0 06/23/2017 0952   HDL 32.10 (L) 06/23/2017 0952   CHOLHDL 4 06/23/2017 0952   VLDL 22.2 06/23/2017 0952   LDLCALC 86 06/23/2017 0952   Hepatic Function Panel     Component Value Date/Time   PROT 6.5 06/23/2017 0952   ALBUMIN 4.4 06/23/2017 0952   AST 20 06/23/2017 0952   ALT 15 06/23/2017 8299  ALKPHOS 75 06/23/2017 0952   BILITOT 0.5 06/23/2017 0952   BILIDIR 0.2 06/23/2017 0952   IBILI 0.3 03/20/2014 0935      Component Value Date/Time   TSH 1.08 06/23/2017 0952   TSH 1.95 06/26/2016 0752   TSH 2.39 06/25/2015 0740    ASSESSMENT AND PLAN: Vitamin D deficiency - Plan: Vitamin D, Ergocalciferol, (DRISDOL) 50000 units CAPS capsule  Insulin resistance  At risk for diabetes mellitus  Class 1 obesity with serious comorbidity and body mass index (BMI) of 30.0 to 30.9 in adult, unspecified obesity type - starting BMI >30  PLAN: Vitamin D Deficiency Kaitlin Foster was informed that low vitamin D levels contributes to fatigue and are associated with obesity, breast, and colon cancer. She agrees to continue to take prescription Vit D @50 ,000 IU every week, a refill is written today, and will follow up for routine testing of vitamin D, at least 2-3 times per year. She was informed of the risk of over-replacement of vitamin D and agrees to not increase her dose unless he discusses this with [place] first.  Insulin Resistance Kaitlin Foster will continue to work on weight loss, exercise, and decreasing simple carbohydrates in her diet to help decrease the risk of diabetes. We dicussed metformin including benefits and risks. She was informed that eating too many simple carbohydrates or too many calories at one sitting increases the likelihood of GI side effects. Kaitlin Foster did not request metformin for now and prescription was not written today. Kaitlin Foster agreed to follow up with [place] as directed to monitor her progress. She denies polyphagia.    Diabetes risk counselling Kaitlin Foster was given extended (15 minutes) diabetes prevention counseling today. She is 36 y.o. female and has risk factors for diabetes including obesity. We discussed intensive lifestyle modifications today with an emphasis on weight loss as well as increasing exercise and decreasing simple carbohydrates in her diet.  Obesity Kaitlin Foster is currently in the action stage of change. As such, her goal is to continue with weight loss efforts She has agreed to follow the Category 2 plan, 500 calorie and 40 protein for supper Kaitlin Foster has been instructed to work up to a goal of 150 minutes of combined cardio and strengthening exercise per week for weight loss and overall health benefits. We discussed the following Behavioral Modification Stratagies today: increasing lean protein intake and work on meal planning and Chief Technology Officer has agreed to follow up with our clinic in 3 weeks. She was informed of the importance of frequent follow up visits to maximize her success with intensive lifestyle modifications for her multiple health conditions.  I, April Moore , am acting as Location manager for [employer], PA-C  I have reviewed the above documentation for accuracy and completeness, and I agree with the above. -Lacy Duverney, PA-C  I have reviewed the above note and agree with the plan. Dennard Nip, MD   Office: [phone]  /  Fax: [phone]  [place]  Today's visit was # 4 out of 22.  Starting weight: 182 Starting date: 07/01/17 Today's weight : Weight: 170 lb (77.1 kg)  Today's date: 08/17/2017 Total lbs lost to date: 12 (Patients must lose 7 lbs in the first 6 months to continue with counseling)   ASK: We discussed the diagnosis of obesity with Kaitlin Foster today and Kaitlin Foster agreed to give [place] permission to discuss obesity behavioral modification therapy today.  ASSESS: Kaitlin Foster has the diagnosis of obesity and her BMI today is  29.17  Kaitlin Foster is in the action stage of change   ADVISE: Kaitlin Foster was educated on the multiple health risks of obesity as well as the benefit of weight loss to improve her health. She was advised of the need for long term treatment and the importance of lifestyle modifications.  AGREE: Multiple dietary modification options and treatment options were discussed and  Kaitlin Foster agreed to follow the Category 2 plan and keep a food journal for supper with 500 calories and 40 grams of protein. We discussed the following Behavioral Modification Stratagies today: increasing lean protein intake and work on meal planning and easy cooking plans

## 2017-08-30 ENCOUNTER — Ambulatory Visit (INDEPENDENT_AMBULATORY_CARE_PROVIDER_SITE_OTHER): Payer: 59 | Admitting: Physician Assistant

## 2017-08-30 VITALS — BP 126/75 | HR 72 | Temp 98.3°F | Ht 64.0 in | Wt 170.0 lb

## 2017-08-30 DIAGNOSIS — E6609 Other obesity due to excess calories: Secondary | ICD-10-CM | POA: Insufficient documentation

## 2017-08-30 DIAGNOSIS — E669 Obesity, unspecified: Secondary | ICD-10-CM | POA: Diagnosis not present

## 2017-08-30 DIAGNOSIS — E559 Vitamin D deficiency, unspecified: Secondary | ICD-10-CM | POA: Diagnosis not present

## 2017-08-30 DIAGNOSIS — E88819 Insulin resistance, unspecified: Secondary | ICD-10-CM

## 2017-08-30 DIAGNOSIS — Z6830 Body mass index (BMI) 30.0-30.9, adult: Secondary | ICD-10-CM | POA: Diagnosis not present

## 2017-08-30 DIAGNOSIS — Z9189 Other specified personal risk factors, not elsewhere classified: Secondary | ICD-10-CM | POA: Diagnosis not present

## 2017-08-30 DIAGNOSIS — E8881 Metabolic syndrome: Secondary | ICD-10-CM | POA: Diagnosis not present

## 2017-08-30 MED ORDER — VITAMIN D (ERGOCALCIFEROL) 1.25 MG (50000 UNIT) PO CAPS: 50000.0000 [IU] | ORAL_CAPSULE | ORAL | 0 refills | Status: DC

## 2017-08-30 NOTE — Progress Notes (Signed)
Office: [phone]  /  Fax: [phone]   HPI:   Chief Complaint: OBESITY Kaitlin Foster is here to discuss her progress with her obesity treatment plan. She is on the keep a food journal with 500 calories and 40 grams of protein at supper and Category 2 plan and is following her eating plan approximately 70 % of the time. She states she is exercising 0 minutes 0 times per week. [name] maintained her weight. She states she was on vacation but managed to control her portions and make smarter food choices. She would like more meal planning ideas. Her weight is 170 lb (77.1 kg) today and has not lost weight since her last visit. She has lost 12 lbs since starting treatment with [place].  Vitamin D deficiency Kaitlin Foster has a diagnosis of vitamin D deficiency. She is currently taking prescription Vit D and denies nausea, vomiting or muscle weakness.  At risk for osteopenia and osteoporosis Harmony is at higher risk of osteopenia and osteoporosis due to vitamin D deficiency.   Insulin Resistance Kaitlin Foster has a diagnosis of insulin resistance based on her elevated fasting insulin level >5. Although Kaitlin Foster's blood glucose readings are still under good control, insulin resistance puts her at greater risk of metabolic syndrome and diabetes. She is not taking metformin currently and continues to work on diet and exercise to decrease risk of diabetes.  ALLERGIES: Allergies  Allergen Reactions  . Codeine     REACTION: upset stomach once, but has taken it other times and been ok  . Lisinopril     cough  . Sulfonamide Derivatives Hives    MEDICATIONS: Current Outpatient Prescriptions on File Prior to Visit  Medication Sig Dispense Refill  . Aspirin-Acetaminophen-Caffeine (EXCEDRIN MIGRAINE PO) Take 2 tablets by mouth 2 (two) times daily as needed.    [name] azelastine (ASTELIN) 0.1 % nasal spray Place 1 spray into both nostrils daily. Use in each nostril as directed 30 mL 12  . azelastine (OPTIVAR) 0.05 % ophthalmic  solution 1 drop 2 (two) times daily as needed.  6 mL 12  . CARTIA XT 120 MG 24 hr capsule TAKE 1 CAPSULE BY MOUTH DAILY. 90 capsule 1  . fluticasone (FLONASE) 50 MCG/ACT nasal spray 2 sprays into each nostril daily 16 g 2  . HYDROcodone-acetaminophen (NORCO/VICODIN) 5-325 MG tablet Take 1 tablet by mouth every 6 (six) hours as needed for moderate pain. 12 tablet 0  . ibuprofen (ADVIL,MOTRIN) 600 MG tablet Take 600 mg by mouth every 6 (six) hours as needed.    [name] levocetirizine (XYZAL) 5 MG tablet Take 1 tablet (5 mg total) by mouth every evening.    . montelukast (SINGULAIR) 10 MG tablet Take 1 tablet (10 mg total) by mouth at bedtime. 90 tablet 1  . naproxen (NAPROXEN DR) 500 MG EC tablet Take 1 tablet (500 mg total) by mouth 2 (two) times daily with a meal. 30 tablet 0  . rizatriptan (MAXALT) 5 MG tablet Take 1 tablet (5 mg total) by mouth as needed for migraine. May repeat in 2 hours if needed 18 tablet 1  . Vitamin D, Ergocalciferol, (DRISDOL) 50000 units CAPS capsule Take 1 capsule (50,000 Units total) by mouth every 7 (seven) days. 4 capsule 0  . eletriptan (RELPAX) 20 MG tablet Take 1 tablet (20 mg total) by mouth once. May repeat in 2 hours if headache persists or recurs. 9 tablet 11   No current facility-administered medications on file prior to visit.     PAST MEDICAL  HISTORY: Past Medical History:  Diagnosis Date  . Anxiety   . AVM (arteriovenous malformation)    leaky  . Back pain   . Hypertension   . Joint pain   . Migraines     PAST SURGICAL HISTORY: Past Surgical History:  Procedure Laterality Date  . CESAREAN SECTION    . CRANIOTOMY  2008   left frontal AVM  . emobilization      SOCIAL HISTORY: Social History  Substance Use Topics  . Smoking status: Never Smoker  . Smokeless tobacco: Never Used  . Alcohol use No    FAMILY HISTORY: Family History  Problem Relation Age of Onset  . Hypertension Father   . Alcohol abuse Father   . Arthritis Father   .  Obesity Father   . Hypertension Mother   . Arthritis Mother   . Migraines Mother   . Obesity Mother   . Migraines Sister   . Ulcerative colitis Sister   . Migraines Maternal Aunt   . ADD / ADHD Son   . Cancer Neg Hx   . Heart disease Neg Hx     ROS: Review of Systems  Constitutional: Negative for weight loss.  Gastrointestinal: Negative for nausea and vomiting.  Musculoskeletal:       Negative muscle weakness    PHYSICAL EXAM: Blood pressure 126/75, pulse 72, temperature 98.3 F (36.8 C), temperature source Oral, height 5\' 4"  (1.626 m), weight 170 lb (77.1 kg), last menstrual period 08/07/2017, SpO2 100 %. Body mass index is 29.18 kg/m. Physical Exam  Constitutional: She is oriented to person, place, and time. She appears well-developed and well-nourished.  Cardiovascular: Normal rate.   Pulmonary/Chest: Effort normal.  Musculoskeletal: Normal range of motion.  Neurological: She is oriented to person, place, and time.  Skin: Skin is warm and dry.  Psychiatric: She has a normal mood and affect. Her behavior is normal.  Vitals reviewed.   RECENT LABS AND TESTS: BMET    Component Value Date/Time   NA 138 06/23/2017 0952   K 4.2 06/23/2017 0952   CL 103 06/23/2017 0952   CO2 32 06/23/2017 0952   GLUCOSE 93 06/23/2017 0952   BUN 15 06/23/2017 0952   CREATININE 0.70 06/23/2017 0952   CREATININE 0.72 03/20/2014 0935   CALCIUM 9.4 06/23/2017 0952   GFRNONAA >89 03/20/2014 0935   GFRAA >89 03/20/2014 0935   Lab Results  Component Value Date   HGBA1C 5.0 07/01/2017   Lab Results  Component Value Date   INSULIN 11.0 07/01/2017   CBC    Component Value Date/Time   WBC 5.9 06/23/2017 0952   RBC 5.00 06/23/2017 0952   HGB 15.4 (H) 06/23/2017 0952   HCT 46.1 (H) 06/23/2017 0952   PLT 251.0 06/23/2017 0952   MCV 92.2 06/23/2017 0952   MCH 28.2 06/20/2014 1653   MCHC 33.4 06/23/2017 0952   RDW 12.8 06/23/2017 0952   LYMPHSABS 1.5 06/23/2017 0952   MONOABS 0.5  06/23/2017 0952   EOSABS 0.1 06/23/2017 0952   BASOSABS 0.0 06/23/2017 0952   Iron/TIBC/Ferritin/ %Sat    Component Value Date/Time   IRON 75 06/23/2017 0952   FERRITIN 6.6 (L) 06/30/2016 0720   Lipid Panel     Component Value Date/Time   CHOL 140 06/23/2017 0952   TRIG 111.0 06/23/2017 0952   HDL 32.10 (L) 06/23/2017 0952   CHOLHDL 4 06/23/2017 0952   VLDL 22.2 06/23/2017 0952   LDLCALC 86 06/23/2017 0952   Hepatic Function Panel  Component Value Date/Time   PROT 6.5 06/23/2017 0952   ALBUMIN 4.4 06/23/2017 0952   AST 20 06/23/2017 0952   ALT 15 06/23/2017 0952   ALKPHOS 75 06/23/2017 0952   BILITOT 0.5 06/23/2017 0952   BILIDIR 0.2 06/23/2017 0952   IBILI 0.3 03/20/2014 0935      Component Value Date/Time   TSH 1.08 06/23/2017 0952   TSH 1.95 06/26/2016 0752   TSH 2.39 06/25/2015 0740    ASSESSMENT AND PLAN: Vitamin D deficiency - Plan: Vitamin D, Ergocalciferol, (DRISDOL) 50000 units CAPS capsule  Insulin resistance  At risk for osteoporosis  Class 1 obesity with serious comorbidity and body mass index (BMI) of 30.0 to 30.9 in adult, unspecified obesity type - starting BMI greater then 30  PLAN:  Vitamin D Deficiency Dashayla was informed that low vitamin D levels contributes to fatigue and are associated with obesity, breast, and colon cancer. She agrees to continue taking prescription Vit D @50 ,000 IU every week #4 and we will refill for 1 month. She will follow up for routine testing of vitamin D, at least 2-3 times per year. She was informed of the risk of over-replacement of vitamin D and agrees to not increase her dose unless he discusses this with [place] first. Julisa agrees to follow up with our clinic in 2 to 3 weeks.  At risk for osteopenia and osteoporosis Mireille is at risk for osteopenia and osteoporsis due to her vitamin D deficiency. She was encouraged to take her vitamin D and follow her higher calcium diet and increase strengthening exercise to help  strengthen her bones and decrease her risk of osteopenia and osteoporosis.  Insulin Resistance Shailee will continue to work on weight loss, diet, exercise, and decreasing simple carbohydrates in her diet to help decrease the risk of diabetes. We dicussed metformin including benefits and risks. She was informed that eating too many simple carbohydrates or too many calories at one sitting increases the likelihood of GI side effects. Briunna declined metformin for now and prescription was not written today. Kimberlea agrees to follow up with our clinic in 2 to 3 weeks as directed to monitor her progress.  Obesity Khamila is currently in the action stage of change. As such, her goal is to continue with weight loss efforts She has agreed to follow the Category 2 plan [place] has been instructed to work up to a goal of 150 minutes of combined cardio and strengthening exercise per week for weight loss and overall health benefits. We discussed the following Behavioral Modification Strategies today: increasing lean protein intake and work on meal planning and easy cooking plans   Estephania has agreed to follow up with our clinic in 2 to 3 weeks. She was informed of the importance of frequent follow up visits to maximize her success with intensive lifestyle modifications for her multiple health conditions.  I, Trixie Dredge, am acting as transcriptionist for Lacy Duverney, PA-C  I have reviewed the above documentation for accuracy and completeness, and I agree with the above. -Lacy Duverney, PA-C  I have reviewed the above note and agree with the plan. -Dennard Nip, MD     Today's visit was # 5 out of 22.  Starting weight: 182 lbs Starting date: 07/01/17 Today's weight : 170 lbs  Today's date: 08/30/2017 Total lbs lost to date: 12 (Patients must lose 7 lbs in the first 6 months to continue with counseling)   ASK: We discussed the diagnosis of obesity with [name]  today and Inza agreed to give [place]  permission to discuss obesity behavioral modification therapy today.  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 She was advised of the need for long term treatment and the importance of lifestyle modifications.  AGREE: Multiple dietary modification options and treatment options were discussed and  Tiffaney agreed to follow the Category 2 plan We discussed the following Behavioral Modification Strategies today: increasing lean protein intake and work on meal planning and easy cooking plans

## 2017-09-13 ENCOUNTER — Ambulatory Visit (INDEPENDENT_AMBULATORY_CARE_PROVIDER_SITE_OTHER): Payer: 59 | Admitting: Physician Assistant

## 2017-09-13 VITALS — BP 112/73 | HR 68 | Temp 98.7°F | Ht 64.0 in | Wt 168.0 lb

## 2017-09-13 DIAGNOSIS — E669 Obesity, unspecified: Secondary | ICD-10-CM | POA: Diagnosis not present

## 2017-09-13 DIAGNOSIS — E559 Vitamin D deficiency, unspecified: Secondary | ICD-10-CM

## 2017-09-13 DIAGNOSIS — Z6830 Body mass index (BMI) 30.0-30.9, adult: Secondary | ICD-10-CM | POA: Diagnosis not present

## 2017-09-13 NOTE — Progress Notes (Signed)
Office: [phone]  /  Fax: [phone]   HPI:   Chief Complaint: OBESITY Sakai is here to discuss her progress with her obesity treatment plan. She is on the Category 2 plan and is following her eating plan approximately 65 % of the time. She states she is exercising 0 minutes 0 times per week. Teletha continues to do well with weight loss. She continue to plan her meals well and states hunger is well controlled. She would like more variety with her meals. Her weight is 168 lb (76.2 kg) today and has had a weight loss of 2 pounds over a period of 2 weeks since her last visit. She has lost 14 lbs since starting treatment with [place].  Vitamin D deficiency Devani has a diagnosis of vitamin D deficiency. She is currently taking vit D and denies nausea, vomiting or muscle weakness.  ALLERGIES: Allergies  Allergen Reactions  . Codeine     REACTION: upset stomach once, but has taken it other times and been ok  . Lisinopril     cough  . Sulfonamide Derivatives Hives    MEDICATIONS: Current Outpatient Medications on File Prior to Visit  Medication Sig Dispense Refill  . Aspirin-Acetaminophen-Caffeine (EXCEDRIN MIGRAINE PO) Take 2 tablets by mouth 2 (two) times daily as needed.    [name] azelastine (ASTELIN) 0.1 % nasal spray Place 1 spray into both nostrils daily. Use in each nostril as directed 30 mL 12  . azelastine (OPTIVAR) 0.05 % ophthalmic solution 1 drop 2 (two) times daily as needed.  6 mL 12  . CARTIA XT 120 MG 24 hr capsule TAKE 1 CAPSULE BY MOUTH DAILY. 90 capsule 1  . fluticasone (FLONASE) 50 MCG/ACT nasal spray 2 sprays into each nostril daily 16 g 2  . HYDROcodone-acetaminophen (NORCO/VICODIN) 5-325 MG tablet Take 1 tablet by mouth every 6 (six) hours as needed for moderate pain. 12 tablet 0  . ibuprofen (ADVIL,MOTRIN) 600 MG tablet Take 600 mg by mouth every 6 (six) hours as needed.    [name] levocetirizine (XYZAL) 5 MG tablet Take 1 tablet (5 mg total) by mouth every evening.    .  montelukast (SINGULAIR) 10 MG tablet Take 1 tablet (10 mg total) by mouth at bedtime. 90 tablet 1  . naproxen (NAPROXEN DR) 500 MG EC tablet Take 1 tablet (500 mg total) by mouth 2 (two) times daily with a meal. 30 tablet 0  . rizatriptan (MAXALT) 5 MG tablet Take 1 tablet (5 mg total) by mouth as needed for migraine. May repeat in 2 hours if needed 18 tablet 1  . Vitamin D, Ergocalciferol, (DRISDOL) 50000 units CAPS capsule Take 1 capsule (50,000 Units total) by mouth every 7 (seven) days. 4 capsule 0  . eletriptan (RELPAX) 20 MG tablet Take 1 tablet (20 mg total) by mouth once. May repeat in 2 hours if headache persists or recurs. 9 tablet 11   No current facility-administered medications on file prior to visit.     PAST MEDICAL HISTORY: Past Medical History:  Diagnosis Date  . Anxiety   . AVM (arteriovenous malformation)    leaky  . Back pain   . Hypertension   . Joint pain   . Migraines     PAST SURGICAL HISTORY: Past Surgical History:  Procedure Laterality Date  . CESAREAN SECTION    . CRANIOTOMY  2008   left frontal AVM  . emobilization      SOCIAL HISTORY: Social History   Tobacco Use  . Smoking  status: Never Smoker  . Smokeless tobacco: Never Used  Substance Use Topics  . Alcohol use: No    Alcohol/week: 0.0 oz  . Drug use: No    FAMILY HISTORY: Family History  Problem Relation Age of Onset  . Hypertension Father   . Alcohol abuse Father   . Arthritis Father   . Obesity Father   . Hypertension Mother   . Arthritis Mother   . Migraines Mother   . Obesity Mother   . Migraines Sister   . Ulcerative colitis Sister   . Migraines Maternal Aunt   . ADD / ADHD Son   . Cancer Neg Hx   . Heart disease Neg Hx     ROS: Review of Systems  Constitutional: Positive for weight loss.  Gastrointestinal: Negative for nausea and vomiting.  Musculoskeletal:       Negative muscle weakness    PHYSICAL EXAM: Blood pressure 112/73, pulse 68, temperature 98.7 F  (37.1 C), temperature source Oral, height 5\' 4"  (1.626 m), weight 168 lb (76.2 kg), last menstrual period 09/12/2017, SpO2 98 %. Body mass index is 28.84 kg/m. Physical Exam  Constitutional: She is oriented to person, place, and time. She appears well-developed and well-nourished.  Cardiovascular: Normal rate.  Pulmonary/Chest: Effort normal.  Musculoskeletal: Normal range of motion.  Neurological: She is oriented to person, place, and time.  Skin: Skin is warm and dry.  Psychiatric: She has a normal mood and affect. Her behavior is normal.  Vitals reviewed.   RECENT LABS AND TESTS: BMET    Component Value Date/Time   NA 138 06/23/2017 0952   K 4.2 06/23/2017 0952   CL 103 06/23/2017 0952   CO2 32 06/23/2017 0952   GLUCOSE 93 06/23/2017 0952   BUN 15 06/23/2017 0952   CREATININE 0.70 06/23/2017 0952   CREATININE 0.72 03/20/2014 0935   CALCIUM 9.4 06/23/2017 0952   GFRNONAA >89 03/20/2014 0935   GFRAA >89 03/20/2014 0935   Lab Results  Component Value Date   HGBA1C 5.0 07/01/2017   Lab Results  Component Value Date   INSULIN 11.0 07/01/2017   CBC    Component Value Date/Time   WBC 5.9 06/23/2017 0952   RBC 5.00 06/23/2017 0952   HGB 15.4 (H) 06/23/2017 0952   HCT 46.1 (H) 06/23/2017 0952   PLT 251.0 06/23/2017 0952   MCV 92.2 06/23/2017 0952   MCH 28.2 06/20/2014 1653   MCHC 33.4 06/23/2017 0952   RDW 12.8 06/23/2017 0952   LYMPHSABS 1.5 06/23/2017 0952   MONOABS 0.5 06/23/2017 0952   EOSABS 0.1 06/23/2017 0952   BASOSABS 0.0 06/23/2017 0952   Iron/TIBC/Ferritin/ %Sat    Component Value Date/Time   IRON 75 06/23/2017 0952   FERRITIN 6.6 (L) 06/30/2016 0720   Lipid Panel     Component Value Date/Time   CHOL 140 06/23/2017 0952   TRIG 111.0 06/23/2017 0952   HDL 32.10 (L) 06/23/2017 0952   CHOLHDL 4 06/23/2017 0952   VLDL 22.2 06/23/2017 0952   LDLCALC 86 06/23/2017 0952   Hepatic Function Panel     Component Value Date/Time   PROT 6.5  06/23/2017 0952   ALBUMIN 4.4 06/23/2017 0952   AST 20 06/23/2017 0952   ALT 15 06/23/2017 0952   ALKPHOS 75 06/23/2017 0952   BILITOT 0.5 06/23/2017 0952   BILIDIR 0.2 06/23/2017 0952   IBILI 0.3 03/20/2014 0935      Component Value Date/Time   TSH 1.08 06/23/2017 0952   TSH  1.95 06/26/2016 0752   TSH 2.39 06/25/2015 0740    ASSESSMENT AND PLAN: Vitamin D deficiency  Class 1 obesity with serious comorbidity and body mass index (BMI) of 30.0 to 30.9 in adult, unspecified obesity type - Starting BMI greater then 30  PLAN:  Vitamin D Deficiency Torre was informed that low vitamin D levels contributes to fatigue and are associated with obesity, breast, and colon cancer. She agrees to continue to take prescription Vit D @50 ,000 IU every week and will follow up for routine testing of vitamin D, at least 2-3 times per year. She was informed of the risk of over-replacement of vitamin D and agrees to not increase her dose unless he discusses this with [place] first.  We spent > than 50% of the 15 minute visit on the counseling as documented in the note.   Obesity Kahlia is currently in the action stage of change. As such, her goal is to continue with weight loss efforts She has agreed to keep a food journal with 1200 to 1300 calories and 90 grams of protein daily Izzabella has been instructed to work up to a goal of 150 minutes of combined cardio and strengthening exercise per week for weight loss and overall health benefits. We discussed the following Behavioral Modification Strategies today: increasing lean protein intake and keep a strict food journal  Kendre has agreed to follow up with our clinic in 2 weeks. She was informed of the importance of frequent follow up visits to maximize her success with intensive lifestyle modifications for her multiple health conditions.  I, Doreene Nest, am acting as transcriptionist for Lacy Duverney, PA-C  I have reviewed the above documentation for accuracy  and completeness, and I agree with the above. -Lacy Duverney, PA-C  I have reviewed the above note and agree with the plan. -Dennard Nip, MD   OBESITY BEHAVIORAL INTERVENTION VISIT  Today's visit was # 6 out of 22.  Starting weight: 182 lbs Starting date: 07/01/17 Today's weight : 168 lbs Today's date: 09/13/2017 Total lbs lost to date: 14 (Patients must lose 7 lbs in the first 6 months to continue with counseling)   ASK: We discussed the diagnosis of obesity with [name] today and Micha agreed to give [place] permission to discuss obesity behavioral modification therapy today.  ASSESS: Shaylee has the diagnosis of obesity and her BMI today is 28.82 Novalie is in the action stage of change   ADVISE: Keonta was educated on the multiple health risks of obesity as well as the benefit of weight loss to improve her health. She was advised of the need for long term treatment and the importance of lifestyle modifications.  AGREE: Multiple dietary modification options and treatment options were discussed and  Meliss agreed to keep a food journal with 1200 to 1300 calories and 90 grams of protein daily We discussed the following Behavioral Modification Strategies today: increasing lean protein intake and keep a strict food journal

## 2017-09-15 ENCOUNTER — Encounter: Payer: Self-pay | Admitting: Neurology

## 2017-09-15 ENCOUNTER — Ambulatory Visit: Payer: 59 | Admitting: Neurology

## 2017-09-15 DIAGNOSIS — G43009 Migraine without aura, not intractable, without status migrainosus: Secondary | ICD-10-CM | POA: Diagnosis not present

## 2017-09-15 MED ORDER — RIZATRIPTAN BENZOATE 10 MG PO TABS: 10.0000 mg | ORAL_TABLET | Freq: Once | ORAL | 2 refills | Status: DC | PRN

## 2017-09-15 MED ORDER — TOPIRAMATE 25 MG PO TABS: 25.0000 mg | ORAL_TABLET | Freq: Two times a day (BID) | ORAL | 3 refills | Status: DC

## 2017-09-15 NOTE — Patient Instructions (Signed)
I had a long discussion with the patient regarding her migraine headaches which appear to have increased in severity and frequency the last several months.  Recommend she start back Topamax 25 mg daily for 1 week and increase to twice daily as tolerated for migraine prophylaxis.  Continue Maxalt but increase the dose to 10 mg daily and if this is not effective may need to switch back to Relpax 20 mg which was working better for her.  She was advised to avoid headache triggers.  She may also consider possible participation in the migraine prevention trial with the new CG RP receptor antagonist if interested.  She will return for follow-up in the future only as needed.

## 2017-09-15 NOTE — Progress Notes (Signed)
[facility] [address] [place]. [place] [address] [phone]       OFFICE FOLLOW UP VISIT NOTE  Kaitlin. Kaitlin Foster Date of Birth:  May 30, 1981 Medical Record Number:  [id number]   Referring MD:  Self  Reason for Referral:  Headaches  HPI: Kaitlin Foster is a 48 year pleasant Caucasian lady who has had chronic headaches since teenage years. 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 She underwent initial embolization 2 followed by craniotomy and excision of the AVM by Dr. Kalman Shan at [facility]. I reviewed her records in Care everywhere. She states the headaches change character after this. She has been having left frontal and temporal headaches once a week or so. She describes this as moderate in intensity 8/10 throbbing at times and pressure-like on other occasions. These last for several hours. Most of the time she doesn't take medications but when it becomes intolerable she takes migraine Excedrin which works fairly well. She has not been to urgent care or emergency room with these headaches. There are no apparent triggers for the headaches but they seem to be made worse by allergies, dehydration, stress and lack of sleep. She does get nausea and has light sensitivity and feels like lying down with the headaches. She has occasional visual spots with her headaches but no focal neurological symptoms. She does have some residual numbness on the left side of the face and mild facial weakness as a result of the surgery. She had follow-up catheter angiogram done in 2009 which showed that the AVM had not recurred but since then she's had no brain imaging studies or vascular imaging studies done. She does complain of tightness and pain in the back of her neck and muscle pulling sensation as well. She does take diltiazem for hypertension. She had tried propranolol for  migraine prophylaxis in the past but it did not help her. She denies drinking excessive caffeine. Update 09/17/2015 :  She  returns for follow-up after last visit 2 months ago. She states her headaches are doing much better since she has started Topamax. She has had only one migraine in that tooth when she had a stomach bug. She declined and Excedrin which helped. She's had a total of only the minor headaches for the last 2 months. She is tolerating Topamax 25 twice daily fairly well but does have some mild tingling of her fingers and toes which seems to be improving. She's also been very aggressive with treating her allergies which also may have helped. 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 No evidence of recurrent AVM or new intracranial abnormality. Update 09/15/2016 : She returns for follow-up after last visit a year ago. She continues to have. She did try tapering the Topamax but found that the headaches were more frequent in and she is back on Topamax but taking it only once a day and tolerating it well without side effects. She continues to use Relpax on once or twice a month with good symptomatic relief. Her headaches continue but to be triggered by allergies and change in weather. She has no new neurological complaints. She has not had any new medical problems either.   Update 09/15/2017 : Returns for follow-up after last visit a year ago.  She states she is doing well and tapered and discontinued Topamax 6 months ago.  She continues to have migraines which are mostly triggered by allergies as well as her menstrual cycles.  Last few months she has noticed increased headache frequency to about 5-10 headache days a month.  She was getting good response with Relpax but her insurance company change coverage and she was forced to try Maxalt 5 mg which is not working for her as  well.  She has increased sensitivity to touch and pinprick over the left forehead over the surgical site during her headaches. ROS:   14 system review of systems is positive for headache,  allergies and all other systems negative PMH:  Past Medical History:  Diagnosis Date  . Anxiety   . AVM (arteriovenous malformation)    leaky  . Back pain   . Hypertension   . Joint pain   . Migraines     Social History:  Social History   Socioeconomic History  . Marital status: Married    Spouse name: Thad  . Number of children: 1  . Years of education: Not on file  . Highest education level: Not on file  Social Needs  . Financial resource strain: Not on file  . Food insecurity - worry: Not on file  . Food insecurity - inability: Not on file  . Transportation needs - medical: Not on file  . Transportation needs - non-medical: Not on file  Occupational History  . Occupation: Therapist, sports  Tobacco Use  . Smoking status: Never Smoker  . Smokeless tobacco: Never Used  Substance and Sexual Activity  . Alcohol use: No    Alcohol/week: 0.0 oz  . Drug use: No  . Sexual activity: Yes    Birth control/protection: Condom  Other Topics Concern  . 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 Aspirin-Acetaminophen-Caffeine (EXCEDRIN MIGRAINE PO) Take 2 tablets by mouth 2 (two) times daily as needed.    [name] azelastine (ASTELIN) 0.1 % nasal spray Place 1 spray into both nostrils daily. Use in each nostril as directed 30 mL 12  . azelastine (OPTIVAR) 0.05 % ophthalmic solution 1 drop 2 (two) times daily as needed.  6 mL 12  . CARTIA XT 120 MG 24 hr capsule TAKE 1 CAPSULE BY MOUTH DAILY. 90 capsule 1  . fluticasone (FLONASE) 50 MCG/ACT nasal spray 2 sprays into  each nostril daily 16 g 2  . ibuprofen (ADVIL,MOTRIN) 600 MG tablet Take 600 mg by mouth every 6 (six) hours as needed.    [name] levocetirizine (XYZAL) 5 MG tablet Take 1 tablet (5 mg total) by mouth every evening.    . montelukast (SINGULAIR) 10 MG tablet Take 1 tablet (10 mg total) by mouth at bedtime. 90 tablet 1  . naproxen (NAPROXEN DR) 500 MG EC tablet Take 1 tablet (500 mg total) by mouth 2 (two) times daily with a meal. 30 tablet 0  . Vitamin D, Ergocalciferol, (DRISDOL) 50000 units CAPS capsule Take 1 capsule (50,000 Units total) by mouth every 7 (seven) days. 4 capsule 0   No current facility-administered medications on file prior to visit.  Allergies:   Allergies  Allergen Reactions  . Codeine     REACTION: upset stomach once, but has taken it other times and been ok  . Lisinopril     cough  . Sulfonamide Derivatives Hives    Physical Exam General: well developed, well nourished  young Caucasian lady, seated, in no evident distress Head: head normocephalic and atraumatic.   Neck: supple with no carotid or supraclavicular bruits Cardiovascular: regular rate and rhythm, no murmurs Musculoskeletal: Mild left frontal and temporal bony deformity at surgical site Skin:  no rash/petichiae Vascular:  Normal pulses all extremities  Neurologic Exam Mental Status: Awake and fully alert. Oriented to place and time. Recent and remote memory intact. Attention span, concentration and fund of knowledge appropriate. Mood and affect appropriate.  Cranial Nerves: Fundoscopic exam not done   Pupils equal, briskly reactive to light. Extraocular movements full without nystagmus. Visual fields full to confrontation. Hearing intact. Mild left lower facial asymmetry. Facial sensation intact. Face, tongue, palate moves normally and symmetrically.  Motor: Normal bulk and tone. Normal strength in all tested extremity muscles. Sensory.: intact to touch , pinprick , position and vibratory sensation.    Coordination: Rapid alternating movements normal in all extremities. Finger-to-nose and heel-to-shin performed accurately bilaterally. Gait and Station: Arises from chair without difficulty. Stance is normal. Gait demonstrates normal stride length and balance . Able to heel, toe and tandem walk without difficulty.  Reflexes: 1+ and symmetric. Toes downgoing.       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  Recommend she start back Topamax 25 mg daily for 1 week and increase to twice daily as tolerated for migraine prophylaxis.  Continue Maxalt but increase the dose to 10 mg daily and if this is not effective may need to switch back to Relpax 20 mg which was working better for her.  She was advised to avoid headache triggers.  She may also consider possible participation in the migraine prevention trial with the new  Oral CG RP receptor antagonist if interested.  Greater than 50% time during this 25-minute visit was spent on counseling and coordination of care about her migraine headaches and answering questions she will return for follow-up in the future only as needed.  Antony Contras, MD Note: This document was prepared with digital dictation and possible smart phrase technology. Any transcriptional errors that result from this process are unintentional.

## 2017-09-16 MED FILL — VIT D2 1.25 MG (50,000 UNIT: 1.25 MG | 28 days supply | Qty: 4 | Fill #0

## 2017-09-27 MED FILL — TOPIRAMATE 25 MG TAB: 25 | 60 days supply | Qty: 120 | Fill #0

## 2017-09-28 ENCOUNTER — Other Ambulatory Visit: Payer: Self-pay | Admitting: Family

## 2017-09-28 DIAGNOSIS — Z6830 Body mass index (BMI) 30.0-30.9, adult: Secondary | ICD-10-CM | POA: Diagnosis not present

## 2017-09-28 DIAGNOSIS — Z01419 Encounter for gynecological examination (general) (routine) without abnormal findings: Secondary | ICD-10-CM | POA: Diagnosis not present

## 2017-09-29 ENCOUNTER — Ambulatory Visit (INDEPENDENT_AMBULATORY_CARE_PROVIDER_SITE_OTHER): Payer: 59 | Admitting: Physician Assistant

## 2017-09-29 VITALS — BP 119/71 | HR 71 | Temp 97.7°F | Ht 64.0 in | Wt 164.0 lb

## 2017-09-29 DIAGNOSIS — E669 Obesity, unspecified: Secondary | ICD-10-CM

## 2017-09-29 DIAGNOSIS — E559 Vitamin D deficiency, unspecified: Secondary | ICD-10-CM | POA: Diagnosis not present

## 2017-09-29 DIAGNOSIS — Z6830 Body mass index (BMI) 30.0-30.9, adult: Secondary | ICD-10-CM | POA: Diagnosis not present

## 2017-09-29 MED ORDER — LEVOCETIRIZINE DIHYDROCHLORIDE 5 MG PO TABS: 5.0000 mg | ORAL_TABLET | Freq: Every evening | ORAL | 1 refills | Status: DC

## 2017-09-29 NOTE — Progress Notes (Signed)
Office: [phone]  /  Fax: [phone]   HPI:   Chief Complaint: OBESITY Kaitlin Foster is here to discuss her progress with her obesity treatment plan. She is on the keep a food journal with 1200-1300 calories and 90 grams of protein daily and is following her eating plan approximately 90 % of the time. She states she is exercising 0 minutes 0 times per week. Kaitlin Foster continues to do well with weight loss. She continues to incorporate variety into her meals. She would like holiday eating strategies.  Her weight is 164 lb (74.4 kg) today and has had a weight loss of 4 pounds over a period of 2 weeks since her last visit. She has lost 18 lbs since starting treatment with [place].  Vitamin D deficiency Kaitlin Foster has a diagnosis of vitamin D deficiency. She is currently taking prescription Vit D and denies nausea, vomiting or muscle weakness.  ALLERGIES: Allergies  Allergen Reactions  . Codeine     REACTION: upset stomach once, but has taken it other times and been ok  . Lisinopril     cough  . Sulfonamide Derivatives Hives    MEDICATIONS: Current Outpatient Medications on File Prior to Visit  Medication Sig Dispense Refill  . Aspirin-Acetaminophen-Caffeine (EXCEDRIN MIGRAINE PO) Take 2 tablets by mouth 2 (two) times daily as needed.    [name] azelastine (ASTELIN) 0.1 % nasal spray Place 1 spray into both nostrils daily. Use in each nostril as directed 30 mL 12  . azelastine (OPTIVAR) 0.05 % ophthalmic solution 1 drop 2 (two) times daily as needed.  6 mL 12  . CARTIA XT 120 MG 24 hr capsule TAKE 1 CAPSULE BY MOUTH DAILY. 90 capsule 1  . fluticasone (FLONASE) 50 MCG/ACT nasal spray 2 sprays into each nostril daily 16 g 2  . ibuprofen (ADVIL,MOTRIN) 600 MG tablet Take 600 mg by mouth every 6 (six) hours as needed.    . montelukast (SINGULAIR) 10 MG tablet Take 1 tablet (10 mg total) by mouth at bedtime. 90 tablet 1  . naproxen (NAPROXEN DR) 500 MG EC tablet Take 1 tablet (500 mg total) by mouth 2 (two)  times daily with a meal. 30 tablet 0  . rizatriptan (MAXALT) 10 MG tablet Take 1 tablet (10 mg total) once as needed by mouth for migraine. May repeat in 2 hours if needed 30 tablet 2  . topiramate (TOPAMAX) 25 MG tablet Take 1 tablet (25 mg total) 2 (two) times daily by mouth. 120 tablet 3  . Vitamin D, Ergocalciferol, (DRISDOL) 50000 units CAPS capsule Take 1 capsule (50,000 Units total) by mouth every 7 (seven) days. 4 capsule 0  . levocetirizine (XYZAL) 5 MG tablet Take 1 tablet (5 mg total) by mouth every evening. 90 tablet 1   No current facility-administered medications on file prior to visit.     PAST MEDICAL HISTORY: Past Medical History:  Diagnosis Date  . Anxiety   . AVM (arteriovenous malformation)    leaky  . Back pain   . Hypertension   . Joint pain   . Migraines     PAST SURGICAL HISTORY: Past Surgical History:  Procedure Laterality Date  . CESAREAN SECTION    . CRANIOTOMY  2008   left frontal AVM  . emobilization      SOCIAL HISTORY: Social History   Tobacco Use  . Smoking status: Never Smoker  . Smokeless tobacco: Never Used  Substance Use Topics  . Alcohol use: No    Alcohol/week: 0.0 oz  .  Drug use: No    FAMILY HISTORY: Family History  Problem Relation Age of Onset  . Hypertension Father   . Alcohol abuse Father   . Arthritis Father   . Obesity Father   . Hypertension Mother   . Arthritis Mother   . Migraines Mother   . Obesity Mother   . Migraines Sister   . Ulcerative colitis Sister   . Migraines Maternal Aunt   . ADD / ADHD Son   . Cancer Neg Hx   . Heart disease Neg Hx     ROS: Review of Systems  Constitutional: Positive for weight loss.  Gastrointestinal: Negative for nausea and vomiting.  Musculoskeletal:       Negative muscle weakness    PHYSICAL EXAM: Blood pressure 119/71, pulse 71, temperature 97.7 F (36.5 C), temperature source Oral, height 5\' 4"  (1.626 m), weight 164 lb (74.4 kg), last menstrual period 09/12/2017,  SpO2 97 %. Body mass index is 28.15 kg/m. Physical Exam  Constitutional: She is oriented to person, place, and time. She appears well-developed and well-nourished.  Cardiovascular: Normal rate.  Pulmonary/Chest: Effort normal.  Musculoskeletal: Normal range of motion.  Neurological: She is oriented to person, place, and time.  Skin: Skin is warm and dry.  Psychiatric: She has a normal mood and affect. Her behavior is normal.  Vitals reviewed.   RECENT LABS AND TESTS: BMET    Component Value Date/Time   NA 138 06/23/2017 0952   K 4.2 06/23/2017 0952   CL 103 06/23/2017 0952   CO2 32 06/23/2017 0952   GLUCOSE 93 06/23/2017 0952   BUN 15 06/23/2017 0952   CREATININE 0.70 06/23/2017 0952   CREATININE 0.72 03/20/2014 0935   CALCIUM 9.4 06/23/2017 0952   GFRNONAA >89 03/20/2014 0935   GFRAA >89 03/20/2014 0935   Lab Results  Component Value Date   HGBA1C 5.0 07/01/2017   Lab Results  Component Value Date   INSULIN 11.0 07/01/2017   CBC    Component Value Date/Time   WBC 5.9 06/23/2017 0952   RBC 5.00 06/23/2017 0952   HGB 15.4 (H) 06/23/2017 0952   HCT 46.1 (H) 06/23/2017 0952   PLT 251.0 06/23/2017 0952   MCV 92.2 06/23/2017 0952   MCH 28.2 06/20/2014 1653   MCHC 33.4 06/23/2017 0952   RDW 12.8 06/23/2017 0952   LYMPHSABS 1.5 06/23/2017 0952   MONOABS 0.5 06/23/2017 0952   EOSABS 0.1 06/23/2017 0952   BASOSABS 0.0 06/23/2017 0952   Iron/TIBC/Ferritin/ %Sat    Component Value Date/Time   IRON 75 06/23/2017 0952   FERRITIN 6.6 (L) 06/30/2016 0720   Lipid Panel     Component Value Date/Time   CHOL 140 06/23/2017 0952   TRIG 111.0 06/23/2017 0952   HDL 32.10 (L) 06/23/2017 0952   CHOLHDL 4 06/23/2017 0952   VLDL 22.2 06/23/2017 0952   LDLCALC 86 06/23/2017 0952   Hepatic Function Panel     Component Value Date/Time   PROT 6.5 06/23/2017 0952   ALBUMIN 4.4 06/23/2017 0952   AST 20 06/23/2017 0952   ALT 15 06/23/2017 0952   ALKPHOS 75 06/23/2017 0952    BILITOT 0.5 06/23/2017 0952   BILIDIR 0.2 06/23/2017 0952   IBILI 0.3 03/20/2014 0935      Component Value Date/Time   TSH 1.08 06/23/2017 0952   TSH 1.95 06/26/2016 0752   TSH 2.39 06/25/2015 0740    ASSESSMENT AND PLAN: Vitamin D deficiency  Class 1 obesity with serious comorbidity and  body mass index (BMI) of 30.0 to 30.9 in adult, unspecified obesity type - Starting BMI greater then 30  PLAN:  Vitamin D Deficiency Kaitlin Foster was informed that low vitamin D levels contributes to fatigue and are associated with obesity, breast, and colon cancer. Kaitlin Foster agrees to continue taking prescription Vit D @50 ,000 IU every week #4 and will follow up for routine testing of vitamin D, at least 2-3 times per year. She was informed of the risk of over-replacement of vitamin D and agrees to not increase her dose unless he discusses this with [place] first. Kaitlin Foster agrees to follow up with our clinic in 2 weeks.  We spent > than 50% of the 15 minute visit on the counseling as documented in the note.  Obesity Kaitlin Foster is currently in the action stage of change. As such, her goal is to continue with weight loss efforts She has agreed to keep a food journal with 1200-1300 calories and 90 grams of protein daily Kaitlin Foster has been instructed to work up to a goal of 150 minutes of combined cardio and strengthening exercise per week for weight loss and overall health benefits. We discussed the following Behavioral Modification Strategies today: increasing lean protein intake and holiday eating strategies    Kaitlin Foster has agreed to follow up with our clinic in 2 weeks. She was informed of the importance of frequent follow up visits to maximize her success with intensive lifestyle modifications for her multiple health conditions.  I, Trixie Dredge, am acting as transcriptionist for Lacy Duverney, PA-C  I have reviewed the above documentation for accuracy and completeness, and I agree with the above. -Lacy Duverney, PA-C  I  have reviewed the above note and agree with the plan. -Dennard Nip, MD     Today's visit was # 7 out of 22.  Starting weight: 182 lbs Starting date: 07/01/17 Today's weight : 164 lbs  Today's date: 09/29/2017 Total lbs lost to date: 74 (Patients must lose 7 lbs in the first 6 months to continue with counseling)   ASK: We discussed the diagnosis of obesity with Kaitlin Foster today and Kaitlin Foster agreed to give [place] permission to discuss obesity behavioral modification therapy today.  ASSESS: Kaitlin Foster has the diagnosis of obesity and her BMI today is 28.14 Kaitlin Foster is in the action stage of change   ADVISE: Kaitlin Foster was educated on the multiple health risks of obesity as well as the benefit of weight loss to improve her health. She was advised of the need for long term treatment and the importance of lifestyle modifications.  AGREE: Multiple dietary modification options and treatment options were discussed and  Kaitlin Foster agreed to keep a food journal with 1200-1300 calories and 90 grams of protein daily We discussed the following Behavioral Modification Strategies today: increasing lean protein intake and holiday eating strategies

## 2017-10-14 MED FILL — MONTELUKAST SOD 10 MG TAB: 10 | 90 days supply | Qty: 90 | Fill #1

## 2017-10-14 MED FILL — CARTIA XT 120 MG CAPSULE SA: 120 | 90 days supply | Qty: 90 | Fill #1

## 2017-10-14 MED FILL — RIZATRIPTAN 10 MG TABLET: 10 | 30 days supply | Qty: 12 | Fill #0

## 2017-10-14 MED FILL — LEVOCETIRIZINE 5 MG TABLET: 5 | 90 days supply | Qty: 90 | Fill #0

## 2017-10-20 ENCOUNTER — Ambulatory Visit (INDEPENDENT_AMBULATORY_CARE_PROVIDER_SITE_OTHER): Payer: 59 | Admitting: Physician Assistant

## 2017-10-26 ENCOUNTER — Ambulatory Visit (INDEPENDENT_AMBULATORY_CARE_PROVIDER_SITE_OTHER): Payer: 59 | Admitting: Family Medicine

## 2017-10-26 VITALS — BP 122/79 | HR 78 | Temp 97.8°F | Ht 64.0 in | Wt 161.0 lb

## 2017-10-26 DIAGNOSIS — E559 Vitamin D deficiency, unspecified: Secondary | ICD-10-CM

## 2017-10-26 DIAGNOSIS — Z9189 Other specified personal risk factors, not elsewhere classified: Secondary | ICD-10-CM | POA: Diagnosis not present

## 2017-10-26 DIAGNOSIS — E8881 Metabolic syndrome: Secondary | ICD-10-CM

## 2017-10-26 DIAGNOSIS — Z6830 Body mass index (BMI) 30.0-30.9, adult: Secondary | ICD-10-CM | POA: Diagnosis not present

## 2017-10-26 DIAGNOSIS — D508 Other iron deficiency anemias: Secondary | ICD-10-CM

## 2017-10-26 DIAGNOSIS — E669 Obesity, unspecified: Secondary | ICD-10-CM | POA: Diagnosis not present

## 2017-10-27 ENCOUNTER — Encounter (INDEPENDENT_AMBULATORY_CARE_PROVIDER_SITE_OTHER): Payer: Self-pay | Admitting: Family Medicine

## 2017-10-27 LAB — ANEMIA PANEL
FOLATE, HEMOLYSATE: 422.5 ng/mL
Ferritin: 20 ng/mL (ref 15–150)
Folate, RBC: 996 ng/mL (ref >498)
HEMATOCRIT: 42.4 % (ref 34.0–46.6)
IRON: 74 ug/dL (ref 27–159)
Iron Saturation: 24 % (ref 15–55)
Retic Ct Pct: 1.6 % (ref 0.6–2.6)
Total Iron Binding Capacity: 309 ug/dL (ref 250–450)
UIBC: 235 ug/dL (ref 131–425)
VITAMIN B 12: 341 pg/mL (ref 232–1245)

## 2017-10-27 LAB — CBC WITH DIFFERENTIAL
BASOS ABS: 0 10*3/uL (ref 0.0–0.2)
BASOS: 0 %
EOS (ABSOLUTE): 0.1 10*3/uL (ref 0.0–0.4)
Eos: 2 %
Hemoglobin: 13.9 g/dL (ref 11.1–15.9)
IMMATURE GRANS (ABS): 0 10*3/uL (ref 0.0–0.1)
Immature Granulocytes: 0 %
LYMPHS: 36 %
Lymphocytes Absolute: 1.6 10*3/uL (ref 0.7–3.1)
MCH: 29.5 pg (ref 26.6–33.0)
MCHC: 32.8 g/dL (ref 31.5–35.7)
MCV: 90 fL (ref 79–97)
MONOS ABS: 0.3 10*3/uL (ref 0.1–0.9)
Monocytes: 7 %
NEUTROS PCT: 55 %
Neutrophils Absolute: 2.4 10*3/uL (ref 1.4–7.0)
RBC: 4.71 x10E6/uL (ref 3.77–5.28)
RDW: 12.7 % (ref 12.3–15.4)
WBC: 4.4 10*3/uL (ref 3.4–10.8)

## 2017-10-27 LAB — COMPREHENSIVE METABOLIC PANEL
A/G RATIO: 1.7 (ref 1.2–2.2)
ALBUMIN: 4.4 g/dL (ref 3.5–5.5)
ALK PHOS: 80 IU/L (ref 39–117)
ALT: 14 IU/L (ref 0–32)
AST: 20 IU/L (ref 0–40)
BILIRUBIN TOTAL: 0.4 mg/dL (ref 0.0–1.2)
BUN / CREAT RATIO: 18 (ref 9–23)
BUN: 17 mg/dL (ref 6–20)
CHLORIDE: 106 mmol/L (ref 96–106)
CO2: 24 mmol/L (ref 20–29)
Calcium: 9.5 mg/dL (ref 8.7–10.2)
Creatinine, Ser: 0.95 mg/dL (ref 0.57–1.00)
GFR calc non Af Amer: 78 mL/min/{1.73_m2} (ref >59)
GFR, EST AFRICAN AMERICAN: 90 mL/min/{1.73_m2} (ref >59)
GLOBULIN, TOTAL: 2.6 g/dL (ref 1.5–4.5)
Glucose: 82 mg/dL (ref 65–99)
POTASSIUM: 4.1 mmol/L (ref 3.5–5.2)
SODIUM: 144 mmol/L (ref 134–144)
TOTAL PROTEIN: 7 g/dL (ref 6.0–8.5)

## 2017-10-27 LAB — HEMOGLOBIN A1C
Est. average glucose Bld gHb Est-mCnc: 97 mg/dL
HEMOGLOBIN A1C: 5 % (ref 4.8–5.6)

## 2017-10-27 LAB — INSULIN, RANDOM: INSULIN: 8.4 u[IU]/mL (ref 2.6–24.9)

## 2017-10-27 LAB — VITAMIN D 25 HYDROXY (VIT D DEFICIENCY, FRACTURES): VIT D 25 HYDROXY: 49.7 ng/mL (ref 30.0–100.0)

## 2017-10-28 MED ORDER — VITAMIN D (ERGOCALCIFEROL) 1.25 MG (50000 UNIT) PO CAPS: 50000.0000 [IU] | ORAL_CAPSULE | ORAL | 0 refills | Status: DC

## 2017-10-28 MED FILL — VIT D2 1.25 MG (50,000 UNIT: 1.25 MG | 28 days supply | Qty: 4 | Fill #0

## 2017-10-28 NOTE — Progress Notes (Signed)
Office: [phone]  /  Fax: [phone]   HPI:   Chief Complaint: OBESITY Kaitlin Foster is here to discuss her progress with her obesity treatment plan. She is on the keep a food journal with 1200-1500 calories and 90 grams of protein daily and is following her eating plan approximately 50 % of the time. She states she is exercising 0 minutes 0 times per week. [name] continues to do well with weight loss even with increased celebration eating and increased temptations. She is doing well with meal prep and decreasing sweets and liquid calories.  Her weight is 161 lb (73 kg) today and has had a weight loss of 3 pounds over a period of 4 weeks since her last visit. She has lost 21 lbs since starting treatment with [place].  Vitamin D deficiency [name] has a diagnosis of vitamin D deficiency. She is stable on prescription Vit D, not yet at goal but she is due for labs. She denies nausea, vomiting or muscle weakness.  Insulin Resistance [name] has a diagnosis of insulin resistance based on her elevated fasting insulin level >5. Although [name]'s blood glucose readings are still under good control, insulin resistance puts her at greater risk of metabolic syndrome and diabetes. She is stable, and notes decreased polyphagia with low simple carbohydrates diet. She is due for labs and she denies hypoglycemia. She is not taking metformin currently and continues to work on diet and exercise to decrease risk of diabetes.  At risk for diabetes [name] is at higher than average risk for developing diabetes due to her obesity and insulin resistance. She currently denies polyuria or polydipsia.  Iron Deficiency Anemia [name] has a diagnosis of iron deficiency anemia. She has been off iron since August when her iron appeared to be over-replaced. She is due for labs She notes fatigue has improved and she denies palpitations.  ALLERGIES: Allergies  Allergen Reactions  . Codeine     REACTION: upset stomach once, but has  taken it other times and been ok  . Lisinopril     cough  . Sulfonamide Derivatives Hives    MEDICATIONS: Current Outpatient Medications on File Prior to Visit  Medication Sig Dispense Refill  . Aspirin-Acetaminophen-Caffeine (EXCEDRIN MIGRAINE PO) Take 2 tablets by mouth 2 (two) times daily as needed.    [name] azelastine (ASTELIN) 0.1 % nasal spray Place 1 spray into both nostrils daily. Use in each nostril as directed 30 mL 12  . azelastine (OPTIVAR) 0.05 % ophthalmic solution 1 drop 2 (two) times daily as needed.  6 mL 12  . CARTIA XT 120 MG 24 hr capsule TAKE 1 CAPSULE BY MOUTH DAILY. 90 capsule 1  . fluticasone (FLONASE) 50 MCG/ACT nasal spray 2 sprays into each nostril daily 16 g 2  . ibuprofen (ADVIL,MOTRIN) 600 MG tablet Take 600 mg by mouth every 6 (six) hours as needed.    [name] levocetirizine (XYZAL) 5 MG tablet Take 1 tablet (5 mg total) by mouth every evening. 90 tablet 1  . montelukast (SINGULAIR) 10 MG tablet Take 1 tablet (10 mg total) by mouth at bedtime. 90 tablet 1  . naproxen (NAPROXEN DR) 500 MG EC tablet Take 1 tablet (500 mg total) by mouth 2 (two) times daily with a meal. 30 tablet 0  . rizatriptan (MAXALT) 10 MG tablet Take 1 tablet (10 mg total) once as needed by mouth for migraine. May repeat in 2 hours if needed 30 tablet 2  . topiramate (TOPAMAX) 25 MG tablet Take 1  tablet (25 mg total) 2 (two) times daily by mouth. 120 tablet 3  . Vitamin D, Ergocalciferol, (DRISDOL) 50000 units CAPS capsule Take 1 capsule (50,000 Units total) by mouth every 7 (seven) days. 4 capsule 0   No current facility-administered medications on file prior to visit.     PAST MEDICAL HISTORY: Past Medical History:  Diagnosis Date  . Anxiety   . AVM (arteriovenous malformation)    leaky  . Back pain   . Hypertension   . Joint pain   . Migraines     PAST SURGICAL HISTORY: Past Surgical History:  Procedure Laterality Date  . CESAREAN SECTION    . CRANIOTOMY  2008   left frontal AVM    . emobilization      SOCIAL HISTORY: Social History   Tobacco Use  . Smoking status: Never Smoker  . Smokeless tobacco: Never Used  Substance Use Topics  . Alcohol use: No    Alcohol/week: 0.0 oz  . Drug use: No    FAMILY HISTORY: Family History  Problem Relation Age of Onset  . Hypertension Father   . Alcohol abuse Father   . Arthritis Father   . Obesity Father   . Hypertension Mother   . Arthritis Mother   . Migraines Mother   . Obesity Mother   . Migraines Sister   . Ulcerative colitis Sister   . Migraines Maternal Aunt   . ADD / ADHD Son   . Cancer Neg Hx   . Heart disease Neg Hx     ROS: Review of Systems  Constitutional: Positive for malaise/fatigue and weight loss.  Cardiovascular: Negative for palpitations.  Gastrointestinal: Negative for nausea and vomiting.  Genitourinary: Negative for frequency.  Musculoskeletal:       Negative muscle weakness  Endo/Heme/Allergies: Negative for polydipsia.       Positive polyphagia Negative hypoglycemia    PHYSICAL EXAM: Blood pressure 122/79, pulse 78, temperature 97.8 F (36.6 C), temperature source Oral, height 5\' 4"  (1.626 m), weight 161 lb (73 kg), last menstrual period 10/11/2017, SpO2 98 %. Body mass index is 27.64 kg/m. Physical Exam  Constitutional: She is oriented to person, place, and time. She appears well-developed and well-nourished.  Cardiovascular: Normal rate.  Pulmonary/Chest: Effort normal.  Musculoskeletal: Normal range of motion.  Neurological: She is oriented to person, place, and time.  Skin: Skin is warm and dry.  Psychiatric: She has a normal mood and affect. Her behavior is normal.  Vitals reviewed.   RECENT LABS AND TESTS: BMET    Component Value Date/Time   NA 144 10/26/2017 0917   K 4.1 10/26/2017 0917   CL 106 10/26/2017 0917   CO2 24 10/26/2017 0917   GLUCOSE 82 10/26/2017 0917   GLUCOSE 93 06/23/2017 0952   BUN 17 10/26/2017 0917   CREATININE 0.95 10/26/2017 0917    CREATININE 0.72 03/20/2014 0935   CALCIUM 9.5 10/26/2017 0917   GFRNONAA 78 10/26/2017 0917   GFRNONAA >89 03/20/2014 0935   GFRAA 90 10/26/2017 0917   GFRAA >89 03/20/2014 0935   Lab Results  Component Value Date   HGBA1C 5.0 10/26/2017   HGBA1C 5.0 07/01/2017   Lab Results  Component Value Date   INSULIN 8.4 10/26/2017   INSULIN 11.0 07/01/2017   CBC    Component Value Date/Time   WBC 4.4 10/26/2017 0917   WBC 5.9 06/23/2017 0952   RBC 4.71 10/26/2017 0917   RBC 5.00 06/23/2017 0952   HGB 13.9 10/26/2017 0917  HCT 42.4 10/26/2017 0917   PLT 251.0 06/23/2017 0952   MCV 90 10/26/2017 0917   MCH 29.5 10/26/2017 0917   MCH 28.2 06/20/2014 1653   MCHC 32.8 10/26/2017 0917   MCHC 33.4 06/23/2017 0952   RDW 12.7 10/26/2017 0917   LYMPHSABS 1.6 10/26/2017 0917   MONOABS 0.5 06/23/2017 0952   EOSABS 0.1 10/26/2017 0917   BASOSABS 0.0 10/26/2017 0917   Iron/TIBC/Ferritin/ %Sat    Component Value Date/Time   IRON 74 10/26/2017 0917   TIBC 309 10/26/2017 0917   FERRITIN 20 10/26/2017 0917   IRONPCTSAT 24 10/26/2017 0917   Lipid Panel     Component Value Date/Time   CHOL 140 06/23/2017 0952   TRIG 111.0 06/23/2017 0952   HDL 32.10 (L) 06/23/2017 0952   CHOLHDL 4 06/23/2017 0952   VLDL 22.2 06/23/2017 0952   LDLCALC 86 06/23/2017 0952   Hepatic Function Panel     Component Value Date/Time   PROT 7.0 10/26/2017 0917   ALBUMIN 4.4 10/26/2017 0917   AST 20 10/26/2017 0917   ALT 14 10/26/2017 0917   ALKPHOS 80 10/26/2017 0917   BILITOT 0.4 10/26/2017 0917   BILIDIR 0.2 06/23/2017 0952   IBILI 0.3 03/20/2014 0935      Component Value Date/Time   TSH 1.08 06/23/2017 0952   TSH 1.95 06/26/2016 0752   TSH 2.39 06/25/2015 0740    ASSESSMENT AND PLAN: Vitamin D deficiency - Plan: VITAMIN D 25 Hydroxy (Vit-D Deficiency, Fractures)  Insulin resistance - Plan: Comprehensive metabolic panel, Hemoglobin A1c, Insulin, random  Other iron deficiency anemia - Plan:  Anemia panel, CBC With Differential  Class 1 obesity with serious comorbidity and body mass index (BMI) of 30.0 to 30.9 in adult, unspecified obesity type - Stating BMI greater then 30  PLAN:  Vitamin D Deficiency Merlie was informed that low vitamin D levels contributes to fatigue and are associated with obesity, breast, and colon cancer. Kamica agrees to continue taking prescription Vit D @50 ,000 IU every week #4 and we will refill for 1 month. She will follow up for routine testing of vitamin D, at least 2-3 times per year. She was informed of the risk of over-replacement of vitamin D and agrees to not increase her dose unless he discusses this with [place] first. We will check labs and Katelan agrees to follow up with our clinic in 3 weeks.  Insulin Resistance Albana will continue to work on weight loss, diet, exercise, and decreasing simple carbohydrates in her diet to help decrease the risk of diabetes. We dicussed metformin including benefits and risks. She was informed that eating too many simple carbohydrates or too many calories at one sitting increases the likelihood of GI side effects. Tianne declined metformin for now and prescription was not written today. We will check labs and Evalette agrees to follow up with our clinic in 3 weeks as directed to monitor her progress.  Diabetes risk counselling Shree was given extended (15 minutes) diabetes prevention counseling today. She is 36 y.o. female and has risk factors for diabetes including obesity and insulin resistance. We discussed intensive lifestyle modifications today with an emphasis on weight loss as well as increasing exercise and decreasing simple carbohydrates in her diet.  Iron Deficiency Anemia The diagnosis of Iron deficiency anemia was discussed with Susann and was explained in detail. She was given suggestions of iron rich foods and and iron supplement was not prescribed. We will check labs and Bowen agrees to follow up with  our clinic in 3  weeks.  Obesity Janeese is currently in the action stage of change. As such, her goal is to continue with weight loss efforts She has agreed to keep a food journal with 1200-1500 calories and 90+ grams of protein daily Praise has been instructed to work up to a goal of 150 minutes of combined cardio and strengthening exercise per week for weight loss and overall health benefits. We discussed the following Behavioral Modification Strategies today: increasing lean protein intake, work on meal planning and easy cooking plans, dealing with family or coworker sabotage and holiday eating strategies    Delayni has agreed to follow up with our clinic in 3 weeks. She was informed of the importance of frequent follow up visits to maximize her success with intensive lifestyle modifications for her multiple health conditions.  I, Trixie Dredge, am acting as transcriptionist for Dennard Nip, MD  I have reviewed the above documentation for accuracy and completeness, and I agree with the above. -Dennard Nip, MD     Today's visit was # 8 out of 22.  Starting weight: 182 lbs Starting date: 07/01/17 Today's weight : 161 lbs  Today's date: 10/26/2017 Total lbs lost to date: 21 (Patients must lose 7 lbs in the first 6 months to continue with counseling)   ASK: We discussed the diagnosis of obesity with [name] today and Aleiyah agreed to give [place] permission to discuss obesity behavioral modification therapy today.  ASSESS: Jailee has the diagnosis of obesity and her BMI today is 27.62 Goldie is in the action stage of change   ADVISE: Lula was educated on the multiple health risks of obesity as well as the benefit of weight loss to improve her health. She was advised of the need for long term treatment and the importance of lifestyle modifications.  AGREE: Multiple dietary modification options and treatment options were discussed and  Jourdin agreed to keep a food journal with 1200-1500 calories  and 90+ grams of protein daily We discussed the following Behavioral Modification Strategies today: increasing lean protein intake, work on meal planning and easy cooking plans, dealing with family or coworker sabotage and holiday eating strategies

## 2017-11-10 ENCOUNTER — Encounter: Payer: Self-pay | Admitting: Family

## 2017-11-11 NOTE — Telephone Encounter (Signed)
Kaitlin Foster, this patient needs a referral to Dr. Leafy Ro. Could you please contact their office to arrange an appointment and also ask them which order I should place in Epic for referral to their office?

## 2017-11-22 ENCOUNTER — Ambulatory Visit (INDEPENDENT_AMBULATORY_CARE_PROVIDER_SITE_OTHER): Payer: Self-pay | Admitting: Family

## 2017-11-22 ENCOUNTER — Ambulatory Visit (INDEPENDENT_AMBULATORY_CARE_PROVIDER_SITE_OTHER): Payer: 59 | Admitting: Family Medicine

## 2017-11-22 ENCOUNTER — Encounter (INDEPENDENT_AMBULATORY_CARE_PROVIDER_SITE_OTHER): Payer: Self-pay

## 2017-11-22 ENCOUNTER — Encounter: Payer: Self-pay | Admitting: Family

## 2017-11-22 VITALS — BP 110/70 | HR 94 | Temp 97.6°F | Resp 16 | Wt 172.6 lb

## 2017-11-22 DIAGNOSIS — B9689 Other specified bacterial agents as the cause of diseases classified elsewhere: Secondary | ICD-10-CM | POA: Insufficient documentation

## 2017-11-22 DIAGNOSIS — J028 Acute pharyngitis due to other specified organisms: Secondary | ICD-10-CM

## 2017-11-22 MED ORDER — FLUCONAZOLE 150 MG PO TABS: 150.0000 mg | ORAL_TABLET | Freq: Once | ORAL | 0 refills | Status: AC

## 2017-11-22 MED ORDER — PREDNISONE 5 MG PO TABS: 5.0000 mg | ORAL_TABLET | ORAL | 0 refills | Status: DC

## 2017-11-22 MED ORDER — AZITHROMYCIN 250 MG PO TABS: ORAL_TABLET | ORAL | 0 refills | Status: DC

## 2017-11-22 MED ORDER — BENZONATATE 100 MG PO CAPS: 100.0000 mg | ORAL_CAPSULE | Freq: Two times a day (BID) | ORAL | 0 refills | Status: DC | PRN

## 2017-11-22 MED FILL — BENZONATATE 100 MG CAPS: 100 | 8 days supply | Qty: 30 | Fill #0

## 2017-11-22 MED FILL — AZITHROMYCIN 250 MG TABLET: 250 | 5 days supply | Qty: 6 | Fill #0

## 2017-11-22 MED FILL — FLUCONAZOLE 150 MG TABLET: 150 | 3 days supply | Qty: 2 | Fill #0

## 2017-11-22 MED FILL — predniSONE 5 MG TABS: 5 | 6 days supply | Qty: 21 | Fill #0

## 2017-11-22 NOTE — Progress Notes (Addendum)
[place] Complaint  Patient presents with  . Cough    X 2 WEEKS  . Sinus Problem    X 2 WEEKS      ICD-10-CM   1. Bacterial pharyngitis J02.8 azithromycin (ZITHROMAX) 250 MG tablet   B96.89 benzonatate (TESSALON) 100 MG capsule    predniSONE (DELTASONE) 5 MG tablet    fluconazole (DIFLUCAN) 150 MG tablet    Patient Active Problem List   Diagnosis Date Noted  . Vitamin D deficiency 08/30/2017  . 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 Class 1 obesity with serious comorbidity and body mass index (BMI) of 30.0 to 30.9 in adult 08/30/2017  . Class 1 obesity with serious comorbidity and body mass index (BMI) of 31.0 to 31.9 in adult 07/01/2017  . Other fatigue 07/01/2017  . Shortness of breath on exertion 07/01/2017  . Depression screening 07/01/2017  . Other hyperlipidemia 07/01/2017  . Essential hypertension 07/01/2017  . Hand, foot and mouth disease 07/30/2015  . Spontaneous tension pneumothorax 07/11/2015  . Allergic rhinitis 06/25/2015  . Rash 06/25/2015  . Lumbar strain 02/12/2015  . 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 Anemia 03/21/2014  . Migraine 11/18/2012  . General medical examination 02/29/2012  . Anxiety and depression 02/29/2012  . ESSENTIAL HYPERTENSION, BENIGN 11/15/2009  . DYSLIPIDEMIA 10/12/2008    Past Medical History:  Diagnosis Date  . Anxiety   . AVM (arteriovenous malformation)    leaky  . Back pain   . Hypertension   . Joint pain   . Migraines     Past Surgical History:  Procedure Laterality Date  . CESAREAN SECTION    . CRANIOTOMY  2008   left frontal AVM  . emobilization      Allergies  Allergen Reactions  . Codeine     REACTION: upset stomach once, but has taken it other times and been ok  . Lisinopril     cough  . Sulfonamide Derivatives Hives    BP 110/70 (BP Location: Right Arm, Patient Position: Sitting, Cuff Size: Normal)   Pulse 94   Temp 97.6 F (36.4 C) (Oral)   Resp 16   Wt 172 lb 9.6 oz (78.3 kg)   SpO2 97%    BMI 29.63 kg/m   Review of Systems  Respiratory: Positive for cough, sputum production, shortness of breath and wheezing.   All other systems reviewed and are negative.  Physical Exam  Constitutional: She appears well-developed and well-nourished.  HENT:  Head: Normocephalic and atraumatic.  Eyes: Conjunctivae are normal.  Neck: Normal range of motion.  Cardiovascular: Normal rate, regular rhythm and normal heart sounds.  Pulmonary/Chest: Effort normal and breath sounds normal.  Abdominal: Soft. Bowel sounds are normal.  Musculoskeletal: Normal range of motion.  Neurological: She is alert.  Skin: Skin is warm and dry. Capillary refill takes less than 2 seconds.  Psychiatric: She has a normal mood and affect.    No results found for this or any previous visit (from the past 72 hour(s)).   Current Outpatient Medications:  .  Aspirin-Acetaminophen-Caffeine (EXCEDRIN MIGRAINE PO), Take 2 tablets by mouth 2 (two) times daily as needed., Disp: , Rfl:  .  azelastine (ASTELIN) 0.1 % nasal spray, Place 1 spray into both nostrils daily. Use in each nostril as directed, Disp: 30 mL, Rfl: 12 .  azelastine (OPTIVAR) 0.05 % ophthalmic solution, 1 drop 2 (two) times daily as needed. , Disp: 6 mL, Rfl: 12 .  CARTIA XT 120 MG 24 hr capsule, TAKE  1 CAPSULE BY MOUTH DAILY., Disp: 90 capsule, Rfl: 1 .  fluticasone (FLONASE) 50 MCG/ACT nasal spray, 2 sprays into each nostril daily, Disp: 16 g, Rfl: 2 .  guaiFENesin (MUCINEX) 600 MG 12 hr tablet, Take by mouth 2 (two) times daily., Disp: , Rfl:  .  ibuprofen (ADVIL,MOTRIN) 600 MG tablet, Take 600 mg by mouth every 6 (six) hours as needed., Disp: , Rfl:  .  levocetirizine (XYZAL) 5 MG tablet, Take 1 tablet (5 mg total) by mouth every evening., Disp: 90 tablet, Rfl: 1 .  montelukast (SINGULAIR) 10 MG tablet, Take 1 tablet (10 mg total) by mouth at bedtime., Disp: 90 tablet, Rfl: 1 .  naproxen (NAPROXEN DR) 500 MG EC tablet, Take 1 tablet (500 mg total) by  mouth 2 (two) times daily with a meal., Disp: 30 tablet, Rfl: 0 .  rizatriptan (MAXALT) 10 MG tablet, Take 1 tablet (10 mg total) once as needed by mouth for migraine. May repeat in 2 hours if needed, Disp: 30 tablet, Rfl: 2 .  topiramate (TOPAMAX) 25 MG tablet, Take 1 tablet (25 mg total) 2 (two) times daily by mouth., Disp: 120 tablet, Rfl: 3 .  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 Pt is alert/oriented x4, calm, cooperative, and appropriate to situation. Pt reports 2 week duration of sinus congestion as well as coughing up green/yellow mucus the past 7 days, worsening, with some shortness of breath and coughing at night with difficulty sleeping.   Assessment: Bacterial pharyngitis  Plan:  -Z pack -Sterapred 5mg  dose pack -Tessalon 100mg  , take 1-2 q8h prn cough -Diflucan 150mg  x 1 dose   Benjamine Mola, FNP 11/22/2017 9:52 AM

## 2017-11-25 ENCOUNTER — Telehealth: Payer: Self-pay

## 2017-11-25 NOTE — Telephone Encounter (Signed)
Tired calling pt to see how she was feeling since her visit this week. Pt vm is full and I was not able to leave a message.

## 2017-11-25 NOTE — Telephone Encounter (Signed)
Pt returned call and stated she is doing fine.  °

## 2017-12-19 ENCOUNTER — Ambulatory Visit (INDEPENDENT_AMBULATORY_CARE_PROVIDER_SITE_OTHER): Payer: Self-pay | Admitting: Emergency Medicine

## 2017-12-19 VITALS — BP 110/82 | HR 105 | Temp 98.8°F | Resp 18

## 2017-12-19 DIAGNOSIS — R52 Pain, unspecified: Secondary | ICD-10-CM

## 2017-12-19 DIAGNOSIS — B349 Viral infection, unspecified: Secondary | ICD-10-CM

## 2017-12-19 LAB — POCT INFLUENZA A/B
Influenza A, POC: NEGATIVE
Influenza B, POC: NEGATIVE

## 2017-12-19 NOTE — Patient Instructions (Signed)

## 2017-12-19 NOTE — Progress Notes (Signed)
Subjective:     Kaitlin Foster is a 37 y.o. female who presents for evaluation of influenza like symptoms. Symptoms include chills, headache, myalgias and fever and have been present for 2 days. She has tried to alleviate the symptoms with acetaminophen with moderate relief. High risk factors for influenza. 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  The following portions of the patient's history were reviewed and updated as appropriate: allergies and current medications.  Review of Systems Pertinent items are noted in HPI.     Objective:      Vitals:   12/19/17 1011  BP: 110/82  Pulse: (!) 105  Resp: 18  Temp: 98.8 F (37.1 C)  SpO2: 98%   Physical Exam  Constitutional: She appears well-developed and well-nourished. No distress.  HENT:  Head: Normocephalic and atraumatic.  Right Ear: Tympanic membrane and external ear normal.  Left Ear: Tympanic membrane and external ear normal.  Mouth/Throat: Uvula is midline and oropharynx is clear and moist. No posterior oropharyngeal edema or posterior oropharyngeal erythema. No tonsillar exudate.  Eyes: Conjunctivae are normal.  Neck: Neck supple.  Cardiovascular: Normal rate and regular rhythm.  Pulmonary/Chest: Effort normal and breath sounds normal.  Lymphadenopathy:    She has cervical adenopathy.  Neurological: She is alert.  Skin: Skin is warm. Capillary refill takes less than 2 seconds. She is not diaphoretic.  Nursing note and vitals reviewed.     Assessment:    URI    Plan:    Supportive care with appropriate antipyretics and fluids. Educational material distributed and questions answered. Flu (-), rest, follow up as needed

## 2017-12-24 ENCOUNTER — Encounter: Payer: Self-pay | Admitting: Family

## 2017-12-24 ENCOUNTER — Ambulatory Visit (INDEPENDENT_AMBULATORY_CARE_PROVIDER_SITE_OTHER): Payer: No Typology Code available for payment source | Admitting: Family

## 2017-12-24 VITALS — BP 124/89 | HR 84 | Temp 98.1°F | Resp 16 | Ht 64.0 in | Wt 168.4 lb

## 2017-12-24 DIAGNOSIS — E663 Overweight: Secondary | ICD-10-CM

## 2017-12-24 DIAGNOSIS — I10 Essential (primary) hypertension: Secondary | ICD-10-CM

## 2017-12-24 DIAGNOSIS — J309 Allergic rhinitis, unspecified: Secondary | ICD-10-CM

## 2017-12-24 DIAGNOSIS — J069 Acute upper respiratory infection, unspecified: Secondary | ICD-10-CM

## 2017-12-24 MED ORDER — DILTIAZEM HCL ER COATED BEADS 120 MG PO CP24: 120.0000 mg | ORAL_CAPSULE | Freq: Every day | ORAL | 1 refills | Status: DC

## 2017-12-24 NOTE — Progress Notes (Signed)
Subjective:    Patient ID: [name], female    DOB: Sep 08, 1981, 37 y.o.   MRN: [id number]  HPI  Wt Readings from Last 3 Encounters:  12/24/17 168 lb 6.4 oz (76.4 kg)  11/22/17 172 lb 9.6 oz (78.3 kg)  10/26/17 161 lb (73 kg)   Reports that she had flu like symptoms on Sunday and [facility]-  Flu swab negative.    Maintaiend on cartia.   BP Readings from Last 3 Encounters:  12/24/17 124/89  12/19/17 110/82  11/22/17 110/70   Allergies- using singulair/xyzal. This helps with congestion.      Review of Systems See HPI Past Medical History:  Diagnosis Date  . Anxiety   . AVM (arteriovenous malformation)    leaky  . Back pain   . Hypertension   . Joint pain   . Migraines      Social History   Socioeconomic History  . Marital status: Married    Spouse name: Thad  . Number of children: 1  . Years of education: Not on file  . Highest education level: Not on file  Social Needs  . Financial resource strain: Not on file  . Food insecurity - worry: Not on file  . Food insecurity - inability: Not on file  . Transportation needs - medical: Not on file  . Transportation needs - non-medical: Not on file  Occupational History  . Occupation: Therapist, sports  Tobacco Use  . Smoking status: Never Smoker  . Smokeless tobacco: Never Used  Substance and Sexual Activity  . Alcohol use: No    Alcohol/week: 0.0 oz  . Drug use: No  . Sexual activity: Yes    Birth control/protection: Condom  Other Topics Concern  . 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 CESAREAN SECTION    . CRANIOTOMY  2008   left frontal AVM  . emobilization      Family History  Problem Relation Age of Onset  . Hypertension Father   . Alcohol abuse Father   . Arthritis Father   . Obesity Father    . Hypertension Mother   . Arthritis Mother   . Migraines Mother   . Obesity Mother   . Migraines Sister   . Ulcerative colitis Sister   . Migraines Maternal Aunt   . ADD / ADHD Son   . Cancer Neg Hx   . Heart disease Neg Hx     Allergies  Allergen Reactions  . Codeine     REACTION: upset stomach once, but has taken it other times and been ok  . Lisinopril     cough  . Sulfonamide Derivatives Hives    Current Outpatient Medications on File Prior to Visit  Medication Sig Dispense Refill  . Aspirin-Acetaminophen-Caffeine (EXCEDRIN MIGRAINE PO) Take 2 tablets by mouth 2 (two) times daily as needed.    [name] azelastine (ASTELIN) 0.1 % nasal spray Place 1 spray into both nostrils daily. Use in each nostril as directed 30 mL 12  . azelastine (OPTIVAR) 0.05 % ophthalmic solution 1 drop 2 (two) times daily as needed.  6 mL 12  . CARTIA XT 120 MG 24 hr capsule TAKE 1 CAPSULE  BY MOUTH DAILY. 90 capsule 1  . fluticasone (FLONASE) 50 MCG/ACT nasal spray 2 sprays into each nostril daily 16 g 2  . ibuprofen (ADVIL,MOTRIN) 600 MG tablet Take 600 mg by mouth every 6 (six) hours as needed.    [name] levocetirizine (XYZAL) 5 MG tablet Take 1 tablet (5 mg total) by mouth every evening. 90 tablet 1  . montelukast (SINGULAIR) 10 MG tablet Take 1 tablet (10 mg total) by mouth at bedtime. 90 tablet 1  . naproxen (NAPROXEN DR) 500 MG EC tablet Take 1 tablet (500 mg total) by mouth 2 (two) times daily with a meal. 30 tablet 0  . rizatriptan (MAXALT) 10 MG tablet Take 1 tablet (10 mg total) once as needed by mouth for migraine. May repeat in 2 hours if needed 30 tablet 2  . topiramate (TOPAMAX) 25 MG tablet Take 1 tablet (25 mg total) 2 (two) times daily by mouth. 120 tablet 3  . Vitamin D, Ergocalciferol, (DRISDOL) 50000 units CAPS capsule Take 1 capsule (50,000 Units total) by mouth every 7 (seven) days. 4 capsule 0   No current facility-administered medications on file prior to visit.     BP 124/89 (BP  Location: Left Arm, Cuff Size: Normal)   Pulse 84   Temp 98.1 F (36.7 C) (Oral)   Resp 16   Ht 5\' 4"  (1.626 m)   Wt 168 lb 6.4 oz (76.4 kg)   LMP 12/13/2017   SpO2 100%   BMI 28.91 kg/m       Objective:   Physical Exam  Constitutional: She is oriented to person, place, and time. She appears well-developed and well-nourished.  HENT:  Head: Normocephalic and atraumatic.  Mouth/Throat: No oropharyngeal exudate, posterior oropharyngeal edema or posterior oropharyngeal erythema.  Eyes: No scleral icterus.  Neck:  Mild cervical LAD.   Cardiovascular: Normal rate, regular rhythm and normal heart sounds.  No murmur heard. Pulmonary/Chest: Effort normal and breath sounds normal. No respiratory distress. She has no wheezes.  Musculoskeletal: She exhibits no edema.  Neurological: She is alert and oriented to person, place, and time.  Psychiatric: She has a normal mood and affect. Her behavior is normal. Judgment and thought content normal.          Assessment & Plan:  Hypertension-well controlled on Cartia.  Continue same.  Overweight-we discussed weight loss.  She is working on [employer].  We discussed considering counseling to discuss her stress eating.  Allergic rhinitis-reports this is stable on current meds.  Continue same.  Viral URI-mild advised patient to let us know if symptoms do not continue to improve.

## 2018-02-09 ENCOUNTER — Other Ambulatory Visit: Payer: Self-pay | Admitting: Family

## 2018-02-09 MED FILL — LEVOCETIRIZINE 5 MG TABLET: 5 | 90 days supply | Qty: 90 | Fill #1

## 2018-02-09 MED FILL — DILTIAZEM 24HR ER 120 MG CA: 120 | 90 days supply | Qty: 90 | Fill #0

## 2018-02-09 MED FILL — MONTELUKAST SOD 10 MG TAB: 10 | 90 days supply | Qty: 90 | Fill #0

## 2018-02-09 MED FILL — TOPIRAMATE 25 MG TAB: 25 | 60 days supply | Qty: 120 | Fill #1

## 2018-03-31 ENCOUNTER — Encounter: Payer: Self-pay | Admitting: Family

## 2018-03-31 DIAGNOSIS — G43909 Migraine, unspecified, not intractable, without status migrainosus: Secondary | ICD-10-CM

## 2018-04-01 ENCOUNTER — Encounter (INDEPENDENT_AMBULATORY_CARE_PROVIDER_SITE_OTHER): Payer: Self-pay | Admitting: Family Medicine

## 2018-04-05 ENCOUNTER — Telehealth: Payer: Self-pay | Admitting: Neurology

## 2018-04-05 NOTE — Telephone Encounter (Signed)
That's fine with me thanks 

## 2018-04-05 NOTE — Telephone Encounter (Signed)
We received a referral on pt for migraines. she has seen Dr. Leonie Man in the past but is req to see Dr. Jaynee Eagles for this. Are you both ok w/ this?

## 2018-04-08 NOTE — Telephone Encounter (Signed)
yes

## 2018-05-21 ENCOUNTER — Ambulatory Visit (INDEPENDENT_AMBULATORY_CARE_PROVIDER_SITE_OTHER): Payer: Self-pay | Admitting: Family

## 2018-05-21 ENCOUNTER — Encounter: Payer: Self-pay | Admitting: Family

## 2018-05-21 VITALS — BP 125/85 | HR 87 | Temp 98.6°F | Resp 16 | Wt 178.0 lb

## 2018-05-21 DIAGNOSIS — H6691 Otitis media, unspecified, right ear: Secondary | ICD-10-CM

## 2018-05-21 DIAGNOSIS — J069 Acute upper respiratory infection, unspecified: Secondary | ICD-10-CM

## 2018-05-21 MED ORDER — AMOXICILLIN-POT CLAVULANATE 875-125 MG PO TABS: 1.0000 | ORAL_TABLET | Freq: Two times a day (BID) | ORAL | 0 refills | Status: DC

## 2018-05-21 NOTE — Progress Notes (Signed)
Subjective:     Patient ID: Kaitlin Foster, female   DOB: [birth year], 37 y.o.   MRN: [id number]  HPI 37 year old female is in today with c/o cough, sore throat, congestion x 4 days and worsening. She developed right ear pain today and is concerned for infection. Takes Xyzal and Flonase daily for seasonal allergies  Review of Systems  Constitutional: Positive for chills, fatigue and fever.  HENT: Positive for congestion, ear pain, postnasal drip, sinus pressure, sinus pain and sore throat.   Eyes: Negative.   Respiratory: Positive for cough. Negative for wheezing.   Cardiovascular: Negative.   Endocrine: Negative.   Musculoskeletal: Negative.   Allergic/Immunologic: Positive for environmental allergies.  Hematological: Negative.   Psychiatric/Behavioral: Negative.    Past Medical History:  Diagnosis Date  . Anxiety   . AVM (arteriovenous malformation)    leaky  . Back pain   . Hypertension   . Joint pain   . Migraines     Social History   Socioeconomic History  . Marital status: Married    Spouse name: Thad  . Number of children: 1  . Years of education: Not on file  . Highest education level: Not on file  Occupational History  . Occupation: Therapist, sports  Social Needs  . Financial resource strain: Not on file  . Food insecurity:    Worry: Not on file    Inability: Not on file  . Transportation needs:    Medical: Not on file    Non-medical: Not on file  Tobacco Use  . Smoking status: Never Smoker  . Smokeless tobacco: Never Used  Substance and Sexual Activity  . Alcohol use: No    Alcohol/week: 0.0 oz  . Drug use: No  . Sexual activity: Yes    Birth control/protection: Condom  Lifestyle  . Physical activity:    Days per week: Not on file    Minutes per session: Not on file  . Stress: Not on file  Relationships  . Social connections:    Talks on phone: Not on file    Gets together: Not on file    Attends religious service: Not on file    Active member of club or  organization: Not on file    Attends meetings of clubs or organizations: Not on file    Relationship status: Not on file  . Intimate partner violence:    Fear of current or ex partner: Not on file    Emotionally abused: Not on file    Physically abused: Not on file    Forced sexual activity: Not on file  Other Topics Concern  . 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 CESAREAN SECTION    . CRANIOTOMY  2008   left frontal AVM  . emobilization      Family History  Problem Relation Age of Onset  . Hypertension Father   . Alcohol abuse Father   . Arthritis Father   . Obesity Father   . Hypertension Mother   . Arthritis Mother   . Migraines Mother   . Obesity Mother   . Migraines Sister   . Ulcerative colitis Sister   . Migraines Maternal Aunt   .  ADD / ADHD Son   . Cancer Neg Hx   . Heart disease Neg Hx     Allergies  Allergen Reactions  . Codeine     REACTION: upset stomach once, but has taken it other times and been ok  . Lisinopril     cough  . Sulfonamide Derivatives Hives    Current Outpatient Medications on File Prior to Visit  Medication Sig Dispense Refill  . Aspirin-Acetaminophen-Caffeine (EXCEDRIN MIGRAINE PO) Take 2 tablets by mouth 2 (two) times daily as needed.    [name] azelastine (ASTELIN) 0.1 % nasal spray Place 1 spray into both nostrils daily. Use in each nostril as directed 30 mL 12  . azelastine (OPTIVAR) 0.05 % ophthalmic solution 1 drop 2 (two) times daily as needed.  6 mL 12  . diltiazem (CARTIA XT) 120 MG 24 hr capsule Take 1 capsule (120 mg total) by mouth daily. 90 capsule 1  . fluticasone (FLONASE) 50 MCG/ACT nasal spray 2 sprays into each nostril daily 16 g 2  . ibuprofen (ADVIL,MOTRIN) 600 MG tablet Take 600 mg by mouth every 6 (six) hours as  needed.    [name] levocetirizine (XYZAL) 5 MG tablet Take 1 tablet (5 mg total) by mouth every evening. 90 tablet 1  . montelukast (SINGULAIR) 10 MG tablet TAKE 1 TABLET (10 MG TOTAL) BY MOUTH AT BEDTIME. 90 tablet 1  . rizatriptan (MAXALT) 10 MG tablet Take 1 tablet (10 mg total) once as needed by mouth for migraine. May repeat in 2 hours if needed 30 tablet 2  . topiramate (TOPAMAX) 25 MG tablet Take 1 tablet (25 mg total) 2 (two) times daily by mouth. 120 tablet 3   No current facility-administered medications on file prior to visit.     BP 125/85 (BP Location: Right Arm, Patient Position: Sitting, Cuff Size: Normal)   Pulse 87   Temp 98.6 F (37 C) (Oral)   Resp 16   Wt 178 lb (80.7 kg)   SpO2 97%   BMI 30.55 kg/m chart    Objective:   Physical Exam  Constitutional: She is oriented to person, place, and time. She appears well-developed and well-nourished.  HENT:  Head: Normocephalic.  Right Ear: Hearing and ear canal normal.  Left Ear: Tympanic membrane normal.  Mouth/Throat: Oropharynx is clear and moist and mucous membranes are normal.  Right red, bulging TM  Cardiovascular: Normal rate, regular rhythm and normal heart sounds.  Pulmonary/Chest: Effort normal and breath sounds normal.  Neurological: She is alert and oriented to person, place, and time.  Skin: Skin is warm and dry.       Assessment:     Kaitlin Foster was seen today for ear pain, cough and sore throat.  Diagnoses and all orders for this visit:  Acute otitis media, right  Upper respiratory tract infection, unspecified type  Other orders -     amoxicillin-clavulanate (AUGMENTIN) 875-125 MG tablet; Take 1 tablet by mouth 2 (two) times daily.      Plan:     Call with any questions or concerns. Recheck as needed

## 2018-05-21 NOTE — Patient Instructions (Signed)
Otitis Media, Adult Otitis media is redness, soreness, and puffiness (swelling) in the space just behind your eardrum (middle ear). It may be caused by allergies or infection. It often happens along with a cold. Follow these instructions at home:  Take your medicine as told. Finish it even if you start to feel better.  Only take over-the-counter or prescription medicines for pain, discomfort, or fever as told by your doctor.  Follow up with your doctor as told. Contact a doctor if:  You have otitis media only in one ear, or bleeding from your nose, or both.  You notice a lump on your neck.  You are not getting better in 3-5 days.  You feel worse instead of better. Get help right away if:  You have pain that is not helped with medicine.  You have puffiness, redness, or pain around your ear.  You get a stiff neck.  You cannot move part of your face (paralysis).  You notice that the bone behind your ear hurts when you touch it. This information is not intended to replace advice given to you by your health care provider. Make sure you discuss any questions you have with your health care provider. Document Released: 04/13/2008 Document Revised: 04/02/2016 Document Reviewed: 05/23/2013 Elsevier Interactive Patient Education  2017 [employer]  

## 2018-05-23 ENCOUNTER — Other Ambulatory Visit: Payer: Self-pay | Admitting: Family

## 2018-05-23 MED FILL — CARTIA XT 120 MG CAPSULE SA: 120 | 90 days supply | Qty: 90 | Fill #1

## 2018-05-24 ENCOUNTER — Encounter: Payer: Self-pay | Admitting: Family Medicine

## 2018-05-24 ENCOUNTER — Ambulatory Visit (INDEPENDENT_AMBULATORY_CARE_PROVIDER_SITE_OTHER): Payer: No Typology Code available for payment source | Admitting: Family Medicine

## 2018-05-24 VITALS — BP 129/74 | HR 80 | Temp 98.3°F | Resp 16 | Ht 64.0 in | Wt 175.8 lb

## 2018-05-24 DIAGNOSIS — J324 Chronic pansinusitis: Secondary | ICD-10-CM | POA: Diagnosis not present

## 2018-05-24 DIAGNOSIS — H6501 Acute serous otitis media, right ear: Secondary | ICD-10-CM | POA: Diagnosis not present

## 2018-05-24 MED ORDER — METHYLPREDNISOLONE ACETATE 80 MG/ML IJ SUSP
80.0000 mg | Freq: Once | INTRAMUSCULAR | Status: AC
  Administered 2018-05-24: 80 mg via INTRAMUSCULAR

## 2018-05-24 MED ORDER — LEVOFLOXACIN 500 MG PO TABS
500.0000 mg | ORAL_TABLET | Freq: Every day | ORAL | 0 refills | Status: DC
Start: 2018-05-24 — End: 2018-06-13

## 2018-05-24 MED ORDER — FLUCONAZOLE 150 MG PO TABS: 150.0000 mg | ORAL_TABLET | Freq: Once | ORAL | 2 refills | Status: AC

## 2018-05-24 MED ORDER — PREDNISONE 10 MG PO TABS: ORAL_TABLET | ORAL | 0 refills | Status: DC

## 2018-05-24 MED FILL — predniSONE 10 MG TABS: 10 | 12 days supply | Qty: 20 | Fill #0

## 2018-05-24 MED FILL — FLUCONAZOLE 150 MG TABS: 150 | 4 days supply | Qty: 2 | Fill #0

## 2018-05-24 MED FILL — levoFLOXacin 500 MG TABS: 500 | 7 days supply | Qty: 7 | Fill #0

## 2018-05-24 MED FILL — LEVOCETIRIZINE 5 MG TABLET: 5 | 90 days supply | Qty: 90 | Fill #0

## 2018-05-24 NOTE — Progress Notes (Signed)
Patient ID: Kaitlin Foster, female   DOB: 11/27/1980, 37 y.o.   MRN: [id number]     Subjective:  I acted as a Education administrator for Dr. Carollee Herter.  Kaitlin Foster, Kaitlin Foster   Patient ID: Kaitlin Foster, female    DOB: Jul 30, 1981, 37 y.o.   MRN: [id number]  Chief Complaint  Patient presents with  . Otalgia    right ear    HPI  Patient is in today for follow up right ear infection.  She went to [place] on Saturday.  They started her on Augmentin.  She is having excruciating pain at night and pressure.  Patient Care Team: [name], NP as PCP - General (Internal Medicine) Brien Few, MD as Attending Physician (Obstetrics and Gynecology)   Past Medical History:  Diagnosis Date  . Anxiety   . AVM (arteriovenous malformation)    leaky  . Back pain   . Hypertension   . Joint pain   . Migraines     Past Surgical History:  Procedure Laterality Date  . CESAREAN SECTION    . CRANIOTOMY  2008   left frontal AVM  . emobilization      Family History  Problem Relation Age of Onset  . Hypertension Father   . Alcohol abuse Father   . Arthritis Father   . Obesity Father   . Hypertension Mother   . Arthritis Mother   . Migraines Mother   . Obesity Mother   . Migraines Sister   . Ulcerative colitis Sister   . Migraines Maternal Aunt   . ADD / ADHD Son   . Cancer Neg Hx   . Heart disease Neg Hx     Social History   Socioeconomic History  . Marital status: Married    Spouse name: Kaitlin Foster  . Number of children: 1  . Years of education: Not on file  . Highest education level: Not on file  Occupational History  . Occupation: Therapist, sports  Social Needs  . Financial resource strain: Not on file  . Food insecurity:    Worry: Not on file    Inability: Not on file  . Transportation needs:    Medical: Not on file    Non-medical: Not on file  Tobacco Use  . Smoking status: Never Smoker  . Smokeless tobacco: Never Used  Substance and Sexual Activity  . Alcohol use: No    Alcohol/week:  0.0 oz  . Drug use: No  . Sexual activity: Yes    Birth control/protection: Condom  Lifestyle  . Physical activity:    Days per week: Not on file    Minutes per session: Not on file  . Stress: Not on file  Relationships  . Social connections:    Talks on phone: Not on file    Gets together: Not on file    Attends religious service: Not on file    Active member of club or organization: Not on file    Attends meetings of clubs or organizations: Not on file    Relationship status: Not on file  . Intimate partner violence:    Fear of current or ex partner: Not on file    Emotionally abused: Not on file    Physically abused: Not on file    Forced sexual activity: Not on file  Other Topics Concern  . 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 Aspirin-Acetaminophen-Caffeine (EXCEDRIN MIGRAINE PO) Take 2 tablets by mouth 2 (two) times daily as needed.    [name] azelastine (ASTELIN) 0.1 % nasal spray Place 1 spray into both nostrils daily. Use in each nostril as directed 30 mL 12  . azelastine (OPTIVAR) 0.05 % ophthalmic solution 1 drop 2 (two) times daily as needed.  6 mL 12  . diltiazem (CARTIA XT) 120 MG 24 hr capsule Take 1 capsule (120 mg total) by mouth daily. 90 capsule 1  . fluticasone (FLONASE) 50 MCG/ACT nasal spray 2 sprays into each nostril daily 16 g 2  . ibuprofen (ADVIL,MOTRIN) 600 MG tablet Take 600 mg by mouth every 6 (six) hours as needed.    [name] levocetirizine (XYZAL) 5 MG tablet TAKE 1 TABLET BY MOUTH EVERY EVENING. 90 tablet 1  . montelukast (SINGULAIR) 10 MG tablet TAKE 1 TABLET (10 MG TOTAL) BY MOUTH AT BEDTIME. 90 tablet 1  . rizatriptan (MAXALT) 10 MG tablet Take 1 tablet (10 mg total) once as needed by mouth for migraine. May repeat in 2 hours if needed  30 tablet 2  . topiramate (TOPAMAX) 25 MG tablet Take 1 tablet (25 mg total) 2 (two) times daily by mouth. 120 tablet 3  . amoxicillin-clavulanate (AUGMENTIN) 875-125 MG tablet Take 1 tablet by mouth 2 (two) times daily. 14 tablet 0   No facility-administered medications prior to visit.     Allergies  Allergen Reactions  . Codeine     REACTION: upset stomach once, but has taken it other times and been ok  . Lisinopril     cough  . Sulfonamide Derivatives Hives    Review of Systems  Constitutional: Negative for fever and malaise/fatigue.  HENT: Positive for ear pain (right). Negative for congestion.   Eyes: Negative for blurred vision.  Respiratory: Negative for cough and shortness of breath.   Cardiovascular: Negative for chest pain, palpitations and leg swelling.  Gastrointestinal: Negative for vomiting.  Musculoskeletal: Negative for back pain.  Skin: Negative for rash.  Neurological: Negative for loss of consciousness and headaches.       Objective:    Physical Exam  Constitutional: She is oriented to person, place, and time. She appears well-developed and well-nourished.  HENT:  Head: Normocephalic and atraumatic.  Right Ear: There is tenderness. Tympanic membrane is bulging. A middle ear effusion is present.  Left Ear: Tympanic membrane, external ear and ear canal normal.  Eyes: Conjunctivae and EOM are normal.  Neck: Normal range of motion. Neck supple. No JVD present. Carotid bruit is not present. No thyromegaly present.  Cardiovascular: Normal rate, regular rhythm and normal heart sounds.  No murmur heard. Pulmonary/Chest: Effort normal and breath sounds normal. No respiratory distress. She has no wheezes. She has no rales. She exhibits no tenderness.  Musculoskeletal: She exhibits no edema.  Neurological: She is alert and oriented to person, place, and time.  Psychiatric: She has a normal mood and affect.  Nursing note and vitals reviewed.   BP 129/74 (BP  Location: Left Arm, Cuff Size: Normal)   Pulse 80   Temp 98.3 F (36.8 C) (Oral)   Resp 16   Ht 5\' 4"  (1.626 m)   Wt 175 lb 12.8 oz (79.7 kg)   LMP 05/17/2018   SpO2 100%   BMI 30.18 kg/m  Wt Readings from Last 3 Encounters:  05/24/18 175 lb 12.8 oz (79.7 kg)  05/21/18 178 lb (80.7 kg)  12/24/17 168 lb 6.4 oz (76.4 kg)   BP Readings from Last 3 Encounters:  05/24/18 129/74  05/21/18 125/85  12/24/17 124/89     Immunization History  Administered Date(s) Administered  . Influenza Split 09/10/2011, 08/09/2012  . Influenza,inj,Quad PF,6+ Mos 08/09/2016  . Influenza-Unspecified 08/09/2013, 07/30/2015  . Tdap 11/09/2002, 03/20/2014    Health Maintenance  Topic Date Due  . PAP SMEAR  02/07/2018  . HIV Screening  06/23/2018 (Originally 10/15/1997)  . INFLUENZA VACCINE  06/09/2018  . TETANUS/TDAP  03/20/2024    Lab Results  Component Value Date   WBC 4.4 10/26/2017   HGB 13.9 10/26/2017   HCT 42.4 10/26/2017   PLT 251.0 06/23/2017   GLUCOSE 82 10/26/2017   CHOL 140 06/23/2017   TRIG 111.0 06/23/2017   HDL 32.10 (L) 06/23/2017   LDLCALC 86 06/23/2017   ALT 14 10/26/2017   AST 20 10/26/2017   NA 144 10/26/2017   K 4.1 10/26/2017   CL 106 10/26/2017   CREATININE 0.95 10/26/2017   BUN 17 10/26/2017   CO2 24 10/26/2017   TSH 1.08 06/23/2017   INR 1.1 06/05/2007   HGBA1C 5.0 10/26/2017    Lab Results  Component Value Date   TSH 1.08 06/23/2017   Lab Results  Component Value Date   WBC 4.4 10/26/2017   HGB 13.9 10/26/2017   HCT 42.4 10/26/2017   MCV 90 10/26/2017   PLT 251.0 06/23/2017   Lab Results  Component Value Date   NA 144 10/26/2017   K 4.1 10/26/2017   CO2 24 10/26/2017   GLUCOSE 82 10/26/2017   BUN 17 10/26/2017   CREATININE 0.95 10/26/2017   BILITOT 0.4 10/26/2017   ALKPHOS 80 10/26/2017   AST 20 10/26/2017   ALT 14 10/26/2017   PROT 7.0 10/26/2017   ALBUMIN 4.4 10/26/2017   CALCIUM 9.5 10/26/2017   GFR 101.40 06/23/2017   Lab  Results  Component Value Date   CHOL 140 06/23/2017   Lab Results  Component Value Date   HDL 32.10 (L) 06/23/2017   Lab Results  Component Value Date   LDLCALC 86 06/23/2017   Lab Results  Component Value Date   TRIG 111.0 06/23/2017   Lab Results  Component Value Date   CHOLHDL 4 06/23/2017   Lab Results  Component Value Date   HGBA1C 5.0 10/26/2017         Assessment & Plan:   Problem List Items Addressed This Visit    None    Visit Diagnoses    Pansinusitis, unspecified chronicity    -  Primary   Relevant Medications   levofloxacin (LEVAQUIN) 500 MG tablet   predniSONE (DELTASONE) 10 MG tablet   fluconazole (DIFLUCAN) 150 MG tablet   methylPREDNISolone acetate (DEPO-MEDROL) injection 80 mg (Completed)      I have discontinued Brixton H. Modeste's amoxicillin-clavulanate. I am also having her start on levofloxacin, predniSONE, and fluconazole. Additionally, I am having her maintain her fluticasone, azelastine, azelastine, ibuprofen, Aspirin-Acetaminophen-Caffeine (EXCEDRIN MIGRAINE PO), topiramate, rizatriptan, diltiazem, montelukast, and levocetirizine. We administered methylPREDNISolone acetate.  Meds ordered this encounter  Medications  . levofloxacin (LEVAQUIN) 500 MG tablet    Sig: Take 1 tablet (500 mg total) by mouth daily.    Dispense:  7 tablet    Refill:  0  . predniSONE (DELTASONE) 10 MG tablet    Sig: TAKE  3 TABLETS PO QD FOR 3 DAYS THEN TAKE 2 TABLETS PO QD FOR 3 DAYS THEN TAKE 1 TABLET PO QD FOR 3 DAYS THEN TAKE 1/2 TAB PO QD FOR 3 DAYS    Dispense:  20 tablet    Refill:  0  . fluconazole (DIFLUCAN) 150 MG tablet    Sig: Take 1 tablet (150 mg total) by mouth once for 1 dose. May repeat in 3 days prn    Dispense:  2 tablet    Refill:  2  . methylPREDNISolone acetate (DEPO-MEDROL) injection 80 mg    CMA served as scribe during this visit. History, Physical and Plan performed by medical provider. Documentation and orders reviewed and attested  to.  Ann Held, DO

## 2018-05-24 NOTE — Assessment & Plan Note (Signed)
Abx, steroid nasal spay Antihistamine otc pred taper and depo medrol im

## 2018-05-26 ENCOUNTER — Telehealth: Payer: Self-pay | Admitting: Family

## 2018-05-26 DIAGNOSIS — H6690 Otitis media, unspecified, unspecified ear: Secondary | ICD-10-CM

## 2018-05-26 NOTE — Telephone Encounter (Signed)
Copied from [place] [phone] [id number]. Topic: Quick Communication - See Telephone Encounter >> May 26, 2018 10:30 AM Neva Seat wrote: Pt has been seen 2 or more times for ear infection.  However, her ear isn't getting better.  Pt is asking if getting a referral to an ENT will be the step. Please call pt back to discuss.

## 2018-06-13 ENCOUNTER — Ambulatory Visit (INDEPENDENT_AMBULATORY_CARE_PROVIDER_SITE_OTHER): Payer: No Typology Code available for payment source | Admitting: Neurology

## 2018-06-13 ENCOUNTER — Encounter: Payer: Self-pay | Admitting: Neurology

## 2018-06-13 ENCOUNTER — Telehealth: Payer: Self-pay | Admitting: Neurology

## 2018-06-13 ENCOUNTER — Encounter

## 2018-06-13 VITALS — BP 140/91 | HR 83 | Ht 64.0 in | Wt 183.2 lb

## 2018-06-13 DIAGNOSIS — R519 Headache, unspecified: Secondary | ICD-10-CM

## 2018-06-13 DIAGNOSIS — Q2730 Arteriovenous malformation, site unspecified: Secondary | ICD-10-CM | POA: Diagnosis not present

## 2018-06-13 DIAGNOSIS — R51 Headache with orthostatic component, not elsewhere classified: Secondary | ICD-10-CM

## 2018-06-13 DIAGNOSIS — G43711 Chronic migraine without aura, intractable, with status migrainosus: Secondary | ICD-10-CM | POA: Diagnosis not present

## 2018-06-13 DIAGNOSIS — G8929 Other chronic pain: Secondary | ICD-10-CM

## 2018-06-13 MED ORDER — KETOROLAC TROMETHAMINE 60 MG/2ML IM SOLN: 60.0000 mg | Freq: Once | INTRAMUSCULAR | Status: AC

## 2018-06-13 MED ORDER — BUPROPION HCL ER (SR) 150 MG PO TB12: 150.0000 mg | ORAL_TABLET | Freq: Two times a day (BID) | ORAL | 11 refills | Status: DC

## 2018-06-13 MED FILL — BUPROPION SR 150 MG TABLET: 150 | 30 days supply | Qty: 60 | Fill #0

## 2018-06-13 NOTE — Progress Notes (Signed)
[facility]    Provider:  Dr Jaynee Eagles Referring Provider: [name], NP Primary Care Physician:  [name], NP  CC:  Migraine, new referral for headache  HPI:  Kaitlin Foster is a 37 y.o. female here as a referral from Dr. Inda Castle for migranes. She has always had headaches, not diagnosed until her AVM. Numbness at the suture site. Migraies around her period. She has daily headaches. 5 or 6 are severe migraine days,  10 are moderately severe and she can push through. Ongoing for a decade at this frequency and severity. Migraines stay on the left, pulsating/pounding/throbbing.+photo/phonophobia,nausea no vomiting, movement makes it worse, a dark room helps. Headaches can be positional. She has tracked her food no food triggers. No medication overuse. No aura.   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 No evidence of recurrent AVM or new intracranial Abnormality.  Cbc/cmp: normal   Review of Systems: Patient complains of symptoms per HPI as well as the following symptoms: migraines. Pertinent negatives and positives per HPI. All others negative.   Social History   Socioeconomic History  . Marital status: Married    Spouse name: Thad  . Number of children: 1  . Years of education: Not on file  . Highest education level: Not on file  Occupational History  . Occupation: Therapist, sports  Social Needs  . Financial resource strain: Not on file  . Food insecurity:    Worry: Not on file    Inability: Not on file  . Transportation needs:    Medical: Not on file    Non-medical: Not on file  Tobacco Use  . Smoking status: Never Smoker  . Smokeless tobacco: Never Used  Substance and Sexual Activity  . Alcohol use: No    Alcohol/week: 0.0 oz    . Drug use: No  . Sexual activity: Yes    Birth control/protection: Condom  Lifestyle  . Physical activity:    Days per week: Not on file    Minutes per session: Not on file  . Stress: Not on file  Relationships  . Social connections:    Talks on phone: Not on file    Gets together: Not on file    Attends religious service: Not on file    Active member of club or organization: Not on file    Attends meetings of clubs or organizations: Not on file    Relationship status: Not on file  . Intimate partner violence:    Fear of current or ex partner: Not on file    Emotionally abused: Not on file    Physically abused: Not on file    Forced sexual activity: Not on file  Other Topics Concern  . 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 Hypertension Father   . Alcohol abuse Father   . Arthritis Father   . Obesity Father   . Hypertension Mother   . Arthritis Mother   . Migraines Mother   . Obesity Mother   . Migraines  Sister   . Ulcerative colitis Sister   . Migraines Maternal Aunt   . ADD / ADHD Son   . Cancer Neg Hx   . Heart disease Neg Hx     Past Medical History:  Diagnosis Date  . Anxiety   . AVM (arteriovenous malformation)    leaky  . Back pain   . Hypertension   . Joint pain   . Migraines     Past Surgical History:  Procedure Laterality Date  . CESAREAN SECTION    . CRANIOTOMY  2008   left frontal AVM  . emobilization      Current Outpatient Medications  Medication Sig Dispense Refill  . Aspirin-Acetaminophen-Caffeine (EXCEDRIN MIGRAINE PO) Take 2 tablets by mouth 2 (two) times daily as needed.    [name] azelastine (ASTELIN) 0.1 % nasal spray Place 1 spray into both nostrils daily. Use in each nostril as directed 30 mL 12  . azelastine (OPTIVAR) 0.05 %  ophthalmic solution 1 drop 2 (two) times daily as needed.  6 mL 12  . buPROPion (WELLBUTRIN SR) 150 MG 12 hr tablet Take 1 tablet (150 mg total) by mouth 2 (two) times daily. 60 tablet 11  . diltiazem (CARTIA XT) 120 MG 24 hr capsule Take 1 capsule (120 mg total) by mouth daily. 90 capsule 1  . fluticasone (FLONASE) 50 MCG/ACT nasal spray 2 sprays into each nostril daily 16 g 2  . ibuprofen (ADVIL,MOTRIN) 600 MG tablet Take 600 mg by mouth every 6 (six) hours as needed.    [name] levocetirizine (XYZAL) 5 MG tablet TAKE 1 TABLET BY MOUTH EVERY EVENING. 90 tablet 1  . levofloxacin (LEVAQUIN) 500 MG tablet Take 1 tablet (500 mg total) by mouth daily. 7 tablet 0  . montelukast (SINGULAIR) 10 MG tablet TAKE 1 TABLET (10 MG TOTAL) BY MOUTH AT BEDTIME. 90 tablet 1  . predniSONE (DELTASONE) 10 MG tablet TAKE 3 TABLETS PO QD FOR 3 DAYS THEN TAKE 2 TABLETS PO QD FOR 3 DAYS THEN TAKE 1 TABLET PO QD FOR 3 DAYS THEN TAKE 1/2 TAB PO QD FOR 3 DAYS 20 tablet 0  . rizatriptan (MAXALT) 10 MG tablet Take 1 tablet (10 mg total) once as needed by mouth for migraine. May repeat in 2 hours if needed 30 tablet 2  . topiramate (TOPAMAX) 25 MG tablet Take 1 tablet (25 mg total) 2 (two) times daily by mouth. 120 tablet 3   Current Facility-Administered Medications  Medication Dose Route Frequency Provider Last Rate Last Dose  . ketorolac (TORADOL) injection 60 mg  60 mg Intramuscular Once Melvenia Beam, MD        Allergies as of 06/13/2018 - Review Complete 05/24/2018  Allergen Reaction Noted  . Codeine  12/26/2010  . Lisinopril    . Sulfonamide derivatives Hives     Vitals: LMP 05/17/2018  Last Weight:  Wt Readings from Last 1 Encounters:  05/24/18 175 lb 12.8 oz (79.7 kg)   Last Height:   Ht Readings from Last 1 Encounters:  05/24/18 5\' 4"  (1.626 m)     Physical exam: Exam: Gen: NAD, conversant, well nourised, obese, well groomed                     CV: RRR, no MRG. No Carotid Bruits. No peripheral  edema, warm, nontender Eyes: Conjunctivae clear without exudates or hemorrhage  Neuro: Detailed Neurologic Exam  Speech:    Speech is normal; fluent and spontaneous with normal comprehension.  Cognition:    The patient is oriented to person, place, and time;     recent and remote memory intact;     language fluent;     normal attention, concentration,     fund of knowledge Cranial Nerves:    The pupils are equal, round, and reactive to light. The fundi are normal and spontaneous venous pulsations are present. Visual fields are full to finger confrontation. Extraocular movements are intact. Trigeminal sensation is intact and the muscles of mastication are normal. The face is symmetric. The palate elevates in the midline. Hearing intact. Voice is normal. Shoulder shrug is normal. The tongue has normal motion without fasciculations.   Coordination:    Normal finger to nose and heel to shin. Normal rapid alternating movements.   Gait:    Heel-toe and tandem gait are normal.   Motor Observation:    No asymmetry, no atrophy, and no involuntary movements noted. Tone:    Normal muscle tone.    Posture:    Posture is normal. normal erect    Strength:    Strength is V/V in the upper and lower limbs.      Sensation: intact to LT     Reflex Exam:  DTR's:    Deep tendon reflexes in the upper and lower extremities are normal bilaterally.   Toes:    The toes are downgoing bilaterally.   Clonus:    Clonus is absent.     Assessment/Plan:  Patient with intractable migraines  - botox for migraines - cgrp in the future if needed consider aimovig ** - start wellbutrin for migraine prevention MRI brain due to concerning symptoms of positional headaches, AVM, vision changes  to look for space occupying mass, chiari or intracranial hypertension (pseudotumor).  Orders Placed This Encounter  Procedures  . MR BRAIN W WO CONTRAST  . TSH  . CBC  . Comprehensive metabolic panel      Discussed: To prevent or relieve headaches, try the following: Cool Compress. Lie down and place a cool compress on your head.  Avoid headache triggers. If certain foods or odors seem to have triggered your migraines in the past, avoid them. A headache diary might help you identify triggers.  Include physical activity in your daily routine. Try a daily walk or other moderate aerobic exercise.  Manage stress. Find healthy ways to cope with the stressors, such as delegating tasks on your to-do list.  Practice relaxation techniques. Try deep breathing, yoga, massage and visualization.  Eat regularly. Eating regularly scheduled meals and maintaining a healthy diet might help prevent headaches. Also, drink plenty of fluids.  Follow a regular sleep schedule. Sleep deprivation might contribute to headaches Consider biofeedback. With this mind-body technique, you learn to control certain bodily functions - such as muscle tension, heart rate and blood pressure - to prevent headaches or reduce headache pain.    Proceed to emergency room if you experience new or worsening symptoms or symptoms do not resolve, if you have new neurologic symptoms or if headache is severe, or for any concerning symptom.   Provided education and documentation from American headache Society toolbox including articles on: chronic migraine medication overuse headache, chronic migraines, prevention of migraines, behavioral and other nonpharmacologic treatments for headache.     Sarina Ill, MD  [facility] [address] [place], [place] [address]  Phone [phone] Fax [phone]

## 2018-06-13 NOTE — Patient Instructions (Addendum)
Botox for migraines Aimovig Wellbutrin start once daily and can add second dose at 3pm if needed MRI brain w/wo contrast  Bupropion extended-release tablets (Depression/Mood Disorders) What is this medicine? BUPROPION (byoo PROE pee on) is used to treat depression. This medicine may be used for other purposes; ask your health care provider or pharmacist if you have questions. COMMON BRAND NAME(S): Aplenzin, Budeprion XL, Forfivo XL, Wellbutrin XL What should I tell my health care provider before I take this medicine? They need to know if you have any of these conditions: -an eating disorder, such as anorexia or bulimia -bipolar disorder or psychosis -diabetes or high blood sugar, treated with medication -glaucoma -head injury or brain tumor -heart disease, previous heart attack, or irregular heart beat -high blood pressure -kidney or liver disease -seizures (convulsions) -suicidal thoughts or a previous suicide attempt -Tourette's syndrome -weight loss -an unusual or allergic reaction to bupropion, other medicines, foods, dyes, or preservatives -breast-feeding -pregnant or trying to become pregnant How should I use this medicine? Take this medicine by mouth with a glass of water. Follow the directions on the prescription label. You can take it with or without food. If it upsets your stomach, take it with food. Do not crush, chew, or cut these tablets. This medicine is taken once daily at the same time each day. Do not take your medicine more often than directed. Do not stop taking this medicine suddenly except upon the advice of your doctor. Stopping this medicine too quickly may cause serious side effects or your condition may worsen. A special MedGuide will be given to you by the pharmacist with each prescription and refill. Be sure to read this information carefully each time. Talk to your pediatrician regarding the use of this medicine in children. Special care may be  needed. Overdosage: If you think you have taken too much of this medicine contact a poison control center or emergency room at once. NOTE: This medicine is only for you. Do not share this medicine with others. What if I miss a dose? If you miss a dose, skip the missed dose and take your next tablet at the regular time. Do not take double or extra doses. What may interact with this medicine? Do not take this medicine with any of the following medications: -linezolid -MAOIs like Azilect, Carbex, Eldepryl, Marplan, Nardil, and Parnate -methylene blue (injected into a vein) -other medicines that contain bupropion like Zyban This medicine may also interact with the following medications: -alcohol -certain medicines for anxiety or sleep -certain medicines for blood pressure like metoprolol, propranolol -certain medicines for depression or psychotic disturbances -certain medicines for HIV or AIDS like efavirenz, lopinavir, nelfinavir, ritonavir -certain medicines for irregular heart beat like propafenone, flecainide -certain medicines for Parkinson's disease like amantadine, levodopa -certain medicines for seizures like carbamazepine, phenytoin, phenobarbital -cimetidine -clopidogrel -cyclophosphamide -digoxin -furazolidone -isoniazid -nicotine -orphenadrine -procarbazine -steroid medicines like prednisone or cortisone -stimulant medicines for attention disorders, weight loss, or to stay awake -tamoxifen -theophylline -thiotepa -ticlopidine -tramadol -warfarin This list may not describe all possible interactions. Give your health care provider a list of all the medicines, herbs, non-prescription drugs, or dietary supplements you use. Also tell them if you smoke, drink alcohol, or use illegal drugs. Some items may interact with your medicine. What should I watch for while using this medicine? Tell your doctor if your symptoms do not get better or if they get worse. Visit your doctor or  health care professional for regular checks on your progress.  Because it may take several weeks to see the full effects of this medicine, it is important to continue your treatment as prescribed by your doctor. Patients and their families should watch out for new or worsening thoughts of suicide or depression. Also watch out for sudden changes in feelings such as feeling anxious, agitated, panicky, irritable, hostile, aggressive, impulsive, severely restless, overly excited and hyperactive, or not being able to sleep. If this happens, especially at the beginning of treatment or after a change in dose, call your health care professional. Avoid alcoholic drinks while taking this medicine. Drinking large amounts of alcoholic beverages, using sleeping or anxiety medicines, or quickly stopping the use of these agents while taking this medicine may increase your risk for a seizure. Do not drive or use heavy machinery until you know how this medicine affects you. This medicine can impair your ability to perform these tasks. Do not take this medicine close to bedtime. It may prevent you from sleeping. Your mouth may get dry. Chewing sugarless gum or sucking hard candy, and drinking plenty of water may help. Contact your doctor if the problem does not go away or is severe. The tablet shell for some brands of this medicine does not dissolve. This is normal. The tablet shell may appear whole in the stool. This is not a cause for concern. What side effects may I notice from receiving this medicine? Side effects that you should report to your doctor or health care professional as soon as possible: -allergic reactions like skin rash, itching or hives, swelling of the face, lips, or tongue -breathing problems -changes in vision -confusion -elevated mood, decreased need for sleep, racing thoughts, impulsive behavior -fast or irregular heartbeat -hallucinations, loss of contact with reality -increased blood  pressure -redness, blistering, peeling or loosening of the skin, including inside the mouth -seizures -suicidal thoughts or other mood changes -unusually weak or tired -vomiting Side effects that usually do not require medical attention (report to your doctor or health care professional if they continue or are bothersome): -constipation -headache -loss of appetite -nausea -tremors -weight loss This list may not describe all possible side effects. Call your doctor for medical advice about side effects. You may report side effects to FDA at 1-800-FDA-1088. Where should I keep my medicine? Keep out of the reach of children. Store at room temperature between 15 and 30 degrees C (59 and 86 degrees F). Throw away any unused medicine after the expiration date. NOTE: This sheet is a summary. It may not cover all possible information. If you have questions about this medicine, talk to your doctor, pharmacist, or health care provider.  2018 Elsevier/Gold Standard (2016-04-17 13:55:13)

## 2018-06-13 NOTE — Telephone Encounter (Signed)
Dr. Jaynee Eagles came to me regarding this patient, she is in office today. Dr. Jaynee Eagles would like to get her started on Botox injections as soon as possible. I called her insurance company and spoke with Estill Batten, she stated that it does require authorization. We initiated this authorization today over the phone we used G43.711 per Dr. Cathren Laine request. We started authorization for [id number] and [phone]. They have requested we fax over clinicals to [phone]. Patient's name and DOB on the cover letter. Turn around time will be between 7-10 business days, [id number]. Call [phone]. Clinicals have been faxed.

## 2018-06-14 ENCOUNTER — Telehealth: Payer: Self-pay | Admitting: Neurology

## 2018-06-14 ENCOUNTER — Other Ambulatory Visit: Payer: Self-pay | Admitting: Neurology

## 2018-06-14 LAB — COMPREHENSIVE METABOLIC PANEL
ALK PHOS: 78 IU/L (ref 39–117)
ALT: 17 IU/L (ref 0–32)
AST: 24 IU/L (ref 0–40)
Albumin/Globulin Ratio: 1.6 (ref 1.2–2.2)
Albumin: 4 g/dL (ref 3.5–5.5)
BUN/Creatinine Ratio: 24 — ABNORMAL HIGH (ref 9–23)
BUN: 18 mg/dL (ref 6–20)
Bilirubin Total: 0.2 mg/dL (ref 0.0–1.2)
CO2: 19 mmol/L — AB (ref 20–29)
CREATININE: 0.74 mg/dL (ref 0.57–1.00)
Calcium: 9.1 mg/dL (ref 8.7–10.2)
Chloride: 107 mmol/L — ABNORMAL HIGH (ref 96–106)
GFR calc Af Amer: 121 mL/min/{1.73_m2} (ref >59)
GFR calc non Af Amer: 105 mL/min/{1.73_m2} (ref >59)
GLOBULIN, TOTAL: 2.5 g/dL (ref 1.5–4.5)
GLUCOSE: 81 mg/dL (ref 65–99)
Potassium: 4.5 mmol/L (ref 3.5–5.2)
SODIUM: 141 mmol/L (ref 134–144)
Total Protein: 6.5 g/dL (ref 6.0–8.5)

## 2018-06-14 LAB — CBC
Hematocrit: 38.3 % (ref 34.0–46.6)
Hemoglobin: 11.9 g/dL (ref 11.1–15.9)
MCH: 25.4 pg — ABNORMAL LOW (ref 26.6–33.0)
MCHC: 31.1 g/dL — AB (ref 31.5–35.7)
MCV: 82 fL (ref 79–97)
PLATELETS: 287 10*3/uL (ref 150–450)
RBC: 4.69 x10E6/uL (ref 3.77–5.28)
RDW: 16.1 % — ABNORMAL HIGH (ref 12.3–15.4)
WBC: 7.8 10*3/uL (ref 3.4–10.8)

## 2018-06-14 LAB — TSH: TSH: 1.73 u[IU]/mL (ref 0.450–4.500)

## 2018-06-14 MED ORDER — ALPRAZOLAM 0.25 MG PO TABS: ORAL_TABLET | ORAL | 0 refills | Status: DC

## 2018-06-14 MED FILL — ALPRAZolam 0.25 MG TABS: 0.25 | 1 days supply | Qty: 4 | Fill #0

## 2018-06-14 NOTE — Telephone Encounter (Signed)
[facility] auth: [id number] (exp. 06/20/18)  [facility] is scheduled [facility] for [facility]/18 and informed me she would like something to help with her nerves.[name]

## 2018-06-14 NOTE — Telephone Encounter (Signed)
Please let patient know was sent to [facility]

## 2018-06-15 NOTE — Telephone Encounter (Signed)
LVM for pt to be aware it has been sent to her pharmacy.

## 2018-06-16 MED FILL — RIZATRIPTAN BENZOATE 10 MG: 10 | 30 days supply | Qty: 12 | Fill #1

## 2018-06-20 ENCOUNTER — Ambulatory Visit (HOSPITAL_COMMUNITY)
Admission: RE | Admit: 2018-06-20 | Discharge: 2018-06-20 | Disposition: A | Discharge status: Home / Self Care | Payer: No Typology Code available for payment source | Source: Ambulatory Visit | Attending: Neurology | Admitting: Neurology

## 2018-06-20 DIAGNOSIS — R51 Headache with orthostatic component, not elsewhere classified: Secondary | ICD-10-CM

## 2018-06-20 DIAGNOSIS — Q2730 Arteriovenous malformation, site unspecified: Secondary | ICD-10-CM | POA: Diagnosis present

## 2018-06-20 DIAGNOSIS — Q282 Arteriovenous malformation of cerebral vessels: Secondary | ICD-10-CM | POA: Insufficient documentation

## 2018-06-20 DIAGNOSIS — R519 Headache, unspecified: Secondary | ICD-10-CM

## 2018-06-20 MED ORDER — GADOBENATE DIMEGLUMINE 529 MG/ML IV SOLN
18.0000 mL | Freq: Once | INTRAVENOUS | Status: AC | PRN
  Administered 2018-06-20: 18 mL via INTRAVENOUS

## 2018-06-20 NOTE — Telephone Encounter (Signed)
Botox authorization approved, [id number] (06/09/19).

## 2018-06-22 ENCOUNTER — Ambulatory Visit (INDEPENDENT_AMBULATORY_CARE_PROVIDER_SITE_OTHER): Payer: No Typology Code available for payment source | Admitting: Neurology

## 2018-06-22 DIAGNOSIS — G43711 Chronic migraine without aura, intractable, with status migrainosus: Secondary | ICD-10-CM | POA: Diagnosis not present

## 2018-06-22 NOTE — Progress Notes (Signed)
Botox- 100 units x 2 vials Lot: [id number] Expiration: 03/22 NDC: 1388-7195-97  Bacteriostatic 0.9% Sodium Chloride- 90mL total Lot: [id number] Expiration: 08/10/2019 NDC: 0158-6825-74  Dx: V35.521 B/B //BCrn

## 2018-06-22 NOTE — Progress Notes (Signed)

## 2018-06-24 ENCOUNTER — Ambulatory Visit (INDEPENDENT_AMBULATORY_CARE_PROVIDER_SITE_OTHER): Payer: No Typology Code available for payment source | Admitting: Family

## 2018-06-24 ENCOUNTER — Encounter: Payer: Self-pay | Admitting: Family

## 2018-06-24 ENCOUNTER — Ambulatory Visit: Payer: No Typology Code available for payment source | Admitting: Neurology

## 2018-06-24 VITALS — BP 128/94 | HR 88 | Temp 98.3°F | Resp 16 | Ht 64.0 in | Wt 177.8 lb

## 2018-06-24 DIAGNOSIS — G43909 Migraine, unspecified, not intractable, without status migrainosus: Secondary | ICD-10-CM | POA: Diagnosis not present

## 2018-06-24 DIAGNOSIS — I10 Essential (primary) hypertension: Secondary | ICD-10-CM | POA: Diagnosis not present

## 2018-06-24 MED ORDER — AMLODIPINE BESYLATE 5 MG PO TABS: 5.0000 mg | ORAL_TABLET | Freq: Every day | ORAL | 3 refills | Status: DC

## 2018-06-24 MED FILL — AMLODIPINE BESYLATE 5 MG TA: 5 | 30 days supply | Qty: 30 | Fill #0

## 2018-06-24 NOTE — Progress Notes (Signed)
Subjective:    Patient ID: Kaitlin Foster, female    DOB: 26-May-1981, 37 y.o.   MRN: [id number]  HPI  Patient is a 37 yr old female who presents today for follow up.  HTN- She is maintained on cartia xt 120mg  once daily. Notes a lot of stress recently. Mom was diagnosed with cancer in her sinus. Also, grandmother has colon cancer and dementia and was placed in hospice this week.  BP Readings from Last 3 Encounters:  06/24/18 (!) 128/94  06/13/18 (!) 140/91  05/24/18 129/74   Migraines- had botox injection last Wednesday. Following with neurology. Overall her symptoms have been well controlled.     Review of Systems See HPI  Past Medical History:  Diagnosis Date  . Anxiety   . AVM (arteriovenous malformation)    leaky  . Back pain   . Hypertension   . Joint pain   . Migraines      Social History   Socioeconomic History  . Marital status: Married    Spouse name: Thad  . Number of children: 1  . Years of education: Not on file  . Highest education level: Associate degree: academic program  Occupational History  . Occupation: Therapist, sports  Social Needs  . Financial resource strain: Not on file  . Food insecurity:    Worry: Not on file    Inability: Not on file  . Transportation needs:    Medical: Not on file    Non-medical: Not on file  Tobacco Use  . Smoking status: Never Smoker  . Smokeless tobacco: Never Used  Substance and Sexual Activity  . Alcohol use: No    Alcohol/week: 0.0 standard drinks  . Drug use: No  . Sexual activity: Yes    Birth control/protection: Condom  Lifestyle  . Physical activity:    Days per week: Not on file    Minutes per session: Not on file  . Stress: Not on file  Relationships  . Social connections:    Talks on phone: Not on file    Gets together: Not on file    Attends religious service: Not on file    Active member of club or organization: Not on file    Attends meetings of clubs or organizations: Not on file    Relationship  status: Not on file  . Intimate partner violence:    Fear of current or ex partner: Not on file    Emotionally abused: Not on file    Physically abused: Not on file    Forced sexual activity: Not on file  Other Topics Concern  . 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 CESAREAN SECTION    . CRANIOTOMY  2008   left frontal AVM  . emobilization      Family History  Problem Relation Age of Onset  . Hypertension Father   . Alcohol abuse Father   . Arthritis Father   . Obesity Father   . Hypertension Mother   . Arthritis Mother   . Migraines Mother   . Obesity Mother   . Cancer Mother   . Migraines Sister   . Ulcerative colitis  Sister   . Migraines Maternal Aunt   . ADD / ADHD Son   . Cancer Maternal Grandmother   . Dementia Maternal Grandmother   . Heart disease Neg Hx     Allergies  Allergen Reactions  . Codeine     REACTION: upset stomach once, but has taken it other times and been ok  . Lisinopril     cough  . Sulfonamide Derivatives Hives    Current Outpatient Medications on File Prior to Visit  Medication Sig Dispense Refill  . Aspirin-Acetaminophen-Caffeine (EXCEDRIN MIGRAINE PO) Take 2 tablets by mouth 2 (two) times daily as needed.    [name] azelastine (ASTELIN) 0.1 % nasal spray Place 1 spray into both nostrils daily. Use in each nostril as directed 30 mL 12  . azelastine (OPTIVAR) 0.05 % ophthalmic solution 1 drop 2 (two) times daily as needed.  6 mL 12  . buPROPion (WELLBUTRIN SR) 150 MG 12 hr tablet Take 1 tablet (150 mg total) by mouth 2 (two) times daily. 60 tablet 11  . diltiazem (CARTIA XT) 120 MG 24 hr capsule Take 1 capsule (120 mg total) by mouth daily. 90 capsule 1  . fluticasone (FLONASE) 50 MCG/ACT  nasal spray 2 sprays into each nostril daily 16 g 2  . ibuprofen (ADVIL,MOTRIN) 600 MG tablet Take 600 mg by mouth every 6 (six) hours as needed.    [name] levocetirizine (XYZAL) 5 MG tablet TAKE 1 TABLET BY MOUTH EVERY EVENING. 90 tablet 1  . montelukast (SINGULAIR) 10 MG tablet TAKE 1 TABLET (10 MG TOTAL) BY MOUTH AT BEDTIME. 90 tablet 1  . rizatriptan (MAXALT) 10 MG tablet Take 1 tablet (10 mg total) once as needed by mouth for migraine. May repeat in 2 hours if needed 30 tablet 2  . topiramate (TOPAMAX) 25 MG tablet Take 1 tablet (25 mg total) 2 (two) times daily by mouth. 120 tablet 3   No current facility-administered medications on file prior to visit.     BP (!) 128/94 (BP Location: Right Arm, Cuff Size: Normal)   Pulse 88   Temp 98.3 F (36.8 C) (Oral)   Resp 16   Ht 5\' 4"  (1.626 m)   Wt 177 lb 12.8 oz (80.6 kg)   LMP 06/17/2018   SpO2 99%   BMI 30.52 kg/m       Objective:   Physical Exam  Constitutional: She is oriented to person, place, and time. She appears well-developed and well-nourished.  Cardiovascular: Normal rate, regular rhythm and normal heart sounds.  No murmur heard. Pulmonary/Chest: Effort normal and breath sounds normal. No respiratory distress. She has no wheezes.  Neurological: She is alert and oriented to person, place, and time.  Skin: Skin is warm and dry.  Psychiatric: She has a normal mood and affect. Her behavior is normal. Judgment and thought content normal.          Assessment & Plan:  HTN- uncontrolled. D/c cartia, start amlodipine.  Migraines- stable, management per neurology.

## 2018-06-24 NOTE — Patient Instructions (Signed)
Please stop cartia and start amlodipine.

## 2018-07-04 ENCOUNTER — Encounter: Payer: Self-pay | Admitting: Family

## 2018-07-20 ENCOUNTER — Telehealth: Payer: No Typology Code available for payment source | Admitting: Family

## 2018-07-20 DIAGNOSIS — H00011 Hordeolum externum right upper eyelid: Secondary | ICD-10-CM

## 2018-07-20 MED ORDER — NEOMYCIN-POLYMYXIN-DEXAMETH 3.5-10000-0.1 OP SUSP: 2.0000 [drp] | Freq: Four times a day (QID) | OPHTHALMIC | 0 refills | Status: AC

## 2018-07-20 MED FILL — NEO/POLYMYXIN/DEXAMETH DROP: 3.5-10000-0 | 13 days supply | Qty: 5 | Fill #0

## 2018-07-20 NOTE — Progress Notes (Signed)
We are sorry that you are not feeling well. Here is how we plan to help!  Based on what you have shared with me it looks like you have a stye.  A stye is an inflammation of the eyelid.  It is often a red, painful lump near the edge of the eyelid that may look like a boil or a pimple.  A stye develops when an infection occurs at the base of an eyelash.   We have made appropriate suggestions for you based upon your presentation: Your symptoms may indicate an infection of the sclera.  The use of anti-inflammatory and antibiotic eye drops for a week will help resolve this condition.  I have sent in neomycin-polymyxin HC opthalmic suspension, two to three drops in the affected eye every 4 hours.  If your symptoms do not improve over the next two to three days you should be seen in your doctor's office.  HOME CARE:   Wash your hands often!  Let the stye open on its own. Don't squeeze or open it.  Don't rub your eyes. This can irritate your eyes and let in bacteria.  If you need to touch your eyes, wash your hands first.  Don't wear eye makeup or contact lenses until the area has healed.  GET HELP RIGHT AWAY IF:   Your symptoms do not improve.  You develop blurred or loss of vision.  Your symptoms worsen (increased discharge, pain or redness).  Thank you for choosing an e-visit.  Your e-visit answers were reviewed by a board certified advanced clinical practitioner to complete your personal care plan.  Depending upon the condition, your plan could have included both over the counter or prescription medications.  Please review your pharmacy choice.  Make sure the pharmacy is open so you can pick up prescription now.  If there is a problem, you may contact your provider through MyChart messaging and have the prescription routed to another pharmacy.    Your safety is important to us.  If you have drug allergies check your prescription carefully.  For the next 24 hours you can use MyChart to  ask questions about today's visit, request a non-urgent call back, or ask for a work or school excuse.  You will get an email in the next two days asking about your experience.  I hope you that your e-visit has been valuable and will speed your recovery.      

## 2018-07-23 LAB — GLUCOSE, POCT (MANUAL RESULT ENTRY): POC GLUCOSE: 94 mg/dL (ref 70–99)

## 2018-07-26 MED FILL — AMLODIPINE BESYLATE 5 MG TA: 5 | 30 days supply | Qty: 30 | Fill #1

## 2018-07-26 MED FILL — TOPIRAMATE 25 MG TAB: 25 | 60 days supply | Qty: 120 | Fill #2

## 2018-07-26 MED FILL — MONTELUKAST SOD 10 MG TAB: 10 | 90 days supply | Qty: 90 | Fill #1

## 2018-08-03 ENCOUNTER — Encounter: Payer: Self-pay | Admitting: Family

## 2018-08-03 ENCOUNTER — Ambulatory Visit (INDEPENDENT_AMBULATORY_CARE_PROVIDER_SITE_OTHER): Payer: No Typology Code available for payment source | Admitting: Family

## 2018-08-03 VITALS — BP 135/90 | HR 79 | Temp 98.6°F | Resp 16 | Ht 64.0 in | Wt 183.0 lb

## 2018-08-03 DIAGNOSIS — I10 Essential (primary) hypertension: Secondary | ICD-10-CM | POA: Diagnosis not present

## 2018-08-03 DIAGNOSIS — Z0000 Encounter for general adult medical examination without abnormal findings: Secondary | ICD-10-CM

## 2018-08-03 DIAGNOSIS — F329 Major depressive disorder, single episode, unspecified: Secondary | ICD-10-CM | POA: Diagnosis not present

## 2018-08-03 DIAGNOSIS — F32A Depression, unspecified: Secondary | ICD-10-CM

## 2018-08-03 LAB — LIPID PANEL
CHOL/HDL RATIO: 4
Cholesterol: 156 mg/dL (ref 0–200)
HDL: 39.5 mg/dL (ref >39.00)
LDL CALC: 92 mg/dL (ref 0–99)
NonHDL: 116.89
TRIGLYCERIDES: 123 mg/dL (ref 0.0–149.0)
VLDL: 24.6 mg/dL (ref 0.0–40.0)

## 2018-08-03 LAB — URINALYSIS, ROUTINE W REFLEX MICROSCOPIC
Bilirubin Urine: NEGATIVE
Hgb urine dipstick: NEGATIVE
KETONES UR: NEGATIVE
Leukocytes, UA: NEGATIVE
Nitrite: NEGATIVE
SPECIFIC GRAVITY, URINE: 1.02 (ref 1.000–1.030)
Total Protein, Urine: NEGATIVE
Urine Glucose: NEGATIVE
Urobilinogen, UA: 0.2 (ref 0.0–1.0)
pH: 6 (ref 5.0–8.0)

## 2018-08-03 MED ORDER — AMLODIPINE BESYLATE 10 MG PO TABS: 10.0000 mg | ORAL_TABLET | Freq: Every day | ORAL | 3 refills | Status: DC

## 2018-08-03 NOTE — Progress Notes (Signed)
Subjective:    Patient ID: Kaitlin Foster, female    DOB: [birth year], 37 y.o.   MRN: [id number]  HPI  Patient presents today for complete physical.  Immunizations:  Had flu shot at work.  Diet: Needs improvement Exercise: not exercising Pap Smear: 02/08/15 Wt Readings from Last 3 Encounters:  08/03/18 183 lb (83 kg)  06/24/18 177 lb 12.8 oz (80.6 kg)  06/13/18 183 lb 3.2 oz (83.1 kg)  Dental: up to date Vision: due  Mom has been sick. Has had a lot of stress. This has affected her mood. Neuro started wellbutrin to help with stress and migraines. Reports that she definitely felt better on wellbutrin, stopped it because of hair loss. Not sure that this was the cause of hairloss, thinks it could have been stress related. Had CMET, cbc, tsh with neuro which was unremarkable.   HTN- maintained on amlodipine 5mg .    BP Readings from Last 3 Encounters:  08/03/18 135/90  06/24/18 (!) 128/94  06/13/18 (!) 140/91     Reports that she has had some hair loss.  Review of Systems  Constitutional: Negative for unexpected weight change.  HENT: Negative for hearing loss and rhinorrhea.   Eyes: Negative for visual disturbance.  Respiratory: Negative for cough and shortness of breath.   Cardiovascular: Negative for chest pain and leg swelling.  Gastrointestinal: Negative for blood in stool, constipation and diarrhea.  Genitourinary: Negative for dysuria, frequency, hematuria and menstrual problem.  Musculoskeletal: Negative for arthralgias and myalgias.  Skin: Negative for rash.  Neurological: Positive for headaches.  Hematological:       Reports enlarged lymph node behind right ear   Past Medical History:  Diagnosis Date  . Anxiety   . AVM (arteriovenous malformation)    leaky  . Back pain   . Hypertension   . Joint pain   . Migraines      Social History   Socioeconomic History  . Marital status: Married    Spouse name: Thad  . Number of children: 1  . Years of education:  Not on file  . Highest education level: Associate degree: academic program  Occupational History  . Occupation: Therapist, sports  Social Needs  . Financial resource strain: Not on file  . Food insecurity:    Worry: Not on file    Inability: Not on file  . Transportation needs:    Medical: Not on file    Non-medical: Not on file  Tobacco Use  . Smoking status: Never Smoker  . Smokeless tobacco: Never Used  Substance and Sexual Activity  . Alcohol use: No    Alcohol/week: 0.0 standard drinks  . Drug use: No  . Sexual activity: Yes    Birth control/protection: Condom  Lifestyle  . Physical activity:    Days per week: Not on file    Minutes per session: Not on file  . Stress: Not on file  Relationships  . Social connections:    Talks on phone: Not on file    Gets together: Not on file    Attends religious service: Not on file    Active member of club or organization: Not on file    Attends meetings of clubs or organizations: Not on file    Relationship status: Not on file  . Intimate partner violence:    Fear of current or ex partner: Not on file    Emotionally abused: Not on file    Physically abused: Not on file  Forced sexual activity: Not on file  Other Topics Concern  . 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 CESAREAN SECTION    . CRANIOTOMY  2008   left frontal AVM  . emobilization      Family History  Problem Relation Age of Onset  . Hypertension Father   . Alcohol abuse Father   . Arthritis Father   . Obesity Father   . Hypertension Mother   . Arthritis Mother   . Migraines Mother   . Obesity Mother   . Cancer Mother        sinus tumor  . Migraines Sister   . Ulcerative colitis Sister   . Migraines Maternal  Aunt   . ADD / ADHD Son   . Cancer Maternal Grandmother        colon diagnosed at age 18  . Dementia Maternal Grandmother   . Heart disease Neg Hx     Allergies  Allergen Reactions  . Codeine     REACTION: upset stomach once, but has taken it other times and been ok  . Lisinopril     cough  . Sulfonamide Derivatives Hives    Current Outpatient Medications on File Prior to Visit  Medication Sig Dispense Refill  . amLODipine (NORVASC) 5 MG tablet Take 1 tablet (5 mg total) by mouth daily. 30 tablet 3  . Aspirin-Acetaminophen-Caffeine (EXCEDRIN MIGRAINE PO) Take 2 tablets by mouth 2 (two) times daily as needed.    [name] azelastine (ASTELIN) 0.1 % nasal spray Place 1 spray into both nostrils daily. Use in each nostril as directed 30 mL 12  . azelastine (OPTIVAR) 0.05 % ophthalmic solution 1 drop 2 (two) times daily as needed.  6 mL 12  . ibuprofen (ADVIL,MOTRIN) 600 MG tablet Take 600 mg by mouth every 6 (six) hours as needed.    [name] levocetirizine (XYZAL) 5 MG tablet TAKE 1 TABLET BY MOUTH EVERY EVENING. 90 tablet 1  . montelukast (SINGULAIR) 10 MG tablet TAKE 1 TABLET (10 MG TOTAL) BY MOUTH AT BEDTIME. 90 tablet 1  . rizatriptan (MAXALT) 10 MG tablet Take 1 tablet (10 mg total) once as needed by mouth for migraine. May repeat in 2 hours if needed 30 tablet 2  . topiramate (TOPAMAX) 25 MG tablet Take 1 tablet (25 mg total) 2 (two) times daily by mouth. 120 tablet 3   No current facility-administered medications on file prior to visit.     BP 135/90 (BP Location: Right Arm, Patient Position: Sitting, Cuff Size: Small)   Pulse 79   Temp 98.6 F (37 C) (Oral)   Resp 16   Ht 5\' 4"  (1.626 m)   Wt 183 lb (83 kg)   LMP 07/17/2018 (Approximate)   SpO2 99%   BMI 31.41 kg/m       Objective:   Physical Exam Physical Exam  Constitutional: She is oriented to person, place, and time. She appears well-developed and well-nourished. No distress.  HENT:  Head: Normocephalic and atraumatic.    Right Ear: Tympanic membrane and ear canal normal.  Left Ear: Tympanic membrane and ear canal normal.  Mouth/Throat: Oropharynx is clear and moist.  Eyes: Pupils are equal, round, and reactive to light. No scleral icterus.  Neck: Normal range of motion. No thyromegaly present.  Cardiovascular: Normal rate and regular rhythm.   No murmur heard. Pulmonary/Chest: Effort normal and breath sounds normal. No respiratory distress. He has no wheezes. She has no rales. She exhibits no tenderness.  Abdominal: Soft. Bowel sounds are normal. She exhibits no distension and no mass. There is no tenderness. There is no rebound and no guarding.  Musculoskeletal: She exhibits no edema.  Lymphadenopathy:    She has no cervical adenopathy.  Neurological: She is alert and oriented to person, place, and time. She has normal patellar reflexes. She exhibits normal muscle tone. Coordination normal.  Skin: Skin is warm and dry.  Psychiatric: She has a normal mood and affect. Her behavior is normal. Judgment and thought content normal.  Breasts: Examined lying Right: Without masses, retractions, discharge or axillary adenopathy.  Left: Without masses, retractions, discharge or axillary adenopathy.            Assessment & Plan:   Preventative care- discussed healthy diet, exercise, weight loss.  She will schedule follow up with GYN for pap.  Obtain UA/lipid panel.  HTN- I would like to see her bp at 130/80 or less given her hx of aneurysm.  Will increase amlodipine from 5mg  to 10mg .  Depression- felt better on wellbutrin, we discussed having her restart.  I doubt this is the cause for her recent hair loss. Suspect stress was playing a role.        Assessment & Plan:

## 2018-08-03 NOTE — Patient Instructions (Addendum)
Please schedule a pap smear with your GYN and a routine eye exam.  Please increase amlodipine from 5mg  to 10mg .  Check BP daily for 7 days and send me your readings via mychart.  Please continue to work on healthy diet, exercise and weight loss.

## 2018-08-16 NOTE — Telephone Encounter (Signed)
Patient called and left a message with questions regarding the botox savings card. I called her back but she did not answer and her VM was full.

## 2018-08-17 ENCOUNTER — Encounter: Payer: Self-pay | Admitting: Family

## 2018-08-19 ENCOUNTER — Other Ambulatory Visit: Payer: Self-pay | Admitting: Family

## 2018-08-19 MED FILL — LEVOCETIRIZINE 5 MG TABLET: 5 | 90 days supply | Qty: 90 | Fill #1

## 2018-08-19 MED FILL — AMLODIPINE BESYLATE 10 MG T: 10 | 30 days supply | Qty: 30 | Fill #0

## 2018-09-07 NOTE — Telephone Encounter (Signed)
I called the patient to follow up on the savings card. She has a balance with our office and will need to set up a payment plan before her next apt. She did not answer and her mail box was full.

## 2018-09-12 NOTE — Telephone Encounter (Signed)
I called the patient to make her aware of balance. She stated that she does not know if she wants to continue injections because of the price. She said that she could not get the savings program to work and that the program is denying her. I asked her what the reason for the denial was and she said she did not know. I asked her if she called the savings program and she said no she had not done that. I asked her if she had improvement from the injections and she said she "didn't know" and it was "hard to tell". She stated that she felt "pushed to do the injections" I offered to cancel her apt for her and she requested to keep it and stated that she would pay the copay today. I transferred her to billing.

## 2018-09-13 ENCOUNTER — Encounter: Payer: Self-pay | Admitting: Family

## 2018-09-14 MED ORDER — AMLODIPINE BESYLATE 5 MG PO TABS: 5.0000 mg | ORAL_TABLET | Freq: Every day | ORAL | 3 refills | Status: DC

## 2018-09-14 MED ORDER — METOPROLOL SUCCINATE ER 25 MG PO TB24: 25.0000 mg | ORAL_TABLET | Freq: Every day | ORAL | 3 refills | Status: DC

## 2018-09-14 MED FILL — METOPROLOL SUCCINATE ER 25: 25 | 90 days supply | Qty: 90 | Fill #0

## 2018-09-14 MED FILL — AMLODIPINE BESYLATE 5 MG TA: 5 | 90 days supply | Qty: 90 | Fill #0

## 2018-09-14 NOTE — Addendum Note (Signed)
Addended by: [name] on: 09/14/2018 05:21 PM   Modules accepted: Orders

## 2018-09-15 ENCOUNTER — Ambulatory Visit: Payer: No Typology Code available for payment source | Admitting: Neurology

## 2018-09-15 ENCOUNTER — Ambulatory Visit (INDEPENDENT_AMBULATORY_CARE_PROVIDER_SITE_OTHER): Payer: No Typology Code available for payment source | Admitting: Neurology

## 2018-09-15 DIAGNOSIS — G43711 Chronic migraine without aura, intractable, with status migrainosus: Secondary | ICD-10-CM

## 2018-09-15 MED FILL — BUPROPION SR 150 MG TABLET: 150 | 30 days supply | Qty: 60 | Fill #1

## 2018-09-15 NOTE — Progress Notes (Signed)
Botox- 100 units x 2 vials Lot: [id number] Expiration: 02/2021 NDC: 0023-1145-01  Bacteriostatic 0.9% Sodium Chloride- 4mL total Lot: [id number] Expiration: 08/10/2019 NDC: 0409-1966-02  Dx: G43.711 B/B   

## 2018-09-15 NOTE — Progress Notes (Signed)
Consent Form Botulism Toxin Injection For Chronic Migraine  Interval history 09/15/2018: Baseline daily headaches and16 migraine days. Since last botox she has had 25 free headache days a month, exceptional improvement! +OO,notLS,+Masseters,+Temples  Reviewed orally with patient, additionally signature is on file:  Botulism toxin has been approved by the Federal drug administration for treatment of chronic migraine. Botulism toxin does not cure chronic migraine and it may not be effective in some patients.  The administration of botulism toxin is accomplished by injecting a small amount of toxin into the muscles of the neck and head. Dosage must be titrated for each individual. Any benefits resulting from botulism toxin tend to wear off after 3 months with a repeat injection required if benefit is to be maintained. Injections are usually done every 3-4 months with maximum effect peak achieved by about 2 or 3 weeks. Botulism toxin is expensive and you should be sure of what costs you will incur resulting from the injection.  The side effects of botulism toxin use for chronic migraine may include:   -Transient, and usually mild, facial weakness with facial injections  -Transient, and usually mild, head or neck weakness with head/neck injections  -Reduction or loss of forehead facial animation due to forehead muscle weakness  -Eyelid drooping  -Dry eye  -Pain at the site of injection or bruising at the site of injection  -Double vision  -Potential unknown long term risks  Contraindications: You should not have Botox if you are pregnant, nursing, allergic to albumin, have an infection, skin condition, or muscle weakness at the site of the injection, or have myasthenia gravis, Lambert-Eaton syndrome, or ALS.  It is also possible that as with any injection, there may be an allergic reaction or no effect from the medication. Reduced effectiveness after repeated injections is sometimes seen and  rarely infection at the injection site may occur. All care will be taken to prevent these side effects. If therapy is given over a long time, atrophy and wasting in the muscle injected may occur. Occasionally the patient's become refractory to treatment because they develop antibodies to the toxin. In this event, therapy needs to be modified.  I have read the above information and consent to the administration of botulism toxin.    BOTOX PROCEDURE NOTE FOR MIGRAINE HEADACHE    Contraindications and precautions discussed with patient(above). Aseptic procedure was observed and patient tolerated procedure. Procedure performed by Dr. Georgia Dom  The condition has existed for more than 6 months, and pt does not have a diagnosis of ALS, Myasthenia Gravis or Lambert-Eaton Syndrome.  Risks and benefits of injections discussed and pt agrees to proceed with the procedure.  Written consent obtained  These injections are medically necessary. Pt  receives good benefits from these injections. These injections do not cause sedations or hallucinations which the oral therapies may cause.  Description of procedure:  The patient was placed in a sitting position. The standard protocol was used for Botox as follows, with 5 units of Botox injected at each site:   -Procerus muscle, midline injection  -Corrugator muscle, bilateral injection  -Frontalis muscle, bilateral injection, with 2 sites each side, medial injection was performed in the upper one third of the frontalis muscle, in the region vertical from the medial inferior edge of the superior orbital rim. The lateral injection was again in the upper one third of the forehead vertically above the lateral limbus of the cornea, 1.5 cm lateral to the medial injection site.  -Temporalis muscle  injection, 5 sites, bilaterally. The first injection was 3 cm above the tragus of the ear, second injection site was 1.5 cm to 3 cm up from the first injection site in  line with the tragus of the ear. The third injection site was 1.5-3 cm forward between the first 2 injection sites. The fourth injection site was 1.5 cm posterior to the second injection site. 5th site laterally in the temporalis  muscleat the level of the outer canthus.  - Patient feels her clenching is a trigger for headaches. +5 units masseter bilaterally   - Patient feels the migraines are centered around the eyes +5 units bilaterally at the outer canthus in the orbicularis occuli  -Occipitalis muscle injection, 3 sites, bilaterally. The first injection was done one half way between the occipital protuberance and the tip of the mastoid process behind the ear. The second injection site was done lateral and superior to the first, 1 fingerbreadth from the first injection. The third injection site was 1 fingerbreadth superiorly and medially from the first injection site.  -Cervical paraspinal muscle injection, 2 sites, bilateral knee first injection site was 1 cm from the midline of the cervical spine, 3 cm inferior to the lower border of the occipital protuberance. The second injection site was 1.5 cm superiorly and laterally to the first injection site.  -Trapezius muscle injection was performed at 3 sites, bilaterally. The first injection site was in the upper trapezius muscle halfway between the inflection point of the neck, and the acromion. The second injection site was one half way between the acromion and the first injection site. The third injection was done between the first injection site and the inflection point of the neck.   Will return for repeat injection in 3 months.   A 200 unit sof Botox was used, any Botox not injected was wasted. The patient tolerated the procedure well, there were no complications of the above procedure.

## 2018-09-19 ENCOUNTER — Other Ambulatory Visit: Payer: Self-pay | Admitting: Neurology

## 2018-09-19 MED FILL — RIZATRIPTAN BENZOATE 10 MG: 10 | 30 days supply | Qty: 12 | Fill #0

## 2018-10-28 ENCOUNTER — Ambulatory Visit: Payer: No Typology Code available for payment source | Admitting: Family

## 2018-11-09 ENCOUNTER — Telehealth: Payer: No Typology Code available for payment source | Admitting: Family

## 2018-11-09 DIAGNOSIS — R05 Cough: Secondary | ICD-10-CM | POA: Diagnosis not present

## 2018-11-09 DIAGNOSIS — R059 Cough, unspecified: Secondary | ICD-10-CM

## 2018-11-09 MED ORDER — AZITHROMYCIN 250 MG PO TABS: ORAL_TABLET | ORAL | 0 refills | Status: DC

## 2018-11-09 MED ORDER — PREDNISONE 10 MG (21) PO TBPK: ORAL_TABLET | ORAL | 0 refills | Status: DC

## 2018-11-09 MED ORDER — BENZONATATE 100 MG PO CAPS: 100.0000 mg | ORAL_CAPSULE | Freq: Three times a day (TID) | ORAL | 0 refills | Status: DC | PRN

## 2018-11-09 NOTE — Progress Notes (Signed)
I will go ahead and treat antibiotics since cough has been going on for over a week and her she has had a change in her mucus and is having fatigue.   We are sorry that you are not feeling well.  Here is how we plan to help!  Based on your presentation I believe you most likely have A cough due to bacteria.  When patients have a fever and a productive cough with a change in color or increased sputum production, we are concerned about bacterial bronchitis.  If left untreated it can progress to pneumonia.  If your symptoms do not improve with your treatment plan it is important that you contact your provider.   I have prescribed Azithromyin 250 mg: two tablets now and then one tablet daily for 4 additonal days    In addition you may use A non-prescription cough medication called Robitussin DAC. Take 2 teaspoons every 8 hours or Delsym: take 2 teaspoons every 12 hours., A non-prescription cough medication called Mucinex DM: take 2 tablets every 12 hours. and A prescription cough medication called Tessalon Perles 100mg . You may take 1-2 capsules every 8 hours as needed for your cough. a Prednisone 10 mg daily for 6 days (see taper instructions below)  Directions for 6 day taper: Day 1: 2 tablets before breakfast, 1 after both lunch & dinner and 2 at bedtime Day 2: 1 tab before breakfast, 1 after both lunch & dinner and 2 at bedtime Day 3: 1 tab at each meal & 1 at bedtime Day 4: 1 tab at breakfast, 1 at lunch, 1 at bedtime Day 5: 1 tab at breakfast & 1 tab at bedtime Day 6: 1 tab at breakfast   From your responses in the eVisit questionnaire you describe inflammation in the upper respiratory tract which is causing a significant cough.  This is commonly called Bronchitis and has four common causes:    Allergies  Viral Infections  Acid Reflux  Bacterial Infection Allergies, viruses and acid reflux are treated by controlling symptoms or eliminating the cause. An example might be a cough caused  by taking certain blood pressure medications. You stop the cough by changing the medication. Another example might be a cough caused by acid reflux. Controlling the reflux helps control the cough.  USE OF BRONCHODILATOR ("RESCUE") INHALERS: There is a risk from using your bronchodilator too frequently.  The risk is that over-reliance on a medication which only relaxes the muscles surrounding the breathing tubes can reduce the effectiveness of medications prescribed to reduce swelling and congestion of the tubes themselves.  Although you feel brief relief from the bronchodilator inhaler, your asthma may actually be worsening with the tubes becoming more swollen and filled with mucus.  This can delay other crucial treatments, such as oral steroid medications. If you need to use a bronchodilator inhaler daily, several times per day, you should discuss this with your provider.  There are probably better treatments that could be used to keep your asthma under control.     HOME CARE . Only take medications as instructed by your medical team. . Complete the entire course of an antibiotic. . Drink plenty of fluids and get plenty of rest. . Avoid close contacts especially the very young and the elderly . Cover your mouth if you cough or cough into your sleeve. . Always remember to wash your hands . A steam or ultrasonic humidifier can help congestion.   GET HELP RIGHT AWAY IF: . You develop  worsening fever. . You become short of breath . You cough up blood. . Your symptoms persist after you have completed your treatment plan MAKE SURE YOU   Understand these instructions.  Will watch your condition.  Will get help right away if you are not doing well or get worse.  Your e-visit answers were reviewed by a board certified advanced clinical practitioner to complete your personal care plan.  Depending on the condition, your plan could have included both over the counter or prescription medications. If  there is a problem please reply  once you have received a response from your provider. Your safety is important to [place].  If you have drug allergies check your prescription carefully.    You can use MyChart to ask questions about today's visit, request a non-urgent call back, or ask for a work or school excuse for 24 hours related to this e-Visit. If it has been greater than 24 hours you will need to follow up with your provider, or enter a new e-Visit to address those concerns. You will get an e-mail in the next two days asking about your experience.  I hope that your e-visit has been valuable and will speed your recovery. Thank you for using e-visits.

## 2018-11-14 ENCOUNTER — Encounter: Payer: Self-pay | Admitting: Family

## 2018-11-14 ENCOUNTER — Ambulatory Visit (INDEPENDENT_AMBULATORY_CARE_PROVIDER_SITE_OTHER): Payer: No Typology Code available for payment source | Admitting: Family

## 2018-11-14 VITALS — BP 118/82 | HR 68 | Temp 98.6°F | Resp 16 | Ht 64.0 in | Wt 190.0 lb

## 2018-11-14 DIAGNOSIS — F329 Major depressive disorder, single episode, unspecified: Secondary | ICD-10-CM | POA: Diagnosis not present

## 2018-11-14 DIAGNOSIS — F32A Depression, unspecified: Secondary | ICD-10-CM

## 2018-11-14 DIAGNOSIS — I10 Essential (primary) hypertension: Secondary | ICD-10-CM | POA: Diagnosis not present

## 2018-11-14 DIAGNOSIS — J40 Bronchitis, not specified as acute or chronic: Secondary | ICD-10-CM

## 2018-11-14 MED ORDER — AMLODIPINE BESYLATE 5 MG PO TABS: 5.0000 mg | ORAL_TABLET | Freq: Every day | ORAL | 1 refills | Status: DC

## 2018-11-14 MED ORDER — BUPROPION HCL ER (SR) 150 MG PO TB12: 150.0000 mg | ORAL_TABLET | Freq: Two times a day (BID) | ORAL | Status: DC

## 2018-11-14 MED ORDER — METOPROLOL SUCCINATE ER 25 MG PO TB24: 25.0000 mg | ORAL_TABLET | Freq: Every day | ORAL | 1 refills | Status: DC

## 2018-11-14 NOTE — Progress Notes (Signed)
Subjective:    Patient ID: Kaitlin Foster, female    DOB: 1981-07-12, 38 y.o.   MRN: [id number]  HPI  Patient is a 38 yr old female who presents today for follow up:  HTN- BP meds include amlodipine 5mg , toprol xl 25mg ,    BP Readings from Last 3 Encounters:  11/14/18 118/82  08/03/18 135/90  06/24/18 (!) 128/94   Depression- She is currently on wellbutrin.  Reports that   Bronchitis-  was treated with pred taper 11/09/17/azithromycin on 1/1 via e-visit.    Review of Systems    see HPI  Past Medical History:  Diagnosis Date  . Anxiety   . AVM (arteriovenous malformation)    leaky  . Back pain   . Hypertension   . Joint pain   . Migraines      Social History   Socioeconomic History  . Marital status: Married    Spouse name: Thad  . Number of children: 1  . Years of education: Not on file  . Highest education level: Associate degree: academic program  Occupational History  . Occupation: Therapist, sports  Social Needs  . Financial resource strain: Not on file  . Food insecurity:    Worry: Not on file    Inability: Not on file  . Transportation needs:    Medical: Not on file    Non-medical: Not on file  Tobacco Use  . Smoking status: Never Smoker  . Smokeless tobacco: Never Used  Substance and Sexual Activity  . Alcohol use: No    Alcohol/week: 0.0 standard drinks  . Drug use: No  . Sexual activity: Yes    Birth control/protection: Condom  Lifestyle  . Physical activity:    Days per week: Not on file    Minutes per session: Not on file  . Stress: Not on file  Relationships  . Social connections:    Talks on phone: Not on file    Gets together: Not on file    Attends religious service: Not on file    Active member of club or organization: Not on file    Attends meetings of clubs or organizations: Not on file    Relationship status: Not on file  . Intimate partner violence:    Fear of current or ex partner: Not on file    Emotionally abused: Not on file   Physically abused: Not on file    Forced sexual activity: Not on file  Other Topics Concern  . 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 CESAREAN SECTION    . CRANIOTOMY  2008   left frontal AVM  . emobilization      Family History  Problem Relation Age of Onset  . Hypertension Father   . Alcohol abuse Father   . Arthritis Father   . Obesity Father   . Hypertension Mother   . Arthritis Mother   . Migraines Mother   . Obesity Mother   . Cancer Mother        sinus tumor  . Migraines Sister   . Ulcerative colitis Sister   . Migraines Maternal Aunt   . ADD / ADHD  Son   . Cancer Maternal Grandmother        colon diagnosed at age 22  . Dementia Maternal Grandmother   . Heart disease Neg Hx     Allergies  Allergen Reactions  . Codeine     REACTION: upset stomach once, but has taken it other times and been ok  . Lisinopril     cough  . Sulfonamide Derivatives Hives    Current Outpatient Medications on File Prior to Visit  Medication Sig Dispense Refill  . amLODipine (NORVASC) 5 MG tablet Take 1 tablet (5 mg total) by mouth daily. 30 tablet 3  . Aspirin-Acetaminophen-Caffeine (EXCEDRIN MIGRAINE PO) Take 2 tablets by mouth 2 (two) times daily as needed.    [name] azelastine (ASTELIN) 0.1 % nasal spray Place 1 spray into both nostrils daily. Use in each nostril as directed 30 mL 12  . azelastine (OPTIVAR) 0.05 % ophthalmic solution 1 drop 2 (two) times daily as needed.  6 mL 12  . azithromycin (ZITHROMAX) 250 MG tablet Take 500 mg once, then 250 mg for four days 6 tablet 0  . benzonatate (TESSALON PERLES) 100 MG capsule Take 1 capsule (100 mg total) by mouth 3 (three) times daily as needed. 20 capsule 0  . ibuprofen (ADVIL,MOTRIN)  600 MG tablet Take 600 mg by mouth every 6 (six) hours as needed.    [name] levocetirizine (XYZAL) 5 MG tablet TAKE 1 TABLET BY MOUTH EVERY EVENING. 90 tablet 1  . metoprolol succinate (TOPROL-XL) 25 MG 24 hr tablet Take 1 tablet (25 mg total) by mouth daily. 30 tablet 3  . montelukast (SINGULAIR) 10 MG tablet TAKE 1 TABLET (10 MG TOTAL) BY MOUTH AT BEDTIME. 90 tablet 1  . predniSONE (STERAPRED UNI-PAK 21 TAB) 10 MG (21) TBPK tablet Use as directed 21 tablet 0  . rizatriptan (MAXALT) 10 MG tablet TAKE 1 TABLET ONCE AS NEEDED BY MOUTH FOR MIGRAINE, MAY REPEAT IN 2 HOURS IF NEEDED 30 tablet 2  . topiramate (TOPAMAX) 25 MG tablet Take 1 tablet (25 mg total) 2 (two) times daily by mouth. 120 tablet 3   No current facility-administered medications on file prior to visit.     BP 118/82 (BP Location: Right Arm, Patient Position: Sitting, Cuff Size: Small)   Pulse 68   Temp 98.6 F (37 C) (Oral)   Resp 16   Ht 5\' 4"  (1.626 m)   Wt 190 lb (86.2 kg)   SpO2 99%   BMI 32.61 kg/m    Objective:   Physical Exam Constitutional:      Appearance: She is well-developed.  HENT:     Right Ear: Tympanic membrane and ear canal normal.     Left Ear: Tympanic membrane and ear canal normal.  Cardiovascular:     Rate and Rhythm: Normal rate and regular rhythm.     Heart sounds: Normal heart sounds. No murmur.  Pulmonary:     Effort: Pulmonary effort is normal. No respiratory distress.     Breath sounds: Normal breath sounds. No wheezing.  Psychiatric:        Behavior: Behavior normal.        Thought Content: Thought content normal.        Judgment: Judgment normal.           Assessment & Plan:  Bronchitis- improving. Advised pt to continue current meds and call if symptoms worsen or if they do not continue to improve.  HTN- bp stable on current meds. Continue same.  Depression- notes improved mood on wellbutrin which is being prescribed by neurology. Monitor.

## 2018-11-14 NOTE — Patient Instructions (Signed)
Please continue current meds.  

## 2018-12-13 ENCOUNTER — Other Ambulatory Visit: Payer: Self-pay | Admitting: Neurology

## 2018-12-13 ENCOUNTER — Other Ambulatory Visit: Payer: Self-pay | Admitting: Family

## 2018-12-13 MED FILL — METOPROLOL SUCCINATE ER 25: 25 | 30 days supply | Qty: 30 | Fill #1

## 2018-12-13 MED FILL — BUPROPION SR 150 MG TABLET: 150 | 30 days supply | Qty: 60 | Fill #2

## 2018-12-13 MED FILL — AMLODIPINE BESYLATE 5 MG TA: 5 | 30 days supply | Qty: 30 | Fill #1

## 2018-12-14 MED FILL — TOPIRAMATE 25 MG TAB: 25 | 60 days supply | Qty: 120 | Fill #0

## 2018-12-14 MED FILL — LEVOCETIRIZINE 5 MG TABLET: 5 | 90 days supply | Qty: 90 | Fill #0

## 2018-12-20 ENCOUNTER — Ambulatory Visit (INDEPENDENT_AMBULATORY_CARE_PROVIDER_SITE_OTHER): Payer: No Typology Code available for payment source | Admitting: Neurology

## 2018-12-20 DIAGNOSIS — G43711 Chronic migraine without aura, intractable, with status migrainosus: Secondary | ICD-10-CM | POA: Diagnosis not present

## 2018-12-20 NOTE — Progress Notes (Signed)
Botox- 100 units x 2 vials Lot: [id number] Expiration: 04/2021 NDC: 4799-8721-58  Bacteriostatic 0.9% Sodium Chloride- 67mL total Lot: [id number] Expiration: 08/10/2019 NDC: 4859-2763-94  Dx: V20.037 B/B

## 2018-12-20 NOTE — Progress Notes (Signed)
Consent Form Botulism Toxin Injection For Chronic Migraine  Interval history 09/15/2018: Baseline daily headaches and16 migraine days. Since last botox she has had 25 free headache days a month, exceptional improvement! +OO  Reviewed orally with patient, additionally signature is on file:  Botulism toxin has been approved by the Federal drug administration for treatment of chronic migraine. Botulism toxin does not cure chronic migraine and it may not be effective in some patients.  The administration of botulism toxin is accomplished by injecting a small amount of toxin into the muscles of the neck and head. Dosage must be titrated for each individual. Any benefits resulting from botulism toxin tend to wear off after 3 months with a repeat injection required if benefit is to be maintained. Injections are usually done every 3-4 months with maximum effect peak achieved by about 2 or 3 weeks. Botulism toxin is expensive and you should be sure of what costs you will incur resulting from the injection.  The side effects of botulism toxin use for chronic migraine may include:   -Transient, and usually mild, facial weakness with facial injections  -Transient, and usually mild, head or neck weakness with head/neck injections  -Reduction or loss of forehead facial animation due to forehead muscle weakness  -Eyelid drooping  -Dry eye  -Pain at the site of injection or bruising at the site of injection  -Double vision  -Potential unknown long term risks  Contraindications: You should not have Botox if you are pregnant, nursing, allergic to albumin, have an infection, skin condition, or muscle weakness at the site of the injection, or have myasthenia gravis, Lambert-Eaton syndrome, or ALS.  It is also possible that as with any injection, there may be an allergic reaction or no effect from the medication. Reduced effectiveness after repeated injections is sometimes seen and rarely infection at the  injection site may occur. All care will be taken to prevent these side effects. If therapy is given over a long time, atrophy and wasting in the muscle injected may occur. Occasionally the patient's become refractory to treatment because they develop antibodies to the toxin. In this event, therapy needs to be modified.  I have read the above information and consent to the administration of botulism toxin.    BOTOX PROCEDURE NOTE FOR MIGRAINE HEADACHE    Contraindications and precautions discussed with patient(above). Aseptic procedure was observed and patient tolerated procedure. Procedure performed by Dr. Georgia Dom  The condition has existed for more than 6 months, and pt does not have a diagnosis of ALS, Myasthenia Gravis or Lambert-Eaton Syndrome.  Risks and benefits of injections discussed and pt agrees to proceed with the procedure.  Written consent obtained  These injections are medically necessary. Pt  receives good benefits from these injections. These injections do not cause sedations or hallucinations which the oral therapies may cause.  Description of procedure:  The patient was placed in a sitting position. The standard protocol was used for Botox as follows, with 5 units of Botox injected at each site:   -Procerus muscle, midline injection  -Corrugator muscle, bilateral injection  -Frontalis muscle, bilateral injection, with 2 sites each side, medial injection was performed in the upper one third of the frontalis muscle, in the region vertical from the medial inferior edge of the superior orbital rim. The lateral injection was again in the upper one third of the forehead vertically above the lateral limbus of the cornea, 1.5 cm lateral to the medial injection site.  -Temporalis muscle  injection, 5 sites, bilaterally. The first injection was 3 cm above the tragus of the ear, second injection site was 1.5 cm to 3 cm up from the first injection site in line with the tragus of  the ear. The third injection site was 1.5-3 cm forward between the first 2 injection sites. The fourth injection site was 1.5 cm posterior to the second injection site. 5th site laterally in the temporalis  muscleat the level of the outer canthus.  - Patient feels her clenching is a trigger for headaches. +5 units masseter bilaterally   - Patient feels the migraines are centered around the eyes +5 units bilaterally at the outer canthus in the orbicularis occuli  -Occipitalis muscle injection, 3 sites, bilaterally. The first injection was done one half way between the occipital protuberance and the tip of the mastoid process behind the ear. The second injection site was done lateral and superior to the first, 1 fingerbreadth from the first injection. The third injection site was 1 fingerbreadth superiorly and medially from the first injection site.  -Cervical paraspinal muscle injection, 2 sites, bilateral knee first injection site was 1 cm from the midline of the cervical spine, 3 cm inferior to the lower border of the occipital protuberance. The second injection site was 1.5 cm superiorly and laterally to the first injection site.  -Trapezius muscle injection was performed at 3 sites, bilaterally. The first injection site was in the upper trapezius muscle halfway between the inflection point of the neck, and the acromion. The second injection site was one half way between the acromion and the first injection site. The third injection was done between the first injection site and the inflection point of the neck.   Will return for repeat injection in 3 months.   A 200 unit sof Botox was used, any Botox not injected was wasted. The patient tolerated the procedure well, there were no complications of the above procedure.

## 2019-01-20 MED FILL — BUPROPION HCL SR 150 MG TAB: 150 | 30 days supply | Qty: 60 | Fill #3

## 2019-01-20 MED FILL — METOPROLOL SUCCINATE ER 25: 25 | 90 days supply | Qty: 90 | Fill #0

## 2019-01-20 MED FILL — AMLODIPINE BESYLATE 5 MG TA: 5 | 90 days supply | Qty: 90 | Fill #0

## 2019-04-25 ENCOUNTER — Ambulatory Visit: Payer: No Typology Code available for payment source | Admitting: Family Medicine

## 2019-05-02 ENCOUNTER — Other Ambulatory Visit: Payer: Self-pay

## 2019-05-02 ENCOUNTER — Ambulatory Visit (INDEPENDENT_AMBULATORY_CARE_PROVIDER_SITE_OTHER): Payer: No Typology Code available for payment source | Admitting: Neurology

## 2019-05-02 DIAGNOSIS — G43711 Chronic migraine without aura, intractable, with status migrainosus: Secondary | ICD-10-CM | POA: Diagnosis not present

## 2019-05-02 MED ORDER — RIZATRIPTAN BENZOATE 10 MG PO TBDP: 10.0000 mg | ORAL_TABLET | ORAL | 11 refills | Status: AC | PRN

## 2019-05-02 MED ORDER — ONDANSETRON 4 MG PO TBDP: 4.0000 mg | ORAL_TABLET | Freq: Three times a day (TID) | ORAL | 3 refills | Status: DC | PRN

## 2019-05-02 MED FILL — ONDANSETRON ODT 4 MG TABLET: 4 | 10 days supply | Qty: 30 | Fill #0

## 2019-05-02 MED FILL — RIZATRIPTAN 10 MG ODT: 10 | 30 days supply | Qty: 12 | Fill #0

## 2019-05-02 NOTE — Progress Notes (Signed)
Botox- 200 units x 1 vial Lot: [id number] Expiration: 12/2021 NDC: 3382-5053-97  Bacteriostatic 0.9% Sodium Chloride- 12mL total Lot: [id number] Expiration: 08/10/2019 NDC: 3790-2409-73  Dx: Z32.992 B/B

## 2019-05-02 NOTE — Progress Notes (Signed)
Consent Form Botulism Toxin Injection For Chronic Migraine  Interval history 05/02/2019: Baseline daily headaches and16 migraine days. Since last botox she has had more than 25 free headache days a month, exceptional improvement! Possibly 1 migraine a month only. +all  Reviewed orally with patient, additionally signature is on file:  Botulism toxin has been approved by the Federal drug administration for treatment of chronic migraine. Botulism toxin does not cure chronic migraine and it may not be effective in some patients.  The administration of botulism toxin is accomplished by injecting a small amount of toxin into the muscles of the neck and head. Dosage must be titrated for each individual. Any benefits resulting from botulism toxin tend to wear off after 3 months with a repeat injection required if benefit is to be maintained. Injections are usually done every 3-4 months with maximum effect peak achieved by about 2 or 3 weeks. Botulism toxin is expensive and you should be sure of what costs you will incur resulting from the injection.  The side effects of botulism toxin use for chronic migraine may include:   -Transient, and usually mild, facial weakness with facial injections  -Transient, and usually mild, head or neck weakness with head/neck injections  -Reduction or loss of forehead facial animation due to forehead muscle weakness  -Eyelid drooping  -Dry eye  -Pain at the site of injection or bruising at the site of injection  -Double vision  -Potential unknown long term risks  Contraindications: You should not have Botox if you are pregnant, nursing, allergic to albumin, have an infection, skin condition, or muscle weakness at the site of the injection, or have myasthenia gravis, Lambert-Eaton syndrome, or ALS.  It is also possible that as with any injection, there may be an allergic reaction or no effect from the medication. Reduced effectiveness after repeated injections is  sometimes seen and rarely infection at the injection site may occur. All care will be taken to prevent these side effects. If therapy is given over a long time, atrophy and wasting in the muscle injected may occur. Occasionally the patient's become refractory to treatment because they develop antibodies to the toxin. In this event, therapy needs to be modified.  I have read the above information and consent to the administration of botulism toxin.    BOTOX PROCEDURE NOTE FOR MIGRAINE HEADACHE    Contraindications and precautions discussed with patient(above). Aseptic procedure was observed and patient tolerated procedure. Procedure performed by Dr. Georgia Dom  The condition has existed for more than 6 months, and pt does not have a diagnosis of ALS, Myasthenia Gravis or Lambert-Eaton Syndrome.  Risks and benefits of injections discussed and pt agrees to proceed with the procedure.  Written consent obtained  These injections are medically necessary. Pt  receives good benefits from these injections. These injections do not cause sedations or hallucinations which the oral therapies may cause.  Description of procedure:  The patient was placed in a sitting position. The standard protocol was used for Botox as follows, with 5 units of Botox injected at each site:   -Procerus muscle, midline injection  -Corrugator muscle, bilateral injection  -Frontalis muscle, bilateral injection, with 2 sites each side, medial injection was performed in the upper one third of the frontalis muscle, in the region vertical from the medial inferior edge of the superior orbital rim. The lateral injection was again in the upper one third of the forehead vertically above the lateral limbus of the cornea, 1.5 cm lateral  to the medial injection site.  -Temporalis muscle injection, 5 sites, bilaterally. The first injection was 3 cm above the tragus of the ear, second injection site was 1.5 cm to 3 cm up from the first  injection site in line with the tragus of the ear. The third injection site was 1.5-3 cm forward between the first 2 injection sites. The fourth injection site was 1.5 cm posterior to the second injection site. 5th site laterally in the temporalis  muscleat the level of the outer canthus.  - Patient feels her clenching is a trigger for headaches. +5 units masseter bilaterally   - Patient feels the migraines are centered around the eyes +5 units bilaterally at the outer canthus in the orbicularis occuli  -Occipitalis muscle injection, 3 sites, bilaterally. The first injection was done one half way between the occipital protuberance and the tip of the mastoid process behind the ear. The second injection site was done lateral and superior to the first, 1 fingerbreadth from the first injection. The third injection site was 1 fingerbreadth superiorly and medially from the first injection site.  -Cervical paraspinal muscle injection, 2 sites, bilateral knee first injection site was 1 cm from the midline of the cervical spine, 3 cm inferior to the lower border of the occipital protuberance. The second injection site was 1.5 cm superiorly and laterally to the first injection site.  -Trapezius muscle injection was performed at 3 sites, bilaterally. The first injection site was in the upper trapezius muscle halfway between the inflection point of the neck, and the acromion. The second injection site was one half way between the acromion and the first injection site. The third injection was done between the first injection site and the inflection point of the neck.   Will return for repeat injection in 3 months.   A 200 unit sof Botox was used, any Botox not injected was wasted. The patient tolerated the procedure well, there were no complications of the above procedure.

## 2019-05-17 ENCOUNTER — Other Ambulatory Visit: Payer: Self-pay

## 2019-05-17 ENCOUNTER — Encounter: Payer: Self-pay | Admitting: Family

## 2019-05-17 ENCOUNTER — Ambulatory Visit: Payer: No Typology Code available for payment source | Admitting: Family

## 2019-05-17 ENCOUNTER — Ambulatory Visit (INDEPENDENT_AMBULATORY_CARE_PROVIDER_SITE_OTHER): Payer: No Typology Code available for payment source | Admitting: Family

## 2019-05-17 VITALS — BP 129/83 | HR 70 | Wt 205.0 lb

## 2019-05-17 DIAGNOSIS — E669 Obesity, unspecified: Secondary | ICD-10-CM | POA: Diagnosis not present

## 2019-05-17 DIAGNOSIS — I10 Essential (primary) hypertension: Secondary | ICD-10-CM | POA: Diagnosis not present

## 2019-05-17 DIAGNOSIS — G43711 Chronic migraine without aura, intractable, with status migrainosus: Secondary | ICD-10-CM

## 2019-05-17 DIAGNOSIS — Z6831 Body mass index (BMI) 31.0-31.9, adult: Secondary | ICD-10-CM

## 2019-05-17 DIAGNOSIS — J309 Allergic rhinitis, unspecified: Secondary | ICD-10-CM

## 2019-05-17 NOTE — Progress Notes (Signed)
Virtual Visit via Video Note  I connected with Kaitlin Foster on 05/17/19 at 11:00 AM EDT by a video enabled telemedicine application and verified that I am speaking with the correct person using two identifiers.  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 The patient expressed understanding and agreed to proceed.  History of Present Illness:   Patient is a 38 yr old female who presents today for follow up.  HTN- current bp meds include amlodipine, metoprolol. She reports very mild swelling in her legs.   BP Readings from Last 3 Encounters:  05/17/19 129/83  11/14/18 118/82  08/03/18 135/90   Migraines- reports that she did not   Seasonal allergies- continues daily zyzal.  Uses nasocort prn if severe symptoms.   Obesity-she reports that her neurologist put her on Wellbutrin to help with her food cravings.  She reports that she takes this intermittently.  Not sure that helps much with her cravings but she does feel like it helps some with her mood.  She is frustrated that she has had recent weight gain in the setting of COVID and working from home.  She is motivated to try to become more active and lose some weight.  Wt Readings from Last 3 Encounters:  05/17/19 205 lb (93 kg)  11/14/18 190 lb (86.2 kg)  08/03/18 183 lb (83 kg)    Past Medical History:  Diagnosis Date  . Anxiety   . AVM (arteriovenous malformation)    leaky  . Back pain   . Hypertension   . Joint pain   . Migraines      Social History   Socioeconomic History  . Marital status: Married    Spouse name: Thad  . Number of children: 1  . Years of education: Not on file  . Highest education level: Associate degree: academic program  Occupational History  . Occupation: Therapist, sports  Social Needs  . Financial resource strain: Not on file  . Food insecurity    Worry: Not on file    Inability: Not  on file  . Transportation needs    Medical: Not on file    Non-medical: Not on file  Tobacco Use  . Smoking status: Never Smoker  . Smokeless tobacco: Never Used  Substance and Sexual Activity  . Alcohol use: No    Alcohol/week: 0.0 standard drinks  . Drug use: No  . Sexual activity: Yes    Birth control/protection: Condom  Lifestyle  . Physical activity    Days per week: Not on file    Minutes per session: Not on file  . Stress: Not on file  Relationships  . Social Herbalist on phone: Not on file    Gets together: Not on file    Attends religious service: Not on file    Active member of club or organization: Not on file    Attends meetings of clubs or organizations: Not on file    Relationship status: Not on file  . Intimate partner violence    Fear of current or ex partner: Not on file    Emotionally abused: Not on file    Physically abused: Not on file    Forced sexual activity: Not on file  Other Topics Concern  . 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 CESAREAN SECTION    . CRANIOTOMY  2008   left frontal AVM  . emobilization      Family History  Problem Relation Age of Onset  . Hypertension Father   . Alcohol abuse Father   . Arthritis Father   . Obesity Father   . Hypertension Mother   . Arthritis Mother   . Migraines Mother   . Obesity Mother   . Cancer Mother        sinus tumor  . Migraines Sister   . Ulcerative colitis Sister   . Migraines Maternal Aunt   . ADD / ADHD Son   . Cancer Maternal Grandmother        colon diagnosed at age 50  . Dementia Maternal Grandmother   . Heart disease Neg Hx     Allergies  Allergen Reactions  . Codeine     REACTION: upset stomach once, but has  taken it other times and been ok  . Lisinopril     cough  . Sulfonamide Derivatives Hives    Current Outpatient Medications on File Prior to Visit  Medication Sig Dispense Refill  . amLODipine (NORVASC) 5 MG tablet Take 1 tablet (5 mg total) by mouth daily. 90 tablet 1  . Aspirin-Acetaminophen-Caffeine (EXCEDRIN MIGRAINE PO) Take 2 tablets by mouth 2 (two) times daily as needed.    [name] azelastine (ASTELIN) 0.1 % nasal spray Place 1 spray into both nostrils daily. Use in each nostril as directed 30 mL 12  . azelastine (OPTIVAR) 0.05 % ophthalmic solution 1 drop 2 (two) times daily as needed.  6 mL 12  . buPROPion (WELLBUTRIN SR) 150 MG 12 hr tablet Take 1 tablet (150 mg total) by mouth 2 (two) times daily.    [name] ibuprofen (ADVIL,MOTRIN) 600 MG tablet Take 600 mg by mouth every 6 (six) hours as needed.    [name] levocetirizine (XYZAL) 5 MG tablet TAKE 1 TABLET BY MOUTH EVERY EVENING. 90 tablet 1  . metoprolol succinate (TOPROL-XL) 25 MG 24 hr tablet Take 1 tablet (25 mg total) by mouth daily. 90 tablet 1  . montelukast (SINGULAIR) 10 MG tablet TAKE 1 TABLET (10 MG TOTAL) BY MOUTH AT BEDTIME. 90 tablet 1  . ondansetron (ZOFRAN-ODT) 4 MG disintegrating tablet Take 1 tablet (4 mg total) by mouth every 8 (eight) hours as needed for nausea. 30 tablet 3  . rizatriptan (MAXALT-MLT) 10 MG disintegrating tablet Take 1 tablet (10 mg total) by mouth as needed for migraine. May repeat in 2 hours if needed 9 tablet 11  . topiramate (TOPAMAX) 25 MG tablet TAKE 1 TABLET 2 TIMES DAILY BY MOUTH 120 tablet 3   No current facility-administered medications on file prior to visit.     BP 129/83   Pulse 70   Wt 205 lb (93 kg)   BMI 35.19 kg/m    Observations/Objective:   Gen: Awake, alert, no acute distress Resp: Breathing is even and non-labored Psych: calm/pleasant demeanor Neuro: Alert and Oriented x 3, + facial symmetry, speech is clear.   Assessment and Plan:  Hypertension-blood pressure stable on  current medications.  Continue same.  Migraines-stable, management per neurology.  Obesity-we discussed importance of healthy diet, exercise, and weight loss.  I advised the patient  to try restarting Wellbutrin.  Allergic rhinitis-stable on current regimen.  Continue same.  Follow Up Instructions:    I discussed the assessment and treatment plan with the patient. The patient was provided an opportunity to ask questions and all were answered. The patient agreed with the plan and demonstrated an understanding of the instructions.   The patient was advised to call back or seek an in-person evaluation if the symptoms worsen or if the condition fails to improve as anticipated.  Nance Pear, NP

## 2019-06-14 ENCOUNTER — Ambulatory Visit (INDEPENDENT_AMBULATORY_CARE_PROVIDER_SITE_OTHER): Payer: No Typology Code available for payment source | Admitting: Family

## 2019-06-14 ENCOUNTER — Ambulatory Visit: Payer: Self-pay | Admitting: Family

## 2019-06-14 ENCOUNTER — Emergency Department (HOSPITAL_BASED_OUTPATIENT_CLINIC_OR_DEPARTMENT_OTHER): Payer: No Typology Code available for payment source

## 2019-06-14 ENCOUNTER — Emergency Department (HOSPITAL_BASED_OUTPATIENT_CLINIC_OR_DEPARTMENT_OTHER)
Admission: EM | Admit: 2019-06-14 | Discharge: 2019-06-14 | Disposition: A | Discharge status: Home / Self Care | Payer: No Typology Code available for payment source | Attending: Emergency Medicine | Admitting: Emergency Medicine

## 2019-06-14 ENCOUNTER — Other Ambulatory Visit: Payer: Self-pay

## 2019-06-14 ENCOUNTER — Encounter (HOSPITAL_BASED_OUTPATIENT_CLINIC_OR_DEPARTMENT_OTHER): Payer: Self-pay | Admitting: Emergency Medicine

## 2019-06-14 ENCOUNTER — Other Ambulatory Visit: Payer: Self-pay | Admitting: Neurology

## 2019-06-14 DIAGNOSIS — N200 Calculus of kidney: Secondary | ICD-10-CM

## 2019-06-14 DIAGNOSIS — R1032 Left lower quadrant pain: Secondary | ICD-10-CM | POA: Diagnosis present

## 2019-06-14 DIAGNOSIS — Z79899 Other long term (current) drug therapy: Secondary | ICD-10-CM | POA: Diagnosis not present

## 2019-06-14 DIAGNOSIS — N202 Calculus of kidney with calculus of ureter: Secondary | ICD-10-CM | POA: Insufficient documentation

## 2019-06-14 DIAGNOSIS — I10 Essential (primary) hypertension: Secondary | ICD-10-CM | POA: Insufficient documentation

## 2019-06-14 DIAGNOSIS — R1084 Generalized abdominal pain: Secondary | ICD-10-CM

## 2019-06-14 LAB — COMPREHENSIVE METABOLIC PANEL
ALT: 20 U/L (ref 0–44)
AST: 24 U/L (ref 15–41)
Albumin: 4.3 g/dL (ref 3.5–5.0)
Alkaline Phosphatase: 67 U/L (ref 38–126)
Anion gap: 9 (ref 5–15)
BUN: 24 mg/dL — ABNORMAL HIGH (ref 6–20)
CO2: 22 mmol/L (ref 22–32)
Calcium: 9.3 mg/dL (ref 8.9–10.3)
Chloride: 108 mmol/L (ref 98–111)
Creatinine, Ser: 1.08 mg/dL — ABNORMAL HIGH (ref 0.44–1.00)
GFR calc Af Amer: >60 mL/min (ref >60)
GFR calc non Af Amer: >60 mL/min (ref >60)
Glucose, Bld: 132 mg/dL — ABNORMAL HIGH (ref 70–99)
Potassium: 4.1 mmol/L (ref 3.5–5.1)
Sodium: 139 mmol/L (ref 135–145)
Total Bilirubin: 0.6 mg/dL (ref 0.3–1.2)
Total Protein: 7.2 g/dL (ref 6.5–8.1)

## 2019-06-14 LAB — CBC
HCT: 45 % (ref 36.0–46.0)
Hemoglobin: 13.9 g/dL (ref 12.0–15.0)
MCH: 27.4 pg (ref 26.0–34.0)
MCHC: 30.9 g/dL (ref 30.0–36.0)
MCV: 88.6 fL (ref 80.0–100.0)
Platelets: 239 10*3/uL (ref 150–400)
RBC: 5.08 MIL/uL (ref 3.87–5.11)
RDW: 13.5 % (ref 11.5–15.5)
WBC: 10.5 10*3/uL (ref 4.0–10.5)
nRBC: 0 % (ref 0.0–0.2)

## 2019-06-14 LAB — URINALYSIS, MICROSCOPIC (REFLEX)

## 2019-06-14 LAB — LIPASE, BLOOD: Lipase: 32 U/L (ref 11–51)

## 2019-06-14 LAB — URINALYSIS, ROUTINE W REFLEX MICROSCOPIC
Bilirubin Urine: NEGATIVE
Glucose, UA: NEGATIVE mg/dL
Ketones, ur: 15 mg/dL — AB
Leukocytes,Ua: NEGATIVE
Nitrite: NEGATIVE
Protein, ur: NEGATIVE mg/dL
Specific Gravity, Urine: 1.025 (ref 1.005–1.030)
pH: 6.5 (ref 5.0–8.0)

## 2019-06-14 LAB — PREGNANCY, URINE: Preg Test, Ur: NEGATIVE

## 2019-06-14 MED ORDER — IOHEXOL 300 MG/ML  SOLN
100.0000 mL | Freq: Once | INTRAMUSCULAR | Status: AC | PRN
  Administered 2019-06-14: 13:00:00 100 mL via INTRAVENOUS

## 2019-06-14 MED ORDER — TAMSULOSIN HCL 0.4 MG PO CAPS: 0.4000 mg | ORAL_CAPSULE | Freq: Every day | ORAL | 0 refills | Status: AC

## 2019-06-14 MED ORDER — TRAMADOL HCL 50 MG PO TABS: 50.0000 mg | ORAL_TABLET | Freq: Four times a day (QID) | ORAL | 0 refills | Status: DC | PRN

## 2019-06-14 MED ORDER — ONDANSETRON HCL 4 MG/2ML IJ SOLN
4.0000 mg | Freq: Once | INTRAMUSCULAR | Status: AC
  Administered 2019-06-14: 4 mg via INTRAVENOUS
  Filled 2019-06-14: qty 2

## 2019-06-14 MED ORDER — KETOROLAC TROMETHAMINE 30 MG/ML IJ SOLN
30.0000 mg | Freq: Once | INTRAMUSCULAR | Status: AC
  Administered 2019-06-14: 30 mg via INTRAVENOUS
  Filled 2019-06-14: qty 1

## 2019-06-14 MED ORDER — SODIUM CHLORIDE 0.9 % IV BOLUS
1000.0000 mL | Freq: Once | INTRAVENOUS | Status: AC
  Administered 2019-06-14: 1000 mL via INTRAVENOUS

## 2019-06-14 MED ORDER — TAMSULOSIN HCL 0.4 MG PO CAPS: 0.4000 mg | ORAL_CAPSULE | Freq: Every day | ORAL | 0 refills | Status: DC

## 2019-06-14 MED ORDER — ONDANSETRON 4 MG PO TBDP: 4.0000 mg | ORAL_TABLET | Freq: Three times a day (TID) | ORAL | 0 refills | Status: DC | PRN

## 2019-06-14 MED FILL — TAMSULOSIN HCL 0.4 MG CAP: 0.4 | 14 days supply | Qty: 14 | Fill #0

## 2019-06-14 MED FILL — METOPROLOL SUCCINATE ER 25: 25 | 90 days supply | Qty: 90 | Fill #0

## 2019-06-14 MED FILL — traMADol HCL 50 MG TABS: 50 | 3 days supply | Qty: 12 | Fill #0

## 2019-06-14 MED FILL — AMLODIPINE BESYLATE 5 MG TA: 5 | 90 days supply | Qty: 90 | Fill #0

## 2019-06-14 MED FILL — LEVOCETIRIZINE 5 MG TABLET: 5 | 90 days supply | Qty: 90 | Fill #0

## 2019-06-14 MED FILL — ONDANSETRON ODT 4 MG TABLET: 4 | 6 days supply | Qty: 20 | Fill #0

## 2019-06-14 NOTE — ED Notes (Signed)
ED Provider at bedside. 

## 2019-06-14 NOTE — ED Notes (Signed)
Pt verbalized understanding of dc instructions.

## 2019-06-14 NOTE — ED Triage Notes (Signed)
Pt c/o LLQ abd pain with vomiting and diarrhea since yesterday. She is on her menstrual cycle.

## 2019-06-14 NOTE — Progress Notes (Signed)
Virtual Visit via Video Note  I connected with [name] on 06/14/19 at  8:40 AM EDT by a video enabled telemedicine application and verified that I am speaking with the correct person using two identifiers.  Location: Patient: home Provider: home   I discussed the limitations of evaluation and management by telemedicine and the availability of in person appointments. The patient expressed understanding and agreed to proceed.  History of Present Illness:  Patient is a 38 yr old female who presents today with chief complaint of Left sided lower abdominal pain. Pain began yesterday afternoon. Reports that it "almost feels like I need to use the bathroom."  Reports + diarrhea, mild nausea, no vomiting.    Pain is sharp and constant in nature. Rates pain as 7-8/10. Has period now- it began at the beginning of the week.  Uses condoms for birth control. Denies dysuria/frequency/hematuria.  Past Medical History:  Diagnosis Date  . Anxiety   . AVM (arteriovenous malformation)    leaky  . Back pain   . Hypertension   . Joint pain   . Migraines      Social History   Socioeconomic History  . Marital status: Married    Spouse name: Thad  . Number of children: 1  . Years of education: Not on file  . Highest education level: Associate degree: academic program  Occupational History  . Occupation: Therapist, sports  Social Needs  . Financial resource strain: Not on file  . Food insecurity    Worry: Not on file    Inability: Not on file  . Transportation needs    Medical: Not on file    Non-medical: Not on file  Tobacco Use  . Smoking status: Never Smoker  . Smokeless tobacco: Never Used  Substance and Sexual Activity  . Alcohol use: No    Alcohol/week: 0.0 standard drinks  . Drug use: No  . Sexual activity: Yes    Birth control/protection: Condom  Lifestyle  . Physical activity    Days per week: Not on file    Minutes per session: Not on file  . Stress: Not on file  Relationships  .  Social Herbalist on phone: Not on file    Gets together: Not on file    Attends religious service: Not on file    Active member of club or organization: Not on file    Attends meetings of clubs or organizations: Not on file    Relationship status: Not on file  . Intimate partner violence    Fear of current or ex partner: Not on file    Emotionally abused: Not on file    Physically abused: Not on file    Forced sexual activity: Not on file  Other Topics Concern  . 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 CESAREAN SECTION    . CRANIOTOMY  2008   left frontal AVM  . emobilization      Family History  Problem Relation Age of Onset  . Hypertension Father   . Alcohol abuse Father   . Arthritis Father   . Obesity Father   . Hypertension  Mother   . Arthritis Mother   . Migraines Mother   . Obesity Mother   . Cancer Mother        sinus tumor  . Migraines Sister   . Ulcerative colitis Sister   . Migraines Maternal Aunt   . ADD / ADHD Son   . Cancer Maternal Grandmother        colon diagnosed at age 38  . Dementia Maternal Grandmother   . Heart disease Neg Hx     Allergies  Allergen Reactions  . Codeine     REACTION: upset stomach once, but has taken it other times and been ok  . Lisinopril     cough  . Sulfonamide Derivatives Hives    Current Outpatient Medications on File Prior to Visit  Medication Sig Dispense Refill  . amLODipine (NORVASC) 5 MG tablet Take 1 tablet (5 mg total) by mouth daily. 90 tablet 1  . Aspirin-Acetaminophen-Caffeine (EXCEDRIN MIGRAINE PO) Take 2 tablets by mouth 2 (two) times daily as needed.    [name] azelastine (ASTELIN) 0.1 % nasal spray Place 1 spray into both nostrils daily. Use in  each nostril as directed 30 mL 12  . azelastine (OPTIVAR) 0.05 % ophthalmic solution 1 drop 2 (two) times daily as needed.  6 mL 12  . buPROPion (WELLBUTRIN SR) 150 MG 12 hr tablet Take 1 tablet (150 mg total) by mouth 2 (two) times daily.    [name] ibuprofen (ADVIL,MOTRIN) 600 MG tablet Take 600 mg by mouth every 6 (six) hours as needed.    [name] levocetirizine (XYZAL) 5 MG tablet TAKE 1 TABLET BY MOUTH EVERY EVENING. 90 tablet 1  . Magnesium 200 MG TABS Take by mouth.    . metoprolol succinate (TOPROL-XL) 25 MG 24 hr tablet Take 1 tablet (25 mg total) by mouth daily. 90 tablet 1  . montelukast (SINGULAIR) 10 MG tablet TAKE 1 TABLET (10 MG TOTAL) BY MOUTH AT BEDTIME. 90 tablet 1  . ondansetron (ZOFRAN-ODT) 4 MG disintegrating tablet Take 1 tablet (4 mg total) by mouth every 8 (eight) hours as needed for nausea. 30 tablet 3  . rizatriptan (MAXALT-MLT) 10 MG disintegrating tablet Take 1 tablet (10 mg total) by mouth as needed for migraine. May repeat in 2 hours if needed 9 tablet 11  . topiramate (TOPAMAX) 25 MG tablet TAKE 1 TABLET 2 TIMES DAILY BY MOUTH 120 tablet 3   No current facility-administered medications on file prior to visit.     There were no vitals taken for this visit.     Observations/Objective:   Gen: Awake, alert, appears uncomfortable Resp: Breathing is even and non-labored Psych: calm/pleasant demeanor Neuro: Alert and Oriented x 3, + facial symmetry, speech is clear.   Assessment and Plan:  LLQ pain- will obtain urine hcg/urinalysis.  Need to rule out pregnancy (?ectopic).  If hcg negative needs to proceed with CT abd/pelvis for further evaluation.  Differential includes ectopic pregnancy, ovarian cyst, kidney stone, diverticulitis, bowel obstruction, gastroenteritis. Will try to complete ASAP.  Pt verbalizes understanding.   Follow Up Instructions:    I discussed the assessment and treatment plan with the patient. The patient was provided an opportunity to ask  questions and all were answered. The patient agreed with the plan and demonstrated an understanding of the instructions.   The patient was advised to call back or seek an in-person evaluation if the symptoms worsen or if the condition fails to improve as anticipated.  Nance Pear, NP

## 2019-06-14 NOTE — Telephone Encounter (Signed)
Appt scheduled

## 2019-06-14 NOTE — Telephone Encounter (Signed)
Pt. Reports she started having abdominal pain pain yesterday. Hurts left lower quadrant. Pain is 5/10 and is constant now. No constipation, no fever or diarrhea. Has not had this type of pain before. Warm transfer to [facility]. Answer Assessment - Initial Assessment Questions 1. LOCATION: "Where does it hurt?"      Left lower side 2. RADIATION: "Does the pain shoot anywhere else?" (e.g., chest, back)     Side 3. ONSET: "When did the pain begin?" (e.g., minutes, hours or days ago)      Started yesterday 4. SUDDEN: "Gradual or sudden onset?"     Sudden 5. PATTERN "Does the pain come and go, or is it constant?"    - If constant: "Is it getting better, staying the same, or worsening?"      (Note: Constant means the pain never goes away completely; most serious pain is constant and it progresses)     - If intermittent: "How long does it last?" "Do you have pain now?"     (Note: Intermittent means the pain goes away completely between bouts)     Constant 6. SEVERITY: "How bad is the pain?"  (e.g., Scale 1-10; mild, moderate, or severe)   - MILD (1-3): doesn't interfere with normal activities, abdomen soft and not tender to touch    - MODERATE (4-7): interferes with normal activities or awakens from sleep, tender to touch    - SEVERE (8-10): excruciating pain, doubled over, unable to do any normal activities      5 7. RECURRENT SYMPTOM: "Have you ever had this type of abdominal pain before?" If so, ask: "When was the last time?" and "What happened that time?"      No 8. CAUSE: "What do you think is causing the abdominal pain?"     Unsure 9. RELIEVING/AGGRAVATING FACTORS: "What makes it better or worse?" (e.g., movement, antacids, bowel movement)     No 10. OTHER SYMPTOMS: "Has there been any vomiting, diarrhea, constipation, or urine problems?"       Took Magnesium 11. PREGNANCY: "Is there any chance you are pregnant?" "When was your last menstrual period?"       No  Protocols used: ABDOMINAL  PAIN - [facility]

## 2019-06-14 NOTE — ED Provider Notes (Signed)
[facility] EMERGENCY DEPARTMENT Provider Note   CSN: [id number] Arrival date & time: 06/14/19  9629    History   Chief Complaint Chief Complaint  Patient presents with  . Abdominal Pain    HPI Kaitlin Foster is a 38 y.o. female.     HPI   38yo female with hx of htn, AVM, migraines, presents with concern for left sided abdominal pain, nausea, vomiting and diarrhea.  Reports abdominal pain began yesterday with greater than 4 episodes of emesis. Is LLQ and radiating upwards.  Severe pain. Later developed diarrhea. No recent abx, no sick contacts. No fevers, cough, loss of taste or smell.  Denies urinary symptoms, vaginal bleeding or discharge.  No known hx of nephrolithiasis or ovarian cyst, told she may have fibroids by OB on exam.    Past Medical History:  Diagnosis Date  . Anxiety   . AVM (arteriovenous malformation)    leaky  . Back pain   . Hypertension   . Joint pain   . Migraines     Patient Active Problem List   Diagnosis Date Noted  . Chronic migraine without aura, with intractable migraine, so stated, with status migrainosus 06/13/2018  . Pansinusitis 05/24/2018  . Right acute serous otitis media 05/24/2018  . Bacterial pharyngitis 11/22/2017  . Vitamin D deficiency 08/30/2017  . 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 Class 1 obesity with serious comorbidity and body mass index (BMI) of 30.0 to 30.9 in adult 08/30/2017  . Class 1 obesity with serious comorbidity and body mass index (BMI) of 31.0 to 31.9 in adult 07/01/2017  . Other fatigue 07/01/2017  . Shortness of breath on exertion 07/01/2017  . Depression screening 07/01/2017  . Other hyperlipidemia 07/01/2017  . Essential hypertension 07/01/2017  . Hand, foot and mouth disease 07/30/2015  . Spontaneous tension pneumothorax 07/11/2015  . Allergic rhinitis 06/25/2015  . Rash 06/25/2015  . Lumbar strain 02/12/2015  . 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 Anemia 03/21/2014  . Migraine 11/18/2012  . General  medical examination 02/29/2012  . Anxiety and depression 02/29/2012  . ESSENTIAL HYPERTENSION, BENIGN 11/15/2009  . DYSLIPIDEMIA 10/12/2008    Past Surgical History:  Procedure Laterality Date  . CESAREAN SECTION    . CRANIOTOMY  2008   left frontal AVM  . emobilization       OB History    Gravida  1   Para      Term      Preterm      AB      Living  1     SAB      TAB      Ectopic      Multiple      Live Births               Home Medications    Prior to Admission medications   Medication Sig Start Date End Date Taking? Authorizing Provider  amLODipine (NORVASC) 5 MG tablet Take 1 tablet (5 mg total) by mouth daily. 11/14/18   [name], NP  Aspirin-Acetaminophen-Caffeine (EXCEDRIN MIGRAINE PO) Take 2 tablets by mouth 2 (two) times daily as needed.    [provider]  azelastine (ASTELIN) 0.1 % nasal spray Place 1 spray into both nostrils daily. Use in each nostril as directed 06/26/16   [name], NP  azelastine (OPTIVAR) 0.05 % ophthalmic solution 1 drop 2 (two) times daily as needed.  06/30/16   [name], NP  buPROPion [facility] SR) 150 MG 12  hr tablet TAKE 1 TABLET BY MOUTH TWICE A DAY 06/15/19   Melvenia Beam, MD  ibuprofen (ADVIL,MOTRIN) 600 MG tablet Take 600 mg by mouth every 6 (six) hours as needed.    [provider]  levocetirizine (XYZAL) 5 MG tablet TAKE 1 TABLET BY MOUTH EVERY EVENING. 12/14/18   [name], NP  Magnesium 200 MG TABS Take by mouth.    [provider]  metoprolol succinate (TOPROL-XL) 25 MG 24 hr tablet Take 1 tablet (25 mg total) by mouth daily. 11/14/18   [name], NP  montelukast (SINGULAIR) 10 MG tablet TAKE 1 TABLET (10 MG TOTAL) BY MOUTH AT BEDTIME. 08/19/18   [name], NP  ondansetron (ZOFRAN ODT) 4 MG disintegrating tablet Take 1 tablet (4 mg total) by mouth every 8 (eight) hours as needed for nausea or vomiting. 06/14/19   Gareth Morgan,  MD  rizatriptan (MAXALT-MLT) 10 MG disintegrating tablet Take 1 tablet (10 mg total) by mouth as needed for migraine. May repeat in 2 hours if needed 05/02/19   Melvenia Beam, MD  tamsulosin (FLOMAX) 0.4 MG CAPS capsule Take 1 capsule (0.4 mg total) by mouth daily for 14 days. 06/14/19 06/28/19  Gareth Morgan, MD  topiramate (TOPAMAX) 25 MG tablet TAKE 1 TABLET 2 TIMES DAILY BY MOUTH 12/14/18   Melvenia Beam, MD  traMADol (ULTRAM) 50 MG tablet Take 1 tablet (50 mg total) by mouth every 6 (six) hours as needed. 06/14/19   Gareth Morgan, MD    Family History Family History  Problem Relation Age of Onset  . Hypertension Father   . Alcohol abuse Father   . Arthritis Father   . Obesity Father   . Hypertension Mother   . Arthritis Mother   . Migraines Mother   . Obesity Mother   . Cancer Mother        sinus tumor  . Migraines Sister   . Ulcerative colitis Sister   . Migraines Maternal Aunt   . ADD / ADHD Son   . Cancer Maternal Grandmother        colon diagnosed at age 28  . Dementia Maternal Grandmother   . Heart disease Neg Hx     Social History Social History   Tobacco Use  . Smoking status: Never Smoker  . Smokeless tobacco: Never Used  Substance Use Topics  . Alcohol use: No    Alcohol/week: 0.0 standard drinks  . Drug use: No     Allergies   Codeine, Lisinopril, and Sulfonamide derivatives   Review of Systems Review of Systems  Constitutional: Positive for fatigue. Negative for fever.  HENT: Negative for congestion.   Respiratory: Negative for cough and shortness of breath.   Cardiovascular: Negative for chest pain.  Gastrointestinal: Positive for abdominal pain, diarrhea, nausea and vomiting.  Genitourinary: Positive for flank pain. Negative for difficulty urinating and dysuria.  Skin: Negative for rash.  Neurological: Negative for headaches.     Physical Exam Updated Vital Signs BP 135/89 (BP Location: Right Arm)   Pulse 77   Temp 98.5 F (36.9  C) (Oral)   Resp 20   Ht 5\' 3"  (1.6 m)   Wt 93 kg   LMP 06/12/2019   SpO2 99%   BMI 36.31 kg/m   Physical Exam Vitals signs and nursing note reviewed.  Constitutional:      General: She is in acute distress (in pain).     Appearance: She is well-developed. She is not diaphoretic.  HENT:  Head: Normocephalic and atraumatic.  Eyes:     Conjunctiva/sclera: Conjunctivae normal.  Neck:     Musculoskeletal: Normal range of motion.  Cardiovascular:     Rate and Rhythm: Normal rate and regular rhythm.  Pulmonary:     Effort: Pulmonary effort is normal. No respiratory distress.  Abdominal:     General: There is no distension.     Palpations: Abdomen is soft.     Tenderness: There is abdominal tenderness in the left lower quadrant. There is no guarding.  Musculoskeletal:        General: No tenderness.  Skin:    General: Skin is warm and dry.     Findings: No erythema or rash.  Neurological:     Mental Status: She is alert and oriented to person, place, and time.      ED Treatments / Results  Labs (all labs ordered are listed, but only abnormal results are displayed) Labs Reviewed  URINALYSIS, ROUTINE W REFLEX MICROSCOPIC - Abnormal; Notable for the following components:      Result Value   APPearance CLOUDY (*)    Hgb urine dipstick LARGE (*)    Ketones, ur 15 (*)    All other components within normal limits  COMPREHENSIVE METABOLIC PANEL - Abnormal; Notable for the following components:   Glucose, Bld 132 (*)    BUN 24 (*)    Creatinine, Ser 1.08 (*)    All other components within normal limits  URINALYSIS, MICROSCOPIC (REFLEX) - Abnormal; Notable for the following components:   Bacteria, UA RARE (*)    All other components within normal limits  PREGNANCY, URINE  LIPASE, BLOOD  CBC    EKG None  Radiology Ct Abdomen Pelvis W Contrast  Result Date: 06/14/2019 CLINICAL DATA:  Abdominal pain, LEFT lower quadrant pain with vomiting and diarrhea since  yesterday, question diverticulitis, history hypertension EXAM: CT ABDOMEN AND PELVIS WITH CONTRAST TECHNIQUE: Multidetector CT imaging of the abdomen and pelvis was performed using the standard protocol following bolus administration of intravenous contrast. Sagittal and coronal MPR images reconstructed from axial data set. CONTRAST:  143mL OMNIPAQUE IOHEXOL 300 MG/ML SOLN IV. Dilute oral contrast. COMPARISON:  None FINDINGS: Lower chest: Lung bases clear Hepatobiliary: Gallbladder and liver normal appearance Pancreas: Normal appearance Spleen: Normal appearance Adrenals/Urinary Tract: Adrenal glands normal appearance. RIGHT kidney, ureter and bladder normal appearance. LEFT hydronephrosis and hydroureter with delay in LEFT nephrogram. Obstructing 3 mm calculus in distal LEFT ureter very near the ureterovesical junction. Additional tiny nonobstructing calculus mid to upper LEFT kidney. Stomach/Bowel: Normal appendix. Small hiatal hernia. Stomach and bowel loops otherwise normal appearance. Vascular/Lymphatic: Vascular structures patent.  No adenopathy. Reproductive: Large mass within upper uterus likely leiomyoma 6.5 x 6.6 x 6.4 cm. Minimal endometrial fluid. Unremarkable ovaries. Other: No free air or free fluid. No hernia or inflammatory process. Musculoskeletal: Unremarkable IMPRESSION: LEFT hydronephrosis and hydroureter secondary to a 3 mm distal LEFT ureteral calculus very near the ureterovesical junction. Additional tiny nonobstructing LEFT renal calculus. Large probable leiomyoma of the upper uterus 6.6 cm greatest size. Electronically Signed   By: Lavonia Dana M.D.   On: 06/14/2019 13:32    Procedures Procedures (including critical care time)  Medications Ordered in ED Medications  ketorolac (TORADOL) 30 MG/ML injection 30 mg (30 mg Intravenous Given 06/14/19 1105)  sodium chloride 0.9 % bolus 1,000 mL (0 mLs Intravenous Stopped 06/14/19 1211)  ondansetron (ZOFRAN) injection 4 mg (4 mg Intravenous  Given 06/14/19 1105)  iohexol (OMNIPAQUE) 300 MG/ML  solution 100 mL (100 mLs Intravenous Contrast Given 06/14/19 1313)     Initial Impression / Assessment and Plan / ED Course  I have reviewed the triage vital signs and the nursing notes.  Pertinent labs & imaging results that were available during my care of the patient were reviewed by me and considered in my medical decision making (see chart for details).        38yo female with hx of htn, AVM, migraines, presents with concern for left sided abdominal pain, nausea, vomiting and diarrhea. Preg negative. Labs without significant abnormalities.  CT done to evaluate for signs of diverticulitis or nephrolithiasis and shows left-sided hydronephrosis and hydroureter secondary to a 3 mm distal left ureteral calculus near the UVJ, also shows likely fibroid with unremarkable ovaries.  Pain improved following fluids and Toradol in the emergency department.  Recommend hydration, primarily NSAIDs for kidney stone pain, but also recommend Tylenol and given prescription for tramadol with discussion of risks.  Reviewed patient in [place] drug database. Gave urology number for follow-up.  Pain controlled, no sign of urinary tract infection. Patient discharged in stable condition with understanding of reasons to return.    Final Clinical Impressions(s) / ED Diagnoses   Final diagnoses:  Nephrolithiasis    ED Discharge Orders         Ordered    tamsulosin (FLOMAX) 0.4 MG CAPS capsule  Daily,   Status:  Discontinued     06/14/19 1353    ondansetron (ZOFRAN ODT) 4 MG disintegrating tablet  Every 8 hours PRN,   Status:  Discontinued     06/14/19 1353    traMADol (ULTRAM) 50 MG tablet  Every 6 hours PRN,   Status:  Discontinued     06/14/19 1353    ondansetron (ZOFRAN ODT) 4 MG disintegrating tablet  Every 8 hours PRN     06/14/19 1407    tamsulosin (FLOMAX) 0.4 MG CAPS capsule  Daily     06/14/19 1407    traMADol (ULTRAM) 50 MG tablet  Every 6  hours PRN     06/14/19 1407           Gareth Morgan, MD 06/16/19 [phone]

## 2019-06-15 NOTE — Telephone Encounter (Signed)
LVM on pt's cell asking for cb or mychart message to clarify if she is still taking this. Last refill noted above 01/20/2019.

## 2019-06-15 NOTE — Telephone Encounter (Signed)
Pt messaged in [place]. She had stopped it for awhile but is restarting BID.

## 2019-06-16 ENCOUNTER — Other Ambulatory Visit: Payer: Self-pay

## 2019-06-16 ENCOUNTER — Encounter (HOSPITAL_BASED_OUTPATIENT_CLINIC_OR_DEPARTMENT_OTHER): Payer: Self-pay | Admitting: *Deleted

## 2019-06-16 ENCOUNTER — Other Ambulatory Visit: Payer: Self-pay | Admitting: Urology

## 2019-06-16 ENCOUNTER — Encounter: Payer: Self-pay | Admitting: Family

## 2019-06-16 MED FILL — HYDROCODON-APAP 5-325: 5-325 | 3 days supply | Qty: 15 | Fill #0

## 2019-06-16 MED FILL — KETOROLAC 10 MG TABLET: 10 | 4 days supply | Qty: 15 | Fill #0

## 2019-06-16 NOTE — Progress Notes (Signed)
Spoke w/ pt via phone for pre-op interview.  Npo after mn.  Arrive at 0730.  Current lab results dated 06-14-2019 in epic and chart (cbc, cmp, negative urine preg.)  Needs EKG.  Pt getting covid test done 06-17-2019.  Will take norvasc, toprol and wellbutrin am dos w/ sips of water.  May taked take maxalt/ zofran/ vicodone if needed.

## 2019-06-17 ENCOUNTER — Other Ambulatory Visit (HOSPITAL_COMMUNITY)
Admission: RE | Admit: 2019-06-17 | Discharge: 2019-06-17 | Disposition: A | Discharge status: Home / Self Care | Payer: No Typology Code available for payment source | Source: Ambulatory Visit | Attending: Urology | Admitting: Urology

## 2019-06-17 DIAGNOSIS — Z20828 Contact with and (suspected) exposure to other viral communicable diseases: Secondary | ICD-10-CM | POA: Insufficient documentation

## 2019-06-17 DIAGNOSIS — Z01812 Encounter for preprocedural laboratory examination: Secondary | ICD-10-CM | POA: Diagnosis present

## 2019-06-18 LAB — SARS CORONAVIRUS 2 (TAT 6-24 HRS): SARS Coronavirus 2: NEGATIVE

## 2019-06-20 ENCOUNTER — Ambulatory Visit (HOSPITAL_BASED_OUTPATIENT_CLINIC_OR_DEPARTMENT_OTHER)
Admission: RE | Admit: 2019-06-20 | Payer: No Typology Code available for payment source | Source: Home / Self Care | Admitting: Urology

## 2019-06-20 HISTORY — DX: Gastro-esophageal reflux disease without esophagitis: K21.9

## 2019-06-20 HISTORY — DX: Calculus of ureter: N20.1

## 2019-06-20 HISTORY — DX: Personal history of (corrected) congenital malformations of heart and circulatory system: Z87.74

## 2019-06-20 HISTORY — DX: Reserved for concepts with insufficient information to code with codable children: IMO0002

## 2019-06-20 HISTORY — DX: Leiomyoma of uterus, unspecified: D25.9

## 2019-06-20 HISTORY — DX: Calculus of kidney: N20.0

## 2019-06-20 SURGERY — CYSTOSCOPY/URETEROSCOPY/HOLMIUM LASER/STENT PLACEMENT
Anesthesia: General | Laterality: Left

## 2019-07-26 MED FILL — BUPROPION HCL ER (SR) 150 M: 150 | 30 days supply | Qty: 60 | Fill #0

## 2019-07-26 MED FILL — RIZATRIPTAN 10 MG ODT: 10 | 30 days supply | Qty: 12 | Fill #1

## 2019-08-21 ENCOUNTER — Other Ambulatory Visit: Payer: Self-pay

## 2019-08-22 ENCOUNTER — Other Ambulatory Visit: Payer: Self-pay

## 2019-08-22 ENCOUNTER — Ambulatory Visit (INDEPENDENT_AMBULATORY_CARE_PROVIDER_SITE_OTHER): Payer: No Typology Code available for payment source | Admitting: Family

## 2019-08-22 ENCOUNTER — Encounter: Payer: Self-pay | Admitting: Family

## 2019-08-22 VITALS — BP 123/79 | HR 78 | Temp 97.8°F | Resp 16 | Ht 63.5 in | Wt 202.0 lb

## 2019-08-22 DIAGNOSIS — Z23 Encounter for immunization: Secondary | ICD-10-CM

## 2019-08-22 DIAGNOSIS — R739 Hyperglycemia, unspecified: Secondary | ICD-10-CM

## 2019-08-22 DIAGNOSIS — Z0000 Encounter for general adult medical examination without abnormal findings: Secondary | ICD-10-CM | POA: Diagnosis not present

## 2019-08-22 LAB — CBC WITH DIFFERENTIAL/PLATELET
Basophils Absolute: 0 10*3/uL (ref 0.0–0.1)
Basophils Relative: 0.6 % (ref 0.0–3.0)
Eosinophils Absolute: 0.1 10*3/uL (ref 0.0–0.7)
Eosinophils Relative: 1.9 % (ref 0.0–5.0)
HCT: 40.5 % (ref 36.0–46.0)
Hemoglobin: 13.2 g/dL (ref 12.0–15.0)
Lymphocytes Relative: 25 % (ref 12.0–46.0)
Lymphs Abs: 1.9 10*3/uL (ref 0.7–4.0)
MCHC: 32.5 g/dL (ref 30.0–36.0)
MCV: 84 fl (ref 78.0–100.0)
Monocytes Absolute: 0.6 10*3/uL (ref 0.1–1.0)
Monocytes Relative: 7.8 % (ref 3.0–12.0)
Neutro Abs: 4.8 10*3/uL (ref 1.4–7.7)
Neutrophils Relative %: 64.7 % (ref 43.0–77.0)
Platelets: 278 10*3/uL (ref 150.0–400.0)
RBC: 4.83 Mil/uL (ref 3.87–5.11)
RDW: 13.7 % (ref 11.5–15.5)
WBC: 7.5 10*3/uL (ref 4.0–10.5)

## 2019-08-22 LAB — HEPATIC FUNCTION PANEL
ALT: 14 U/L (ref 0–35)
AST: 16 U/L (ref 0–37)
Albumin: 4.3 g/dL (ref 3.5–5.2)
Alkaline Phosphatase: 78 U/L (ref 39–117)
Bilirubin, Direct: 0.1 mg/dL (ref 0.0–0.3)
Total Bilirubin: 0.5 mg/dL (ref 0.2–1.2)
Total Protein: 6.8 g/dL (ref 6.0–8.3)

## 2019-08-22 LAB — BASIC METABOLIC PANEL
BUN: 16 mg/dL (ref 6–23)
CO2: 27 mEq/L (ref 19–32)
Calcium: 9.2 mg/dL (ref 8.4–10.5)
Chloride: 104 mEq/L (ref 96–112)
Creatinine, Ser: 0.76 mg/dL (ref 0.40–1.20)
GFR: 85.7 mL/min (ref >60.00)
Glucose, Bld: 76 mg/dL (ref 70–99)
Potassium: 4.4 mEq/L (ref 3.5–5.1)
Sodium: 138 mEq/L (ref 135–145)

## 2019-08-22 LAB — TSH: TSH: 1.8 u[IU]/mL (ref 0.35–4.50)

## 2019-08-22 LAB — LIPID PANEL
Cholesterol: 151 mg/dL (ref 0–200)
HDL: 27.5 mg/dL — ABNORMAL LOW (ref >39.00)
LDL Cholesterol: 99 mg/dL (ref 0–99)
NonHDL: 123.73
Total CHOL/HDL Ratio: 5
Triglycerides: 125 mg/dL (ref 0.0–149.0)
VLDL: 25 mg/dL (ref 0.0–40.0)

## 2019-08-22 LAB — HEMOGLOBIN A1C: Hgb A1c MFr Bld: 5.7 % (ref 4.6–6.5)

## 2019-08-22 NOTE — Progress Notes (Signed)
Subjective:    Patient ID: Kaitlin Foster, female    DOB: [birth year], 38 y.o.   MRN: [id number]  HPI  Patient presents today for complete physical.  Immunizations: tetanus 2015, flu shot today Wt Readings from Last 3 Encounters:  08/22/19 202 lb (91.6 kg)  06/14/19 205 lb (93 kg)  05/17/19 205 lb (93 kg)  Diet: fair diet Exercise:  Plans to start exercising.   Pap Smear: 03/10/19  Vision: due Dental:  Due in January    Review of Systems  Constitutional: Negative for unexpected weight change.  HENT: Negative for hearing loss and rhinorrhea.   Eyes: Negative for visual disturbance.  Respiratory: Negative for cough and shortness of breath.   Cardiovascular: Negative for leg swelling.  Gastrointestinal: Positive for constipation (occasional- uses miralax). Negative for diarrhea.  Genitourinary: Negative for dysuria, frequency and hematuria.  Musculoskeletal: Positive for back pain. Negative for arthralgias.  Skin: Negative for rash.  Neurological: Positive for headaches (rare migraines).  Hematological: Negative for adenopathy.  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;   2 stage embolization ACA supply to AVM @ [facility]  Sept 11th and 18th 2008 then craniotomy resection AVM 07-28-2007   Hypertension    Left ureteral stone    Migraines    Nephrolithiasis    left renal nonobstructive stone per CT 06-14-2019   Uterine fibroid      Social History   Socioeconomic History   Marital status: Married    Spouse name: Thad   Number of  children: 1   Years of education: Not on file   Highest education level: Associate degree: academic program  Occupational History   Occupation: Designer, multimedia strain: Not on file   Food insecurity    Worry: Not on file    Inability: Not on Lexicographer needs    Medical: Not on file    Non-medical: Not on file  Tobacco Use   Smoking status: Never Smoker   Smokeless tobacco: Never Used  Substance and Sexual Activity   Alcohol use: No    Alcohol/week: 0.0 standard drinks   Drug use: No   Sexual activity: Yes    Birth control/protection: Condom  Lifestyle   Physical activity    Days per week: Not on file    Minutes per session: Not on file   Stress: Not on file  Relationships   Social connections    Talks on phone: Not on file    Gets together: Not on file    Attends religious service: Not on file    Active member of club or organization: Not on file    Attends meetings of clubs or organizations: Not on file    Relationship status: Not on file   Intimate partner violence    Fear of current or ex partner: Not on file    Emotionally abused: Not on file    Physically abused: Not on file    Forced sexual activity:  Not on file  Other Topics Concern   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)   Married   Lives at home with her husband and child       Past Surgical History:  Procedure Laterality Date   CEREBRAL EMBOLIZATION  Sept 11th and 18th, 2008   @[facility]    2 staged embolization left ACA supply to AVM   CESAREAN SECTION  07/06/2009   CRANIOTOMY  07-29-2007   @ [facility]   resection left frontal AVM    Family History  Problem Relation Age of Onset   Hypertension Father    Alcohol abuse Father    Arthritis Father    Obesity Father    Hypertension Mother    Arthritis Mother     Migraines Mother    Obesity Mother    Cancer Mother        sinus tumor   Migraines Sister    Ulcerative colitis Sister    Migraines Maternal Aunt    ADD / ADHD Son    Cancer Maternal Grandmother        colon diagnosed at age 44   Dementia Maternal Grandmother    Heart disease Neg Hx     Allergies  Allergen Reactions   Codeine Other (See Comments)    REACTION: upset stomach once, but has taken it other times and been ok   Lisinopril Other (See Comments)    cough   Sulfa Antibiotics Hives    Current Outpatient Medications on File Prior to Visit  Medication Sig Dispense Refill   amLODipine (NORVASC) 5 MG tablet Take 1 tablet (5 mg total) by mouth daily. (Patient taking differently: Take 5 mg by mouth daily. ) 90 tablet 1   Aspirin-Acetaminophen-Caffeine (EXCEDRIN MIGRAINE PO) Take 2 tablets by mouth 2 (two) times daily as needed.     buPROPion (WELLBUTRIN SR) 150 MG 12 hr tablet TAKE 1 TABLET BY MOUTH TWICE A DAY 60 tablet 6   calcium carbonate (TUMS - DOSED IN MG ELEMENTAL CALCIUM) 500 MG chewable tablet Chew 1 tablet by mouth as needed for indigestion or heartburn.     fluticasone (FLONASE) 50 MCG/ACT nasal spray Place into both nostrils as needed for allergies or rhinitis.     ibuprofen (ADVIL) 200 MG tablet Take 200 mg by mouth every 6 (six) hours as needed.     levocetirizine (XYZAL) 5 MG tablet TAKE 1 TABLET BY MOUTH EVERY EVENING. 90 tablet 1   metoprolol succinate (TOPROL-XL) 25 MG 24 hr tablet Take 1 tablet (25 mg total) by mouth daily. (Patient taking differently: Take 25 mg by mouth daily. ) 90 tablet 1   montelukast (SINGULAIR) 10 MG tablet TAKE 1 TABLET (10 MG TOTAL) BY MOUTH AT BEDTIME. 90 tablet 1   ondansetron (ZOFRAN ODT) 4 MG disintegrating tablet Take 1 tablet (4 mg total) by mouth every 8 (eight) hours as needed for nausea or vomiting. 20 tablet 0   rizatriptan (MAXALT-MLT) 10 MG disintegrating tablet Take 1 tablet (10 mg total) by mouth as  needed for migraine. May repeat in 2 hours if needed 9 tablet 11   No current facility-administered medications on file prior to visit.     BP 123/79 (BP Location: Right Arm, Patient Position: Sitting, Cuff Size: Small)    Pulse 78    Temp  97.8 F (36.6 C) (Temporal)    Resp 16    Ht 5' 3.5" (1.613 m)    Wt 202 lb (91.6 kg)    SpO2 100%    BMI 35.22 kg/m    Objective:   Physical Exam Physical Exam  Constitutional: She is oriented to person, place, and time. She appears well-developed and well-nourished. No distress.  HENT:  Head: Normocephalic and atraumatic.  Right Ear: Tympanic membrane and ear canal normal.  Left Ear: Tympanic membrane and ear canal normal.  Mouth/Throat: Oropharynx is clear and moist.  Eyes: Pupils are equal, round, and reactive to light. No scleral icterus.  Neck: Normal range of motion. No thyromegaly present.  Cardiovascular: Normal rate and regular rhythm.   No murmur heard. Pulmonary/Chest: Effort normal and breath sounds normal. No respiratory distress. He has no wheezes. She has no rales. She exhibits no tenderness.  Abdominal: Soft. Bowel sounds are normal. She exhibits no distension and no mass. There is no tenderness. There is no rebound and no guarding.  Musculoskeletal: She exhibits no edema.  Lymphadenopathy:    She has no cervical adenopathy.  Neurological: She is alert and oriented to person, place, and time. She has normal patellar reflexes. She exhibits normal muscle tone. Coordination normal.  Skin: Skin is warm and dry.  Psychiatric: She has a normal mood and affect. Her behavior is normal. Judgment and thought content normal.  Breast/pelvic: deferred           Assessment & Plan:  Preventative care- discussed healthy diet, exercise, weight loss.  Pap up to date. Flu shot today.  Tetanus up to date. Obtain routine lab work.          Assessment & Plan:

## 2019-08-22 NOTE — Patient Instructions (Addendum)
Please complete lab work prior to leaving.  Continue to work on diet/exercise and weight loss.

## 2019-08-22 NOTE — Addendum Note (Signed)
Addended by: Kelle Darting A on: 08/22/2019 02:53 PM   Modules accepted: Orders

## 2019-09-06 ENCOUNTER — Ambulatory Visit (INDEPENDENT_AMBULATORY_CARE_PROVIDER_SITE_OTHER): Payer: No Typology Code available for payment source | Admitting: Neurology

## 2019-09-06 ENCOUNTER — Other Ambulatory Visit: Payer: Self-pay

## 2019-09-06 VITALS — Temp 97.8°F

## 2019-09-06 DIAGNOSIS — G43711 Chronic migraine without aura, intractable, with status migrainosus: Secondary | ICD-10-CM | POA: Diagnosis not present

## 2019-09-06 MED ORDER — NURTEC 75 MG PO TBDP: 75.0000 mg | ORAL_TABLET | Freq: Every day | ORAL | 6 refills | Status: DC | PRN

## 2019-09-06 MED ORDER — ZEMBRACE SYMTOUCH 3 MG/0.5ML ~~LOC~~ SOAJ: 3.0000 mg | SUBCUTANEOUS | 11 refills | Status: DC | PRN

## 2019-09-06 NOTE — Patient Instructions (Signed)
Nurtec once daily as needed Zembrace - try in place of maxalt  Sumatriptan injection What is this medicine? SUMATRIPTAN (soo ma TRIP tan) is used to treat migraines with or without aura. An aura is a strange feeling or visual disturbance that warns you of an attack. It is not used to prevent migraines. This medicine may be used for other purposes; ask your health care provider or pharmacist if you have questions. COMMON BRAND NAME(S): Alsuma, Imitrex, Imitrex STAT dose, Sumavel DosePro System What should I tell my health care provider before I take this medicine? They need to know if you have any of these conditions:  cigarette smoker  circulation problems in fingers and toes  diabetes  heart disease  high blood pressure  high cholesterol  history of irregular heartbeat  history of stroke  kidney disease  liver disease  stomach or intestine problems  an unusual or allergic reaction to sumatriptan, latex, other medicines, foods, dyes, or preservatives  pregnant or trying to get pregnant  breast-feeding How should I use this medicine? This medicine is for injection under the skin. You will be taught how to prepare and give this medicine. Use exactly as directed. Do not take your medicine more often than directed. Talk to your pediatrician regarding the use of this medicine in children. Special care may be needed. Overdosage: If you think you have taken too much of this medicine contact a poison control center or emergency room at once. NOTE: This medicine is only for you. Do not share this medicine with others. What if I miss a dose? This does not apply. This medicine is not for regular use. What may interact with this medicine? Do not take this medicine with any of the following medicines:  certain medicines for migraine headache like almotriptan, eletriptan, frovatriptan, naratriptan, rizatriptan, sumatriptan, zolmitriptan  ergot alkaloids like dihydroergotamine,  ergonovine, ergotamine, methylergonovine  MAOIs like Carbex, Eldepryl, Marplan, Nardil, and Parnate This medicine may also interact with the following medications:  certain medicines for depression, anxiety, or psychotic disorders This list may not describe all possible interactions. Give your health care provider a list of all the medicines, herbs, non-prescription drugs, or dietary supplements you use. Also tell them if you smoke, drink alcohol, or use illegal drugs. Some items may interact with your medicine. What should I watch for while using this medicine? Visit your healthcare professional for regular checks on your progress. Tell your healthcare professional if your symptoms do not start to get better or if they get worse. You may get drowsy or dizzy. Do not drive, use machinery, or do anything that needs mental alertness until you know how this medicine affects you. Do not stand up or sit up quickly, especially if you are an older patient. This reduces the risk of dizzy or fainting spells. Alcohol may interfere with the effect of this medicine. Tell your healthcare professional right away if you have any change in your eyesight. If you take migraine medicines for 10 or more days a month, your migraines may get worse. Keep a diary of headache days and medicine use. Contact your healthcare professional if your migraine attacks occur more frequently. What side effects may I notice from receiving this medicine? Side effects that you should report to your doctor or health care professional as soon as possible:  allergic reactions like skin rash, itching or hives, swelling of the face, lips, or tongue  changes in vision  chest pain or chest tightness  signs and symptoms  of a dangerous change in heartbeat or heart rhythm like chest pain; dizziness; fast, irregular heartbeat; palpitations; feeling faint or lightheaded; falls; breathing problems  signs and symptoms of a stroke like changes in  vision; confusion; trouble speaking or understanding; severe headaches; sudden numbness or weakness of the face, arm or leg; trouble walking; dizziness; loss of balance or coordination  signs and symptoms of serotonin syndrome like irritable; confusion; diarrhea; fast or irregular heartbeat; muscle twitching; stiff muscles; trouble walking; sweating; high fever; seizures; chills; vomiting Side effects that usually do not require medical attention (report to your doctor or health care professional if they continue or are bothersome):  diarrhea  dizziness  drowsiness  dry mouth  headache  nausea, vomiting  pain, redness, or irritation at site where injected  pain, tingling, numbness in the hands or feet  stomach pain This list may not describe all possible side effects. Call your doctor for medical advice about side effects. You may report side effects to FDA at 1-800-FDA-1088. Where should I keep my medicine? Keep out of the reach of children. You will be instructed on how to store this medicine. Throw away any unused medicine after the expiration date on the label. NOTE: This sheet is a summary. It may not cover all possible information. If you have questions about this medicine, talk to your doctor, pharmacist, or health care provider.  2020 Elsevier/Gold Standard (2018-03-11 12:58:21)

## 2019-09-06 NOTE — Progress Notes (Signed)
Consent Form Botulism Toxin Injection For Chronic Migraine  Interval history 09/06/2019: Baseline daily headaches and16 migraine days. Since last botox she has had more than 25 free headache days a month, exceptional improvement! Possibly 1 migraine a month only. +all. We are behind by 4 weeks and started getting migraines again which shows the botox is working excellently. Maxalt helps in a few hours.   No orders of the defined types were placed in this encounter.  Meds ordered this encounter  Medications  . Rimegepant Sulfate (NURTEC) 75 MG TBDP    Sig: Take 75 mg by mouth daily as needed. For migraines. Take as close to onset of migraine as possible. One daily maximum.    Dispense:  10 tablet    Refill:  6    Patient has copay card; she can have medication  regardless of insurance approval or copay amount.  . SUMAtriptan Succinate (ZEMBRACE SYMTOUCH) 3 MG/0.5ML SOAJ    Sig: Inject 3 mg into the skin as needed for up to 1 dose. May repeat in 15 mins. If needed,repeat in 2 hours. Max 4 injections daily.    Dispense:  8 pen    Refill:  11    Patient has copay card; she can have medication regardless of insurance approval or copay amount. Do not fill unless patient calls thanks    Reviewed orally with patient, additionally signature is on file:  Botulism toxin has been approved by the Federal drug administration for treatment of chronic migraine. Botulism toxin does not cure chronic migraine and it may not be effective in some patients.  The administration of botulism toxin is accomplished by injecting a small amount of toxin into the muscles of the neck and head. Dosage must be titrated for each individual. Any benefits resulting from botulism toxin tend to wear off after 3 months with a repeat injection required if benefit is to be maintained. Injections are usually done every 3-4 months with maximum effect peak achieved by about 2 or 3 weeks. Botulism toxin is expensive and you should  be sure of what costs you will incur resulting from the injection.  The side effects of botulism toxin use for chronic migraine may include:   -Transient, and usually mild, facial weakness with facial injections  -Transient, and usually mild, head or neck weakness with head/neck injections  -Reduction or loss of forehead facial animation due to forehead muscle weakness  -Eyelid drooping  -Dry eye  -Pain at the site of injection or bruising at the site of injection  -Double vision  -Potential unknown long term risks  Contraindications: You should not have Botox if you are pregnant, nursing, allergic to albumin, have an infection, skin condition, or muscle weakness at the site of the injection, or have myasthenia gravis, Lambert-Eaton syndrome, or ALS.  It is also possible that as with any injection, there may be an allergic reaction or no effect from the medication. Reduced effectiveness after repeated injections is sometimes seen and rarely infection at the injection site may occur. All care will be taken to prevent these side effects. If therapy is given over a long time, atrophy and wasting in the muscle injected may occur. Occasionally the patient's become refractory to treatment because they develop antibodies to the toxin. In this event, therapy needs to be modified.  I have read the above information and consent to the administration of botulism toxin.    BOTOX PROCEDURE NOTE FOR MIGRAINE HEADACHE    Contraindications and precautions discussed  with patient(above). Aseptic procedure was observed and patient tolerated procedure. Procedure performed by Dr. Georgia Dom  The condition has existed for more than 6 months, and pt does not have a diagnosis of ALS, Myasthenia Gravis or Lambert-Eaton Syndrome.  Risks and benefits of injections discussed and pt agrees to proceed with the procedure.  Written consent obtained  These injections are medically necessary. Pt  receives good benefits  from these injections. These injections do not cause sedations or hallucinations which the oral therapies may cause.  Description of procedure:  The patient was placed in a sitting position. The standard protocol was used for Botox as follows, with 5 units of Botox injected at each site:   -Procerus muscle, midline injection  -Corrugator muscle, bilateral injection  -Frontalis muscle, bilateral injection, with 2 sites each side, medial injection was performed in the upper one third of the frontalis muscle, in the region vertical from the medial inferior edge of the superior orbital rim. The lateral injection was again in the upper one third of the forehead vertically above the lateral limbus of the cornea, 1.5 cm lateral to the medial injection site.  -Temporalis muscle injection, 5 sites, bilaterally. The first injection was 3 cm above the tragus of the ear, second injection site was 1.5 cm to 3 cm up from the first injection site in line with the tragus of the ear. The third injection site was 1.5-3 cm forward between the first 2 injection sites. The fourth injection site was 1.5 cm posterior to the second injection site. 5th site laterally in the temporalis  muscleat the level of the outer canthus.  - Patient feels her clenching is a trigger for headaches. +5 units masseter bilaterally   - Patient feels the migraines are centered around the eyes +5 units bilaterally at the outer canthus in the orbicularis occuli  -Occipitalis muscle injection, 3 sites, bilaterally. The first injection was done one half way between the occipital protuberance and the tip of the mastoid process behind the ear. The second injection site was done lateral and superior to the first, 1 fingerbreadth from the first injection. The third injection site was 1 fingerbreadth superiorly and medially from the first injection site.  -Cervical paraspinal muscle injection, 2 sites, bilateral knee first injection site was 1 cm  from the midline of the cervical spine, 3 cm inferior to the lower border of the occipital protuberance. The second injection site was 1.5 cm superiorly and laterally to the first injection site.  -Trapezius muscle injection was performed at 3 sites, bilaterally. The first injection site was in the upper trapezius muscle halfway between the inflection point of the neck, and the acromion. The second injection site was one half way between the acromion and the first injection site. The third injection was done between the first injection site and the inflection point of the neck.   Will return for repeat injection in 3 months.   A 200 unit sof Botox was used, any Botox not injected was wasted. The patient tolerated the procedure well, there were no complications of the above procedure.

## 2019-09-06 NOTE — Progress Notes (Signed)
Botox- 100 units x 2 vials Lot: [id number] Expiration: 04/2022 NDC: [id number]  Bacteriostatic 0.9% Sodium Chloride- 23mL total Lot: [id number] Expiration: 05/09/2020 NDC: [id number]  Dx: [id number] B/B

## 2019-09-07 ENCOUNTER — Telehealth: Payer: Self-pay | Admitting: *Deleted

## 2019-09-07 ENCOUNTER — Telehealth: Payer: Self-pay | Admitting: Neurology

## 2019-09-07 NOTE — Telephone Encounter (Signed)
Completed Zembrace PA on CMM. Key: [id number]. Awaiting [employer] determination. I requested urgent determination.

## 2019-09-07 NOTE — Telephone Encounter (Signed)
Completed a PA for Nurtec through cover my meds/[employer]. [id number] Will wait for a response.

## 2019-09-11 ENCOUNTER — Encounter: Payer: Self-pay | Admitting: *Deleted

## 2019-09-11 NOTE — Telephone Encounter (Signed)
Zembrace PA was denied by [employer]. Preferred drug is generic sumatriptan injections. If we should choose to appeal the decision, fax to [phone]. PA # [id number]. Pt will be able to use a savings card.    Sent pt a Therapist, music.

## 2019-09-11 NOTE — Telephone Encounter (Signed)
Per CMM: The request has been approved. The authorization is effective for a maximum of 6 fills from 09/06/2019 to 03/05/2020, as long as the member is enrolled in their current health plan. The request was approved as submitted. This request is approved for 16 tablets per 30 days. Renewal requires that the patient has experienced an improvement from baseline in a validated acute treatment patient-reported outcome questionnaire (e.g., Migraine Assessment of Current Therapy [Migraine-ACT]), OR the patient has experienced clinical improvement as defined by one of the following: Ability to function normally within 2 hours of dose, headache pain disappears within 2 hours of dose, or therapy works consistently in majority of migraine attacks. A written notification letter will follow with additional details.

## 2019-09-21 MED FILL — NURTEC 75 MG TBDP: 75 | 30 days supply | Qty: 8 | Fill #0

## 2019-10-09 ENCOUNTER — Other Ambulatory Visit: Payer: Self-pay | Admitting: Family

## 2019-10-09 MED FILL — BUPROPION HCL ER (SR) 150 M: 150 | 30 days supply | Qty: 60 | Fill #1

## 2019-10-10 MED FILL — AMLODIPINE BESYLATE 5 MG TA: 5 | 90 days supply | Qty: 90 | Fill #0

## 2019-10-10 MED FILL — LEVOCETIRIZINE 5 MG TABLET: 5 | 90 days supply | Qty: 90 | Fill #0

## 2019-10-10 MED FILL — METOPROLOL SUCCINATE ER 25: 25 | 90 days supply | Qty: 90 | Fill #0

## 2019-10-23 ENCOUNTER — Telehealth: Payer: No Typology Code available for payment source | Admitting: Physician Assistant

## 2019-10-23 DIAGNOSIS — M546 Pain in thoracic spine: Secondary | ICD-10-CM

## 2019-10-23 MED ORDER — NAPROXEN 500 MG PO TABS: 500.0000 mg | ORAL_TABLET | Freq: Two times a day (BID) | ORAL | 0 refills | Status: DC

## 2019-10-23 MED ORDER — CYCLOBENZAPRINE HCL 10 MG PO TABS: 10.0000 mg | ORAL_TABLET | Freq: Three times a day (TID) | ORAL | 0 refills | Status: DC | PRN

## 2019-10-23 MED FILL — CYCLOBENZAPRINE HCL 10 MG T: 10 | 10 days supply | Qty: 30 | Fill #0

## 2019-10-23 MED FILL — NAPROXEN 500 MG TABS: 500 | 15 days supply | Qty: 30 | Fill #0

## 2019-10-23 NOTE — Progress Notes (Addendum)
We are sorry that you are not feeling well.  Here is how we plan to help!  Based on what you have shared with me it looks like you mostly have acute back pain.  Acute back pain is defined as musculoskeletal pain that can resolve in 1-3 weeks with conservative treatment.  Working from home presents many challenges, one of which is good posture and regular exercise.  Try to mitigate your pain by improving your work station as well as considering your mattress quality, as well as your sleeping position.  Back stretches every morning and evening will help.  Also work on improving your core strength, as this improves posture.   I have prescribed Naprosyn 500 mg take one by mouth twice a day non-steroid anti-inflammatory (NSAID) as well as Flexeril 10 mg every eight hours as needed which is a muscle relaxer  Some patients experience stomach irritation or in increased heartburn with anti-inflammatory drugs.  Please keep in mind that muscle relaxer's can cause fatigue and should not be taken while at work or driving.  Back pain is very common.  The pain often gets better over time.  The cause of back pain is usually not dangerous.  Most people can learn to manage their back pain on their own.  Home Care  Stay active.  Start with short walks on flat ground if you can.  Try to walk farther each day.  Do not sit, drive or stand in one place for more than 30 minutes.  Do not stay in bed.  Do not avoid exercise or work.  Activity can help your back heal faster.  Be careful when you bend or lift an object.  Bend at your knees, keep the object close to you, and do not twist.  Sleep on a firm mattress.  Lie on your side, and bend your knees.  If you lie on your back, put a pillow under your knees.  Only take medicines as told by your doctor.  Put ice on the injured area.  Put ice in a plastic bag  Place a towel between your skin and the bag  Leave the ice on for 15-20 minutes, 3-4 times a day for  the first 2-3 days. 210 After that, you can switch between ice and heat packs.  Ask your doctor about back exercises or massage.  Avoid feeling anxious or stressed.  Find good ways to deal with stress, such as exercise.  Get Help Right Way If:  Your pain does not go away with rest or medicine.  Your pain does not go away in 1 week.  You have new problems.  You do not feel well.  The pain spreads into your legs.  You cannot control when you poop (bowel movement) or pee (urinate)  You feel sick to your stomach (nauseous) or throw up (vomit)  You have belly (abdominal) pain.  You feel like you may pass out (faint).  If you develop a fever.  Make Sure you:  Understand these instructions.  Will watch your condition  Will get help right away if you are not doing well or get worse.  Your e-visit answers were reviewed by a board certified advanced clinical practitioner to complete your personal care plan.  Depending on the condition, your plan could have included both over the counter or prescription medications.  If there is a problem please reply  once you have received a response from your provider.  Your safety is important to [place].  If you have drug allergies check your prescription carefully.    You can use MyChart to ask questions about today's visit, request a non-urgent call back, or ask for a work or school excuse for 24 hours related to this e-Visit. If it has been greater than 24 hours you will need to follow up with your provider, or enter a new e-Visit to address those concerns.  You will get an e-mail in the next two days asking about your experience.  I hope that your e-visit has been valuable and will speed your recovery. Thank you for using e-visits.  Greater than 5 minutes, yet less than 10 minutes of time have been spent researching, coordinating and implementing care for this patient today.   Thank you for coming in today. I hope you feel we met your needs.   Feel free to call PCP if you have any questions or further requests.  Please consider signing up for MyChart if you do not already have it, as this is a great way to communicate with me.  Best,  [employer], PA-C

## 2019-11-20 ENCOUNTER — Other Ambulatory Visit: Payer: No Typology Code available for payment source

## 2019-12-07 ENCOUNTER — Other Ambulatory Visit: Payer: Self-pay

## 2019-12-07 ENCOUNTER — Ambulatory Visit (INDEPENDENT_AMBULATORY_CARE_PROVIDER_SITE_OTHER): Payer: No Typology Code available for payment source | Admitting: Neurology

## 2019-12-07 VITALS — Temp 97.5°F

## 2019-12-07 DIAGNOSIS — G43711 Chronic migraine without aura, intractable, with status migrainosus: Secondary | ICD-10-CM

## 2019-12-07 NOTE — Progress Notes (Signed)
Consent Form Botulism Toxin Injection For Chronic Migraine  Interval history 10/07/2019: Baseline daily headaches and16 migraine days. Since last botox she has had 2-3 migraines a month and acute management works, nurtec worked but nit better [employer] not a huge difference, takes with zofran and works once. More than 25 free headache days a month, exceptional improvement!+all.  She has the imitrex injection and has nt had to use it.   No orders of the defined types were placed in this encounter.  No orders of the defined types were placed in this encounter.   Reviewed orally with patient, additionally signature is on file:  Botulism toxin has been approved by the Federal drug administration for treatment of chronic migraine. Botulism toxin does not cure chronic migraine and it may not be effective in some patients.  The administration of botulism toxin is accomplished by injecting a small amount of toxin into the muscles of the neck and head. Dosage must be titrated for each individual. Any benefits resulting from botulism toxin tend to wear off after 3 months with a repeat injection required if benefit is to be maintained. Injections are usually done every 3-4 months with maximum effect peak achieved by about 2 or 3 weeks. Botulism toxin is expensive and you should be sure of what costs you will incur resulting from the injection.  The side effects of botulism toxin use for chronic migraine may include:   -Transient, and usually mild, facial weakness with facial injections  -Transient, and usually mild, head or neck weakness with head/neck injections  -Reduction or loss of forehead facial animation due to forehead muscle weakness  -Eyelid drooping  -Dry eye  -Pain at the site of injection or bruising at the site of injection  -Double vision  -Potential unknown long term risks  Contraindications: You should not have Botox if you are pregnant, nursing, allergic to albumin, have an  infection, skin condition, or muscle weakness at the site of the injection, or have myasthenia gravis, Lambert-Eaton syndrome, or ALS.  It is also possible that as with any injection, there may be an allergic reaction or no effect from the medication. Reduced effectiveness after repeated injections is sometimes seen and rarely infection at the injection site may occur. All care will be taken to prevent these side effects. If therapy is given over a long time, atrophy and wasting in the muscle injected may occur. Occasionally the patient's become refractory to treatment because they develop antibodies to the toxin. In this event, therapy needs to be modified.  I have read the above information and consent to the administration of botulism toxin.    BOTOX PROCEDURE NOTE FOR MIGRAINE HEADACHE    Contraindications and precautions discussed with patient(above). Aseptic procedure was observed and patient tolerated procedure. Procedure performed by Dr. Georgia Dom  The condition has existed for more than 6 months, and pt does not have a diagnosis of ALS, Myasthenia Gravis or Lambert-Eaton Syndrome.  Risks and benefits of injections discussed and pt agrees to proceed with the procedure.  Written consent obtained  These injections are medically necessary. Pt  receives good benefits from these injections. These injections do not cause sedations or hallucinations which the oral therapies may cause.  Description of procedure:  The patient was placed in a sitting position. The standard protocol was used for Botox as follows, with 5 units of Botox injected at each site:   -Procerus muscle, midline injection  -Corrugator muscle, bilateral injection  -Frontalis muscle, bilateral  injection, with 2 sites each side, medial injection was performed in the upper one third of the frontalis muscle, in the region vertical from the medial inferior edge of the superior orbital rim. The lateral injection was again in  the upper one third of the forehead vertically above the lateral limbus of the cornea, 1.5 cm lateral to the medial injection site.  -Temporalis muscle injection, 5 sites, bilaterally. The first injection was 3 cm above the tragus of the ear, second injection site was 1.5 cm to 3 cm up from the first injection site in line with the tragus of the ear. The third injection site was 1.5-3 cm forward between the first 2 injection sites. The fourth injection site was 1.5 cm posterior to the second injection site. 5th site laterally in the temporalis  muscleat the level of the outer canthus.  - Patient feels her clenching is a trigger for headaches. +5 units masseter bilaterally   - Patient feels the migraines are centered around the eyes +5 units bilaterally at the outer canthus in the orbicularis occuli  -Occipitalis muscle injection, 3 sites, bilaterally. The first injection was done one half way between the occipital protuberance and the tip of the mastoid process behind the ear. The second injection site was done lateral and superior to the first, 1 fingerbreadth from the first injection. The third injection site was 1 fingerbreadth superiorly and medially from the first injection site.  -Cervical paraspinal muscle injection, 2 sites, bilateral knee first injection site was 1 cm from the midline of the cervical spine, 3 cm inferior to the lower border of the occipital protuberance. The second injection site was 1.5 cm superiorly and laterally to the first injection site.  -Trapezius muscle injection was performed at 3 sites, bilaterally. The first injection site was in the upper trapezius muscle halfway between the inflection point of the neck, and the acromion. The second injection site was one half way between the acromion and the first injection site. The third injection was done between the first injection site and the inflection point of the neck.   Will return for repeat injection in 3 months.    A 200 unit sof Botox was used, any Botox not injected was wasted. The patient tolerated the procedure well, there were no complications of the above procedure.

## 2019-12-07 NOTE — Progress Notes (Signed)
Botox- 200 units x 1 vial Lot: [id number] Expiration: 08/2022 NDC: 0023-3921-02  Bacteriostatic 0.9% Sodium Chloride- 4mL total Lot: [id number] Expiration: 02/08/2020 NDC: 0409-1966-02  Dx: [id number] B/B   

## 2019-12-27 ENCOUNTER — Other Ambulatory Visit: Payer: Self-pay | Admitting: Family

## 2019-12-27 MED FILL — ZEMBRACE SYMTOUCH 3 MG/0.5M: 3 | 30 days supply | Qty: 4 | Fill #0

## 2019-12-27 MED FILL — BUPROPION HCL ER (SR) 150 M: 150 | 30 days supply | Qty: 60 | Fill #2

## 2019-12-28 NOTE — Telephone Encounter (Signed)
We received another Zembrace PA. I completed this on CMM. Awaiting [employer] determination. Key: [id number]. Even if this is denied, patient should be able to continue using the savings card.

## 2020-01-02 ENCOUNTER — Encounter: Payer: Self-pay | Admitting: *Deleted

## 2020-01-02 NOTE — Telephone Encounter (Signed)
Per [employer], Zembrace denied. Covered alternatives include: sumatriptan syringe & pen.   Pt should be able to still use the savings card. If we should choose to appeal, fax within 180 days to [phone]. PA reference number: [id number].   Sent mychart message to pt.

## 2020-01-03 MED FILL — NURTEC 75 MG TBDP: 75 | 30 days supply | Qty: 8 | Fill #0

## 2020-01-03 MED FILL — RIZATRIPTAN 10 MG ODT: 10 | 30 days supply | Qty: 9 | Fill #2

## 2020-01-08 MED FILL — METOPROLOL SUCCINATE ER 25: 25 | 90 days supply | Qty: 90 | Fill #0

## 2020-01-08 MED FILL — LEVOCETIRIZINE 5 MG TABLET: 5 | 90 days supply | Qty: 90 | Fill #0

## 2020-01-08 MED FILL — AMLODIPINE BESYLATE 5 MG TA: 5 | 90 days supply | Qty: 90 | Fill #0

## 2020-02-01 ENCOUNTER — Encounter: Payer: Self-pay | Admitting: Family

## 2020-02-01 DIAGNOSIS — L989 Disorder of the skin and subcutaneous tissue, unspecified: Secondary | ICD-10-CM

## 2020-02-02 MED FILL — NURTEC 75 MG TBDP: 75 | 30 days supply | Qty: 8 | Fill #1

## 2020-02-02 MED FILL — BUPROPION HCL ER (SR) 150 M: 150 | 30 days supply | Qty: 60 | Fill #3

## 2020-02-02 MED FILL — RIZATRIPTAN 10 MG ODT: 10 | 30 days supply | Qty: 9 | Fill #3

## 2020-02-19 ENCOUNTER — Telehealth: Payer: Self-pay | Admitting: *Deleted

## 2020-02-19 NOTE — Telephone Encounter (Signed)
I called [facility] [phone] and spoke to Judson Roch.   She states [phone] and [phone] do need PA.  There is a PA already on file.  [phone] Valid from 09/05/2020/09/05/2021 for 4 visits States that patient will not be eligible for her next appt until 03/06/2020.  Ref# for this call is [id number].

## 2020-02-20 ENCOUNTER — Ambulatory Visit: Payer: No Typology Code available for payment source | Admitting: Family

## 2020-02-23 ENCOUNTER — Other Ambulatory Visit: Payer: Self-pay

## 2020-02-27 ENCOUNTER — Encounter: Payer: Self-pay | Admitting: Family

## 2020-02-27 ENCOUNTER — Other Ambulatory Visit: Payer: Self-pay

## 2020-02-27 ENCOUNTER — Other Ambulatory Visit: Payer: Self-pay | Admitting: Family

## 2020-02-27 ENCOUNTER — Ambulatory Visit (INDEPENDENT_AMBULATORY_CARE_PROVIDER_SITE_OTHER): Payer: No Typology Code available for payment source | Admitting: Family

## 2020-02-27 VITALS — BP 136/88 | HR 79 | Temp 97.7°F | Resp 16 | Ht 63.5 in | Wt 210.0 lb

## 2020-02-27 DIAGNOSIS — M545 Low back pain, unspecified: Secondary | ICD-10-CM

## 2020-02-27 DIAGNOSIS — I10 Essential (primary) hypertension: Secondary | ICD-10-CM

## 2020-02-27 DIAGNOSIS — Z6836 Body mass index (BMI) 36.0-36.9, adult: Secondary | ICD-10-CM

## 2020-02-27 DIAGNOSIS — R41840 Attention and concentration deficit: Secondary | ICD-10-CM

## 2020-02-27 DIAGNOSIS — F419 Anxiety disorder, unspecified: Secondary | ICD-10-CM | POA: Diagnosis not present

## 2020-02-27 DIAGNOSIS — F329 Major depressive disorder, single episode, unspecified: Secondary | ICD-10-CM

## 2020-02-27 DIAGNOSIS — F32A Depression, unspecified: Secondary | ICD-10-CM

## 2020-02-27 DIAGNOSIS — G8929 Other chronic pain: Secondary | ICD-10-CM

## 2020-02-27 NOTE — Patient Instructions (Signed)

## 2020-02-27 NOTE — Progress Notes (Signed)
Subjective:    Patient ID: Kaitlin Foster, female    DOB: [birth year], 39 y.o.   MRN: [id number]  HPI  Patient is a 39 yr old female who presents today for follow up.  HTN- She is maintained on metoprolol and amlodipine.  BP Readings from Last 3 Encounters:  02/27/20 136/88  08/22/19 123/79  06/14/19 135/89   Anxiety/Depression- previously prescribed wellbutrin by her neurologist. Reports that she has not really taking it. Feels like mood/anxiety is stable.   Has had weight gain.  Not exercising.  She has been going to [employer].   Wt Readings from Last 3 Encounters:  02/27/20 210 lb (95.3 kg)  08/22/19 202 lb (91.6 kg)  06/14/19 205 lb (93 kg)    Back pain- reports + low back pain, has been present for 6 months. Denies known injury. Notes that she has been less active.     Review of Systems See HPI  Past Medical History:  Diagnosis Date  . Anxiety   . Chronic migraine    neurologist-  dr Jaynee Eagles--- botox injecitons every 3 months  . GERD (gastroesophageal reflux disease)   . 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 Hypertension   . Left ureteral stone   . Migraines   . Nephrolithiasis    left renal nonobstructive stone per CT 06-14-2019  . Uterine fibroid      Social History   Socioeconomic History  . Marital status: Married    Spouse name: Thad  . Number of children: 1  . Years of education: Not on file  . Highest education level: Associate degree: academic program  Occupational History  . Occupation: Therapist, sports  Tobacco Use  . Smoking status: Never Smoker  . Smokeless tobacco: Never Used  Substance and Sexual Activity  . Alcohol use: No   Alcohol/week: 0.0 standard drinks  . Drug use: No  . Sexual activity: Yes    Birth control/protection: Condom  Other Topics Concern  . 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 Difficulty of Paying Living Expenses:   Food Insecurity:   . Worried About Charity fundraiser in the Last Year:   . Arboriculturist in the Last Year:   Transportation Needs:   . Film/video editor (Medical):   [name] Lack of Transportation (Non-Medical):   Physical Activity:   . Days of Exercise per Week:   . Minutes of Exercise per Session:   Stress:   . Feeling of Stress :   Social Connections:   . Frequency of Communication with Friends and Family:   . Frequency of Social Gatherings with Friends and Family:   . Attends Religious Services:   . Active Member of Clubs or Organizations:   . Attends Archivist Meetings:   [name] Marital Status:  Intimate Partner Violence:   . Fear of Current or Ex-Partner:   . Emotionally Abused:   [name] Physically Abused:   . Sexually Abused:     Past Surgical History:  Procedure Laterality Date  . CEREBRAL EMBOLIZATION  Sept 11th and 18th, 2008   @[facility]    2 staged embolization left ACA supply to AVM  . CESAREAN SECTION  07/06/2009  . CRANIOTOMY  07-29-2007   @ [facility]   resection left frontal AVM    Family History  Problem Relation Age of Onset  . Hypertension Father   . Alcohol abuse Father   . Arthritis Father   . Obesity Father   . Hypertension Mother   . Arthritis Mother   . Migraines Mother   . Obesity Mother   . Cancer Mother        sinus tumor  . Migraines Sister   . Ulcerative colitis Sister   . Migraines Maternal Aunt   . ADD / ADHD Son   . Cancer Maternal Grandmother        colon  diagnosed at age 36  . Dementia Maternal Grandmother   . Heart disease Neg Hx     Allergies  Allergen Reactions  . Codeine Other (See Comments)    REACTION: upset stomach once, but has taken it other times and been ok  . Lisinopril Other (See Comments)    cough  . Sulfa Antibiotics Hives    Current Outpatient Medications on File Prior to Visit  Medication Sig Dispense Refill  . amLODipine (NORVASC) 5 MG tablet TAKE 1 TABLET (5 MG TOTAL) BY MOUTH DAILY. 90 tablet 0  . Aspirin-Acetaminophen-Caffeine (EXCEDRIN MIGRAINE PO) Take 2 tablets by mouth 2 (two) times daily as needed.    [name] buPROPion (WELLBUTRIN SR) 150 MG 12 hr tablet TAKE 1 TABLET BY MOUTH TWICE A DAY 60 tablet 6  . calcium carbonate (TUMS - DOSED IN MG ELEMENTAL CALCIUM) 500 MG chewable tablet Chew 1 tablet by mouth as needed for indigestion or heartburn.    . cyclobenzaprine (FLEXERIL) 10 MG tablet Take 1 tablet (10 mg total) by mouth 3 (three) times daily as needed for muscle spasms. 30 tablet 0  . fluticasone (FLONASE) 50 MCG/ACT nasal spray Place into both nostrils as needed for allergies or rhinitis.    [name] ibuprofen (ADVIL) 200 MG tablet Take 200 mg by mouth every 6 (six) hours as needed.    [name] levocetirizine (XYZAL) 5 MG tablet TAKE 1 TABLET BY MOUTH EVERY EVENING. 90 tablet 0  . metoprolol succinate (TOPROL-XL) 25 MG 24 hr tablet TAKE 1 TABLET (25 MG TOTAL) BY MOUTH DAILY. 90 tablet 0  . naproxen (NAPROSYN) 500 MG tablet Take 1 tablet (500 mg total) by mouth 2 (two) times daily with a meal. 30 tablet 0  . ondansetron (ZOFRAN ODT) 4 MG disintegrating tablet Take 1 tablet (4 mg total) by mouth every 8 (eight) hours as needed for nausea or vomiting. 20 tablet 0  . Rimegepant Sulfate (NURTEC) 75 MG TBDP Take 75 mg by mouth daily as needed. For migraines. Take as close to onset of migraine as possible. One daily maximum. 10 tablet 6  . rizatriptan (MAXALT-MLT) 10 MG disintegrating tablet Take 1 tablet (10 mg total) by mouth as  needed for migraine. May repeat in 2 hours if needed 9 tablet 11  . SUMAtriptan Succinate (ZEMBRACE SYMTOUCH) 3 MG/0.5ML SOAJ Inject 3 mg into the skin as needed for up to 1 dose. May repeat in 15 mins.  If needed,repeat in 2 hours. Max 4 injections daily. 8 pen 11   No current facility-administered medications on file prior to visit.    BP 136/88 (BP Location: Right Arm, Patient Position: Sitting, Cuff Size: Large)   Pulse 79   Temp 97.7 F (36.5 C) (Temporal)   Resp 16   Ht 5' 3.5" (1.613 m)   Wt 210 lb (95.3 kg)   SpO2 100%   BMI 36.62 kg/m       Objective:   Physical Exam Constitutional:      Appearance: She is well-developed.  Neck:     Thyroid: No thyromegaly.  Cardiovascular:     Rate and Rhythm: Normal rate and regular rhythm.     Heart sounds: Normal heart sounds. No murmur.  Pulmonary:     Effort: Pulmonary effort is normal. No respiratory distress.     Breath sounds: Normal breath sounds. No wheezing.  Musculoskeletal:     Cervical back: Neck supple.     Thoracic back: No tenderness.     Lumbar back: No tenderness.  Skin:    General: Skin is warm and dry.  Neurological:     Mental Status: She is alert and oriented to person, place, and time.     Deep Tendon Reflexes:     Reflex Scores:      Patellar reflexes are 2+ on the right side and 2+ on the left side.    Comments: Bilateral LE strength is 5/5  Psychiatric:        Behavior: Behavior normal.        Thought Content: Thought content normal.        Judgment: Judgment normal.           Assessment & Plan:  Attention problem- will refer to psychology for a formal ADHD evaluation.  HTN- bp is acceptable on current regimen.Continue current meds.  Anxiety/Depression- overall stable without medication.  Obesity- encouraged her to continue to work on weight loss and exercise.  Low back pain- pt given some exercises to do at home.    This visit occurred during the SARS-CoV-2 public health emergency.   Safety protocols were in place, including screening questions prior to the visit, additional usage of staff PPE, and extensive cleaning of exam room while observing appropriate contact time as indicated for disinfecting solutions.

## 2020-02-28 ENCOUNTER — Encounter: Payer: Self-pay | Admitting: Family

## 2020-02-29 ENCOUNTER — Ambulatory Visit: Payer: No Typology Code available for payment source | Admitting: Neurology

## 2020-03-06 NOTE — Progress Notes (Signed)
Consent Form Botulism Toxin Injection For Chronic Migraine  Interval history 10/07/2019: Baseline daily headaches and16 migraine days. Since last botox she has had 2-3 migraines a month and acute management works, nurtec worked but nit better [employer] not a huge difference, takes with zofran and works once. More than 25 free headache days a month, exceptional improvement!+all.  She has the imitrex injection and has nt had to use it.   Orders: [facility] or [facility] for dry needling for cervical and lumbar/lumbosacral dry needling for myofascial disease  No orders of the defined types were placed in this encounter.  No orders of the defined types were placed in this encounter.   Reviewed orally with patient, additionally signature is on file:  Botulism toxin has been approved by the Federal drug administration for treatment of chronic migraine. Botulism toxin does not cure chronic migraine and it may not be effective in some patients.  The administration of botulism toxin is accomplished by injecting a small amount of toxin into the muscles of the neck and head. Dosage must be titrated for each individual. Any benefits resulting from botulism toxin tend to wear off after 3 months with a repeat injection required if benefit is to be maintained. Injections are usually done every 3-4 months with maximum effect peak achieved by about 2 or 3 weeks. Botulism toxin is expensive and you should be sure of what costs you will incur resulting from the injection.  The side effects of botulism toxin use for chronic migraine may include:   -Transient, and usually mild, facial weakness with facial injections  -Transient, and usually mild, head or neck weakness with head/neck injections  -Reduction or loss of forehead facial animation due to forehead muscle weakness  -Eyelid drooping  -Dry eye  -Pain at the site of injection or bruising at the site of injection  -Double  vision  -Potential unknown long term risks  Contraindications: You should not have Botox if you are pregnant, nursing, allergic to albumin, have an infection, skin condition, or muscle weakness at the site of the injection, or have myasthenia gravis, Lambert-Eaton syndrome, or ALS.  It is also possible that as with any injection, there may be an allergic reaction or no effect from the medication. Reduced effectiveness after repeated injections is sometimes seen and rarely infection at the injection site may occur. All care will be taken to prevent these side effects. If therapy is given over a long time, atrophy and wasting in the muscle injected may occur. Occasionally the patient's become refractory to treatment because they develop antibodies to the toxin. In this event, therapy needs to be modified.  I have read the above information and consent to the administration of botulism toxin.    BOTOX PROCEDURE NOTE FOR MIGRAINE HEADACHE    Contraindications and precautions discussed with patient(above). Aseptic procedure was observed and patient tolerated procedure. Procedure performed by Dr. Georgia Dom  The condition has existed for more than 6 months, and pt does not have a diagnosis of ALS, Myasthenia Gravis or Lambert-Eaton Syndrome.  Risks and benefits of injections discussed and pt agrees to proceed with the procedure.  Written consent obtained  These injections are medically necessary. Pt  receives good benefits from these injections. These injections do not cause sedations or hallucinations which the oral therapies may cause.  Description of procedure:  The patient was placed in a sitting position. The standard protocol was used for Botox as follows, with 5 units of Botox  injected at each site:   -Procerus muscle, midline injection  -Corrugator muscle, bilateral injection  -Frontalis muscle, bilateral injection, with 2 sites each side, medial injection was performed in the upper  one third of the frontalis muscle, in the region vertical from the medial inferior edge of the superior orbital rim. The lateral injection was again in the upper one third of the forehead vertically above the lateral limbus of the cornea, 1.5 cm lateral to the medial injection site.  -Temporalis muscle injection, 5 sites, bilaterally. The first injection was 3 cm above the tragus of the ear, second injection site was 1.5 cm to 3 cm up from the first injection site in line with the tragus of the ear. The third injection site was 1.5-3 cm forward between the first 2 injection sites. The fourth injection site was 1.5 cm posterior to the second injection site. 5th site laterally in the temporalis  muscleat the level of the outer canthus.  - Patient feels her clenching is a trigger for headaches. +5 units masseter bilaterally   - Patient feels the migraines are centered around the eyes +5 units bilaterally at the outer canthus in the orbicularis occuli  -Occipitalis muscle injection, 3 sites, bilaterally. The first injection was done one half way between the occipital protuberance and the tip of the mastoid process behind the ear. The second injection site was done lateral and superior to the first, 1 fingerbreadth from the first injection. The third injection site was 1 fingerbreadth superiorly and medially from the first injection site.  -Cervical paraspinal muscle injection, 2 sites, bilateral knee first injection site was 1 cm from the midline of the cervical spine, 3 cm inferior to the lower border of the occipital protuberance. The second injection site was 1.5 cm superiorly and laterally to the first injection site.  -Trapezius muscle injection was performed at 3 sites, bilaterally. The first injection site was in the upper trapezius muscle halfway between the inflection point of the neck, and the acromion. The second injection site was one half way between the acromion and the first injection site.  The third injection was done between the first injection site and the inflection point of the neck.   Will return for repeat injection in 3 months.   A 200 unit sof Botox was used, any Botox not injected was wasted. The patient tolerated the procedure well, there were no complications of the above procedure.

## 2020-03-07 ENCOUNTER — Ambulatory Visit (INDEPENDENT_AMBULATORY_CARE_PROVIDER_SITE_OTHER): Payer: No Typology Code available for payment source | Admitting: Neurology

## 2020-03-07 ENCOUNTER — Other Ambulatory Visit: Payer: Self-pay

## 2020-03-07 DIAGNOSIS — G43711 Chronic migraine without aura, intractable, with status migrainosus: Secondary | ICD-10-CM

## 2020-03-07 NOTE — Progress Notes (Signed)
Botox- 200 units x 1 vial Lot: [id number] Expiration: 10/2022 NDC: [id number]  Bacteriostatic 0.9% Sodium Chloride- 22mL total Lot: [id number] Expiration: 05/09/2020 NDC: [id number]  Dx: [id number] B/B

## 2020-03-13 ENCOUNTER — Encounter: Payer: Self-pay | Admitting: Physician Assistant

## 2020-03-13 ENCOUNTER — Ambulatory Visit (INDEPENDENT_AMBULATORY_CARE_PROVIDER_SITE_OTHER): Payer: No Typology Code available for payment source | Admitting: Physician Assistant

## 2020-03-13 ENCOUNTER — Other Ambulatory Visit: Payer: Self-pay

## 2020-03-13 DIAGNOSIS — D485 Neoplasm of uncertain behavior of skin: Secondary | ICD-10-CM

## 2020-03-13 DIAGNOSIS — Z1283 Encounter for screening for malignant neoplasm of skin: Secondary | ICD-10-CM | POA: Diagnosis not present

## 2020-03-13 DIAGNOSIS — L309 Dermatitis, unspecified: Secondary | ICD-10-CM

## 2020-03-13 LAB — POCT SKIN KOH

## 2020-03-13 NOTE — Patient Instructions (Signed)

## 2020-03-13 NOTE — Progress Notes (Signed)
   New Patient Visit  Subjective  Kaitlin Foster is a 39 y.o. female who presents for the following: Annual Exam (right leg-dark spots). Spot on right leg and left hip x years. Seem dark and have started to get crusty. Spot on back that has been there a few weeks. It itches and is raised. Skin feels different ? Something fungal.   Objective  Well appearing patient in no apparent distress; mood and affect are within normal limits.  A full examination was performed including head, eyes, ears, nose, lips, neck, chest, axillae, abdomen, back, buttocks, bilateral upper extremities, bilateral lower extremities, hands, feet, fingers, toes, fingernails, and toenails. All findings within normal limits unless otherwise noted below. No suspicious moles noted on back.  Objective  Mid Back: Small nummular erythematous scaling patch lower mid back. KOH negative  Objective  Left Parietal Scalp: Large thick raised irritated lesion     Objective  Posterior Mid Neck: Inflamed brown papule     Assessment & Plan  Dermatitis Mid Back  Sample of [place] to apply bid. If it doesn't clear with that she can try hydrocortisone cream.  Other Related Procedures POCT Skin KOH  Neoplasm of uncertain behavior of skin (2) Left Parietal Scalp  Skin / nail biopsy Type of biopsy: tangential   Informed consent: discussed and consent obtained   Procedure prep:  Patient was prepped and draped in usual sterile fashion (Non sterile) Prep type:  Chlorhexidine Anesthesia: the lesion was anesthetized in a standard fashion   Anesthetic:  1% lidocaine w/ epinephrine 1-100,000 local infiltration Instrument used: flexible razor blade    Specimen 1 - Surgical pathology Differential Diagnosis: R/0 CN Check Margins: No  Posterior Mid Neck  Skin / nail biopsy Type of biopsy: tangential   Informed consent: discussed and consent obtained   Procedure prep:  Patient was prepped and draped in usual sterile fashion  (Non sterile) Prep type:  Chlorhexidine Anesthesia: the lesion was anesthetized in a standard fashion   Anesthetic:  1% lidocaine w/ epinephrine 1-100,000 local infiltration Instrument used: flexible razor blade    Specimen 2 - Surgical pathology Differential Diagnosis: R/O CN Check Margins: No  Total Body Skin Cancer Screening

## 2020-04-10 MED FILL — ONDANSETRON ODT 4MG TBDP: 4 | 7 days supply | Qty: 20 | Fill #0

## 2020-04-11 NOTE — Telephone Encounter (Signed)
The request has been approved. The authorization is effective for a maximum of 12 fills from 04/11/2020 to 04/10/2021, as long as the member is enrolled in their current health plan. This has been approved for a quantity limit of 16.0 with a day supply limit of 30.0. A written notification letter will follow with additional details.</STRONG> </P>

## 2020-04-11 NOTE — Telephone Encounter (Signed)
Completed renewal PA for Nurtec. Key: [place]. Awaiting determination from [facility]. Pt states Nurtec working for her per [employer].

## 2020-05-08 ENCOUNTER — Other Ambulatory Visit: Payer: Self-pay | Admitting: Family

## 2020-05-08 MED FILL — METOPROLOL SUCCINATE ER 25: 25 | 90 days supply | Qty: 90 | Fill #0

## 2020-05-08 MED FILL — AMLODIPINE BESYLATE 5 MG TA: 5 | 90 days supply | Qty: 90 | Fill #0

## 2020-05-08 MED FILL — LEVOCETIRIZINE 5 MG TABLET: 5 | 90 days supply | Qty: 90 | Fill #0

## 2020-06-07 ENCOUNTER — Encounter: Payer: Self-pay | Admitting: Family

## 2020-06-11 ENCOUNTER — Encounter: Payer: Self-pay | Admitting: Internal Medicine

## 2020-06-11 ENCOUNTER — Other Ambulatory Visit: Payer: Self-pay

## 2020-06-11 ENCOUNTER — Ambulatory Visit (INDEPENDENT_AMBULATORY_CARE_PROVIDER_SITE_OTHER): Payer: No Typology Code available for payment source | Admitting: Internal Medicine

## 2020-06-11 VITALS — BP 120/87 | HR 90 | Temp 98.4°F | Resp 16 | Ht 63.5 in | Wt 213.2 lb

## 2020-06-11 DIAGNOSIS — R002 Palpitations: Secondary | ICD-10-CM

## 2020-06-11 NOTE — Patient Instructions (Signed)
  GO TO THE LAB : Get the blood work     

## 2020-06-11 NOTE — Progress Notes (Signed)
Pre visit review using our clinic review tool, if applicable. No additional management support is needed unless otherwise documented below in the visit note. 

## 2020-06-11 NOTE — Progress Notes (Signed)
Subjective:    Patient ID: Kaitlin Foster, female    DOB: 1980/11/27, 39 y.o.   MRN: [id number]  DOS:  06/11/2020 Type of visit - description: Acute For the last 3 to 4 weeks is experiencing palpitation. Is described as a fluttery feeling in the middle of the chest, last few seconds, episodes happening every 2 to 3 days. No associated nausea, vomiting.  No diaphoresis No fainting feeling. No chest pain no difficulty breathing  I ask about stress, stress is at baseline. Headaches: At baseline, no recent severe headaches.   Review of Systems See above   Past Medical History:  Diagnosis Date  . Anxiety   . Chronic migraine    neurologist-  dr Jaynee Eagles--- botox injecitons every 3 months  . GERD (gastroesophageal reflux disease)   . 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 Hypertension   . Left ureteral stone   . Migraines   . Nephrolithiasis    left renal nonobstructive stone per CT 06-14-2019  . Uterine fibroid     Past Surgical History:  Procedure Laterality Date  . CEREBRAL EMBOLIZATION  Sept 11th and 18th, 2008   @[facility]    2 staged embolization left ACA supply to AVM  . CESAREAN SECTION  07/06/2009  . CRANIOTOMY  07-29-2007   @ [facility]   resection left frontal AVM    Allergies as of 06/11/2020      Reactions   Codeine Other (See Comments)   REACTION: upset stomach once, but has taken it other times and been ok   Lisinopril Other (See Comments)   cough   Sulfa Antibiotics Hives      Medication List       Accurate as of June 11, 2020  8:07 PM. If you have any questions, ask your nurse or doctor.        STOP taking these medications   calcium carbonate  500 MG chewable tablet Commonly known as: TUMS - dosed in mg elemental calcium Stopped by: Kathlene November, MD   cyclobenzaprine 10 MG tablet Commonly known as: FLEXERIL Stopped by: Kathlene November, MD   Davis by: Kathlene November, MD   ibuprofen 200 MG tablet Commonly known as: ADVIL Stopped by: Kathlene November, MD   naproxen 500 MG tablet Commonly known as: Naprosyn Stopped by: Kathlene November, MD   Zembrace SymTouch 3 MG/0.5ML Soaj Generic drug: SUMAtriptan Succinate Stopped by: Kathlene November, MD     TAKE these medications   amLODipine 5 MG tablet Commonly known as: NORVASC Take 1 tablet (5 mg total) by mouth daily.   famotidine 20 MG tablet Commonly known as: PEPCID Take 20 mg by mouth daily as needed for heartburn or indigestion.   fluticasone 50 MCG/ACT nasal spray Commonly known as: FLONASE Place into both nostrils as needed for allergies or rhinitis.   levocetirizine 5 MG tablet Commonly known as: XYZAL Take 1 tablet (5 mg total) by mouth every evening.   metoprolol succinate 25 MG 24 hr tablet Commonly known as: TOPROL-XL Take 1 tablet (25 mg total) by mouth daily.   Nurtec 75 MG Tbdp Generic drug: Rimegepant Sulfate Take 75 mg by  mouth daily as needed. For migraines. Take as close to onset of migraine as possible. One daily maximum.   ondansetron 4 MG disintegrating tablet Commonly known as: Zofran ODT Take 1 tablet (4 mg total) by mouth every 8 (eight) hours as needed for nausea or vomiting.   rizatriptan 10 MG disintegrating tablet Commonly known as: MAXALT-MLT Take 1 tablet (10 mg total) by mouth as needed for migraine. May repeat in 2 hours if needed          Objective:   Physical Exam BP 120/87 (BP Location: Right Arm, Patient Position: Sitting, Cuff Size: Normal)   Pulse 90   Temp 98.4 F (36.9 C) (Oral)   Resp 16   Ht 5' 3.5" (1.613 m)   Wt 213 lb 4 oz (96.7 kg)   LMP 05/21/2020 (Approximate)   SpO2 99%   BMI 37.18 kg/m  General:   Well  developed, NAD, BMI noted. HEENT:  Normocephalic . Face symmetric, atraumatic Neck: No thyromegaly Lungs:  CTA B Normal respiratory effort, no intercostal retractions, no accessory muscle use. Heart: RRR,  no murmur.  Lower extremities: no pretibial edema bilaterally  Skin: Not pale. Not jaundice Neurologic:  alert & oriented X3.  Speech normal, gait appropriate for age and unassisted Psych--  Cognition and judgment appear intact.  Cooperative with normal attention span and concentration.  Behavior appropriate. No anxious or depressed appearing.      Assessment     39 year old female, PMH includes HTN, migraines, spontaneous tension pneumothorax, anxiety depression, AV malformation, status post a craniotomy, BC: condoms (LMP 3 weeks ago), presents with  Palpitations: As described above, no red flag symptoms. EKG today: NSR. Cardiology referral? this was discussed but for now we agreed on observation and a limited work-up, will check a CBC and TSH  If symptoms persist will, she will call for a cardiology referral.  This visit occurred during the SARS-CoV-2 public health emergency.  Safety protocols were in place, including screening questions prior to the visit, additional usage of staff PPE, and extensive cleaning of exam room while observing appropriate contact time as indicated for disinfecting solutions.

## 2020-06-12 LAB — CBC WITH DIFFERENTIAL/PLATELET
Basophils Absolute: 0.1 10*3/uL (ref 0.0–0.1)
Basophils Relative: 1 % (ref 0.0–3.0)
Eosinophils Absolute: 0.1 10*3/uL (ref 0.0–0.7)
Eosinophils Relative: 1.9 % (ref 0.0–5.0)
HCT: 34.4 % — ABNORMAL LOW (ref 36.0–46.0)
Hemoglobin: 10.8 g/dL — ABNORMAL LOW (ref 12.0–15.0)
Lymphocytes Relative: 22.9 % (ref 12.0–46.0)
Lymphs Abs: 1.6 10*3/uL (ref 0.7–4.0)
MCHC: 31.2 g/dL (ref 30.0–36.0)
MCV: 71.2 fl — ABNORMAL LOW (ref 78.0–100.0)
Monocytes Absolute: 0.6 10*3/uL (ref 0.1–1.0)
Monocytes Relative: 9.3 % (ref 3.0–12.0)
Neutro Abs: 4.4 10*3/uL (ref 1.4–7.7)
Neutrophils Relative %: 64.9 % (ref 43.0–77.0)
Platelets: 305 10*3/uL (ref 150.0–400.0)
RBC: 4.83 Mil/uL (ref 3.87–5.11)
RDW: 16.6 % — ABNORMAL HIGH (ref 11.5–15.5)
WBC: 6.8 10*3/uL (ref 4.0–10.5)

## 2020-06-12 LAB — TSH: TSH: 1.72 u[IU]/mL (ref 0.35–4.50)

## 2020-06-18 NOTE — Progress Notes (Signed)
Consent Form Botulism Toxin Injection For Chronic Migraine  Interval history 06/19/2020: Baseline daily headaches and16 migraine days. Since last botox she has had only 3 total migraines and acute management works, nurtec works with zofran, she had side effects to zembrace, also Wagner works, takes with zofran. More than 25 free headache days a month, exceptional improvement!+all.    Orders: [facility] or [facility] for dry needling for cervical and lumbar/lumbosacral dry needling for myofascial disease in the past  No orders of the defined types were placed in this encounter.  No orders of the defined types were placed in this encounter.   Reviewed orally with patient, additionally signature is on file:  Botulism toxin has been approved by the Federal drug administration for treatment of chronic migraine. Botulism toxin does not cure chronic migraine and it may not be effective in some patients.  The administration of botulism toxin is accomplished by injecting a small amount of toxin into the muscles of the neck and head. Dosage must be titrated for each individual. Any benefits resulting from botulism toxin tend to wear off after 3 months with a repeat injection required if benefit is to be maintained. Injections are usually done every 3-4 months with maximum effect peak achieved by about 2 or 3 weeks. Botulism toxin is expensive and you should be sure of what costs you will incur resulting from the injection.  The side effects of botulism toxin use for chronic migraine may include:   -Transient, and usually mild, facial weakness with facial injections  -Transient, and usually mild, head or neck weakness with head/neck injections  -Reduction or loss of forehead facial animation due to forehead muscle weakness  -Eyelid drooping  -Dry eye  -Pain at the site of injection or bruising at the site of injection  -Double vision  -Potential unknown long term  risks  Contraindications: You should not have Botox if you are pregnant, nursing, allergic to albumin, have an infection, skin condition, or muscle weakness at the site of the injection, or have myasthenia gravis, Lambert-Eaton syndrome, or ALS.  It is also possible that as with any injection, there may be an allergic reaction or no effect from the medication. Reduced effectiveness after repeated injections is sometimes seen and rarely infection at the injection site may occur. All care will be taken to prevent these side effects. If therapy is given over a long time, atrophy and wasting in the muscle injected may occur. Occasionally the patient's become refractory to treatment because they develop antibodies to the toxin. In this event, therapy needs to be modified.  I have read the above information and consent to the administration of botulism toxin.    BOTOX PROCEDURE NOTE FOR MIGRAINE HEADACHE    Contraindications and precautions discussed with patient(above). Aseptic procedure was observed and patient tolerated procedure. Procedure performed by Dr. Georgia Dom  The condition has existed for more than 6 months, and pt does not have a diagnosis of ALS, Myasthenia Gravis or Lambert-Eaton Syndrome.  Risks and benefits of injections discussed and pt agrees to proceed with the procedure.  Written consent obtained  These injections are medically necessary. Pt  receives good benefits from these injections. These injections do not cause sedations or hallucinations which the oral therapies may cause.  Description of procedure:  The patient was placed in a sitting position. The standard protocol was used for Botox as follows, with 5 units of Botox injected at each site:   -Procerus muscle, midline injection  -  Corrugator muscle, bilateral injection  -Frontalis muscle, bilateral injection, with 2 sites each side, medial injection was performed in the upper one third of the frontalis muscle, in  the region vertical from the medial inferior edge of the superior orbital rim. The lateral injection was again in the upper one third of the forehead vertically above the lateral limbus of the cornea, 1.5 cm lateral to the medial injection site.  -Temporalis muscle injection, 5 sites, bilaterally. The first injection was 3 cm above the tragus of the ear, second injection site was 1.5 cm to 3 cm up from the first injection site in line with the tragus of the ear. The third injection site was 1.5-3 cm forward between the first 2 injection sites. The fourth injection site was 1.5 cm posterior to the second injection site. 5th site laterally in the temporalis  muscleat the level of the outer canthus.  - Patient feels her clenching is a trigger for headaches. +5 units masseter bilaterally   - Patient feels the migraines are centered around the eyes +5 units bilaterally at the outer canthus in the orbicularis occuli  -Occipitalis muscle injection, 3 sites, bilaterally. The first injection was done one half way between the occipital protuberance and the tip of the mastoid process behind the ear. The second injection site was done lateral and superior to the first, 1 fingerbreadth from the first injection. The third injection site was 1 fingerbreadth superiorly and medially from the first injection site.  -Cervical paraspinal muscle injection, 2 sites, bilateral knee first injection site was 1 cm from the midline of the cervical spine, 3 cm inferior to the lower border of the occipital protuberance. The second injection site was 1.5 cm superiorly and laterally to the first injection site.  -Trapezius muscle injection was performed at 3 sites, bilaterally. The first injection site was in the upper trapezius muscle halfway between the inflection point of the neck, and the acromion. The second injection site was one half way between the acromion and the first injection site. The third injection was done between the  first injection site and the inflection point of the neck.   Will return for repeat injection in 3 months.   A 200 unit sof Botox was used, any Botox not injected was wasted. The patient tolerated the procedure well, there were no complications of the above procedure.

## 2020-06-19 ENCOUNTER — Ambulatory Visit (INDEPENDENT_AMBULATORY_CARE_PROVIDER_SITE_OTHER): Payer: No Typology Code available for payment source | Admitting: Neurology

## 2020-06-19 DIAGNOSIS — G43711 Chronic migraine without aura, intractable, with status migrainosus: Secondary | ICD-10-CM

## 2020-06-19 NOTE — Progress Notes (Signed)
Botox- 200 units x 1 vial Lot: [id number] Expiration: 02/2023 NDC: 8768-1157-26  Bacteriostatic 0.9% Sodium Chloride- 83mL total Lot: [id number] Expiration: 08/09/2021 NDC: 7416-3845-36  Dx: I68.032 B/B

## 2020-06-24 ENCOUNTER — Ambulatory Visit: Payer: No Typology Code available for payment source | Admitting: Family

## 2020-07-10 ENCOUNTER — Encounter: Payer: Self-pay | Admitting: Family

## 2020-07-10 DIAGNOSIS — D649 Anemia, unspecified: Secondary | ICD-10-CM

## 2020-07-30 ENCOUNTER — Telehealth: Payer: Self-pay | Admitting: Family

## 2020-07-30 ENCOUNTER — Encounter: Payer: Self-pay | Admitting: Family

## 2020-07-30 DIAGNOSIS — F900 Attention-deficit hyperactivity disorder, predominantly inattentive type: Secondary | ICD-10-CM

## 2020-07-30 HISTORY — DX: Attention-deficit hyperactivity disorder, predominantly inattentive type: F90.0

## 2020-07-30 NOTE — Telephone Encounter (Signed)
See mychart.  

## 2020-08-19 MED FILL — AMLODIPINE BESYLATE 5 MG TA: 5 | 90 days supply | Qty: 90 | Fill #1

## 2020-08-19 MED FILL — LEVOCETIRIZINE 5 MG TABLET: 5 | 90 days supply | Qty: 90 | Fill #1

## 2020-08-19 MED FILL — METOPROLOL SUCCINATE ER 25: 25 | 90 days supply | Qty: 90 | Fill #1

## 2020-08-28 ENCOUNTER — Encounter: Payer: Self-pay | Admitting: Family

## 2020-08-28 ENCOUNTER — Other Ambulatory Visit: Payer: Self-pay

## 2020-08-28 ENCOUNTER — Ambulatory Visit (INDEPENDENT_AMBULATORY_CARE_PROVIDER_SITE_OTHER): Payer: No Typology Code available for payment source | Admitting: Family

## 2020-08-28 VITALS — BP 127/87 | HR 71 | Temp 98.1°F | Resp 16 | Ht 63.0 in | Wt 216.0 lb

## 2020-08-28 DIAGNOSIS — D509 Iron deficiency anemia, unspecified: Secondary | ICD-10-CM

## 2020-08-28 DIAGNOSIS — Z0000 Encounter for general adult medical examination without abnormal findings: Secondary | ICD-10-CM | POA: Diagnosis not present

## 2020-08-28 DIAGNOSIS — R739 Hyperglycemia, unspecified: Secondary | ICD-10-CM

## 2020-08-28 NOTE — Patient Instructions (Addendum)
Please complete lab work prior to leaving.    Preventive Care 21-39 Years Old, Female Preventive care refers to visits with your health care provider and lifestyle choices that can promote health and wellness. This includes:  A yearly physical exam. This may also be called an annual well check.  Regular dental visits and eye exams.  Immunizations.  Screening for certain conditions.  Healthy lifestyle choices, such as eating a healthy diet, getting regular exercise, not using drugs or products that contain nicotine and tobacco, and limiting alcohol use. What can I expect for my preventive care visit? Physical exam Your health care provider will check your:  Height and weight. This may be used to calculate body mass index (BMI), which tells if you are at a healthy weight.  Heart rate and blood pressure.  Skin for abnormal spots. Counseling Your health care provider may ask you questions about your:  Alcohol, tobacco, and drug use.  Emotional well-being.  Home and relationship well-being.  Sexual activity.  Eating habits.  Work and work environment.  Method of birth control.  Menstrual cycle.  Pregnancy history. What immunizations do I need?  Influenza (flu) vaccine  This is recommended every year. Tetanus, diphtheria, and pertussis (Tdap) vaccine  You may need a Td booster every 10 years. Varicella (chickenpox) vaccine  You may need this if you have not been vaccinated. Human papillomavirus (HPV) vaccine  If recommended by your health care provider, you may need three doses over 6 months. Measles, mumps, and rubella (MMR) vaccine  You may need at least one dose of MMR. You may also need a second dose. Meningococcal conjugate (MenACWY) vaccine  One dose is recommended if you are age 19-21 years and a first-year college student living in a residence hall, or if you have one of several medical conditions. You may also need additional booster  doses. Pneumococcal conjugate (PCV13) vaccine  You may need this if you have certain conditions and were not previously vaccinated. Pneumococcal polysaccharide (PPSV23) vaccine  You may need one or two doses if you smoke cigarettes or if you have certain conditions. Hepatitis A vaccine  You may need this if you have certain conditions or if you travel or work in places where you may be exposed to hepatitis A. Hepatitis B vaccine  You may need this if you have certain conditions or if you travel or work in places where you may be exposed to hepatitis B. Haemophilus influenzae type b (Hib) vaccine  You may need this if you have certain conditions. You may receive vaccines as individual doses or as more than one vaccine together in one shot (combination vaccines). Talk with your health care provider about the risks and benefits of combination vaccines. What tests do I need?  Blood tests  Lipid and cholesterol levels. These may be checked every 5 years starting at age 20.  Hepatitis C test.  Hepatitis B test. Screening  Diabetes screening. This is done by checking your blood sugar (glucose) after you have not eaten for a while (fasting).  Sexually transmitted disease (STD) testing.  BRCA-related cancer screening. This may be done if you have a family history of breast, ovarian, tubal, or peritoneal cancers.  Pelvic exam and Pap test. This may be done every 3 years starting at age 21. Starting at age 30, this may be done every 5 years if you have a Pap test in combination with an HPV test. Talk with your health care provider about your test results,   options, and if necessary, the need for more tests. Follow these instructions at home: Eating and drinking   Eat a diet that includes fresh fruits and vegetables, whole grains, lean protein, and low-fat dairy.  Take vitamin and mineral supplements as recommended by your health care provider.  Do not drink alcohol  if: ? Your health care provider tells you not to drink. ? You are pregnant, may be pregnant, or are planning to become pregnant.  If you drink alcohol: ? Limit how much you have to 0-1 drink a day. ? Be aware of how much alcohol is in your drink. In the [place] one drink equals one 12 oz bottle of beer (355 mL), one 5 oz glass of wine (148 mL), or one 1 oz glass of hard liquor (44 mL). Lifestyle  Take daily care of your teeth and gums.  Stay active. Exercise for at least 30 minutes on 5 or more days each week.  Do not use any products that contain nicotine or tobacco, such as cigarettes, e-cigarettes, and chewing tobacco. If you need help quitting, ask your health care provider.  If you are sexually active, practice safe sex. Use a condom or other form of birth control (contraception) in order to prevent pregnancy and STIs (sexually transmitted infections). If you plan to become pregnant, see your health care provider for a preconception visit. What's next?  Visit your health care provider once a year for a well check visit.  Ask your health care provider how often you should have your eyes and teeth checked.  Stay up to date on all vaccines. This information is not intended to replace advice given to you by your health care provider. Make sure you discuss any questions you have with your health care provider. Document Revised: 07/07/2018 Document Reviewed: 07/07/2018 Elsevier Patient Education  2020 [employer]. Please schedule routine vision and dental visits.  Continue to work on [employer], regular exercise and weight loss.    Preventive Care 79-9 Years Old, Female Preventive care refers to visits with your health care provider and lifestyle choices that can promote health and wellness. This includes:  A yearly physical exam. This may also be called an annual well check.  Regular dental visits and eye exams.  Immunizations.  Screening for certain conditions.  Healthy  lifestyle choices, such as eating a healthy diet, getting regular exercise, not using drugs or products that contain nicotine and tobacco, and limiting alcohol use. What can I expect for my preventive care visit? Physical exam Your health care provider will check your:  Height and weight. This may be used to calculate body mass index (BMI), which tells if you are at a healthy weight.  Heart rate and blood pressure.  Skin for abnormal spots. Counseling Your health care provider may ask you questions about your:  Alcohol, tobacco, and drug use.  Emotional well-being.  Home and relationship well-being.  Sexual activity.  Eating habits.  Work and work Astr[employer].  Method of birth control.  Menstrual cycle.  Pregnancy history. What immunizations do I need?  Influenza (flu) vaccine  This is recommended every year. Tetanus, diphtheria, and pertussis (Tdap) vaccine  You may need a Td booster every 10 years. Varicella (chickenpox) vaccine  You may need this if you have not been vaccinated. Human papillomavirus (HPV) vaccine  If recommended by your health care provider, you may need three doses over 6 months. Measles, mumps, and rubella (MMR) vaccine  You may need at least one dose  of MMR. You may also need a second dose. Meningococcal conjugate (MenACWY) vaccine  One dose is recommended if you are age 59-21 years and a first-year college student living in a residence hall, or if you have one of several medical conditions. You may also need additional booster doses. Pneumococcal conjugate (PCV13) vaccine  You may need this if you have certain conditions and were not previously vaccinated. Pneumococcal polysaccharide (PPSV23) vaccine  You may need one or two doses if you smoke cigarettes or if you have certain conditions. Hepatitis A vaccine  You may need this if you have certain conditions or if you travel or work in places where you may be exposed to hepatitis  A. Hepatitis B vaccine  You may need this if you have certain conditions or if you travel or work in places where you may be exposed to hepatitis B. Haemophilus influenzae type b (Hib) vaccine  You may need this if you have certain conditions. You may receive vaccines as individual doses or as more than one vaccine together in one shot (combination vaccines). Talk with your health care provider about the risks and benefits of combination vaccines. What tests do I need?  Blood tests  Lipid and cholesterol levels. These may be checked every 5 years starting at age 71.  Hepatitis C test.  Hepatitis B test. Screening  Diabetes screening. This is done by checking your blood sugar (glucose) after you have not eaten for a while (fasting).  Sexually transmitted disease (STD) testing.  BRCA-related cancer screening. This may be done if you have a family history of breast, ovarian, tubal, or peritoneal cancers.  Pelvic exam and Pap test. This may be done every 3 years starting at age 70. Starting at age 7, this may be done every 5 years if you have a Pap test in combination with an HPV test. Talk with your health care provider about your test results, treatment options, and if necessary, the need for more tests. Follow these instructions at home: Eating and drinking   Eat a diet that includes fresh fruits and vegetables, whole grains, lean protein, and low-fat dairy.  Take vitamin and mineral supplements as recommended by your health care provider.  Do not drink alcohol if: ? Your health care provider tells you not to drink. ? You are pregnant, may be pregnant, or are planning to become pregnant.  If you drink alcohol: ? Limit how much you have to 0-1 drink a day. ? Be aware of how much alcohol is in your drink. In the [place] one drink equals one 12 oz bottle of beer (355 mL), one 5 oz glass of wine (148 mL), or one 1 oz glass of hard liquor (44 mL). Lifestyle  Take daily care of  your teeth and gums.  Stay active. Exercise for at least 30 minutes on 5 or more days each week.  Do not use any products that contain nicotine or tobacco, such as cigarettes, e-cigarettes, and chewing tobacco. If you need help quitting, ask your health care provider.  If you are sexually active, practice safe sex. Use a condom or other form of birth control (contraception) in order to prevent pregnancy and STIs (sexually transmitted infections). If you plan to become pregnant, see your health care provider for a preconception visit. What's next?  Visit your health care provider once a year for a well check visit.  Ask your health care provider how often you should have your eyes and teeth checked.  Stay  up to date on all vaccines. This information is not intended to replace advice given to you by your health care provider. Make sure you discuss any questions you have with your health care provider. Document Revised: 07/07/2018 Document Reviewed: 07/07/2018 Elsevier Patient Education  2020 [employer].

## 2020-08-28 NOTE — Progress Notes (Signed)
Subjective:    Patient ID: Kaitlin Foster, female    DOB: December 20, 1980, 39 y.o.   MRN: [id number]  HPI  Patient presents today for complete physical.  Immunizations:  Completed Pfizer series, flu shot up to date. Tdap 2015 Diet: needs improvement, eats a lot of take out.  Does not cook much Exercise: Recently joined the [place] from Last 3 Encounters:  08/28/20 216 lb (98 kg)  06/11/20 213 lb 4 oz (96.7 kg)  02/27/20 210 lb (95.3 kg)  Pap Smear: 03/10/19 Vision: due Dental: due   Review of Systems  Constitutional: Negative for unexpected weight change.  HENT: Negative for hearing loss and rhinorrhea.   Eyes: Negative for visual disturbance.  Respiratory: Negative for cough and shortness of breath.   Cardiovascular: Negative for leg swelling.  Gastrointestinal: Positive for constipation (occasional). Negative for diarrhea.  Genitourinary: Positive for menstrual problem (heavy x 3 days, lasts 5 days. has a fibroid- following with GYN). Negative for dysuria and frequency.  Musculoskeletal: Negative for arthralgias and myalgias.  Skin: Negative for rash.  Neurological: Positive for headaches (sees neuro and does botox ).  Hematological: Negative for adenopathy.  Psychiatric/Behavioral:       Denies depression/anxiety    Past Medical History:  Diagnosis Date  . 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 Anxiety   . Chronic migraine    neurologist-  dr Jaynee Eagles--- botox injecitons every 3 months  . GERD (gastroesophageal reflux disease)   . 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 Hypertension   . Left ureteral stone   . Migraines   .  Nephrolithiasis    left renal nonobstructive stone per CT 06-14-2019  . Uterine fibroid      Social History   Socioeconomic History  . Marital status: Married    Spouse name: Thad  . Number of children: 1  . Years of education: Not on file  . Highest education level: Associate degree: academic program  Occupational History  . Occupation: Therapist, sports  Tobacco Use  . Smoking status: Never Smoker  . Smokeless tobacco: Never Used  Vaping Use  . Vaping Use: Never used  Substance and Sexual Activity  . Alcohol use: No    Alcohol/week: 0.0 standard drinks  . Drug use: No  . Sexual activity: Yes    Birth control/protection: Condom  Other Topics Concern  . 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 Difficulty of Paying Living Expenses: Not on file  Food Insecurity:   . Worried About Charity fundraiser in the Last Year: Not on file  . Ran Out of Food in the Last Year: Not on file  Transportation Needs:   . Lack of Transportation (Medical): Not on file  . Lack of Transportation (Non-Medical): Not on file  Physical Activity:   .  Days of Exercise per Week: Not on file  . Minutes of Exercise per Session: Not on file  Stress:   . Feeling of Stress : Not on file  Social Connections:   . Frequency of Communication with Friends and Family: Not on file  . Frequency of Social Gatherings with Friends and Family: Not on file  . Attends Religious Services: Not on file  . Active Member of Clubs or Organizations: Not on file  . Attends Archivist Meetings: Not on file  . Marital Status: Not on file  Intimate Partner Violence:   . Fear of Current or Ex-Partner: Not on file  . Emotionally Abused: Not on file  . Physically Abused:  Not on file  . Sexually Abused: Not on file    Past Surgical History:  Procedure Laterality Date  . CEREBRAL EMBOLIZATION  Sept 11th and 18th, 2008   @[facility]    2 staged embolization left ACA supply to AVM  . CESAREAN SECTION  07/06/2009  . CRANIOTOMY  07-29-2007   @ [facility]   resection left frontal AVM    Family History  Problem Relation Age of Onset  . Hypertension Father   . Alcohol abuse Father   . Arthritis Father   . Obesity Father   . Hypertension Mother   . Arthritis Mother   . Migraines Mother   . Obesity Mother   . Cancer Mother        sinus tumor  . Migraines Sister   . Ulcerative colitis Sister   . Migraines Maternal Aunt   . ADD / ADHD Son   . Cancer Maternal Grandmother        colon diagnosed at age 2  . Dementia Maternal Grandmother   . Heart disease Neg Hx     Allergies  Allergen Reactions  . Codeine Other (See Comments)    REACTION: upset stomach once, but has taken it other times and been ok  . Lisinopril Other (See Comments)    cough  . Sulfa Antibiotics Hives    Current Outpatient Medications on File Prior to Visit  Medication Sig Dispense Refill  . amLODipine (NORVASC) 5 MG tablet Take 1 tablet (5 mg total) by mouth daily. 90 tablet 1  . famotidine (PEPCID) 20 MG tablet Take 20 mg by mouth daily as needed for heartburn or indigestion.     . fluticasone (FLONASE) 50 MCG/ACT nasal spray Place into both nostrils as needed for allergies or rhinitis.     [name] levocetirizine (XYZAL) 5 MG tablet Take 1 tablet (5 mg total) by mouth every evening. 90 tablet 3  . metoprolol succinate (TOPROL-XL) 25 MG 24 hr tablet Take 1 tablet (25 mg total) by mouth daily. 90 tablet 1  . ondansetron (ZOFRAN ODT) 4 MG disintegrating tablet Take 1 tablet (4 mg total) by mouth every 8 (eight) hours as needed for nausea or vomiting. 20 tablet 0  . Rimegepant Sulfate (NURTEC) 75 MG TBDP Take 75 mg by mouth daily as needed. For migraines. Take as close to onset of migraine as  possible. One daily maximum. 10 tablet 6  . rizatriptan (MAXALT-MLT) 10 MG disintegrating tablet Take 1 tablet (10 mg total) by mouth as needed for migraine. May repeat in 2 hours if needed 9 tablet 11   No current facility-administered medications on file prior to visit.    BP 127/87 (BP Location: Right Arm, Patient Position: Sitting, Cuff Size: Small)   Pulse 71   Temp 98.1 F (36.7 C) (  Oral)   Resp 16   Ht 5\' 3"  (1.6 m)   Wt 216 lb (98 kg)   SpO2 100%   BMI 38.26 kg/m       Objective:   Physical Exam  Physical Exam  Constitutional: She is oriented to person, place, and time. She appears well-developed and well-nourished. No distress.  HENT:  Head: Normocephalic and atraumatic.  Right Ear: Tympanic membrane and ear canal normal.  Left Ear: Tympanic membrane and ear canal normal.  Mouth/Throat: Not examined- pt wearing mask Eyes: Pupils are equal, round, and reactive to light. No scleral icterus.  Neck: Normal range of motion. No thyromegaly present.  Cardiovascular: Normal rate and regular rhythm.   No murmur heard. Pulmonary/Chest: Effort normal and breath sounds normal. No respiratory distress. He has no wheezes. She has no rales. She exhibits no tenderness.  Abdominal: Soft. Bowel sounds are normal. She exhibits no distension and no mass. There is no tenderness. There is no rebound and no guarding.  Musculoskeletal: She exhibits no edema.  Lymphadenopathy:    She has no cervical adenopathy.  Neurological: She is alert and oriented to person, place, and time. She has normal patellar reflexes. She exhibits normal muscle tone. Coordination normal.  Skin: Skin is warm and dry.  Psychiatric: She has a normal mood and affect. Her behavior is normal. Judgment and thought content normal.  Breast/pelvic: deferred            Assessment & Plan:   Preventative care- discussed healthy diet, exercise, and weight loss.  Immunizations reviewed and up to date. Pap up to  date.  This visit occurred during the SARS-CoV-2 public health emergency.  Safety protocols were in place, including screening questions prior to the visit, additional usage of staff PPE, and extensive cleaning of exam room while observing appropriate contact time as indicated for disinfecting solutions.          Assessment & Plan:

## 2020-08-29 LAB — CBC WITH DIFFERENTIAL/PLATELET
Absolute Monocytes: 543 cells/uL (ref 200–950)
Basophils Absolute: 31 cells/uL (ref 0–200)
Basophils Relative: 0.5 %
Eosinophils Absolute: 159 cells/uL (ref 15–500)
Eosinophils Relative: 2.6 %
HCT: 40.1 % (ref 35.0–45.0)
Hemoglobin: 12 g/dL (ref 11.7–15.5)
Lymphs Abs: 1714 cells/uL (ref 850–3900)
MCH: 24.3 pg — ABNORMAL LOW (ref 27.0–33.0)
MCHC: 29.9 g/dL — ABNORMAL LOW (ref 32.0–36.0)
MCV: 81.2 fL (ref 80.0–100.0)
MPV: 10.1 fL (ref 7.5–12.5)
Monocytes Relative: 8.9 %
Neutro Abs: 3654 cells/uL (ref 1500–7800)
Neutrophils Relative %: 59.9 %
Platelets: 309 10*3/uL (ref 140–400)
RBC: 4.94 10*6/uL (ref 3.80–5.10)
RDW: 16.9 % — ABNORMAL HIGH (ref 11.0–15.0)
Total Lymphocyte: 28.1 %
WBC: 6.1 10*3/uL (ref 3.8–10.8)

## 2020-08-29 LAB — COMPREHENSIVE METABOLIC PANEL
AG Ratio: 1.6 (calc) (ref 1.0–2.5)
ALT: 15 U/L (ref 6–29)
AST: 17 U/L (ref 10–30)
Albumin: 4.1 g/dL (ref 3.6–5.1)
Alkaline phosphatase (APISO): 80 U/L (ref 31–125)
BUN: 14 mg/dL (ref 7–25)
CO2: 27 mmol/L (ref 20–32)
Calcium: 9.2 mg/dL (ref 8.6–10.2)
Chloride: 105 mmol/L (ref 98–110)
Creat: 0.8 mg/dL (ref 0.50–1.10)
Globulin: 2.5 g/dL (calc) (ref 1.9–3.7)
Glucose, Bld: 94 mg/dL (ref 65–99)
Potassium: 4.7 mmol/L (ref 3.5–5.3)
Sodium: 140 mmol/L (ref 135–146)
Total Bilirubin: 0.3 mg/dL (ref 0.2–1.2)
Total Protein: 6.6 g/dL (ref 6.1–8.1)

## 2020-08-29 LAB — IRON, TOTAL/TOTAL IRON BINDING CAP
%SAT: 7 % (calc) — ABNORMAL LOW (ref 16–45)
Iron: 24 ug/dL — ABNORMAL LOW (ref 40–190)
TIBC: 354 mcg/dL (calc) (ref 250–450)

## 2020-08-29 LAB — HEMOGLOBIN A1C
Hgb A1c MFr Bld: 5.2 % of total Hgb (ref <5.7)
Mean Plasma Glucose: 103 (calc)
eAG (mmol/L): 5.7 (calc)

## 2020-08-30 ENCOUNTER — Telehealth: Payer: Self-pay | Admitting: Family

## 2020-08-30 ENCOUNTER — Other Ambulatory Visit: Payer: Self-pay | Admitting: Family

## 2020-08-30 MED ORDER — CERTA-VITE PO LIQD: 5.0000 mL | Freq: Every day | ORAL | 0 refills | Status: DC

## 2020-08-30 MED ORDER — IRON 325 (65 FE) MG PO TABS: 1.0000 | ORAL_TABLET | Freq: Every day | ORAL | 0 refills | Status: DC

## 2020-08-30 NOTE — Telephone Encounter (Signed)
See mychart.  

## 2020-09-10 ENCOUNTER — Other Ambulatory Visit: Payer: Self-pay | Admitting: Neurology

## 2020-09-10 ENCOUNTER — Other Ambulatory Visit: Payer: Self-pay

## 2020-09-10 ENCOUNTER — Ambulatory Visit (INDEPENDENT_AMBULATORY_CARE_PROVIDER_SITE_OTHER): Payer: No Typology Code available for payment source | Admitting: Family

## 2020-09-10 VITALS — BP 150/100 | HR 83 | Temp 98.4°F | Resp 16 | Ht 63.0 in | Wt 215.0 lb

## 2020-09-10 DIAGNOSIS — F900 Attention-deficit hyperactivity disorder, predominantly inattentive type: Secondary | ICD-10-CM

## 2020-09-10 DIAGNOSIS — I10 Essential (primary) hypertension: Secondary | ICD-10-CM | POA: Diagnosis not present

## 2020-09-10 MED ORDER — METOPROLOL SUCCINATE ER 50 MG PO TB24: 50.0000 mg | ORAL_TABLET | Freq: Every day | ORAL | 0 refills | Status: DC

## 2020-09-10 MED FILL — ONDANSETRON ODT 4 MG TABLET: 4 | 10 days supply | Qty: 30 | Fill #0

## 2020-09-10 MED FILL — METOPROLOL SUCCINATE ER 50: 50 | 90 days supply | Qty: 90 | Fill #0

## 2020-09-10 MED FILL — NURTEC 75 MG TBDP: 75 | 30 days supply | Qty: 8 | Fill #0

## 2020-09-10 NOTE — Patient Instructions (Signed)
Please increase toprol xl to 50mg  once daily.

## 2020-09-10 NOTE — Progress Notes (Signed)
Subjective:    Patient ID: Kaitlin Foster, female    DOB: 02-11-1981, 39 y.o.   MRN: [id number]  HPI   Patient is a 39 yr old female who presents today for follow up.   HTN-  On amlodipine 5mg , and toprol xl 25mg .   BP Readings from Last 3 Encounters:  09/10/20 (!) 150/100  08/28/20 127/87  06/11/20 120/87   ADHD-She had recent formal evaluation and tested positive for ADHD. She is interested in treatment. Evaluation is reviewed and is scanned into her medical record.     Review of Systems     Past Medical History:  Diagnosis Date  . 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 Anxiety   . Chronic migraine    neurologist-  dr Jaynee Eagles--- botox injecitons every 3 months  . GERD (gastroesophageal reflux disease)   . 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 Hypertension   . Left ureteral stone   . Migraines   . Nephrolithiasis    left renal nonobstructive stone per CT 06-14-2019  . Uterine fibroid      Social History   Socioeconomic History  . Marital status: Married    Spouse name: Thad  . Number of children: 1  . Years of education: Not on file  . Highest education level: Associate degree: academic program  Occupational History  . Occupation: Therapist, sports  Tobacco Use  . Smoking status: Never Smoker  . Smokeless tobacco: Never Used  Vaping Use  . Vaping Use: Never used  Substance and Sexual Activity  . Alcohol use: No    Alcohol/week: 0.0 standard drinks  . Drug use: No  . Sexual activity: Yes    Birth control/protection: Condom  Other Topics Concern  . 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 Difficulty of Paying Living Expenses: Not on file  Food Insecurity:   . Worried About Charity fundraiser in the Last Year: Not on file  . Ran Out of Food in the Last Year: Not on file  Transportation Needs:   . Lack of Transportation (Medical): Not on file  . Lack of Transportation (Non-Medical): Not on file  Physical Activity:   . Days of Exercise per Week: Not on file  . Minutes of Exercise per Session: Not on file  Stress:   . Feeling of Stress : Not on file  Social Connections:   . Frequency of Communication with Friends and Family: Not on file  . Frequency of Social Gatherings with Friends and Family: Not on file  . Attends Religious Services: Not on file  . Active Member of Clubs or Organizations: Not on file  . Attends Archivist Meetings: Not on file  . Marital Status: Not on file  Intimate Partner Violence:   . Fear of Current or  Ex-Partner: Not on file  . Emotionally Abused: Not on file  . Physically Abused: Not on file  . Sexually Abused: Not on file    Past Surgical History:  Procedure Laterality Date  . CEREBRAL EMBOLIZATION  Sept 11th and 18th, 2008   @[facility]    2 staged embolization left ACA supply to AVM  . CESAREAN SECTION  07/06/2009  . CRANIOTOMY  07-29-2007   @ [facility]   resection left frontal AVM    Family History  Problem Relation Age of Onset  . Hypertension Father   . Alcohol abuse Father   . Arthritis Father   . Obesity Father   . Hypertension Mother   . Arthritis Mother   . Migraines Mother   . Obesity Mother   . Cancer Mother        sinus tumor  . Migraines Sister   . Ulcerative colitis Sister   . Migraines Maternal Aunt   . ADD / ADHD Son   . Cancer Maternal Grandmother        colon diagnosed at age 28  . Dementia Maternal  Grandmother   . Heart disease Neg Hx     Allergies  Allergen Reactions  . Codeine Other (See Comments)    REACTION: upset stomach once, but has taken it other times and been ok  . Lisinopril Other (See Comments)    cough  . Sulfa Antibiotics Hives    Current Outpatient Medications on File Prior to Visit  Medication Sig Dispense Refill  . amLODipine (NORVASC) 5 MG tablet Take 1 tablet (5 mg total) by mouth daily. 90 tablet 1  . famotidine (PEPCID) 20 MG tablet Take 20 mg by mouth daily as needed for heartburn or indigestion.     . Ferrous Sulfate (IRON) 325 (65 Fe) MG TABS Take 1 tablet (325 mg total) by mouth daily. 30 tablet 0  . fluticasone (FLONASE) 50 MCG/ACT nasal spray Place into both nostrils as needed for allergies or rhinitis.     [name] levocetirizine (XYZAL) 5 MG tablet Take 1 tablet (5 mg total) by mouth every evening. 90 tablet 3  . metoprolol succinate (TOPROL-XL) 25 MG 24 hr tablet Take 1 tablet (25 mg total) by mouth daily. 90 tablet 1  . ondansetron (ZOFRAN ODT) 4 MG disintegrating tablet Take 1 tablet (4 mg total) by mouth every 8 (eight) hours as needed for nausea or vomiting. 20 tablet 0  . Rimegepant Sulfate (NURTEC) 75 MG TBDP Take 75 mg by mouth daily as needed. For migraines. Take as close to onset of migraine as possible. One daily maximum. 10 tablet 6  . rizatriptan (MAXALT-MLT) 10 MG disintegrating tablet Take 1 tablet (10 mg total) by mouth as needed for migraine. May repeat in 2 hours if needed 9 tablet 11   No current facility-administered medications on file prior to visit.    BP (!) 150/100 (BP Location: Right Arm, Patient Position: Sitting, Cuff Size: Small)   Pulse 83   Temp 98.4 F (36.9 C) (Oral)   Resp 16   Ht 5\' 3"  (1.6 m)   Wt 215 lb (97.5 kg)   SpO2 98%   BMI 38.09 kg/m    Objective:   Physical Exam Constitutional:      Appearance: She is well-developed.  Neck:     Thyroid: No thyromegaly.  Cardiovascular:     Rate and Rhythm: Normal  rate and regular rhythm.     Heart sounds: Normal heart sounds. No murmur heard.  Pulmonary:     Effort: Pulmonary effort is normal. No respiratory distress.     Breath sounds: Normal breath sounds. No wheezing.  Musculoskeletal:     Cervical back: Neck supple.  Skin:    General: Skin is warm and dry.  Neurological:     Mental Status: She is alert and oriented to person, place, and time.  Psychiatric:        Behavior: Behavior normal.        Thought Content: Thought content normal.        Judgment: Judgment normal.           Assessment & Plan:  HTN- uncontrolled. Will increase toprol xl from 25mg  to 50mg  once daily.   ADHD- had formal evaluation and tested positive. We discussed that we need to get her blood pressure under control before we can consider the addition of a stimulant.  This visit occurred during the SARS-CoV-2 public health emergency.  Safety protocols were in place, including screening questions prior to the visit, additional usage of staff PPE, and extensive cleaning of exam room while observing appropriate contact time as indicated for disinfecting solutions.

## 2020-09-17 ENCOUNTER — Other Ambulatory Visit: Payer: Self-pay | Admitting: Family

## 2020-09-17 ENCOUNTER — Other Ambulatory Visit: Payer: Self-pay

## 2020-09-17 ENCOUNTER — Encounter: Payer: Self-pay | Admitting: Family

## 2020-09-17 ENCOUNTER — Ambulatory Visit (INDEPENDENT_AMBULATORY_CARE_PROVIDER_SITE_OTHER): Payer: No Typology Code available for payment source | Admitting: Family

## 2020-09-17 VITALS — BP 115/71 | HR 73 | Temp 98.7°F | Resp 16 | Wt 216.0 lb

## 2020-09-17 DIAGNOSIS — F909 Attention-deficit hyperactivity disorder, unspecified type: Secondary | ICD-10-CM

## 2020-09-17 DIAGNOSIS — I10 Essential (primary) hypertension: Secondary | ICD-10-CM | POA: Diagnosis not present

## 2020-09-17 MED ORDER — AMPHETAMINE-DEXTROAMPHETAMINE 5 MG PO TABS: 5.0000 mg | ORAL_TABLET | Freq: Two times a day (BID) | ORAL | 0 refills | Status: DC

## 2020-09-17 MED FILL — DEXTROAMP-AMPHETAMINE 5 MG: 5 | 30 days supply | Qty: 60 | Fill #0

## 2020-09-17 NOTE — Progress Notes (Signed)
Subjective:    Patient ID: Kaitlin Foster, female    DOB: September 26, 1981, 39 y.o.   MRN: [id number]  HPI  Patient is a 39 yr old female who presents today for follow up of her hypertension. Last visit blood pressure was noted to be elevated and we increased her toprol xl from 25mg  to 50mg  once daily.  ADHD- had formal evaluation. Desires treatment.    Review of Systems See HPI    Past Medical History:  Diagnosis Date  . 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 Anxiety   . Chronic migraine    neurologist-  dr Jaynee Eagles--- botox injecitons every 3 months  . GERD (gastroesophageal reflux disease)   . 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 Hypertension   . Left ureteral stone   . Migraines   . Nephrolithiasis    left renal nonobstructive stone per CT 06-14-2019  . Uterine fibroid      Social History   Socioeconomic History  . Marital status: Married    Spouse name: Thad  . Number of children: 1  . Years of education: Not on file  . Highest education level: Associate degree: academic program  Occupational History  . Occupation: Therapist, sports  Tobacco Use  . Smoking status: Never Smoker  . Smokeless tobacco: Never Used  Vaping Use  . Vaping Use: Never used  Substance and Sexual Activity  . Alcohol use: No    Alcohol/week: 0.0 standard drinks  . Drug use: No  . Sexual activity: Yes    Birth control/protection: Condom  Other Topics Concern  . 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 Difficulty of Paying Living Expenses: Not on file  Food Insecurity:   . Worried About Charity fundraiser in the Last Year: Not on file  . Ran Out of Food in the Last Year: Not on file  Transportation Needs:   . Lack of Transportation (Medical): Not on file  . Lack of Transportation (Non-Medical): Not on file  Physical Activity:   . Days of Exercise per Week: Not on file  . Minutes of Exercise per Session: Not on file  Stress:   . Feeling of Stress : Not on file  Social Connections:   . Frequency of Communication with Friends and Family: Not on file  . Frequency of Social Gatherings with Friends and Family: Not on file  . Attends Religious Services: Not on file  . Active Member of Clubs or Organizations: Not on file  . Attends Archivist Meetings: Not on file  . Marital Status: Not on file  Intimate Partner Violence:   . Fear of Current or Ex-Partner: Not on file  . Emotionally Abused: Not on file  . Physically Abused: Not on file  . Sexually Abused: Not on file  Past Surgical History:  Procedure Laterality Date  . CEREBRAL EMBOLIZATION  Sept 11th and 18th, 2008   @[facility]    2 staged embolization left ACA supply to AVM  . CESAREAN SECTION  07/06/2009  . CRANIOTOMY  07-29-2007   @ [facility]   resection left frontal AVM    Family History  Problem Relation Age of Onset  . Hypertension Father   . Alcohol abuse Father   . Arthritis Father   . Obesity Father   . Hypertension Mother   . Arthritis Mother   . Migraines Mother   . Obesity Mother   . Cancer Mother        sinus tumor  . Migraines Sister   . Ulcerative colitis Sister   . Migraines Maternal Aunt   . ADD / ADHD Son   . Cancer Maternal Grandmother        colon diagnosed at age 63  . Dementia Maternal Grandmother   . Heart disease Neg Hx     Allergies  Allergen Reactions  . Codeine Other (See Comments)    REACTION: upset  stomach once, but has taken it other times and been ok  . Lisinopril Other (See Comments)    cough  . Sulfa Antibiotics Hives    Current Outpatient Medications on File Prior to Visit  Medication Sig Dispense Refill  . amLODipine (NORVASC) 5 MG tablet Take 1 tablet (5 mg total) by mouth daily. 90 tablet 1  . famotidine (PEPCID) 20 MG tablet Take 20 mg by mouth daily as needed for heartburn or indigestion.     . Ferrous Sulfate (IRON) 325 (65 Fe) MG TABS Take 1 tablet (325 mg total) by mouth daily. 30 tablet 0  . fluticasone (FLONASE) 50 MCG/ACT nasal spray Place into both nostrils as needed for allergies or rhinitis.     [name] levocetirizine (XYZAL) 5 MG tablet Take 1 tablet (5 mg total) by mouth every evening. 90 tablet 3  . metoprolol succinate (TOPROL XL) 50 MG 24 hr tablet Take 1 tablet (50 mg total) by mouth daily. Take with or immediately following a meal. 90 tablet 0  . NURTEC 75 MG TBDP TAKE 1 TABLET (75 MG) BY MOUTH DAILY AS NEEDED FOR MIGRAINES. TAKE AS CLOSE TO ONSET OF MIGRAINE AS POSSIBLE. ONE DAILY MAXIMUM. 8 tablet 6  . ondansetron (ZOFRAN-ODT) 4 MG disintegrating tablet DISSOLVE 1 TABLET (4 MG TOTAL) BY MOUTH EVERY 8 (EIGHT) HOURS AS NEEDED FOR NAUSEA. 30 tablet 3  . rizatriptan (MAXALT-MLT) 10 MG disintegrating tablet Take 1 tablet (10 mg total) by mouth as needed for migraine. May repeat in 2 hours if needed 9 tablet 11   No current facility-administered medications on file prior to visit.    BP 115/71 (BP Location: Right Arm, Patient Position: Sitting, Cuff Size: Large)   Pulse 73   Temp 98.7 F (37.1 C) (Oral)   Resp 16   Wt 216 lb (98 kg)   SpO2 100%   BMI 38.26 kg/m    Objective:   Physical Exam Constitutional:      Appearance: She is well-developed.  Cardiovascular:     Rate and Rhythm: Normal rate and regular rhythm.     Heart sounds: Normal heart sounds. No murmur heard.   Pulmonary:     Effort: Pulmonary effort is normal. No respiratory distress.      Breath sounds: Normal breath sounds. No wheezing.  Psychiatric:        Behavior: Behavior normal.  Thought Content: Thought content normal.        Judgment: Judgment normal.           Assessment & Plan:  HTN- bp is now at goal. Continue amlodipine 5mg  and toprol xl 50mg  once daily.   ADHD- uncontrolled. Will initiate adderall 5mg  bid.  A controlled substance contract is signed and UDS will be ordered.  This visit occurred during the SARS-CoV-2 public health emergency.  Safety protocols were in place, including screening questions prior to the visit, additional usage of staff PPE, and extensive cleaning of exam room while observing appropriate contact time as indicated for disinfecting solutions.

## 2020-09-17 NOTE — Patient Instructions (Addendum)
Please complete lab work prior to leaving. Start Adderall twice daily.

## 2020-09-18 LAB — DRUG MONITORING, PANEL 8 WITH CONFIRMATION, URINE
6 Acetylmorphine: NEGATIVE ng/mL (ref <10)
Alcohol Metabolites: NEGATIVE ng/mL
Amphetamines: NEGATIVE ng/mL (ref <500)
Benzodiazepines: NEGATIVE ng/mL (ref <100)
Buprenorphine, Urine: NEGATIVE ng/mL (ref <5)
Cocaine Metabolite: NEGATIVE ng/mL (ref <150)
Creatinine: 38 mg/dL
MDMA: NEGATIVE ng/mL (ref <500)
Marijuana Metabolite: NEGATIVE ng/mL (ref <20)
Opiates: NEGATIVE ng/mL (ref <100)
Oxidant: NEGATIVE ug/mL
Oxycodone: NEGATIVE ng/mL (ref <100)
pH: 7.7 (ref 4.5–9.0)

## 2020-09-18 LAB — DM TEMPLATE

## 2020-09-30 ENCOUNTER — Telehealth: Payer: Self-pay | Admitting: Neurology

## 2020-09-30 NOTE — Telephone Encounter (Signed)
Patient has a Botox injection tomorrow. Her previous PA with [facility] for Botox expired on 10/28. I called [facility] [phone] and spoke with Velna Hatchet to see if a continuation request could be completed. Brandi transferred me to [facility] in medical management. Hailey states the request is pending clinical review and advised me to fax clinicals to [phone].

## 2020-10-01 ENCOUNTER — Ambulatory Visit (INDEPENDENT_AMBULATORY_CARE_PROVIDER_SITE_OTHER): Payer: No Typology Code available for payment source | Admitting: Neurology

## 2020-10-01 DIAGNOSIS — G43711 Chronic migraine without aura, intractable, with status migrainosus: Secondary | ICD-10-CM

## 2020-10-01 NOTE — Telephone Encounter (Signed)
Received approval from [facility] via fax, [place] [phone] (09/30/20- 09/30/21).

## 2020-10-01 NOTE — Progress Notes (Signed)
Consent Form Botulism Toxin Injection For Chronic Migraine  Interval history 10/01/2020: Baseline daily headaches and16 migraine days. Since last botox she has had only 3 total very mild migraines a month and acute management works, nurtec works with zofran, she had side effects to zembrace, also [place] works, takes with zofran. More than 25 free headache days a month, exceptional improvement!+all.  She is very happy. She is on adderall now but not causing headache.   Orders: [facility] or [place] for dry needling for cervical and lumbar/lumbosacral dry needling for myofascial disease in the past  No orders of the defined types were placed in this encounter.  No orders of the defined types were placed in this encounter.   Reviewed orally with patient, additionally signature is on file:  Botulism toxin has been approved by the Federal drug administration for treatment of chronic migraine. Botulism toxin does not cure chronic migraine and it may not be effective in some patients.  The administration of botulism toxin is accomplished by injecting a small amount of toxin into the muscles of the neck and head. Dosage must be titrated for each individual. Any benefits resulting from botulism toxin tend to wear off after 3 months with a repeat injection required if benefit is to be maintained. Injections are usually done every 3-4 months with maximum effect peak achieved by about 2 or 3 weeks. Botulism toxin is expensive and you should be sure of what costs you will incur resulting from the injection.  The side effects of botulism toxin use for chronic migraine may include:   -Transient, and usually mild, facial weakness with facial injections  -Transient, and usually mild, head or neck weakness with head/neck injections  -Reduction or loss of forehead facial animation due to forehead muscle weakness  -Eyelid drooping  -Dry eye  -Pain at the site of injection or bruising at the  site of injection  -Double vision  -Potential unknown long term risks  Contraindications: You should not have Botox if you are pregnant, nursing, allergic to albumin, have an infection, skin condition, or muscle weakness at the site of the injection, or have myasthenia gravis, Lambert-Eaton syndrome, or ALS.  It is also possible that as with any injection, there may be an allergic reaction or no effect from the medication. Reduced effectiveness after repeated injections is sometimes seen and rarely infection at the injection site may occur. All care will be taken to prevent these side effects. If therapy is given over a long time, atrophy and wasting in the muscle injected may occur. Occasionally the patient's become refractory to treatment because they develop antibodies to the toxin. In this event, therapy needs to be modified.  I have read the above information and consent to the administration of botulism toxin.    BOTOX PROCEDURE NOTE FOR MIGRAINE HEADACHE    Contraindications and precautions discussed with patient(above). Aseptic procedure was observed and patient tolerated procedure. Procedure performed by Dr. Georgia Dom  The condition has existed for more than 6 months, and pt does not have a diagnosis of ALS, Myasthenia Gravis or Lambert-Eaton Syndrome.  Risks and benefits of injections discussed and pt agrees to proceed with the procedure.  Written consent obtained  These injections are medically necessary. Pt  receives good benefits from these injections. These injections do not cause sedations or hallucinations which the oral therapies may cause.  Description of procedure:  The patient was placed in a sitting position. The standard protocol was used for Botox  as follows, with 5 units of Botox injected at each site:   -Procerus muscle, midline injection  -Corrugator muscle, bilateral injection  -Frontalis muscle, bilateral injection, with 2 sites each side, medial injection  was performed in the upper one third of the frontalis muscle, in the region vertical from the medial inferior edge of the superior orbital rim. The lateral injection was again in the upper one third of the forehead vertically above the lateral limbus of the cornea, 1.5 cm lateral to the medial injection site.  -Temporalis muscle injection, 5 sites, bilaterally. The first injection was 3 cm above the tragus of the ear, second injection site was 1.5 cm to 3 cm up from the first injection site in line with the tragus of the ear. The third injection site was 1.5-3 cm forward between the first 2 injection sites. The fourth injection site was 1.5 cm posterior to the second injection site. 5th site laterally in the temporalis  muscleat the level of the outer canthus.  - Patient feels her clenching is a trigger for headaches. +5 units masseter bilaterally   - Patient feels the migraines are centered around the eyes +5 units bilaterally at the outer canthus in the orbicularis occuli  -Occipitalis muscle injection, 3 sites, bilaterally. The first injection was done one half way between the occipital protuberance and the tip of the mastoid process behind the ear. The second injection site was done lateral and superior to the first, 1 fingerbreadth from the first injection. The third injection site was 1 fingerbreadth superiorly and medially from the first injection site.  -Cervical paraspinal muscle injection, 2 sites, bilateral knee first injection site was 1 cm from the midline of the cervical spine, 3 cm inferior to the lower border of the occipital protuberance. The second injection site was 1.5 cm superiorly and laterally to the first injection site.  -Trapezius muscle injection was performed at 3 sites, bilaterally. The first injection site was in the upper trapezius muscle halfway between the inflection point of the neck, and the acromion. The second injection site was one half way between the acromion and  the first injection site. The third injection was done between the first injection site and the inflection point of the neck.   Will return for repeat injection in 3 months.   A 200 unit sof Botox was used, any Botox not injected was wasted. The patient tolerated the procedure well, there were no complications of the above procedure.

## 2020-10-01 NOTE — Progress Notes (Signed)
Botox- 200 units x 1 vial Lot: [id number] Expiration: 04/2023 NDC: 8984-2103-12  Bacteriostatic 0.9% Sodium Chloride- 23mL total Lot: [id number] Expiration: 12/10/2021 NDC: 7737-3668-15  Dx: T47.076 B/B

## 2020-10-15 ENCOUNTER — Other Ambulatory Visit: Payer: Self-pay | Admitting: Family

## 2020-10-15 ENCOUNTER — Telehealth (INDEPENDENT_AMBULATORY_CARE_PROVIDER_SITE_OTHER): Payer: No Typology Code available for payment source | Admitting: Family

## 2020-10-15 ENCOUNTER — Encounter: Payer: Self-pay | Admitting: Family

## 2020-10-15 ENCOUNTER — Other Ambulatory Visit: Payer: Self-pay

## 2020-10-15 VITALS — Wt 216.0 lb

## 2020-10-15 DIAGNOSIS — F909 Attention-deficit hyperactivity disorder, unspecified type: Secondary | ICD-10-CM

## 2020-10-15 DIAGNOSIS — I10 Essential (primary) hypertension: Secondary | ICD-10-CM | POA: Diagnosis not present

## 2020-10-15 MED ORDER — AMPHETAMINE-DEXTROAMPHET ER 20 MG PO CP24: 20.0000 mg | ORAL_CAPSULE | ORAL | 0 refills | Status: DC

## 2020-10-15 MED FILL — ADDERALL XR 20 MG CAP SA: 20 | 30 days supply | Qty: 30 | Fill #0

## 2020-10-15 NOTE — Progress Notes (Signed)
Virtual Visit via Video Note  I connected with Kaitlin Foster on 10/15/20 at  8:00 AM EST verified that I am speaking with the correct person using two identifiers. The patient was unable to connect to the video platform so we transitioned to the telephone.   Location: Patient: parked car Provider: work   I discussed the limitations of evaluation and management by telemedicine and the availability of in person appointments. The patient expressed understanding and agreed to proceed. Only the patient and myself were present for today's video call.   History of Present Illness:  Patient is a 39 yr old female who presents today for follow up of her ADHD.  Last visit we gave her a trial of adderall 5mg  bid. She reports that the first few days it worked well but then not quite as good.  Notices some improvement on the adderall currently but not yet optimal.    Reports blood pressure has been mid to high 120's over low to mid 80's. She did not check her blood pressure today.     Observations/Objective:   Gen: Awake, alert, no acute distress Resp: Breathing is even and non-labored Psych: calm/pleasant demeanor Neuro: Alert and Oriented x 3, + facial symmetry, speech is clear.   Assessment and Plan:  HTN- report bp is stable. Continue Toprol xl 50mg  and amlodipine 5mg .   ADHD- slight improvement with adderall 5mg  bid.  Will increase/change to adderall xr 20mg  once daily. She will continue to monitor her blood pressure.    15 minutes spent on today's visit.  Follow Up Instructions:    I discussed the assessment and treatment plan with the patient. The patient was provided an opportunity to ask questions and all were answered. The patient agreed with the plan and demonstrated an understanding of the instructions.   The patient was advised to call back or seek an in-person evaluation if the symptoms worsen or if the condition fails to improve as anticipated.  Nance Pear, NP

## 2020-10-18 ENCOUNTER — Encounter: Payer: Self-pay | Admitting: Family

## 2020-11-15 ENCOUNTER — Encounter: Payer: Self-pay | Admitting: Family

## 2020-11-15 ENCOUNTER — Other Ambulatory Visit: Payer: Self-pay | Admitting: Family

## 2020-11-15 MED ORDER — AMPHETAMINE-DEXTROAMPHET ER 20 MG PO CP24: 20.0000 mg | ORAL_CAPSULE | ORAL | 0 refills | Status: DC

## 2020-11-15 MED FILL — ADDERALL XR 20 MG CAP SA: 20 | 30 days supply | Qty: 30 | Fill #0

## 2020-11-15 NOTE — Telephone Encounter (Signed)
Requesting: Adderall XR 20mg  Contract: 09/17/2020 UDS: 09/17/2020 Last Visit: 10/15/2020 Next Visit: None Last Refill: 10/15/2020 #30 and 0RF  Please Advise

## 2020-11-16 ENCOUNTER — Other Ambulatory Visit: Payer: Self-pay | Admitting: Family

## 2020-11-16 MED FILL — LEVOCETIRIZINE 5 MG TABLET: 5 | 90 days supply | Qty: 90 | Fill #2

## 2020-11-18 ENCOUNTER — Other Ambulatory Visit: Payer: Self-pay | Admitting: Family

## 2020-11-18 MED FILL — AMLODIPINE BESYLATE 5 MG TA: 5 | 90 days supply | Qty: 90 | Fill #0

## 2020-11-20 ENCOUNTER — Other Ambulatory Visit: Payer: Self-pay | Admitting: Family

## 2020-11-21 ENCOUNTER — Other Ambulatory Visit: Payer: Self-pay | Admitting: Family

## 2020-12-11 MED FILL — METOPROLOL SUCCINATE ER 50: 50 | 90 days supply | Qty: 90 | Fill #0

## 2020-12-25 ENCOUNTER — Other Ambulatory Visit: Payer: Self-pay | Admitting: Family

## 2020-12-25 ENCOUNTER — Encounter: Payer: Self-pay | Admitting: Family

## 2020-12-25 MED ORDER — AMPHETAMINE-DEXTROAMPHET ER 30 MG PO CP24: 30.0000 mg | ORAL_CAPSULE | ORAL | 0 refills | Status: DC

## 2020-12-25 MED FILL — ADDERALL XR 30 MG CAP SA: 30 | 30 days supply | Qty: 30 | Fill #0

## 2020-12-31 NOTE — Progress Notes (Signed)
Consent Form Botulism Toxin Injection For Chronic Migraine  Interval history 01/01/2022: Baseline daily headaches and16 migraine days. Since last botox she has had only 3 total very mild migraines a month and acute management works, nurtec works with zofran, she had side effects to zembrace, also [place] works, takes with zofran. More than 25 free headache days a month, exceptional improvement!+all.  She is very happy. She is on adderall now but not causing headache.   Orders: [facility] or [place] for dry needling for cervical and lumbar/lumbosacral dry needling for myofascial disease in the past  No orders of the defined types were placed in this encounter.  No orders of the defined types were placed in this encounter.   Reviewed orally with patient, additionally signature is on file:  Botulism toxin has been approved by the Federal drug administration for treatment of chronic migraine. Botulism toxin does not cure chronic migraine and it may not be effective in some patients.  The administration of botulism toxin is accomplished by injecting a small amount of toxin into the muscles of the neck and head. Dosage must be titrated for each individual. Any benefits resulting from botulism toxin tend to wear off after 3 months with a repeat injection required if benefit is to be maintained. Injections are usually done every 3-4 months with maximum effect peak achieved by about 2 or 3 weeks. Botulism toxin is expensive and you should be sure of what costs you will incur resulting from the injection.  The side effects of botulism toxin use for chronic migraine may include:   -Transient, and usually mild, facial weakness with facial injections  -Transient, and usually mild, head or neck weakness with head/neck injections  -Reduction or loss of forehead facial animation due to forehead muscle weakness  -Eyelid drooping  -Dry eye  -Pain at the site of injection or bruising at the  site of injection  -Double vision  -Potential unknown long term risks  Contraindications: You should not have Botox if you are pregnant, nursing, allergic to albumin, have an infection, skin condition, or muscle weakness at the site of the injection, or have myasthenia gravis, Lambert-Eaton syndrome, or ALS.  It is also possible that as with any injection, there may be an allergic reaction or no effect from the medication. Reduced effectiveness after repeated injections is sometimes seen and rarely infection at the injection site may occur. All care will be taken to prevent these side effects. If therapy is given over a long time, atrophy and wasting in the muscle injected may occur. Occasionally the patient's become refractory to treatment because they develop antibodies to the toxin. In this event, therapy needs to be modified.  I have read the above information and consent to the administration of botulism toxin.    BOTOX PROCEDURE NOTE FOR MIGRAINE HEADACHE    Contraindications and precautions discussed with patient(above). Aseptic procedure was observed and patient tolerated procedure. Procedure performed by Dr. Georgia Dom  The condition has existed for more than 6 months, and pt does not have a diagnosis of ALS, Myasthenia Gravis or Lambert-Eaton Syndrome.  Risks and benefits of injections discussed and pt agrees to proceed with the procedure.  Written consent obtained  These injections are medically necessary. Pt  receives good benefits from these injections. These injections do not cause sedations or hallucinations which the oral therapies may cause.  Description of procedure:  The patient was placed in a sitting position. The standard protocol was used for Botox  as follows, with 5 units of Botox injected at each site:   -Procerus muscle, midline injection  -Corrugator muscle, bilateral injection  -Frontalis muscle, bilateral injection, with 2 sites each side, medial injection  was performed in the upper one third of the frontalis muscle, in the region vertical from the medial inferior edge of the superior orbital rim. The lateral injection was again in the upper one third of the forehead vertically above the lateral limbus of the cornea, 1.5 cm lateral to the medial injection site.  -Temporalis muscle injection, 5 sites, bilaterally. The first injection was 3 cm above the tragus of the ear, second injection site was 1.5 cm to 3 cm up from the first injection site in line with the tragus of the ear. The third injection site was 1.5-3 cm forward between the first 2 injection sites. The fourth injection site was 1.5 cm posterior to the second injection site. 5th site laterally in the temporalis  muscleat the level of the outer canthus.  - Patient feels her clenching is a trigger for headaches. +5 units masseter bilaterally   - Patient feels the migraines are centered around the eyes +5 units bilaterally at the outer canthus in the orbicularis occuli  -Occipitalis muscle injection, 3 sites, bilaterally. The first injection was done one half way between the occipital protuberance and the tip of the mastoid process behind the ear. The second injection site was done lateral and superior to the first, 1 fingerbreadth from the first injection. The third injection site was 1 fingerbreadth superiorly and medially from the first injection site.  -Cervical paraspinal muscle injection, 2 sites, bilateral knee first injection site was 1 cm from the midline of the cervical spine, 3 cm inferior to the lower border of the occipital protuberance. The second injection site was 1.5 cm superiorly and laterally to the first injection site.  -Trapezius muscle injection was performed at 3 sites, bilaterally. The first injection site was in the upper trapezius muscle halfway between the inflection point of the neck, and the acromion. The second injection site was one half way between the acromion and  the first injection site. The third injection was done between the first injection site and the inflection point of the neck.   Will return for repeat injection in 3 months.   A 200 unit sof Botox was used, any Botox not injected was wasted. The patient tolerated the procedure well, there were no complications of the above procedure.

## 2021-01-01 ENCOUNTER — Ambulatory Visit (INDEPENDENT_AMBULATORY_CARE_PROVIDER_SITE_OTHER): Payer: No Typology Code available for payment source | Admitting: Neurology

## 2021-01-01 DIAGNOSIS — G43711 Chronic migraine without aura, intractable, with status migrainosus: Secondary | ICD-10-CM

## 2021-01-01 NOTE — Progress Notes (Signed)
Botox- 200 units x 1 vial Lot: [id number] Expiration: 09/2023 NDC: 0023-3921-02  Bacteriostatic 0.9% Sodium Chloride- 4mL total Lot: [id number] Expiration: 12/10/2021 NDC: 0409-1966-02  Dx: G43.711 B/B  

## 2021-01-13 ENCOUNTER — Other Ambulatory Visit: Payer: Self-pay

## 2021-01-13 ENCOUNTER — Ambulatory Visit (INDEPENDENT_AMBULATORY_CARE_PROVIDER_SITE_OTHER): Payer: No Typology Code available for payment source | Admitting: Family

## 2021-01-13 ENCOUNTER — Other Ambulatory Visit: Payer: Self-pay | Admitting: Family

## 2021-01-13 ENCOUNTER — Encounter: Payer: Self-pay | Admitting: Family

## 2021-01-13 VITALS — BP 134/89 | HR 85 | Temp 98.3°F | Resp 16 | Ht 64.0 in | Wt 202.0 lb

## 2021-01-13 DIAGNOSIS — R6889 Other general symptoms and signs: Secondary | ICD-10-CM | POA: Diagnosis not present

## 2021-01-13 DIAGNOSIS — I10 Essential (primary) hypertension: Secondary | ICD-10-CM | POA: Diagnosis not present

## 2021-01-13 DIAGNOSIS — F909 Attention-deficit hyperactivity disorder, unspecified type: Secondary | ICD-10-CM | POA: Diagnosis not present

## 2021-01-13 MED ORDER — AMPHETAMINE-DEXTROAMPHET ER 30 MG PO CP24: 30.0000 mg | ORAL_CAPSULE | ORAL | 0 refills | Status: DC

## 2021-01-13 NOTE — Progress Notes (Signed)
Subjective:    Patient ID: Kaitlin Foster, female    DOB: 11/16/1980, 40 y.o.   MRN: [id number]  HPI  Patient is a 40 yr old female who presents today with c/o "neck lump" and sore area on her scalp. Reports that it has been present for a few months.  She reports that she had some soreness on the right anterior scalp.   Wt Readings from Last 3 Encounters:  01/13/21 202 lb (91.6 kg)  10/15/20 216 lb (98 kg)  09/17/20 216 lb (98 kg)    ADHD- on Adderall XR 30mg  and this is helping more than the 20mg  XR dose.   HTN- maintained on toprol xl 50mg  and amlodipine 5mg .   BP Readings from Last 3 Encounters:  01/13/21 134/89  09/17/20 115/71  09/10/20 (!) 150/100    Review of Systems See HPI  Past Medical History:  Diagnosis Date  . 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 Anxiety   . Chronic migraine    neurologist-  dr Jaynee Eagles--- botox injecitons every 3 months  . GERD (gastroesophageal reflux disease)   . 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 Hypertension   . Left ureteral stone   . Migraines   . Nephrolithiasis    left renal nonobstructive stone per CT 06-14-2019  . Uterine fibroid      Social History   Socioeconomic History  . Marital status: Married    Spouse name: Thad  . Number of children: 1  . Years of education: Not on file  . Highest education level: Associate degree: academic program  Occupational History  . Occupation: Therapist, sports  Tobacco Use  . Smoking status: Never Smoker  . Smokeless tobacco: Never Used  Vaping Use  . Vaping Use: Never used  Substance and Sexual Activity  . Alcohol use: No    Alcohol/week: 0.0 standard drinks  .  Drug use: No  . Sexual activity: Yes    Birth control/protection: Condom  Other Topics Concern  . 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 CEREBRAL EMBOLIZATION  Sept 11th and 18th, 2008   @[facility]    2 staged embolization left ACA supply to AVM  . CESAREAN SECTION  07/06/2009  . CRANIOTOMY  07-29-2007   @ [facility]   resection left frontal AVM    Family History  Problem Relation Age of Onset  . Hypertension Father   . Alcohol abuse Father   . Arthritis Father   . Obesity Father   . Hypertension Mother   . Arthritis  Mother   . Migraines Mother   . Obesity Mother   . Cancer Mother        sinus tumor  . Migraines Sister   . Ulcerative colitis Sister   . Migraines Maternal Aunt   . ADD / ADHD Son   . Cancer Maternal Grandmother        colon diagnosed at age 4  . Dementia Maternal Grandmother   . Heart disease Neg Hx     Allergies  Allergen Reactions  . Codeine Other (See Comments)    REACTION: upset stomach once, but has taken it other times and been ok  . Lisinopril Other (See Comments)    cough  . Sulfa Antibiotics Hives    Current Outpatient Medications on File Prior to Visit  Medication Sig Dispense Refill  . amLODipine (NORVASC) 5 MG tablet Take 1 tablet (5 mg total) by mouth daily. 90 tablet 1  . amphetamine-dextroamphetamine (ADDERALL XR) 30 MG 24 hr capsule Take 1 capsule (30 mg total) by mouth every morning. 30 capsule 0  . famotidine (PEPCID) 20 MG  tablet Take 20 mg by mouth daily as needed for heartburn or indigestion.     . Ferrous Sulfate (IRON) 325 (65 Fe) MG TABS Take 1 tablet (325 mg total) by mouth daily. 30 tablet 0  . fluticasone (FLONASE) 50 MCG/ACT nasal spray Place into both nostrils as needed for allergies or rhinitis.     [name] levocetirizine (XYZAL) 5 MG tablet Take 1 tablet (5 mg total) by mouth every evening. 90 tablet 3  . metoprolol succinate (TOPROL-XL) 50 MG 24 hr tablet TAKE 1 TABLET (50 MG TOTAL) BY MOUTH DAILY. TAKE WITH OR IMMEDIATELY FOLLOWING A MEAL. 90 tablet 0  . NURTEC 75 MG TBDP TAKE 1 TABLET (75 MG) BY MOUTH DAILY AS NEEDED FOR MIGRAINES. TAKE AS CLOSE TO ONSET OF MIGRAINE AS POSSIBLE. ONE DAILY MAXIMUM. 8 tablet 6  . ondansetron (ZOFRAN-ODT) 4 MG disintegrating tablet DISSOLVE 1 TABLET (4 MG TOTAL) BY MOUTH EVERY 8 (EIGHT) HOURS AS NEEDED FOR NAUSEA. 30 tablet 3  . rizatriptan (MAXALT-MLT) 10 MG disintegrating tablet Take 1 tablet (10 mg total) by mouth as needed for migraine. May repeat in 2 hours if needed 9 tablet 11   No current facility-administered medications on file prior to visit.    BP 134/89 (BP Location: Right Arm, Patient Position: Sitting, Cuff Size: Small)   Pulse 85   Temp 98.3 F (36.8 C) (Oral)   Resp 16   Ht 5\' 4"  (1.626 m)   Wt 202 lb (91.6 kg)   SpO2 100%   BMI 34.67 kg/m       Objective:   Physical Exam Constitutional:      Appearance: Normal appearance.  HENT:     Head: Normocephalic and atraumatic.     Comments: No scalp lesions noted Musculoskeletal:     Comments: Some tightness of the right sternocleidomastoid muscle on right, no mass  Lymphadenopathy:     Head:     Right side of head: No submental adenopathy.     Left side of head: No submental adenopathy.     Cervical: No cervical adenopathy.     Right cervical: No superficial, deep or posterior cervical adenopathy.    Left cervical: No superficial, deep or posterior cervical adenopathy.  Neurological:     Mental  Status: She is alert.           Assessment & Plan:  ADHD- improved on adderall xr  30mg  once daily. Continue same. UDS/Contractu up to date. Refill sent.  HTN- bp stable. Continue toprol xl 50mg  and amlodipine 5mg .   Neck problem- Reassurance provided that what she is feeling is a prominent/tight muscle. Not an enlarged lymph node or mass.   This visit occurred during the SARS-CoV-2 public health emergency.  Safety protocols were in place, including screening questions prior to the visit, additional usage of staff PPE, and extensive cleaning of exam room while observing appropriate contact time as indicated for disinfecting solutions.

## 2021-01-18 ENCOUNTER — Encounter: Payer: Self-pay | Admitting: Family

## 2021-01-22 ENCOUNTER — Other Ambulatory Visit: Payer: Self-pay

## 2021-01-22 ENCOUNTER — Ambulatory Visit (INDEPENDENT_AMBULATORY_CARE_PROVIDER_SITE_OTHER): Payer: No Typology Code available for payment source | Admitting: Family

## 2021-01-22 ENCOUNTER — Ambulatory Visit: Payer: No Typology Code available for payment source | Admitting: Family

## 2021-01-22 VITALS — BP 118/71 | HR 74 | Temp 98.6°F | Resp 16 | Ht 64.0 in | Wt 203.8 lb

## 2021-01-22 DIAGNOSIS — I10 Essential (primary) hypertension: Secondary | ICD-10-CM

## 2021-01-22 DIAGNOSIS — F900 Attention-deficit hyperactivity disorder, predominantly inattentive type: Secondary | ICD-10-CM

## 2021-01-22 DIAGNOSIS — R002 Palpitations: Secondary | ICD-10-CM | POA: Diagnosis not present

## 2021-01-22 LAB — CBC WITH DIFFERENTIAL/PLATELET
Basophils Absolute: 0.1 10*3/uL (ref 0.0–0.1)
Basophils Relative: 0.8 % (ref 0.0–3.0)
Eosinophils Absolute: 0.2 10*3/uL (ref 0.0–0.7)
Eosinophils Relative: 2.3 % (ref 0.0–5.0)
HCT: 43.5 % (ref 36.0–46.0)
Hemoglobin: 14.4 g/dL (ref 12.0–15.0)
Lymphocytes Relative: 21.9 % (ref 12.0–46.0)
Lymphs Abs: 1.5 10*3/uL (ref 0.7–4.0)
MCHC: 33 g/dL (ref 30.0–36.0)
MCV: 86.5 fl (ref 78.0–100.0)
Monocytes Absolute: 0.6 10*3/uL (ref 0.1–1.0)
Monocytes Relative: 8.6 % (ref 3.0–12.0)
Neutro Abs: 4.5 10*3/uL (ref 1.4–7.7)
Neutrophils Relative %: 66.4 % (ref 43.0–77.0)
Platelets: 261 10*3/uL (ref 150.0–400.0)
RBC: 5.03 Mil/uL (ref 3.87–5.11)
RDW: 14 % (ref 11.5–15.5)
WBC: 6.8 10*3/uL (ref 4.0–10.5)

## 2021-01-22 LAB — BASIC METABOLIC PANEL
BUN: 16 mg/dL (ref 6–23)
CO2: 29 mEq/L (ref 19–32)
Calcium: 9.4 mg/dL (ref 8.4–10.5)
Chloride: 104 mEq/L (ref 96–112)
Creatinine, Ser: 0.8 mg/dL (ref 0.40–1.20)
GFR: 93.57 mL/min (ref >60.00)
Glucose, Bld: 93 mg/dL (ref 70–99)
Potassium: 3.8 mEq/L (ref 3.5–5.1)
Sodium: 138 mEq/L (ref 135–145)

## 2021-01-22 LAB — TSH: TSH: 1.18 u[IU]/mL (ref 0.35–4.50)

## 2021-01-22 NOTE — Patient Instructions (Signed)
Please complete lab work prior to leaving.  You should be contacted about scheduling your appointment with cardiology.

## 2021-01-22 NOTE — Progress Notes (Signed)
Subjective:    Patient ID: Kaitlin Foster, female    DOB: 09/02/1981, 40 y.o.   MRN: [id number]   Palpitations  Pertinent negatives include no chest pain, coughing, dizziness, fever, nausea, shortness of breath or vomiting.  She complains of having palpitations in the past. She reports drinking caffeine regularly, however she stopped drinking it 1 week ago. She states that the episodes occurs 15 times a day and last for 30 seconds. She also endorses having fatigue but she relates this to knowing that heart is racing. She stopped taking her Adderall to control the symptoms, but she feels like it has worsened. She has seen a cardiologist in the past; no abnormal results her detected.   ADHD- reports she is managing at work off of [place].   HTN- compliant with BP regimen.  Review of Systems  Constitutional: Positive for fatigue. Negative for chills and fever.  HENT: Negative for ear pain, rhinorrhea, sinus pressure and sore throat.   Eyes: Negative for pain.  Respiratory: Negative for cough and shortness of breath.   Cardiovascular: Positive for palpitations. Negative for chest pain.  Gastrointestinal: Negative for abdominal pain, diarrhea, nausea and vomiting.  Genitourinary: Negative for dysuria.  Musculoskeletal: Negative for back pain and neck pain.  Neurological: Negative for dizziness and headaches.   Past Medical History:  Diagnosis Date  . 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 Anxiety   . Chronic migraine    neurologist-  dr Jaynee Eagles--- botox injecitons every 3 months  . GERD (gastroesophageal reflux disease)   . 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 Hypertension   . Left ureteral stone   . Migraines   . Nephrolithiasis    left renal nonobstructive stone per CT 06-14-2019  . Uterine fibroid      Social History   Socioeconomic History  . Marital status: Married    Spouse name: Thad  . Number of children: 1  . Years of education: Not on file  . Highest education level: Associate degree: academic program  Occupational History  . Occupation: Therapist, sports  Tobacco Use  . Smoking status: Never Smoker  . Smokeless tobacco: Never Used  Vaping Use  . Vaping Use: Never used  Substance and Sexual Activity  . Alcohol use: No    Alcohol/week: 0.0 standard drinks  . Drug use: No  . Sexual activity: Yes    Birth control/protection: Condom  Other Topics Concern  . 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 CEREBRAL EMBOLIZATION  Sept 11th and 18th,  2008   @[facility]    2 staged embolization left ACA supply to AVM  . CESAREAN SECTION  07/06/2009  . CRANIOTOMY  07-29-2007   @ [facility]   resection left frontal AVM    Family History  Problem Relation Age of Onset  . Hypertension Father   . Alcohol abuse Father   . Arthritis Father   . Obesity Father   . Hypertension Mother   . Arthritis Mother   . Migraines Mother   . Obesity Mother   . Cancer Mother        sinus tumor  . Migraines Sister   . Ulcerative colitis Sister   . Migraines Maternal Aunt   . ADD / ADHD Son   . Cancer Maternal Grandmother        colon diagnosed at  age 53  . Dementia Maternal Grandmother   . Heart disease Neg Hx     Allergies  Allergen Reactions  . Codeine Other (See Comments)    REACTION: upset stomach once, but has taken it other times and been ok  . Lisinopril Other (See Comments)    cough  . Sulfa Antibiotics Hives    Current Outpatient Medications on File Prior to Visit  Medication Sig Dispense Refill  . amLODipine (NORVASC) 5 MG tablet Take 1 tablet (5 mg total) by mouth daily. 90 tablet 1  . amphetamine-dextroamphetamine (ADDERALL XR) 30 MG 24 hr capsule Take 1 capsule (30 mg total) by mouth every morning. 30 capsule 0  . famotidine (PEPCID) 20 MG tablet Take 20 mg by mouth daily as needed for heartburn or indigestion.     . Ferrous Sulfate (IRON) 325 (65 Fe) MG TABS Take 1 tablet (325 mg total) by mouth daily. 30 tablet 0  . fluticasone (FLONASE) 50 MCG/ACT nasal spray Place into both nostrils as needed for allergies or rhinitis.     [name] levocetirizine (XYZAL) 5 MG tablet Take 1 tablet (5 mg total) by mouth every evening. 90 tablet 3  . metoprolol succinate (TOPROL-XL) 50 MG 24 hr tablet TAKE 1 TABLET (50 MG TOTAL) BY MOUTH DAILY. TAKE WITH OR IMMEDIATELY FOLLOWING A MEAL. 90 tablet 0  . NURTEC 75 MG TBDP TAKE 1 TABLET (75 MG) BY MOUTH DAILY AS NEEDED FOR MIGRAINES. TAKE AS CLOSE TO ONSET OF MIGRAINE AS POSSIBLE. ONE DAILY MAXIMUM. 8 tablet 6  . ondansetron (ZOFRAN-ODT) 4 MG disintegrating tablet DISSOLVE 1 TABLET (4 MG TOTAL) BY MOUTH EVERY 8 (EIGHT) HOURS AS NEEDED FOR NAUSEA. 30 tablet 3  . rizatriptan (MAXALT-MLT) 10 MG disintegrating tablet Take 1 tablet (10 mg total) by mouth as needed for migraine. May repeat in 2 hours if needed 9 tablet 11   No current facility-administered medications on file prior to visit.    There were no vitals taken for this visit.       Objective:   Physical Exam Constitutional:      General: She is not in acute distress.    Appearance: Normal appearance. She is not ill-appearing.   HENT:     Head: Normocephalic and atraumatic.     Right Ear: External ear normal.     Left Ear: External ear normal.  Neck:     Comments: No JVD Cardiovascular:     Rate and Rhythm: Normal rate and regular rhythm.     Pulses: Normal pulses.     Heart sounds: Normal heart sounds. No murmur heard.   Pulmonary:     Effort: No respiratory distress.  Breath sounds: Normal breath sounds. No wheezing or rales.  Musculoskeletal:     Cervical back: Normal range of motion. No rigidity or tenderness.  Skin:    General: Skin is warm and dry.     Coloration: Skin is not jaundiced.  Neurological:     Mental Status: She is oriented to person, place, and time.        Assessment & Plan:  Palpitations- new. Obtain TSH, CBC CMET, Refer to Cardiology for further evaluation   EKG tracing is personally reviewed.  EKG notes NSR.  No acute changes.   ADHD- Patient plan to remain off of Adderall for now.   HTN- bp is stable on amlodipine 5mg  and toprol xl 50mg .   I,Alexis Bryant,acting as a Education administrator for [employer], NP.,have documented all relevant documentation on the behalf of Nance Pear, NP,as directed by  Nance Pear, NP while in the presence of Nance Pear, NP.  I, Nance Pear, NP, have reviewed all documentation for this visit. The documentation on 01/22/21 for the exam, diagnosis, procedures, and orders are all accurate and complete.

## 2021-02-12 ENCOUNTER — Other Ambulatory Visit: Payer: Self-pay | Admitting: Family

## 2021-02-12 MED ORDER — METOPROLOL SUCCINATE ER 50 MG PO TB24
50.0000 mg | ORAL_TABLET | Freq: Every day | ORAL | 0 refills | Status: DC
  Filled 2021-02-19 – 2021-03-26 (×2): qty 90, 90d supply, fill #0

## 2021-02-17 DIAGNOSIS — F419 Anxiety disorder, unspecified: Secondary | ICD-10-CM | POA: Insufficient documentation

## 2021-02-17 DIAGNOSIS — N200 Calculus of kidney: Secondary | ICD-10-CM | POA: Insufficient documentation

## 2021-02-17 DIAGNOSIS — D259 Leiomyoma of uterus, unspecified: Secondary | ICD-10-CM | POA: Insufficient documentation

## 2021-02-17 DIAGNOSIS — G43909 Migraine, unspecified, not intractable, without status migrainosus: Secondary | ICD-10-CM | POA: Insufficient documentation

## 2021-02-17 DIAGNOSIS — I10 Essential (primary) hypertension: Secondary | ICD-10-CM | POA: Insufficient documentation

## 2021-02-17 DIAGNOSIS — N201 Calculus of ureter: Secondary | ICD-10-CM | POA: Insufficient documentation

## 2021-02-17 DIAGNOSIS — Z8774 Personal history of (corrected) congenital malformations of heart and circulatory system: Secondary | ICD-10-CM | POA: Insufficient documentation

## 2021-02-17 DIAGNOSIS — K219 Gastro-esophageal reflux disease without esophagitis: Secondary | ICD-10-CM | POA: Insufficient documentation

## 2021-02-19 ENCOUNTER — Ambulatory Visit: Payer: No Typology Code available for payment source | Admitting: Cardiology

## 2021-02-19 ENCOUNTER — Ambulatory Visit (INDEPENDENT_AMBULATORY_CARE_PROVIDER_SITE_OTHER): Payer: No Typology Code available for payment source

## 2021-02-19 ENCOUNTER — Other Ambulatory Visit: Payer: Self-pay

## 2021-02-19 ENCOUNTER — Other Ambulatory Visit (HOSPITAL_BASED_OUTPATIENT_CLINIC_OR_DEPARTMENT_OTHER): Payer: Self-pay

## 2021-02-19 ENCOUNTER — Encounter: Payer: Self-pay | Admitting: Cardiology

## 2021-02-19 VITALS — BP 130/86 | HR 69 | Ht 64.0 in | Wt 205.0 lb

## 2021-02-19 DIAGNOSIS — E669 Obesity, unspecified: Secondary | ICD-10-CM | POA: Diagnosis not present

## 2021-02-19 DIAGNOSIS — R002 Palpitations: Secondary | ICD-10-CM | POA: Insufficient documentation

## 2021-02-19 DIAGNOSIS — R5383 Other fatigue: Secondary | ICD-10-CM | POA: Insufficient documentation

## 2021-02-19 MED FILL — Rimegepant Sulfate Tab Disint 75 MG: ORAL | 30 days supply | Qty: 8 | Fill #0 | Status: AC

## 2021-02-19 MED FILL — Levocetirizine Dihydrochloride Tab 5 MG: ORAL | 90 days supply | Qty: 90 | Fill #0 | Status: AC

## 2021-02-19 MED FILL — Amlodipine Besylate Tab 5 MG (Base Equivalent): ORAL | 90 days supply | Qty: 90 | Fill #0 | Status: AC

## 2021-02-19 NOTE — Progress Notes (Signed)
Cardiology Office Note:    Date:  02/19/2021   ID:  Kaitlin Foster, DOB 07-Jan-1981, MRN [id number]  PCP:  [name], NP  Cardiologist:  Berniece Salines, DO  Electrophysiologist:  None   Referring MD: [name], NP   I have been experiencing palpitations  History of Present Illness:    Kaitlin Foster is a 40 y.o. female with a hx of hypertension, obesity is here today to be evaluated for palpitations.  Patient tells me that she had for the last few days getting something palpitations initially not frequent.  Recently she noticed more of a fluttering sensation.  Last long but when she checked her apple letter she is noticing frequent PVCs.  Denies any chest pain, shortness of breath.  Does have some fatigue.   Past Medical History:  Diagnosis Date  . 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 Anxiety   . Chronic migraine    neurologist-  dr Jaynee Eagles--- botox injecitons every 3 months  . GERD (gastroesophageal reflux disease)   . 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 Hypertension   . Left ureteral stone   . Migraines   . Nephrolithiasis    left renal nonobstructive stone per CT 06-14-2019  . Uterine fibroid     Past Surgical History:  Procedure Laterality Date  . CEREBRAL EMBOLIZATION  Sept 11th and 18th, 2008   @[facility]    2 staged embolization left ACA supply to AVM  . CESAREAN SECTION  07/06/2009  . CRANIOTOMY  07-29-2007   @ [facility]   resection left frontal AVM    Current Medications: Current Meds  Medication Sig  . amLODipine (NORVASC) 5 MG tablet TAKE 1 TABLET BY MOUTH ONCE DAILY  . famotidine (PEPCID) 20 MG tablet Take 20 mg by mouth  daily as needed for heartburn or indigestion.   . Ferrous Sulfate (IRON) 325 (65 Fe) MG TABS Take 1 tablet (325 mg total) by mouth daily.  . fluticasone (FLONASE) 50 MCG/ACT nasal spray Place into both nostrils as needed for allergies or rhinitis.   [name] levocetirizine (XYZAL) 5 MG tablet TAKE 1 TABLET (5 MG TOTAL) BY MOUTH EVERY EVENING.  . metoprolol succinate (TOPROL-XL) 50 MG 24 hr tablet Take 1 tablet (50 mg total) by mouth daily. Take with or immediately following a meal.  . ondansetron (ZOFRAN-ODT) 4 MG disintegrating tablet DISSOLVE 1 TABLET (4 MG TOTAL) BY MOUTH EVERY 8 (EIGHT) HOURS AS NEEDED FOR NAUSEA.  . Rimegepant Sulfate 75 MG TBDP TAKE 1 TABLET (75 MG) BY MOUTH DAILY AS NEEDED FOR MIGRAINES. TAKE AS CLOSE TO ONSET OF MIGRAINE AS POSSIBLE. ONE DAILY MAXIMUM.  . rizatriptan (MAXALT-MLT) 10 MG disintegrating tablet Take 1 tablet (10 mg total) by mouth as needed for migraine. May repeat in 2 hours if needed     Allergies:   Codeine, Lisinopril, and Sulfa antibiotics   Social History   Socioeconomic History  . Marital status: Married    Spouse name: Thad  . Number of children: 1  . Years of education: Not on file  . Highest education level: Associate degree: academic program  Occupational History  . Occupation: Therapist, sports  Tobacco Use  .  Smoking status: Never Smoker  . Smokeless tobacco: Never Used  Vaping Use  . Vaping Use: Never used  Substance and Sexual Activity  . Alcohol use: No    Alcohol/week: 0.0 standard drinks  . Drug use: No  . Sexual activity: Yes    Birth control/protection: Condom  Other Topics Concern  . 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)   Married   Lives at home with her husband and child      Social Determinants of Radio broadcast assistant Strain: Not on file  Food Insecurity: Not on file  Transportation  Needs: Not on file  Physical Activity: Not on file  Stress: Not on file  Social Connections: Not on file     Family History: The patient's family history includes ADD / ADHD in her son; Alcohol abuse in her father; Arthritis in her father and mother; Cancer in her maternal grandmother and mother; Dementia in her maternal grandmother; Hypertension in her father and mother; Migraines in her maternal aunt, mother, and sister; Obesity in her father and mother; Ulcerative colitis in her sister. There is no history of Heart disease.  ROS:   Review of Systems  Constitution: Negative for decreased appetite, fever and weight gain.  HENT: Negative for congestion, ear discharge, hoarse voice and sore throat.   Eyes: Negative for discharge, redness, vision loss in right eye and visual halos.  Cardiovascular: Negative for chest pain, dyspnea on exertion, leg swelling, orthopnea and palpitations.  Respiratory: Negative for cough, hemoptysis, shortness of breath and snoring.   Endocrine: Negative for heat intolerance and polyphagia.  Hematologic/Lymphatic: Negative for bleeding problem. Does not bruise/bleed easily.  Skin: Negative for flushing, nail changes, rash and suspicious lesions.  Musculoskeletal: Negative for arthritis, joint pain, muscle cramps, myalgias, neck pain and stiffness.  Gastrointestinal: Negative for abdominal pain, bowel incontinence, diarrhea and excessive appetite.  Genitourinary: Negative for decreased libido, genital sores and incomplete emptying.  Neurological: Negative for brief paralysis, focal weakness, headaches and loss of balance.  Psychiatric/Behavioral: Negative for altered mental status, depression and suicidal ideas.  Allergic/Immunologic: Negative for HIV exposure and persistent infections.    EKGs/Labs/Other Studies Reviewed:    The following studies were reviewed today:   EKG:  The ekg ordered today demonstrates sinus rhythm, heart rate is 69 bpm poor  progression.  Recent Labs: 08/28/2020: ALT 15 01/22/2021: BUN 16; Creatinine, Ser 0.80; Hemoglobin 14.4; Platelets 261.0; Potassium 3.8; Sodium 138; TSH 1.18  Recent Lipid Panel    Component Value Date/Time   CHOL 151 08/22/2019 1414   TRIG 125.0 08/22/2019 1414   HDL 27.50 (L) 08/22/2019 1414   CHOLHDL 5 08/22/2019 1414   VLDL 25.0 08/22/2019 1414   LDLCALC 99 08/22/2019 1414    Physical Exam:    VS:  BP 130/86 (BP Location: Right Arm)   Pulse 69   Ht 5\' 4"  (1.626 m)   Wt 205 lb (93 kg)   SpO2 96%   BMI 35.19 kg/m     Wt Readings from Last 3 Encounters:  02/19/21 205 lb (93 kg)  01/22/21 203 lb 12.8 oz (92.4 kg)  01/13/21 202 lb (91.6 kg)     GEN: Well nourished, well developed in no acute distress HEENT: Normal NECK: No JVD; No carotid bruits LYMPHATICS: No lymphadenopathy CARDIAC: S1S2 noted,RRR,  no murmurs, rubs, gallops RESPIRATORY:  Clear to auscultation without rales, wheezing or rhonchi  ABDOMEN: Soft, non-tender, non-distended, +bowel sounds, no guarding. EXTREMITIES: No edema, No cyanosis, no clubbing MUSCULOSKELETAL:  No deformity  SKIN: Warm and dry NEUROLOGIC:  Alert and oriented x 3, non-focal PSYCHIATRIC:  Normal affect, good insight  ASSESSMENT:    1. Palpitations   2. Fatigue, unspecified type   3. Obesity (BMI 30-39.9)    PLAN:    I would like to rule out a cardiovascular etiology of this palpitation, therefore at this time I would like to placed a zio patch for 14 days. She has some fatigue, therefore like to rule out vitamin D deficiency as a factor. Her blood pressure is acceptable in the office today.  She will keep her on her amlodipine 5 mg as well as metoprolol succinate 50 mg for now.  The patient understands the need to lose weight with diet and exercise. We have discussed specific strategies for this.   The patient is in agreement with the above plan. The patient left the office in stable condition.  The patient will follow up in  3 months at which time video visit can be okay.   Medication Adjustments/Labs and Tests Ordered: Current medicines are reviewed at length with the patient today.  Concerns regarding medicines are outlined above.  Orders Placed This Encounter  Procedures  . VITAMIN D 25 Hydroxy (Vit-D Deficiency, Fractures)  . LONG TERM MONITOR (3-14 DAYS)  . EKG 12-Lead   No orders of the defined types were placed in this encounter.   Patient Instructions  Medication Instructions:  Your physician recommends that you continue on your current medications as directed. Please refer to the Current Medication list given to you today.  *If you need a refill on your cardiac medications before your next appointment, please call your pharmacy*   Lab Work: Your physician recommends that you return for lab work: TODAY: Vitamin D If you have labs (blood work) drawn today and your tests are completely normal, you will receive your results only by: [name] MyChart Message (if you have MyChart) OR . A paper copy in the mail If you have any lab test that is abnormal or we need to change your treatment, we will call you to review the results.   Testing/Procedures: A zio monitor was ordered today. It will remain on for 14 days. You will then return monitor and event diary in provided box. It takes 1-2 weeks for report to be downloaded and returned to [place]. We will call you with the results. If monitor falls off or has orange flashing light, please call Zio for further instructions.     Follow-Up: At [facility], you and your health needs are our priority.  As part of our continuing mission to provide you with exceptional heart care, we have created designated Provider Care Teams.  These Care Teams include your primary Cardiologist (physician) and Advanced Practice Providers (APPs -  Physician Assistants and Nurse Practitioners) who all work together to provide you with the care you need, when you need it.  We recommend  signing up for the patient portal called "MyChart".  Sign up information is provided on this After Visit Summary.  MyChart is used to connect with patients for Virtual Visits (Telemedicine).  Patients are able to view lab/test results, encounter notes, upcoming appointments, etc.  Non-urgent messages can be sent to your provider as well.   To learn more about what you can do  with MyChart, go to [email or web address].    Your next appointment:   3 month(s)  The format for your next appointment:   In Person  Provider:   Berniece Salines, DO   Other Instructions  ZIO WHY IS MY DOCTOR PRESCRIBING ZIO? The Zio system is proven and trusted by physicians to detect and diagnose irregular heart rhythms -- and has been prescribed to hundreds of thousands of patients.  The FDA has cleared the Zio system to monitor for many different kinds of irregular heart rhythms. In a study, physicians were able to reach a diagnosis 90% of the time with the Zio system1.  You can wear the Zio monitor -- a small, discreet, comfortable patch -- during your normal day-to-day activity, including while you sleep, shower, and exercise, while it records every single heartbeat for analysis.  1Barrett, P., et al. Comparison of 24 Hour Holter Monitoring Versus 14 Day Novel Adhesive Patch Electrocardiographic Monitoring. [place], 2014.  ZIO VS. HOLTER MONITORING The Zio monitor can be comfortably worn for up to 14 days. Holter monitors can be worn for 24 to 48 hours, limiting the time to record any irregular heart rhythms you may have. Zio is able to capture data for the 51% of patients who have their first symptom-triggered arrhythmia after 48 hours.1  LIVE WITHOUT RESTRICTIONS The Zio ambulatory cardiac monitor is a small, unobtrusive, and water-resistant patch--you might even forget you're wearing it. The Zio monitor records and stores every beat of your heart, whether you're sleeping, working out, or  showering.     Adopting a Healthy Lifestyle.  Know what a healthy weight is for you (roughly BMI <25) and aim to maintain this   Aim for 7+ servings of fruits and vegetables daily   65-80+ fluid ounces of water or unsweet tea for healthy kidneys   Limit to max 1 drink of alcohol per day; avoid smoking/tobacco   Limit animal fats in diet for cholesterol and heart health - choose grass fed whenever available   Avoid highly processed foods, and foods high in saturated/trans fats   Aim for low stress - take time to unwind and care for your mental health   Aim for 150 min of moderate intensity exercise weekly for heart health, and weights twice weekly for bone health   Aim for 7-9 hours of sleep daily   When it comes to diets, agreement about the perfect plan isnt easy to find, even among the experts. Experts at the [employer] developed an idea known as the Healthy Eating Plate. Just imagine a plate divided into logical, healthy portions.   The emphasis is on diet quality:   Load up on vegetables and fruits - one-half of your plate: Aim for color and variety, and remember that potatoes dont count.   Go for whole grains - one-quarter of your plate: Whole wheat, barley, wheat berries, quinoa, oats, brown rice, and foods made with them. If you want pasta, go with whole wheat pasta.   Protein power - one-quarter of your plate: Fish, chicken, beans, and nuts are all healthy, versatile protein sources. Limit red meat.   The diet, however, does go beyond the plate, offering a few other suggestions.   Use healthy plant oils, such as olive, canola, soy, corn, sunflower and peanut. Check the labels, and avoid partially hydrogenated oil, which have unhealthy trans fats.   If youre thirsty, drink water. Coffee and tea are good in moderation, but skip sugary  drinks and limit milk and dairy products to one or two daily servings.   The type of carbohydrate in the diet is  more important than the amount. Some sources of carbohydrates, such as vegetables, fruits, whole grains, and beans-are healthier than others.   Finally, stay active  Signed, Berniece Salines, DO  02/19/2021 11:09 AM    [place]

## 2021-02-19 NOTE — Patient Instructions (Addendum)
Medication Instructions:  Your physician recommends that you continue on your current medications as directed. Please refer to the Current Medication list given to you today.  *If you need a refill on your cardiac medications before your next appointment, please call your pharmacy*   Lab Work: Your physician recommends that you return for lab work: TODAY: Vitamin D If you have labs (blood work) drawn today and your tests are completely normal, you will receive your results only by: [name] MyChart Message (if you have MyChart) OR . A paper copy in the mail If you have any lab test that is abnormal or we need to change your treatment, we will call you to review the results.   Testing/Procedures: A zio monitor was ordered today. It will remain on for 14 days. You will then return monitor and event diary in provided box. It takes 1-2 weeks for report to be downloaded and returned to [place]. We will call you with the results. If monitor falls off or has orange flashing light, please call Zio for further instructions.     Follow-Up: At [facility], you and your health needs are our priority.  As part of our continuing mission to provide you with exceptional heart care, we have created designated Provider Care Teams.  These Care Teams include your primary Cardiologist (physician) and Advanced Practice Providers (APPs -  Physician Assistants and Nurse Practitioners) who all work together to provide you with the care you need, when you need it.  We recommend signing up for the patient portal called "MyChart".  Sign up information is provided on this After Visit Summary.  MyChart is used to connect with patients for Virtual Visits (Telemedicine).  Patients are able to view lab/test results, encounter notes, upcoming appointments, etc.  Non-urgent messages can be sent to your provider as well.   To learn more about what you can do with MyChart, go to [email or web address].    Your next appointment:   3  month(s)  The format for your next appointment:   In Person  Provider:   Berniece Salines, DO   Other Instructions  ZIO WHY IS MY DOCTOR PRESCRIBING ZIO? The Zio system is proven and trusted by physicians to detect and diagnose irregular heart rhythms -- and has been prescribed to hundreds of thousands of patients.  The FDA has cleared the Zio system to monitor for many different kinds of irregular heart rhythms. In a study, physicians were able to reach a diagnosis 90% of the time with the Zio system1.  You can wear the Zio monitor -- a small, discreet, comfortable patch -- during your normal day-to-day activity, including while you sleep, shower, and exercise, while it records every single heartbeat for analysis.  1Barrett, P., et al. Comparison of 24 Hour Holter Monitoring Versus 14 Day Novel Adhesive Patch Electrocardiographic Monitoring. Nichols, 2014.  ZIO VS. HOLTER MONITORING The Zio monitor can be comfortably worn for up to 14 days. Holter monitors can be worn for 24 to 48 hours, limiting the time to record any irregular heart rhythms you may have. Zio is able to capture data for the 51% of patients who have their first symptom-triggered arrhythmia after 48 hours.1  LIVE WITHOUT RESTRICTIONS The Zio ambulatory cardiac monitor is a small, unobtrusive, and water-resistant patch--you might even forget you're wearing it. The Zio monitor records and stores every beat of your heart, whether you're sleeping, working out, or showering.

## 2021-02-20 ENCOUNTER — Other Ambulatory Visit: Payer: Self-pay

## 2021-02-20 ENCOUNTER — Other Ambulatory Visit (HOSPITAL_BASED_OUTPATIENT_CLINIC_OR_DEPARTMENT_OTHER): Payer: Self-pay

## 2021-02-20 DIAGNOSIS — R002 Palpitations: Secondary | ICD-10-CM

## 2021-02-20 LAB — VITAMIN D 25 HYDROXY (VIT D DEFICIENCY, FRACTURES): Vit D, 25-Hydroxy: 17.3 ng/mL — ABNORMAL LOW (ref 30.0–100.0)

## 2021-02-20 MED ORDER — VITAMIN D (ERGOCALCIFEROL) 1.25 MG (50000 UNIT) PO CAPS
50000.0000 [IU] | ORAL_CAPSULE | ORAL | 0 refills | Status: DC
  Filled 2021-02-20: qty 12, 84d supply, fill #0

## 2021-02-20 NOTE — Progress Notes (Signed)
Prescription sent to pharmacy.

## 2021-03-05 ENCOUNTER — Other Ambulatory Visit: Payer: Self-pay | Admitting: Family

## 2021-03-05 ENCOUNTER — Other Ambulatory Visit (HOSPITAL_BASED_OUTPATIENT_CLINIC_OR_DEPARTMENT_OTHER): Payer: Self-pay

## 2021-03-05 ENCOUNTER — Encounter: Payer: Self-pay | Admitting: Family

## 2021-03-05 MED ORDER — AMPHETAMINE-DEXTROAMPHET ER 30 MG PO CP24
ORAL_CAPSULE | Freq: Every morning | ORAL | 0 refills | Status: DC
  Filled 2021-03-05: qty 30, 30d supply, fill #0

## 2021-03-13 ENCOUNTER — Ambulatory Visit: Payer: No Typology Code available for payment source | Admitting: Physician Assistant

## 2021-03-26 ENCOUNTER — Other Ambulatory Visit (HOSPITAL_BASED_OUTPATIENT_CLINIC_OR_DEPARTMENT_OTHER): Payer: Self-pay

## 2021-03-26 MED FILL — Ondansetron Orally Disintegrating Tab 4 MG: ORAL | 10 days supply | Qty: 30 | Fill #0 | Status: AC

## 2021-04-02 ENCOUNTER — Telehealth: Payer: Self-pay | Admitting: *Deleted

## 2021-04-02 ENCOUNTER — Ambulatory Visit: Payer: Self-pay | Admitting: Neurology

## 2021-04-02 ENCOUNTER — Other Ambulatory Visit: Payer: Self-pay | Admitting: Family

## 2021-04-02 NOTE — Telephone Encounter (Signed)
The request has been approved. The authorization is effective for a maximum of 12 fills from 04/02/2021 to 04/01/2022, as long as the member is enrolled in their current health plan. This has been approved for a quantity limit of 18 with a day supply limit of 30. A written notification letter will follow with additional details.

## 2021-04-02 NOTE — Telephone Encounter (Signed)
Completed [employer] on Cover My Meds. Key: [id number]. Awaiting determination from [place].

## 2021-04-03 NOTE — Telephone Encounter (Signed)
Last RX: 03-05-2021 Last OV: 01-22-2021 Next OV: 04-25-2021 UDS:  09-17-2020 CSC:  09-17-2020

## 2021-04-04 ENCOUNTER — Other Ambulatory Visit (HOSPITAL_BASED_OUTPATIENT_CLINIC_OR_DEPARTMENT_OTHER): Payer: Self-pay

## 2021-04-04 MED ORDER — AMPHETAMINE-DEXTROAMPHET ER 30 MG PO CP24
ORAL_CAPSULE | Freq: Every morning | ORAL | 0 refills | Status: DC
  Filled 2021-04-04: qty 30, 30d supply, fill #0

## 2021-04-23 ENCOUNTER — Other Ambulatory Visit: Payer: Self-pay | Admitting: Family

## 2021-04-23 ENCOUNTER — Other Ambulatory Visit (HOSPITAL_BASED_OUTPATIENT_CLINIC_OR_DEPARTMENT_OTHER): Payer: Self-pay

## 2021-04-23 MED ORDER — AMPHETAMINE-DEXTROAMPHET ER 30 MG PO CP24
ORAL_CAPSULE | Freq: Every morning | ORAL | 0 refills | Status: DC
  Filled 2021-04-23: qty 30, fill #0
  Filled 2021-05-21: qty 30, 30d supply, fill #0

## 2021-04-25 ENCOUNTER — Ambulatory Visit (INDEPENDENT_AMBULATORY_CARE_PROVIDER_SITE_OTHER): Payer: No Typology Code available for payment source | Admitting: Family

## 2021-04-25 ENCOUNTER — Other Ambulatory Visit: Payer: Self-pay

## 2021-04-25 VITALS — BP 138/99 | HR 73 | Temp 97.9°F | Resp 16 | Wt 200.0 lb

## 2021-04-25 DIAGNOSIS — I10 Essential (primary) hypertension: Secondary | ICD-10-CM

## 2021-04-25 DIAGNOSIS — F900 Attention-deficit hyperactivity disorder, predominantly inattentive type: Secondary | ICD-10-CM

## 2021-04-25 DIAGNOSIS — F909 Attention-deficit hyperactivity disorder, unspecified type: Secondary | ICD-10-CM

## 2021-04-25 DIAGNOSIS — D509 Iron deficiency anemia, unspecified: Secondary | ICD-10-CM

## 2021-04-25 DIAGNOSIS — G43909 Migraine, unspecified, not intractable, without status migrainosus: Secondary | ICD-10-CM

## 2021-04-25 DIAGNOSIS — J309 Allergic rhinitis, unspecified: Secondary | ICD-10-CM

## 2021-04-25 LAB — FERRITIN: Ferritin: 18.2 ng/mL (ref 10.0–291.0)

## 2021-04-25 LAB — IRON: Iron: 86 ug/dL (ref 42–145)

## 2021-04-25 NOTE — Patient Instructions (Signed)
Back Exercises The following exercises strengthen the muscles that help to support the trunk and back. They also help to keep the lower back flexible. Doing these exercises can help to prevent back pain or lessen existing pain. If you have back pain or discomfort, try doing these exercises 2-3 times each day or as told by your health care provider. As your pain improves, do them once each day, but increase the number of times that you repeat the steps for each exercise (do more repetitions). To prevent the recurrence of back pain, continue to do these exercises once each day or as told by your health care provider. Do exercises exactly as told by your health care provider and adjust them as directed. It is normal to feel mild stretching, pulling, tightness, or discomfort as you do these exercises, but you should stop right away if youfeel sudden pain or your pain gets worse. Exercises Single knee to chest Repeat these steps 3-5 times for each leg: Lie on your back on a firm bed or the floor with your legs extended. Bring one knee to your chest. Your other leg should stay extended and in contact with the floor. Hold your knee in place by grabbing your knee or thigh with both hands and hold. Pull on your knee until you feel a gentle stretch in your lower back or buttocks. Hold the stretch for 10-30 seconds. Slowly release and straighten your leg. Pelvic tilt Repeat these steps 5-10 times: Lie on your back on a firm bed or the floor with your legs extended. Bend your knees so they are pointing toward the ceiling and your feet are flat on the floor. Tighten your lower abdominal muscles to press your lower back against the floor. This motion will tilt your pelvis so your tailbone points up toward the ceiling instead of pointing to your feet or the floor. With gentle tension and even breathing, hold this position for 5-10 seconds. Cat-cow Repeat these steps until your lower back becomes more  flexible: Get into a hands-and-knees position on a firm surface. Keep your hands under your shoulders, and keep your knees under your hips. You may place padding under your knees for comfort. Let your head hang down toward your chest. Contract your abdominal muscles and point your tailbone toward the floor so your lower back becomes rounded like the back of a cat. Hold this position for 5 seconds. Slowly lift your head, let your abdominal muscles relax and point your tailbone up toward the ceiling so your back forms a sagging arch like the back of a cow. Hold this position for 5 seconds.  Press-ups Repeat these steps 5-10 times: Lie on your abdomen (face-down) on the floor. Place your palms near your head, about shoulder-width apart. Keeping your back as relaxed as possible and keeping your hips on the floor, slowly straighten your arms to raise the top half of your body and lift your shoulders. Do not use your back muscles to raise your upper torso. You may adjust the placement of your hands to make yourself more comfortable. Hold this position for 5 seconds while you keep your back relaxed. Slowly return to lying flat on the floor.  Bridges Repeat these steps 10 times: Lie on your back on a firm surface. Bend your knees so they are pointing toward the ceiling and your feet are flat on the floor. Your arms should be flat at your sides, next to your body. Tighten your buttocks muscles and lift your   buttocks off the floor until your waist is at almost the same height as your knees. You should feel the muscles working in your buttocks and the back of your thighs. If you do not feel these muscles, slide your feet 1-2 inches farther away from your buttocks. Hold this position for 3-5 seconds. Slowly lower your hips to the starting position, and allow your buttocks muscles to relax completely. If this exercise is too easy, try doing it with your arms crossed over yourchest. Abdominal  crunches Repeat these steps 5-10 times: Lie on your back on a firm bed or the floor with your legs extended. Bend your knees so they are pointing toward the ceiling and your feet are flat on the floor. Cross your arms over your chest. Tip your chin slightly toward your chest without bending your neck. Tighten your abdominal muscles and slowly raise your trunk (torso) high enough to lift your shoulder blades a tiny bit off the floor. Avoid raising your torso higher than that because it can put too much stress on your low back and does not help to strengthen your abdominal muscles. Slowly return to your starting position. Back lifts Repeat these steps 5-10 times: Lie on your abdomen (face-down) with your arms at your sides, and rest your forehead on the floor. Tighten the muscles in your legs and your buttocks. Slowly lift your chest off the floor while you keep your hips pressed to the floor. Keep the back of your head in line with the curve in your back. Your eyes should be looking at the floor. Hold this position for 3-5 seconds. Slowly return to your starting position. Contact a health care provider if: Your back pain or discomfort gets much worse when you do an exercise. Your worsening back pain or discomfort does not lessen within 2 hours after you exercise. If you have any of these problems, stop doing these exercises right away. Do not do them again unless your health care provider says that you can. Get help right away if: You develop sudden, severe back pain. If this happens, stop doing the exercises right away. Do not do them again unless your health care provider says that you can. This information is not intended to replace advice given to you by your health care provider. Make sure you discuss any questions you have with your healthcare provider. Document Revised: 03/02/2019 Document Reviewed: 07/28/2018 Elsevier Patient Education  [place].

## 2021-04-25 NOTE — Assessment & Plan Note (Signed)
Stable on adderall xr 30mg . Continue same. Update UDS.

## 2021-04-25 NOTE — Assessment & Plan Note (Signed)
Reports stable. Management per neurology.

## 2021-04-25 NOTE — Assessment & Plan Note (Signed)
Last H/H normal. Continues to have heavy menstrual cycles.  Continues iron supplement. Will check iron studies.

## 2021-04-25 NOTE — Assessment & Plan Note (Signed)
Initial bp was elevated.   BP Readings from Last 3 Encounters:  04/25/21 (!) 138/99  02/19/21 130/86  01/22/21 118/71   Repeat BP 140/92.  Will continue amlodipine 5 mg, toprol xl 50mg  once daily. She will keep her appointment with cardiology and continue to check her bp at home. She will reach out if she notes high readings at home.

## 2021-04-25 NOTE — Progress Notes (Signed)
Subjective:   By signing my name below, I, [name], attest that this documentation has been prepared under the direction and in the presence of [name]. 04/25/2021      Patient ID: Kaitlin Foster, female    DOB: 07-Mar-1981, 40 y.o.   MRN: [id number]  Chief Complaint  Patient presents with   Hypertension    Here for follow up   ADHD    Here for follow up    HPI Patient is in today for a office visit. She complains of back pain. She is interested if a chiropractor can manage her pain.   Migraine- She is managing her migraines well at this time. She missed her botox appointment. Anxiety- She started using talkspace therapy service and reports doing well. She continues taking 30 mg adderall daily PO. Allergy- She is manage her allergies well while on 5 mg xyzal daily PO. 5 mg Xyzal daily PO also helps with her migraines. Iron- She continues taking iron supplement. She continues having heavy bleeding during menstrual cycles. Hypertension- She has occasional leg swelling. Her blood pressure is elevated during this visit. She continues taking 5 mg amlodipine daily PO. 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;   2 stage embolization ACA supply  to AVM @ [facility]  Sept 11th and 18th 2008 then craniotomy resection AVM 07-28-2007   Hypertension    Left ureteral stone    Migraines    Nephrolithiasis    left renal nonobstructive stone per CT 06-14-2019   Uterine fibroid     Past Surgical History:  Procedure Laterality Date   CEREBRAL EMBOLIZATION  Sept 11th and 18th, 2008   @[facility]    2 staged embolization left ACA supply to AVM   CESAREAN SECTION  07/06/2009   CRANIOTOMY  07-29-2007   @ [facility]   resection left frontal AVM    Family History  Problem Relation Age of Onset   Hypertension Father    Alcohol abuse Father    Arthritis Father    Obesity Father    Hypertension Mother    Arthritis Mother    Migraines Mother    Obesity Mother    Cancer Mother        sinus tumor   Migraines Sister    Ulcerative colitis Sister    Migraines Maternal Aunt    ADD / ADHD Son    Cancer Maternal Grandmother        colon diagnosed at age 55   Dementia Maternal Grandmother    Heart disease Neg Hx     Social History   Socioeconomic History   Marital status: Married    Spouse name: Thad  Number of children: 1   Years of education: Not on file   Highest education level: Associate degree: academic program  Occupational History   Occupation: RN  Tobacco Use   Smoking status: Never   Smokeless tobacco: Never  Vaping Use   Vaping Use: Never used  Substance and Sexual Activity   Alcohol use: No    Alcohol/week: 0.0 standard drinks   Drug use: No   Sexual activity: Yes    Birth control/protection: Condom  Other Topics Concern   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 30 capsule 0   famotidine (PEPCID) 20 MG tablet Take 20 mg by mouth daily as needed for heartburn or indigestion.      Ferrous Sulfate (IRON) 325 (65 Fe) MG TABS Take 1 tablet (325 mg total) by mouth daily. 30 tablet 0   fluticasone (FLONASE) 50 MCG/ACT nasal spray Place into both nostrils as needed for allergies or rhinitis.      levocetirizine (XYZAL) 5 MG tablet TAKE 1 TABLET (5 MG TOTAL) BY MOUTH EVERY EVENING. 90 tablet 3   metoprolol succinate (TOPROL-XL) 50 MG 24 hr tablet Take 1 tablet (50 mg total) by mouth daily. Take with or immediately following a meal. 90 tablet 0   ondansetron (ZOFRAN-ODT) 4 MG disintegrating tablet DISSOLVE 1 TABLET (4 MG TOTAL) BY MOUTH EVERY 8 (EIGHT) HOURS AS NEEDED FOR NAUSEA. 30 tablet 3   Rimegepant Sulfate 75 MG TBDP TAKE 1 TABLET (75 MG) BY MOUTH DAILY AS NEEDED FOR MIGRAINES. TAKE AS CLOSE TO ONSET OF MIGRAINE AS POSSIBLE. ONE DAILY MAXIMUM. 8 tablet 6   rizatriptan (MAXALT-MLT) 10 MG disintegrating tablet Take 1 tablet (10 mg total) by mouth as needed for migraine. May repeat in 2 hours if needed 9 tablet 11   Vitamin D, Ergocalciferol, (DRISDOL) 1.25 MG (50000 UNIT) CAPS capsule Take 1 capsule (50,000 Units total) by mouth every 7 (seven) days. 12 capsule 0   No facility-administered medications prior to visit.    Allergies  Allergen Reactions   Codeine Other (See Comments)    REACTION: upset stomach once, but has taken it other times and been ok   Lisinopril Other (See Comments)    cough   Sulfa Antibiotics Hives    Review of Systems  Musculoskeletal:  Positive for back pain.       Objective:    Physical Exam Constitutional:      General: She is not in acute distress.    Appearance: Normal appearance. She is not ill-appearing.  HENT:     Head: Normocephalic and atraumatic.     Right Ear: External ear normal.  Left Ear: External ear normal.  Eyes:     Extraocular Movements: Extraocular movements intact.     Pupils: Pupils are equal, round, and reactive to light.  Cardiovascular:     Rate and Rhythm: Normal rate and regular rhythm.     Pulses: Normal pulses.     Heart sounds: Normal heart sounds. No murmur heard.   No gallop.     Comments: Her blood pressure measured 140/92 during the physical exam. Pulmonary:     Effort: Pulmonary effort is normal. No respiratory distress.     Breath sounds: Normal breath sounds. No wheezing, rhonchi or rales.  Skin:    General: Skin is warm and dry.  Neurological:     Mental Status: She is alert and oriented to person, place, and time.  Psychiatric:        Behavior: Behavior normal.    BP (!) 138/99 (BP Location: Left Arm, Patient Position: Sitting, Cuff Size: Small)   Pulse 73   Temp 97.9 F (36.6 C) (Oral)   Resp 16   Wt 200 lb (90.7 kg)   SpO2 100%   BMI 34.33 kg/m  Wt Readings from Last 3 Encounters:  04/25/21 200 lb (90.7 kg)  02/19/21 205 lb (93 kg)  01/22/21 203 lb 12.8 oz (92.4 kg)       Assessment & Plan:   Problem List Items Addressed This Visit       Unprioritized   Migraines    Reports stable. Management per neurology.        Hypertension    Initial bp was elevated.   BP Readings from Last 3 Encounters:  04/25/21 (!) 138/99  02/19/21 130/86  01/22/21 118/71  Repeat BP 140/92.  Will continue amlodipine 5 mg, toprol xl 50mg  once daily. She will keep her appointment with cardiology and continue to check her bp at home. She will reach out if she notes high readings at home.        Anemia - Primary    Last H/H normal. Continues to have heavy menstrual cycles.  Continues iron  supplement. Will check iron studies.        Relevant Orders   Iron   Ferritin   Iron Binding Cap (TIBC)([facility])   Allergic rhinitis    Stable on xyzal 5mg  once daily. Continue same.        ADHD, predominantly inattentive type    Stable on adderall xr 30mg . Continue same. Update UDS.       Other Visit Diagnoses     Attention deficit hyperactivity disorder (ADHD), unspecified ADHD type       Relevant Orders   DRUG MONITORING, PANEL 8 WITH CONFIRMATION, URINE        No orders of the defined types were placed in this encounter.   I, [name], personally preformed the services described in this documentation.  All medical record entries made by the scribe were at my direction and in my presence.  I have reviewed the chart and discharge instructions (if applicable) and agree that the record reflects my personal performance and is accurate and complete. 04/25/2021   I,[name],acting as a Education administrator for Nance Pear, NP.,have documented all relevant documentation on the behalf of Nance Pear, NP,as directed by  Nance Pear, NP while in the presence of Nance Pear, NP.   Nance Pear, NP

## 2021-04-25 NOTE — Assessment & Plan Note (Signed)
Stable on xyzal 5mg  once daily. Continue same.

## 2021-04-26 LAB — IRON AND TIBC
Iron Saturation: 26 % (ref 15–55)
Iron: 78 ug/dL (ref 27–159)
Total Iron Binding Capacity: 299 ug/dL (ref 250–450)
UIBC: 221 ug/dL (ref 131–425)

## 2021-04-29 LAB — DRUG MONITORING, PANEL 8 WITH CONFIRMATION, URINE
6 Acetylmorphine: NEGATIVE ng/mL (ref <10)
Alcohol Metabolites: NEGATIVE ng/mL
Amphetamine: 8178 ng/mL — ABNORMAL HIGH (ref <250)
Amphetamines: POSITIVE ng/mL — AB (ref <500)
Benzodiazepines: NEGATIVE ng/mL (ref <100)
Buprenorphine, Urine: NEGATIVE ng/mL (ref <5)
Cocaine Metabolite: NEGATIVE ng/mL (ref <150)
Creatinine: 110.5 mg/dL
MDMA: NEGATIVE ng/mL (ref <500)
Marijuana Metabolite: NEGATIVE ng/mL (ref <20)
Methamphetamine: NEGATIVE ng/mL (ref <250)
Opiates: NEGATIVE ng/mL (ref <100)
Oxidant: NEGATIVE ug/mL
Oxycodone: NEGATIVE ng/mL (ref <100)
pH: 6.4 (ref 4.5–9.0)

## 2021-04-29 LAB — DM TEMPLATE

## 2021-05-19 ENCOUNTER — Telehealth: Payer: Self-pay | Admitting: Family

## 2021-05-19 NOTE — Telephone Encounter (Signed)
Patient calling about an EOB for  220.00  for Drug screen on 04/25/21. That states insurance paid 0.00 due to the test not capable to Dx Code. Patient states she is on addreall and t should be covered. She wants to knw if Lenna Sciara can resubmit with approved diagnose code

## 2021-05-21 ENCOUNTER — Other Ambulatory Visit (HOSPITAL_BASED_OUTPATIENT_CLINIC_OR_DEPARTMENT_OTHER): Payer: Self-pay

## 2021-05-27 ENCOUNTER — Ambulatory Visit (INDEPENDENT_AMBULATORY_CARE_PROVIDER_SITE_OTHER): Payer: No Typology Code available for payment source | Admitting: Neurology

## 2021-05-27 ENCOUNTER — Other Ambulatory Visit: Payer: Self-pay

## 2021-05-27 DIAGNOSIS — G43711 Chronic migraine without aura, intractable, with status migrainosus: Secondary | ICD-10-CM | POA: Diagnosis not present

## 2021-05-27 NOTE — Progress Notes (Signed)
Botox- 200 units x 1 vial Lot: [id number] Expiration: 11/2023 NDC: 1962-2297-98   Bacteriostatic 0.9% Sodium Chloride- 44mL total [id number] Expiration: 02/23 NDC: 81448-185-63  B/b

## 2021-05-27 NOTE — Progress Notes (Signed)
Consent Form Botulism Toxin Injection For Chronic Migraine  05/27/2021: Stable  Interval history 01/01/2022: Baseline daily headaches and16 migraine days. Since last botox she has had only 3 total very mild migraines a month and acute management works, [facility]tec works with zofran, she had side effects to zembrace, also [place] works, takes with zofran. More than 25 free headache days a month, exceptional improvement!+all.  She is very happy. She is on adderall now but not causing headache.   Orders: [facility] or [place] for dry needling for cervical and lumbar/lumbosacral dry needling for myofascial disease in the past  No orders of the defined types were placed in this encounter.  No orders of the defined types were placed in this encounter.   Reviewed orally with patient, additionally signature is on file:  Botulism toxin has been approved by the Federal drug administration for treatment of chronic migraine. Botulism toxin does not cure chronic migraine and it may not be effective in some patients.  The administration of botulism toxin is accomplished by injecting a small amount of toxin into the muscles of the neck and head. Dosage must be titrated for each individual. Any benefits resulting from botulism toxin tend to wear off after 3 months with a repeat injection required if benefit is to be maintained. Injections are usually done every 3-4 months with maximum effect peak achieved by about 2 or 3 weeks. Botulism toxin is expensive and you should be sure of what costs you will incur resulting from the injection.  The side effects of botulism toxin use for chronic migraine may include:   -Transient, and usually mild, facial weakness with facial injections  -Transient, and usually mild, head or neck weakness with head/neck injections  -Reduction or loss of forehead facial animation due to forehead muscle weakness  -Eyelid drooping  -Dry eye  -Pain at the site of injection  or bruising at the site of injection  -Double vision  -Potential unknown long term risks  Contraindications: You should not have Botox if you are pregnant, nursing, allergic to albumin, have an infection, skin condition, or muscle weakness at the site of the injection, or have myasthenia gravis, Lambert-Eaton syndrome, or ALS.  It is also possible that as with any injection, there may be an allergic reaction or no effect from the medication. Reduced effectiveness after repeated injections is sometimes seen and rarely infection at the injection site may occur. All care will be taken to prevent these side effects. If therapy is given over a long time, atrophy and wasting in the muscle injected may occur. Occasionally the patient's become refractory to treatment because they develop antibodies to the toxin. In this event, therapy needs to be modified.  I have read the above information and consent to the administration of botulism toxin.    BOTOX PROCEDURE NOTE FOR MIGRAINE HEADACHE    Contraindications and precautions discussed with patient(above). Aseptic procedure was observed and patient tolerated procedure. Procedure performed by Dr. Georgia Dom  The condition has existed for more than 6 months, and pt does not have a diagnosis of ALS, Myasthenia Gravis or Lambert-Eaton Syndrome.  Risks and benefits of injections discussed and pt agrees to proceed with the procedure.  Written consent obtained  These injections are medically necessary. Pt  receives good benefits from these injections. These injections do not cause sedations or hallucinations which the oral therapies may cause.  Description of procedure:  The patient was placed in a sitting position. The standard protocol was  used for Botox as follows, with 5 units of Botox injected at each site:   -Procerus muscle, midline injection  -Corrugator muscle, bilateral injection  -Frontalis muscle, bilateral injection, with 2 sites each  side, medial injection was performed in the upper one third of the frontalis muscle, in the region vertical from the medial inferior edge of the superior orbital rim. The lateral injection was again in the upper one third of the forehead vertically above the lateral limbus of the cornea, 1.5 cm lateral to the medial injection site.  -Temporalis muscle injection, 5 sites, bilaterally. The first injection was 3 cm above the tragus of the ear, second injection site was 1.5 cm to 3 cm up from the first injection site in line with the tragus of the ear. The third injection site was 1.5-3 cm forward between the first 2 injection sites. The fourth injection site was 1.5 cm posterior to the second injection site. 5th site laterally in the temporalis  muscleat the level of the outer canthus.  - Patient feels her clenching is a trigger for headaches. +5 units masseter bilaterally   - Patient feels the migraines are centered around the eyes +5 units bilaterally at the outer canthus in the orbicularis occuli  -Occipitalis muscle injection, 3 sites, bilaterally. The first injection was done one half way between the occipital protuberance and the tip of the mastoid process behind the ear. The second injection site was done lateral and superior to the first, 1 fingerbreadth from the first injection. The third injection site was 1 fingerbreadth superiorly and medially from the first injection site.  -Cervical paraspinal muscle injection, 2 sites, bilateral knee first injection site was 1 cm from the midline of the cervical spine, 3 cm inferior to the lower border of the occipital protuberance. The second injection site was 1.5 cm superiorly and laterally to the first injection site.  -Trapezius muscle injection was performed at 3 sites, bilaterally. The first injection site was in the upper trapezius muscle halfway between the inflection point of the neck, and the acromion. The second injection site was one half way  between the acromion and the first injection site. The third injection was done between the first injection site and the inflection point of the neck.   Will return for repeat injection in 3 months.   A 200 unit sof Botox was used, any Botox not injected was wasted. The patient tolerated the procedure well, there were no complications of the above procedure.

## 2021-06-11 ENCOUNTER — Telehealth: Payer: Self-pay

## 2021-06-11 ENCOUNTER — Encounter: Payer: Self-pay | Admitting: Cardiology

## 2021-06-11 ENCOUNTER — Telehealth (INDEPENDENT_AMBULATORY_CARE_PROVIDER_SITE_OTHER): Payer: No Typology Code available for payment source | Admitting: Cardiology

## 2021-06-11 VITALS — BP 117/80 | HR 81 | Ht 64.0 in | Wt 197.0 lb

## 2021-06-11 DIAGNOSIS — E669 Obesity, unspecified: Secondary | ICD-10-CM

## 2021-06-11 DIAGNOSIS — I10 Essential (primary) hypertension: Secondary | ICD-10-CM | POA: Diagnosis not present

## 2021-06-11 DIAGNOSIS — R002 Palpitations: Secondary | ICD-10-CM

## 2021-06-11 NOTE — Telephone Encounter (Signed)
Left message for patient to return call regarding her My Chart Video Visit schedule at 09:00 with Dr Harriet Masson

## 2021-06-11 NOTE — Progress Notes (Addendum)
Virtual Visit via Video Note   This visit type was conducted due to national recommendations for restrictions regarding the COVID-19 Pandemic (e.g. social distancing) in an effort to limit this patient's exposure and mitigate transmission in our community.  Due to her co-morbid illnesses, this patient is at least at moderate risk for complications without adequate follow up.  This format is felt to be most appropriate for this patient at this time.  All issues noted in this document were discussed and addressed.  A limited physical exam was performed with this format.  Please refer to the patient's chart for her consent to telehealth for [facility].      Date:  06/12/2021   ID:  Kaitlin Foster, DOB 1981/08/04, MRN [id number]  Patient Location: Home Virtual Video Note. I connected with patient on 06/11/2021 by a video enabled telemedicine application and verified that I am speaking with the correct person using two identifiers.   Provider Location: Home Office  PCP:  [name], NP  Cardiologist:  Berniece Salines, DO  Electrophysiologist:  None   Evaluation Performed:  Follow-Up Visit  Chief Complaint:  intermittent palpitations  History of Present Illness:    Kaitlin Foster is a 40 y.o. female with hypertension, obesity is here today to be evaluated for palpitations. I saw the patient on 02/19/2021 at that time she was experiencing palpitations. She noted that her apple watch reported PVCs as well.  As a last saw the patient she has not had any worsening palpitations.  She is only had 2 episodes of palpitations.  Next  The patient does not have symptoms concerning for COVID-19 infection (fever, chills, cough, or new shortness of breath).    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;   2 stage embolization ACA supply to AVM @ [facility]  Sept 11th and 18th 2008 then craniotomy resection AVM 07-28-2007   Hypertension    Left ureteral stone    Migraines    Nephrolithiasis    left renal nonobstructive stone per CT 06-14-2019   Uterine fibroid    Past Surgical History:  Procedure Laterality Date   CEREBRAL EMBOLIZATION  Sept 11th and 18th, 2008   '@[facility]'$    2 staged embolization left ACA supply to AVM   CESAREAN SECTION  07/06/2009   CRANIOTOMY  07-29-2007   @ [facility]   resection left frontal AVM     Current Meds  Medication Sig   amLODipine (NORVASC) 5 MG tablet TAKE 1 TABLET BY MOUTH ONCE DAILY   amphetamine-dextroamphetamine (ADDERALL XR) 30 MG 24 hr capsule TAKE 1 CAPSULE (30 MG TOTAL) BY MOUTH EVERY MORNING.   famotidine (PEPCID) 20 MG tablet Take 20 mg by mouth daily as needed for heartburn or indigestion.    Ferrous Sulfate (IRON) 325 (65 Fe) MG TABS Take 1 tablet (325 mg total) by mouth daily.   fluticasone (FLONASE) 50 MCG/ACT nasal spray Place into both nostrils as needed for allergies or rhinitis.    levocetirizine (XYZAL) 5 MG tablet TAKE 1 TABLET (5 MG TOTAL) BY  MOUTH EVERY EVENING.   metoprolol succinate (TOPROL-XL) 50 MG 24 hr tablet Take 1 tablet (50 mg total) by mouth daily. Take with or immediately following a meal.   ondansetron (ZOFRAN-ODT) 4 MG disintegrating tablet DISSOLVE 1 TABLET (4 MG TOTAL) BY MOUTH EVERY 8 (EIGHT) HOURS AS NEEDED FOR NAUSEA.   Rimegepant Sulfate 75 MG TBDP TAKE 1 TABLET (75 MG) BY MOUTH DAILY AS NEEDED FOR MIGRAINES. TAKE AS CLOSE TO ONSET OF MIGRAINE AS POSSIBLE. ONE DAILY MAXIMUM.   rizatriptan (MAXALT-MLT) 10 MG disintegrating tablet Take 1 tablet (10 mg total) by mouth as needed for migraine. May repeat in  2 hours if needed     Allergies:   Codeine, Lisinopril, and Sulfa antibiotics   Social History   Tobacco Use   Smoking status: Never   Smokeless tobacco: Never  Vaping Use   Vaping Use: Never used  Substance Use Topics   Alcohol use: No    Alcohol/week: 0.0 standard drinks   Drug use: No     Family Hx: The patient's family history includes ADD / ADHD in her son; Alcohol abuse in her father; Arthritis in her father and mother; Cancer in her maternal grandmother and mother; Dementia in her maternal grandmother; Hypertension in her father and mother; Migraines in her maternal aunt, mother, and sister; Obesity in her father and mother; Ulcerative colitis in her sister. There is no history of Heart disease.  ROS:   Please see the history of present illness.     All other systems reviewed and are negative.   Prior CV studies:   The following studies were reviewed today:  Zio monitor  Patch Wear Time:  14 days and 0 hours starting February 20, 2021. Indications: Palpitations   Patient had a minimum HR of 54 bpm, maximal HR of 135 bpm, and average HR of 78 bpm.   Predominant underlying rhythm was Sinus Rhythm.   Premature atrial complexes were rare (<1.0%). Premature ventricular complexes (<1.0%).   6 patient triggered events and 6 diary events associated with sinus rhythm and premature ventricular complex   No ventricular tachycardia, no pauses, no supraventricular tachycardia or atrial fibrillation noted.   Conclusion: Normal/unremarkable study with no evidence of significant arrhythmia.   Labs/Other Tests and Data Reviewed:    EKG:  No ECG reviewed.  Recent Labs: 08/28/2020: ALT 15 01/22/2021: BUN 16; Creatinine, Ser 0.80; Hemoglobin 14.4; Platelets 261.0; Potassium 3.8; Sodium 138; TSH 1.18   Recent Lipid Panel Lab Results  Component Value Date/Time   CHOL 151 08/22/2019 02:14 PM   TRIG 125.0 08/22/2019 02:14 PM   HDL 27.50 (L) 08/22/2019 02:14 PM   CHOLHDL 5  08/22/2019 02:14 PM   LDLCALC 99 08/22/2019 02:14 PM    Wt Readings from Last 3 Encounters:  06/11/21 197 lb (89.4 kg)  04/25/21 200 lb (90.7 kg)  02/19/21 205 lb (93 kg)     Objective:    Vital Signs:  BP 117/80   Pulse 81   Ht '5\' 4"'$  (1.626 m)   Wt 197 lb (89.4 kg)   BMI 33.81 kg/m    Virtual visit did not perform physical exam.  ASSESSMENT & PLAN:    Palpitations Hypertension  She is doing well from a CV standpoint. Continue current medical regimen.  Encouraged diet and exercise.   She has had some improvement.  Intermittent palpitations but only 2 episodes of the last time I saw her.  She will remain on her current dose of beta-blocker. Blood pressure  is acceptable today.  She will continue to monitor this. The patient understands the need to lose weight with diet and exercise. We have discussed specific strategies for this.   COVID-19 Education: The signs and symptoms of COVID-19 were discussed with the patient and how to seek care for testing (follow up with PCP or arrange E-visit).  The importance of social distancing was discussed today.  Time:   Today, I have spent 10 minutes with the patient with telehealth technology discussing the above problems.     Medication Adjustments/Labs and Tests Ordered: Current medicines are reviewed at length with the patient today.  Concerns regarding medicines are outlined above.   Tests Ordered: No orders of the defined types were placed in this encounter.   Medication Changes: No orders of the defined types were placed in this encounter.   Follow Up:  In Person prn  Signed, Berniece Salines, DO  06/12/2021 8:11 AM    [place]

## 2021-06-11 NOTE — Patient Instructions (Signed)
Medication Instructions:  Your physician recommends that you continue on your current medications as directed. Please refer to the Current Medication list given to you today.  *If you need a refill on your cardiac medications before your next appointment, please call your pharmacy*   Lab Work: None If you have labs (blood work) drawn today and your tests are completely normal, you will receive your results only by: [place] (if you have MyChart) OR A paper copy in the mail If you have any lab test that is abnormal or we need to change your treatment, we will call you to review the results.   Testing/Procedures: None   Follow-Up: At [facility], you and your health needs are our priority.  As part of our continuing mission to provide you with exceptional heart care, we have created designated Provider Care Teams.  These Care Teams include your primary Cardiologist (physician) and Advanced Practice Providers (APPs -  Physician Assistants and Nurse Practitioners) who all work together to provide you with the care you need, when you need it.  We recommend signing up for the patient portal called "MyChart".  Sign up information is provided on this After Visit Summary.  MyChart is used to connect with patients for Virtual Visits (Telemedicine).  Patients are able to view lab/test results, encounter notes, upcoming appointments, etc.  Non-urgent messages can be sent to your provider as well.   To learn more about what you can do with MyChart, go to [email or web address].    Your next appointment:   As needed  The format for your next appointment:   In Person  Provider:   [address] - Berniece Salines, DO    Other Instructions

## 2021-06-18 NOTE — Telephone Encounter (Signed)
I sent an email to [employer] on 06/05/21 regarding this issue for the patient.

## 2021-06-20 ENCOUNTER — Other Ambulatory Visit: Payer: Self-pay | Admitting: Family

## 2021-06-20 ENCOUNTER — Other Ambulatory Visit (HOSPITAL_BASED_OUTPATIENT_CLINIC_OR_DEPARTMENT_OTHER): Payer: Self-pay

## 2021-06-20 MED ORDER — AMLODIPINE BESYLATE 5 MG PO TABS
ORAL_TABLET | Freq: Every day | ORAL | 1 refills | Status: DC
  Filled 2021-06-20: qty 90, 90d supply, fill #0
  Filled 2021-09-22: qty 90, 90d supply, fill #1

## 2021-06-20 MED ORDER — METOPROLOL SUCCINATE ER 50 MG PO TB24
50.0000 mg | ORAL_TABLET | Freq: Every day | ORAL | 1 refills | Status: DC
  Filled 2021-06-20: qty 90, 90d supply, fill #0
  Filled 2021-09-22: qty 90, 90d supply, fill #1

## 2021-06-20 MED ORDER — AMPHETAMINE-DEXTROAMPHET ER 30 MG PO CP24
ORAL_CAPSULE | Freq: Every morning | ORAL | 0 refills | Status: DC
  Filled 2021-06-20: qty 30, 30d supply, fill #0

## 2021-06-20 MED ORDER — LEVOCETIRIZINE DIHYDROCHLORIDE 5 MG PO TABS
ORAL_TABLET | Freq: Every evening | ORAL | 3 refills | Status: DC
  Filled 2021-06-20: qty 90, 90d supply, fill #0
  Filled 2021-09-22: qty 90, 90d supply, fill #1
  Filled 2021-10-24 – 2022-01-09 (×2): qty 90, 90d supply, fill #2
  Filled 2022-04-20: qty 90, 90d supply, fill #3

## 2021-06-20 MED FILL — Ondansetron Orally Disintegrating Tab 4 MG: ORAL | 10 days supply | Qty: 30 | Fill #1 | Status: AC

## 2021-06-20 MED FILL — Rimegepant Sulfate Tab Disint 75 MG: ORAL | 30 days supply | Qty: 8 | Fill #1 | Status: AC

## 2021-06-30 ENCOUNTER — Other Ambulatory Visit (HOSPITAL_BASED_OUTPATIENT_CLINIC_OR_DEPARTMENT_OTHER): Payer: Self-pay

## 2021-06-30 MED ORDER — CARESTART COVID-19 HOME TEST VI KIT
PACK | 0 refills | Status: DC
  Filled 2021-06-30: qty 4, 4d supply, fill #0

## 2021-07-16 ENCOUNTER — Ambulatory Visit: Payer: No Typology Code available for payment source | Admitting: Family

## 2021-07-28 ENCOUNTER — Encounter: Payer: Self-pay | Admitting: Family

## 2021-07-29 ENCOUNTER — Other Ambulatory Visit: Payer: Self-pay | Admitting: Family

## 2021-07-29 ENCOUNTER — Other Ambulatory Visit (HOSPITAL_BASED_OUTPATIENT_CLINIC_OR_DEPARTMENT_OTHER): Payer: Self-pay

## 2021-07-29 MED ORDER — CARESTART COVID-19 HOME TEST VI KIT
PACK | 0 refills | Status: DC
  Filled 2021-07-29: qty 4, 4d supply, fill #0

## 2021-07-29 MED ORDER — AMPHETAMINE-DEXTROAMPHET ER 30 MG PO CP24
ORAL_CAPSULE | Freq: Every morning | ORAL | 0 refills | Status: DC
  Filled 2021-07-29: qty 30, 30d supply, fill #0

## 2021-07-29 MED FILL — Ondansetron Orally Disintegrating Tab 4 MG: ORAL | 10 days supply | Qty: 30 | Fill #2 | Status: AC

## 2021-07-29 MED FILL — Rimegepant Sulfate Tab Disint 75 MG: ORAL | 30 days supply | Qty: 8 | Fill #2 | Status: AC

## 2021-07-29 NOTE — Telephone Encounter (Signed)
Requesting: Adderall XR 30mg   Contract: 09/17/2020 UDS: 04/25/2021  Last Visit: 04/25/2021 Next Visit: 07/30/2021 Last Refill: 06/20/2021 #30 and 0RF  Please Advise

## 2021-07-30 ENCOUNTER — Other Ambulatory Visit: Payer: Self-pay

## 2021-07-30 ENCOUNTER — Ambulatory Visit (INDEPENDENT_AMBULATORY_CARE_PROVIDER_SITE_OTHER): Payer: No Typology Code available for payment source | Admitting: Family

## 2021-07-30 VITALS — BP 126/77 | HR 74 | Temp 98.2°F | Resp 16 | Wt 196.0 lb

## 2021-07-30 DIAGNOSIS — D229 Melanocytic nevi, unspecified: Secondary | ICD-10-CM | POA: Insufficient documentation

## 2021-07-30 DIAGNOSIS — B078 Other viral warts: Secondary | ICD-10-CM | POA: Diagnosis not present

## 2021-07-30 NOTE — Progress Notes (Addendum)
Subjective:   By signing my name below, I, Kaitlin Foster, attest that this documentation has been prepared under the direction and in the presence of [name]. 07/30/2021    Patient ID: Kaitlin Foster, female    DOB: 1981-10-27, 40 y.o.   MRN: [id number]  Chief Complaint  Patient presents with   Nevus    Here for biopsy    HPI Patient is in today for a office visit.  She is present to get a mole on her left hip removed and biopsied. She notes that it has enlarged in size and gets irritated and itchy.    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;   2 stage embolization ACA supply to AVM @ [facility]  Sept 11th and 18th 2008 then craniotomy resection AVM 07-28-2007   Hypertension    Left ureteral stone    Migraines    Nephrolithiasis    left renal nonobstructive stone per CT 06-14-2019   Uterine fibroid     Past Surgical History:  Procedure Laterality Date   CEREBRAL EMBOLIZATION  Sept 11th and 18th, 2008   _0    2 staged embolization left ACA supply to AVM   CESAREAN SECTION  07/06/2009   CRANIOTOMY  07-29-2007   @ [facility]   resection left frontal AVM    Family History  Problem Relation Age of Onset   Hypertension Father    Alcohol abuse Father    Arthritis Father    Obesity Father    Hypertension Mother    Arthritis Mother    Migraines Mother    Obesity  Mother    Cancer Mother        sinus tumor   Migraines Sister    Ulcerative colitis Sister    Migraines Maternal Aunt    ADD / ADHD Son    Cancer Maternal Grandmother        colon diagnosed at age 66   Dementia Maternal Grandmother    Heart disease Neg Hx     Social History   Socioeconomic History   Marital status: Married    Spouse name: Thad   Number of children: 1   Years of education: Not on file   Highest education level: Associate degree: academic program  Occupational History   Occupation: Therapist, sports  Tobacco Use   Smoking status: Never   Smokeless tobacco: Never  Vaping Use   Vaping Use: Never used  Substance and Sexual Activity   Alcohol use: No    Alcohol/week: 0.0 standard drinks   Drug use: No   Sexual activity: Yes    Birth control/protection: Condom  Other Topics Concern   Not on file  Social History Narrative   Regular exercise: no regular exercise   Caffeine use:  1-2 coffee daily   Son born 2010  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 30 capsule 0   COVID-19 At Home Antigen Test (CARESTART COVID-19 HOME TEST) KIT Use as directed per package instructions 4 each 0   famotidine (PEPCID) 20 MG tablet Take 20 mg by mouth daily as needed for heartburn or indigestion.      Ferrous Sulfate (IRON) 325 (65 Fe) MG TABS Take 1 tablet (325 mg total) by mouth daily. 30  tablet 0   fluticasone (FLONASE) 50 MCG/ACT nasal spray Place into both nostrils as needed for allergies or rhinitis.      levocetirizine (XYZAL) 5 MG tablet TAKE 1 TABLET (5 MG TOTAL) BY MOUTH EVERY EVENING. 90 tablet 3   metoprolol succinate (TOPROL-XL) 50 MG 24 hr tablet Take 1 tablet (50 mg total) by mouth daily. Take with or immediately following a meal. 90 tablet 1   ondansetron (ZOFRAN-ODT) 4 MG disintegrating tablet DISSOLVE 1 TABLET (4 MG TOTAL) BY MOUTH EVERY 8 (EIGHT) HOURS AS NEEDED FOR NAUSEA. 30 tablet 3   Rimegepant Sulfate 75 MG TBDP TAKE 1 TABLET (75 MG) BY MOUTH DAILY AS NEEDED FOR MIGRAINES. TAKE AS CLOSE TO ONSET OF MIGRAINE AS POSSIBLE. ONE DAILY MAXIMUM. 8 tablet 6   rizatriptan (MAXALT-MLT) 10 MG disintegrating tablet Take 1 tablet (10 mg total) by mouth as needed for migraine. May repeat in 2 hours if needed 9 tablet 11   Vitamin D, Ergocalciferol, (DRISDOL) 1.25 MG (50000 UNIT) CAPS capsule Take 1 capsule (50,000 Units total) by mouth every 7 (seven) days. (Patient not taking: Reported on 06/11/2021) 12 capsule 0   No facility-administered medications prior to visit.    Allergies  Allergen Reactions   Codeine Other (See Comments)    REACTION: upset stomach once, but has taken it other times and been ok   Lisinopril Other (See Comments)    cough   Sulfa Antibiotics Hives    Review of Systems  Skin:        (+)mole on left hip      Objective:    Physical Exam Constitutional:      General: She is not in acute distress.    Appearance: Normal appearance. She is not ill-appearing.  HENT:     Head: Normocephalic and atraumatic.     Right Ear: External ear normal.     Left Ear: External ear normal.  Eyes:     Extraocular Movements: Extraocular movements intact.     Pupils: Pupils are equal, round, and reactive to light.  Skin:    General: Skin is warm and dry.     Comments: approximately 1 cm long nevus left hip   Neurological:     Mental Status: She is alert  and oriented to person, place, and time.  Psychiatric:        Behavior: Behavior normal.  BP 126/77 (BP Location: Right Arm, Patient Position: Sitting, Cuff Size: Small)   Pulse 74   Temp 98.2 F (36.8 C) (Oral)   Resp 16   Wt 196 lb (88.9 kg)   SpO2 100%   BMI 33.64 kg/m  Wt Readings from Last 3 Encounters:  07/30/21 196 lb (88.9 kg)  06/11/21 197 lb (89.4 kg)  04/25/21 200 lb (90.7 kg)       Assessment & Plan:   Problem List Items Addressed This Visit       Unprioritized   Nevus - Primary    Procedure including risks/benefits explained to patient.  Questions were answered. After informed consent was obtained and a time out completed, site was cleansed with betadine and then alcohol. 1% Lidocaine with epinephrine was injected under lesion and then shave biopsy was performed. Area was cauterized to obtain hemostasis.  Pt tolerated procedure well.  Specimen sent for pathology review.  Pt instructed to keep the area dry for 24 hours and to contact us if he develops redness, drainage or swelling at the site.  Pt may use tylenol as needed for discomfort today.           No orders of the defined types were placed in this encounter.   I, [name], personally preformed the services described in this documentation.  All medical record entries made by the scribe were at my direction and in my presence.  I have reviewed the chart and discharge instructions (if applicable) and agree that the record reflects my personal performance and is accurate and complete. 07/30/2021   I,Kaitlin Foster,acting as a Education administrator for Nance Pear, NP.,have documented all relevant documentation on the behalf of Nance Pear, NP,as directed by  Nance Pear, NP while in the presence of Nance Pear, NP.   Nance Pear, NP

## 2021-07-30 NOTE — Assessment & Plan Note (Signed)
Procedure including risks/benefits explained to patient.  Questions were answered. After informed consent was obtained and a time out completed, site was cleansed with betadine and then alcohol. 1% Lidocaine with epinephrine was injected under lesion and then shave biopsy was performed. Area was cauterized to obtain hemostasis.  Pt tolerated procedure well.  Specimen sent for pathology review.  Pt instructed to keep the area dry for 24 hours and to contact us if he develops redness, drainage or swelling at the site.  Pt may use tylenol as needed for discomfort today.

## 2021-07-30 NOTE — Addendum Note (Signed)
Addended by: Jiles Prows on: 07/30/2021 10:20 AM   Modules accepted: Orders

## 2021-08-08 ENCOUNTER — Other Ambulatory Visit (HOSPITAL_BASED_OUTPATIENT_CLINIC_OR_DEPARTMENT_OTHER): Payer: Self-pay

## 2021-08-29 ENCOUNTER — Ambulatory Visit (INDEPENDENT_AMBULATORY_CARE_PROVIDER_SITE_OTHER): Payer: No Typology Code available for payment source | Admitting: Family

## 2021-08-29 ENCOUNTER — Other Ambulatory Visit (HOSPITAL_BASED_OUTPATIENT_CLINIC_OR_DEPARTMENT_OTHER): Payer: Self-pay

## 2021-08-29 ENCOUNTER — Encounter: Payer: Self-pay | Admitting: Family

## 2021-08-29 ENCOUNTER — Telehealth: Payer: Self-pay | Admitting: Family

## 2021-08-29 ENCOUNTER — Other Ambulatory Visit: Payer: Self-pay

## 2021-08-29 VITALS — BP 124/87 | HR 87 | Temp 98.3°F | Resp 16 | Ht 63.0 in | Wt 199.0 lb

## 2021-08-29 DIAGNOSIS — E559 Vitamin D deficiency, unspecified: Secondary | ICD-10-CM | POA: Diagnosis not present

## 2021-08-29 DIAGNOSIS — Z0000 Encounter for general adult medical examination without abnormal findings: Secondary | ICD-10-CM | POA: Diagnosis not present

## 2021-08-29 DIAGNOSIS — D509 Iron deficiency anemia, unspecified: Secondary | ICD-10-CM | POA: Diagnosis not present

## 2021-08-29 DIAGNOSIS — Z23 Encounter for immunization: Secondary | ICD-10-CM

## 2021-08-29 DIAGNOSIS — I10 Essential (primary) hypertension: Secondary | ICD-10-CM

## 2021-08-29 LAB — CBC WITH DIFFERENTIAL/PLATELET
Basophils Absolute: 0 10*3/uL (ref 0.0–0.1)
Basophils Relative: 0.5 % (ref 0.0–3.0)
Eosinophils Absolute: 0.1 10*3/uL (ref 0.0–0.7)
Eosinophils Relative: 2.7 % (ref 0.0–5.0)
HCT: 41.8 % (ref 36.0–46.0)
Hemoglobin: 13.5 g/dL (ref 12.0–15.0)
Lymphocytes Relative: 29.4 % (ref 12.0–46.0)
Lymphs Abs: 1.6 10*3/uL (ref 0.7–4.0)
MCHC: 32.3 g/dL (ref 30.0–36.0)
MCV: 86.5 fl (ref 78.0–100.0)
Monocytes Absolute: 0.5 10*3/uL (ref 0.1–1.0)
Monocytes Relative: 8.6 % (ref 3.0–12.0)
Neutro Abs: 3.1 10*3/uL (ref 1.4–7.7)
Neutrophils Relative %: 58.8 % (ref 43.0–77.0)
Platelets: 265 10*3/uL (ref 150.0–400.0)
RBC: 4.83 Mil/uL (ref 3.87–5.11)
RDW: 14.3 % (ref 11.5–15.5)
WBC: 5.3 10*3/uL (ref 4.0–10.5)

## 2021-08-29 LAB — COMPREHENSIVE METABOLIC PANEL
ALT: 14 U/L (ref 0–35)
AST: 19 U/L (ref 0–37)
Albumin: 4.3 g/dL (ref 3.5–5.2)
Alkaline Phosphatase: 80 U/L (ref 39–117)
BUN: 16 mg/dL (ref 6–23)
CO2: 29 mEq/L (ref 19–32)
Calcium: 9.4 mg/dL (ref 8.4–10.5)
Chloride: 104 mEq/L (ref 96–112)
Creatinine, Ser: 0.89 mg/dL (ref 0.40–1.20)
GFR: 81.98 mL/min (ref >60.00)
Glucose, Bld: 96 mg/dL (ref 70–99)
Potassium: 4.2 mEq/L (ref 3.5–5.1)
Sodium: 140 mEq/L (ref 135–145)
Total Bilirubin: 0.6 mg/dL (ref 0.2–1.2)
Total Protein: 6.6 g/dL (ref 6.0–8.3)

## 2021-08-29 LAB — VITAMIN D 25 HYDROXY (VIT D DEFICIENCY, FRACTURES): VITD: 21.98 ng/mL — ABNORMAL LOW (ref 30.00–100.00)

## 2021-08-29 MED ORDER — VITAMIN D (ERGOCALCIFEROL) 1.25 MG (50000 UNIT) PO CAPS
50000.0000 [IU] | ORAL_CAPSULE | ORAL | 0 refills | Status: DC
  Filled 2021-08-29 – 2021-09-12 (×2): qty 12, 84d supply, fill #0

## 2021-08-29 NOTE — Progress Notes (Addendum)
Subjective:   By signing my name below, I, Zite Okoli, attest that this documentation has been prepared under the direction and in the presence of [name], NP 08/29/2021     Patient ID: Kaitlin Foster, female    DOB: Jul 26, 1981, 40 y.o.   MRN: [id number]  Chief Complaint  Patient presents with   Annual Exam         HPI Patient is in today for a comprehensive physical exam.  She denies having any unexpected weight change, ear pain, hearing loss and rhinorrhea, visual disturbance,chest pain and leg swelling, nausea, vomiting, diarrhea and blood in stool, or dysuria and frequency, arthralgias, rash, headaches, adenopathy, depression or anxiety at this time   Constipation- She reports having bouts of constipation but does not use medication unless it gets really bad. She has a history of hemorrhoids.  Back Pain- She reports intermittent back pain and pain in her legs. There is a family history of arthritis.  Headaches- She mentions that her headaches have been okay and they are more frequent during her menstrual cycles.  Cough- She still has a residual cough from her bout of Covid-19. She tried to treat it with Z-Pak but had sputum production that was yellow. She had an e-visit and it was resolved.  Anemia- She is still managing her anemia with 325 mg Ferrous sulfate.  Immunizations- She is willing to get the flu vaccine today. She has had 3 Covid-19 vaccines at this time.  She is UTD on the tetanus vaccine. She is not interested in the HIV screening.   Covid-19- She recently had Covid-19 in August 2022 and all the symptoms except cough have been resolved.  Pap Smear- Last completed on 03/10/2019. Results were normal. Repeat in 3 years.  Diet and Exercise- She has not been getting much exercise because she works from home now and sits at a desk all day.   Vision care- She is not UTD on vision care.  Dental care-  She is not UTD on dental care.  Social History-  There have been no recent changes in family history. She does not drink alcohol or use tobacco products.  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;   2 stage embolization ACA supply to AVM @ [facility]  Sept 11th and 18th 2008 then craniotomy resection AVM 07-28-2007   Hypertension    Left ureteral stone    Migraines    Nephrolithiasis    left renal nonobstructive stone per CT 06-14-2019   Uterine fibroid     Past Surgical History:  Procedure Laterality Date   CEREBRAL EMBOLIZATION  Sept 11th and 18th, 2008   '@[facility]'    2 staged embolization left ACA supply to AVM   CESAREAN SECTION  07/06/2009   CRANIOTOMY  07-29-2007   @ [facility]   resection left frontal AVM    Family History  Problem Relation Age of Onset   Hypertension Father    Alcohol abuse Father    Arthritis Father    Obesity Father    Hypertension Mother  Arthritis Mother    Migraines Mother    Obesity Mother    Cancer Mother        sinus tumor   Migraines Sister    Ulcerative colitis Sister    Migraines Maternal Aunt    ADD / ADHD Son    Cancer Maternal Grandmother        colon diagnosed at age 25   Dementia Maternal Grandmother    Heart disease Neg Hx     Social History   Socioeconomic History   Marital status: Married    Spouse name: Thad   Number of children: 1   Years of education: Not on file   Highest education level: Associate degree: academic program  Occupational History   Occupation: Therapist, sports  Tobacco Use   Smoking status: Never   Smokeless tobacco: Never  Vaping Use   Vaping Use:  Never used  Substance and Sexual Activity   Alcohol use: No    Alcohol/week: 0.0 standard drinks   Drug use: No   Sexual activity: Yes    Birth control/protection: Condom  Other Topics Concern   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 30 capsule 0   COVID-19 At Home Antigen Test (CARESTART COVID-19 HOME TEST) KIT Use as directed per package instructions 4 each 0   famotidine (PEPCID) 20 MG tablet Take 20 mg by mouth daily as needed for heartburn or indigestion.      Ferrous Sulfate (IRON) 325 (65 Fe) MG TABS Take 1 tablet (325 mg total) by mouth daily. 30 tablet 0   fluticasone (FLONASE) 50 MCG/ACT nasal spray Place into both nostrils as needed for allergies or rhinitis.      levocetirizine (XYZAL) 5 MG tablet TAKE 1 TABLET (5 MG TOTAL) BY MOUTH EVERY EVENING. 90 tablet 3   metoprolol succinate (TOPROL-XL) 50 MG 24 hr tablet Take 1 tablet (50 mg total) by mouth daily. Take with or immediately following a meal. 90 tablet 1   ondansetron (ZOFRAN-ODT) 4 MG disintegrating tablet DISSOLVE 1 TABLET (4 MG TOTAL) BY MOUTH EVERY 8 (EIGHT) HOURS AS NEEDED FOR NAUSEA. 30 tablet 3   Rimegepant Sulfate 75 MG TBDP TAKE 1 TABLET (75 MG) BY MOUTH DAILY AS NEEDED FOR MIGRAINES.  TAKE AS CLOSE TO ONSET OF MIGRAINE AS POSSIBLE. ONE DAILY MAXIMUM. 8 tablet 6   rizatriptan (MAXALT-MLT) 10 MG disintegrating tablet Take 1 tablet (10 mg total) by mouth as needed for migraine. May repeat in 2 hours if needed 9 tablet 11   Vitamin D, Ergocalciferol, (DRISDOL) 1.25 MG (50000 UNIT) CAPS capsule Take 1 capsule (50,000 Units total) by mouth every 7 (seven) days.  12 capsule 0   No facility-administered medications prior to visit.    Allergies  Allergen Reactions   Codeine Other (See Comments)    REACTION: upset stomach once, but has taken it other times and been ok   Lisinopril Other (See Comments)    cough   Sulfa Antibiotics Hives    Review of Systems  Constitutional:  Negative for fever.  HENT:  Negative for ear pain and hearing loss.        (-)nystagmus (-)adenopathy  Eyes:  Negative for blurred vision.  Respiratory:  Positive for cough. Negative for shortness of breath and wheezing.   Cardiovascular:  Negative for chest pain and leg swelling.  Gastrointestinal:  Negative for blood in stool, diarrhea, nausea and vomiting.  Genitourinary:  Negative for dysuria and frequency.  Musculoskeletal:  Positive for back pain and myalgias (legs). Negative for joint pain.  Skin:  Negative for rash.  Neurological:  Negative for headaches.  Psychiatric/Behavioral:  Negative for depression. The patient is not nervous/anxious.       Objective:    Physical Exam Constitutional:      General: She is not in acute distress.    Appearance: Normal appearance. She is not ill-appearing.  HENT:     Head: Normocephalic and atraumatic.     Right Ear: Tympanic membrane, ear canal and external ear normal.     Left Ear: Tympanic membrane, ear canal and external ear normal.  Eyes:     Extraocular Movements: Extraocular movements intact.     Pupils: Pupils are equal, round, and reactive to light.     Comments: No nystagmus  Cardiovascular:     Rate and Rhythm: Normal rate and regular  rhythm.     Pulses: Normal pulses.     Heart sounds: Normal heart sounds. No murmur heard. Pulmonary:     Effort: Pulmonary effort is normal. No respiratory distress.     Breath sounds: Normal breath sounds. No wheezing or rhonchi.  Abdominal:     General: Bowel sounds are normal. There is no distension.     Palpations: Abdomen is soft.     Tenderness: There is no abdominal tenderness. There is no guarding or rebound.  Musculoskeletal:     Cervical back: Neck supple.     Comments: 5/5 strength in upper and lower extremities   Lymphadenopathy:     Cervical: No cervical adenopathy.  Skin:    General: Skin is warm and dry.  Neurological:     Mental Status: She is alert and oriented to person, place, and time.     Deep Tendon Reflexes:     Reflex Scores:      Patellar reflexes are 3+ on the right side and 3+ on the left side. Psychiatric:        Behavior: Behavior normal.        Judgment: Judgment normal.    BP 124/87 (BP Location: Right Arm, Patient Position: Sitting, Cuff Size: Small)   Pulse 87   Temp 98.3 F (36.8 C) (Oral)   Resp 16   Ht '5\' 3"'  (1.6 m)   Wt 199 lb (90.3 kg)   SpO2 97%   BMI 35.25 kg/m  Wt Readings from Last 3 Encounters:  08/29/21 199 lb (90.3 kg)  07/30/21 196 lb (88.9 kg)  06/11/21 197 lb (89.4 kg)    Diabetic Foster Exam - Simple   No data filed    Lab Results  Component Value Date   WBC 6.8 01/22/2021  HGB 14.4 01/22/2021   HCT 43.5 01/22/2021   PLT 261.0 01/22/2021   GLUCOSE 93 01/22/2021   CHOL 151 08/22/2019   TRIG 125.0 08/22/2019   HDL 27.50 (L) 08/22/2019   LDLCALC 99 08/22/2019   ALT 15 08/28/2020   AST 17 08/28/2020   NA 138 01/22/2021   K 3.8 01/22/2021   CL 104 01/22/2021   CREATININE 0.80 01/22/2021   BUN 16 01/22/2021   CO2 29 01/22/2021   TSH 1.18 01/22/2021   INR 1.1 06/05/2007   HGBA1C 5.2 08/28/2020    Lab Results  Component Value Date   TSH 1.18 01/22/2021   Lab Results  Component Value Date   WBC 6.8  01/22/2021   HGB 14.4 01/22/2021   HCT 43.5 01/22/2021   MCV 86.5 01/22/2021   PLT 261.0 01/22/2021   Lab Results  Component Value Date   NA 138 01/22/2021   K 3.8 01/22/2021   CO2 29 01/22/2021   GLUCOSE 93 01/22/2021   BUN 16 01/22/2021   CREATININE 0.80 01/22/2021   BILITOT 0.3 08/28/2020   ALKPHOS 78 08/22/2019   AST 17 08/28/2020   ALT 15 08/28/2020   PROT 6.6 08/28/2020   ALBUMIN 4.3 08/22/2019   CALCIUM 9.4 01/22/2021   ANIONGAP 9 06/14/2019   GFR 93.57 01/22/2021   Lab Results  Component Value Date   CHOL 151 08/22/2019   Lab Results  Component Value Date   HDL 27.50 (L) 08/22/2019   Lab Results  Component Value Date   LDLCALC 99 08/22/2019   Lab Results  Component Value Date   TRIG 125.0 08/22/2019   Lab Results  Component Value Date   CHOLHDL 5 08/22/2019   Lab Results  Component Value Date   HGBA1C 5.2 08/28/2020       Assessment & Plan:   Problem List Items Addressed This Visit       Unprioritized   Vitamin D deficiency   Relevant Orders   Vitamin D (25 hydroxy)   Preventative health care - Primary    Pt will schedule vision/dental. Pap up to date.  Flu shot today. Recommended that she get the covid bivalent booster.       Hypertension   Relevant Orders   Comp Met (CMET)   Anemia   Relevant Orders   CBC with Differential/Platelet   Other Visit Diagnoses     Needs flu shot       Relevant Orders   Flu Vaccine QUAD 6+ mos PF IM (Fluarix Quad PF) (Completed)       No orders of the defined types were placed in this encounter.   I,Zite Okoli,acting as a Education administrator for [employer], NP.,have documented all relevant documentation on the behalf of Nance Pear, NP,as directed by  Nance Pear, NP while in the presence of Nance Pear, NP.   I, [name], NP, personally preformed the services described in this documentation.  All medical record entries made by the scribe were at my direction  and in my presence.  I have reviewed the chart and discharge instructions (if applicable) and agree that the record reflects my personal performance and is accurate and complete. 08/29/2021

## 2021-08-29 NOTE — Telephone Encounter (Signed)
Vitamin D level is low.  Advise patient to begin vit D 50000 units once weekly for 12 weeks, then repeat vit D level (dx Vit D deficiency).     

## 2021-08-29 NOTE — Telephone Encounter (Signed)
Patient advised of results and new prescription. She was also scheduled to have Vitamin d check in January.

## 2021-08-29 NOTE — Assessment & Plan Note (Addendum)
Pt will schedule vision/dental. Pap up to date.  Flu shot today. Recommended that she get the covid bivalent booster.

## 2021-08-29 NOTE — Patient Instructions (Signed)
Please complete lab work prior to leaving. Try to add regular exercise and continue your work on healthy diet, weight loss.

## 2021-09-02 ENCOUNTER — Ambulatory Visit: Payer: No Typology Code available for payment source | Admitting: Neurology

## 2021-09-08 ENCOUNTER — Other Ambulatory Visit (HOSPITAL_BASED_OUTPATIENT_CLINIC_OR_DEPARTMENT_OTHER): Payer: Self-pay

## 2021-09-11 ENCOUNTER — Ambulatory Visit (INDEPENDENT_AMBULATORY_CARE_PROVIDER_SITE_OTHER): Payer: No Typology Code available for payment source | Admitting: Neurology

## 2021-09-11 DIAGNOSIS — G43711 Chronic migraine without aura, intractable, with status migrainosus: Secondary | ICD-10-CM

## 2021-09-11 NOTE — Progress Notes (Signed)
Consent Form Botulism Toxin Injection For Chronic Migraine  05/27/2021: Stable  Interval history 09/11/2021: Baseline daily headaches and16 migraine days. Since last botox she has had only 2 total very mild migraines a month and acute management works, nurtec works with zofran, she had side effects to zembrace, also [place] works, takes with zofran. More than 25 free headache days a month, exceptional improvement!+all.  She is very happy. She is on adderall now but not causing headache.   Orders: [facility] or [place] for dry needling for cervical and lumbar/lumbosacral dry needling for myofascial disease in the past       Reviewed orally with patient, additionally signature is on file:  Botulism toxin has been approved by the Federal drug administration for treatment of chronic migraine. Botulism toxin does not cure chronic migraine and it may not be effective in some patients.  The administration of botulism toxin is accomplished by injecting a small amount of toxin into the muscles of the neck and head. Dosage must be titrated for each individual. Any benefits resulting from botulism toxin tend to wear off after 3 months with a repeat injection required if benefit is to be maintained. Injections are usually done every 3-4 months with maximum effect peak achieved by about 2 or 3 weeks. Botulism toxin is expensive and you should be sure of what costs you will incur resulting from the injection.  The side effects of botulism toxin use for chronic migraine may include:   -Transient, and usually mild, facial weakness with facial injections  -Transient, and usually mild, head or neck weakness with head/neck injections  -Reduction or loss of forehead facial animation due to forehead muscle weakness  -Eyelid drooping  -Dry eye  -Pain at the site of injection or bruising at the site of injection  -Double vision  -Potential unknown long term risks  Contraindications: You  should not have Botox if you are pregnant, nursing, allergic to albumin, have an infection, skin condition, or muscle weakness at the site of the injection, or have myasthenia gravis, Lambert-Eaton syndrome, or ALS.  It is also possible that as with any injection, there may be an allergic reaction or no effect from the medication. Reduced effectiveness after repeated injections is sometimes seen and rarely infection at the injection site may occur. All care will be taken to prevent these side effects. If therapy is given over a long time, atrophy and wasting in the muscle injected may occur. Occasionally the patient's become refractory to treatment because they develop antibodies to the toxin. In this event, therapy needs to be modified.  I have read the above information and consent to the administration of botulism toxin.    BOTOX PROCEDURE NOTE FOR MIGRAINE HEADACHE    Contraindications and precautions discussed with patient(above). Aseptic procedure was observed and patient tolerated procedure. Procedure performed by Dr. Georgia Dom  The condition has existed for more than 6 months, and pt does not have a diagnosis of ALS, Myasthenia Gravis or Lambert-Eaton Syndrome.  Risks and benefits of injections discussed and pt agrees to proceed with the procedure.  Written consent obtained  These injections are medically necessary. Pt  receives good benefits from these injections. These injections do not cause sedations or hallucinations which the oral therapies may cause.  Description of procedure:  The patient was placed in a sitting position. The standard protocol was used for Botox as follows, with 5 units of Botox injected at each site:   -Procerus muscle, midline injection  -  Corrugator muscle, bilateral injection  -Frontalis muscle, bilateral injection, with 2 sites each side, medial injection was performed in the upper one third of the frontalis muscle, in the region vertical from the  medial inferior edge of the superior orbital rim. The lateral injection was again in the upper one third of the forehead vertically above the lateral limbus of the cornea, 1.5 cm lateral to the medial injection site.  -Temporalis muscle injection, 5 sites, bilaterally. The first injection was 3 cm above the tragus of the ear, second injection site was 1.5 cm to 3 cm up from the first injection site in line with the tragus of the ear. The third injection site was 1.5-3 cm forward between the first 2 injection sites. The fourth injection site was 1.5 cm posterior to the second injection site. 5th site laterally in the temporalis  muscleat the level of the outer canthus.  - Patient feels her clenching is a trigger for headaches. +5 units masseter bilaterally   - Patient feels the migraines are centered around the eyes +5 units bilaterally at the outer canthus in the orbicularis occuli  -Occipitalis muscle injection, 3 sites, bilaterally. The first injection was done one half way between the occipital protuberance and the tip of the mastoid process behind the ear. The second injection site was done lateral and superior to the first, 1 fingerbreadth from the first injection. The third injection site was 1 fingerbreadth superiorly and medially from the first injection site.  -Cervical paraspinal muscle injection, 2 sites, bilateral knee first injection site was 1 cm from the midline of the cervical spine, 3 cm inferior to the lower border of the occipital protuberance. The second injection site was 1.5 cm superiorly and laterally to the first injection site.  -Trapezius muscle injection was performed at 3 sites, bilaterally. The first injection site was in the upper trapezius muscle halfway between the inflection point of the neck, and the acromion. The second injection site was one half way between the acromion and the first injection site. The third injection was done between the first injection site and the  inflection point of the neck.   Will return for repeat injection in 3 months.   A 200 unit sof Botox was used, any Botox not injected was wasted. The patient tolerated the procedure well, there were no complications of the above procedure.

## 2021-09-11 NOTE — Progress Notes (Signed)
Botox- 200 units x 1 vial Lot:[id number] Expiration:02/2024 NDC: 0023-3921-02  Bacteriostatic 0.9% Sodium Chloride- 66mL total Lot: [id number] Expiration:11/09/2022 NDC: 2707-8675-44  Dx:G43.711  B/B

## 2021-09-12 ENCOUNTER — Other Ambulatory Visit (HOSPITAL_BASED_OUTPATIENT_CLINIC_OR_DEPARTMENT_OTHER): Payer: Self-pay

## 2021-09-15 ENCOUNTER — Ambulatory Visit: Payer: No Typology Code available for payment source | Admitting: Neurology

## 2021-09-22 ENCOUNTER — Other Ambulatory Visit: Payer: Self-pay | Admitting: Pharmacist

## 2021-09-22 ENCOUNTER — Other Ambulatory Visit: Payer: Self-pay | Admitting: Family

## 2021-09-22 ENCOUNTER — Other Ambulatory Visit (HOSPITAL_BASED_OUTPATIENT_CLINIC_OR_DEPARTMENT_OTHER): Payer: Self-pay

## 2021-09-22 MED ORDER — CARESTART COVID-19 HOME TEST VI KIT
PACK | 0 refills | Status: DC
Start: 2021-09-22 — End: 2022-02-27
  Filled 2021-09-22: qty 2, 4d supply, fill #0

## 2021-09-22 MED ORDER — AMPHETAMINE-DEXTROAMPHET ER 30 MG PO CP24
ORAL_CAPSULE | Freq: Every morning | ORAL | 0 refills | Status: DC
  Filled 2021-09-22: qty 30, 30d supply, fill #0

## 2021-09-24 ENCOUNTER — Other Ambulatory Visit (HOSPITAL_BASED_OUTPATIENT_CLINIC_OR_DEPARTMENT_OTHER): Payer: Self-pay

## 2021-09-24 ENCOUNTER — Other Ambulatory Visit: Payer: Self-pay | Admitting: Neurology

## 2021-09-24 MED ORDER — NURTEC 75 MG PO TBDP
ORAL_TABLET | ORAL | 11 refills | Status: DC
  Filled 2021-09-24 – 2021-10-24 (×2): qty 16, 16d supply, fill #0
  Filled 2022-01-09: qty 8, 16d supply, fill #1
  Filled 2022-02-13: qty 8, 16d supply, fill #2
  Filled 2022-06-14: qty 8, 16d supply, fill #3
  Filled 2022-07-03: qty 8, 30d supply, fill #4
  Filled 2022-08-05: qty 8, 30d supply, fill #5
  Filled 2022-09-18: qty 8, 30d supply, fill #6

## 2021-10-03 ENCOUNTER — Other Ambulatory Visit (HOSPITAL_BASED_OUTPATIENT_CLINIC_OR_DEPARTMENT_OTHER): Payer: Self-pay

## 2021-10-24 ENCOUNTER — Other Ambulatory Visit: Payer: Self-pay | Admitting: Family

## 2021-10-24 ENCOUNTER — Other Ambulatory Visit (HOSPITAL_BASED_OUTPATIENT_CLINIC_OR_DEPARTMENT_OTHER): Payer: Self-pay

## 2021-10-24 MED ORDER — AMPHETAMINE-DEXTROAMPHET ER 30 MG PO CP24
ORAL_CAPSULE | Freq: Every morning | ORAL | 0 refills | Status: DC
  Filled 2021-10-24: qty 30, 30d supply, fill #0

## 2021-11-25 ENCOUNTER — Telehealth: Payer: Self-pay | Admitting: Neurology

## 2021-11-25 NOTE — Telephone Encounter (Signed)
Patient's next Botox appointment is 1/31. Patient's PA for Botox w/ [facility] expired 09/2021. I called [phone] & spoke with Jason Fila to start PA for [id number]. Dx: H47.654. Jason Fila states clinicals need to be faxed in. Notes were faxed for review once call ended.

## 2021-11-28 ENCOUNTER — Other Ambulatory Visit: Payer: No Typology Code available for payment source

## 2021-11-28 ENCOUNTER — Other Ambulatory Visit (INDEPENDENT_AMBULATORY_CARE_PROVIDER_SITE_OTHER): Payer: No Typology Code available for payment source

## 2021-11-28 DIAGNOSIS — E559 Vitamin D deficiency, unspecified: Secondary | ICD-10-CM

## 2021-12-01 ENCOUNTER — Encounter: Payer: Self-pay | Admitting: Family

## 2021-12-02 ENCOUNTER — Other Ambulatory Visit: Payer: Self-pay | Admitting: Family

## 2021-12-02 LAB — VITAMIN D 1,25 DIHYDROXY
Vitamin D 1, 25 (OH)2 Total: 35 pg/mL (ref 18–72)
Vitamin D2 1, 25 (OH)2: 25 pg/mL
Vitamin D3 1, 25 (OH)2: 10 pg/mL

## 2021-12-02 MED ORDER — VITAMIN D3 75 MCG (3000 UT) PO TABS: 1.0000 | ORAL_TABLET | Freq: Every day | ORAL | Status: DC

## 2021-12-02 NOTE — Progress Notes (Signed)
See lab note.  

## 2021-12-02 NOTE — Telephone Encounter (Signed)
Received approval from [facility]. PA #[id number] (11/26/21-11/25/22).

## 2021-12-04 ENCOUNTER — Other Ambulatory Visit: Payer: Self-pay | Admitting: Family

## 2021-12-05 ENCOUNTER — Other Ambulatory Visit (HOSPITAL_BASED_OUTPATIENT_CLINIC_OR_DEPARTMENT_OTHER): Payer: Self-pay

## 2021-12-05 MED ORDER — AMPHETAMINE-DEXTROAMPHET ER 30 MG PO CP24
ORAL_CAPSULE | Freq: Every morning | ORAL | 0 refills | Status: DC
  Filled 2021-12-05: qty 30, fill #0
  Filled 2021-12-10: qty 30, 30d supply, fill #0

## 2021-12-05 NOTE — Telephone Encounter (Signed)
Patient is requesting a refill of the following medications: Requested Prescriptions   Pending Prescriptions Disp Refills   amphetamine-dextroamphetamine (ADDERALL XR) 30 MG 24 hr capsule 30 capsule 0    Sig: TAKE 1 CAPSULE (30 MG TOTAL) BY MOUTH EVERY MORNING.    Date of patient request: 1/27/ Last office visit: 08/29/21 Date of last refill: 10/2021 Last refill amount: 30 + 1 Follow up time period per chart: 02/27/22  Contract: 08/29/21 UDS: 04/25/21

## 2021-12-09 ENCOUNTER — Ambulatory Visit: Payer: No Typology Code available for payment source | Admitting: Neurology

## 2021-12-10 ENCOUNTER — Other Ambulatory Visit (HOSPITAL_BASED_OUTPATIENT_CLINIC_OR_DEPARTMENT_OTHER): Payer: Self-pay

## 2021-12-25 ENCOUNTER — Ambulatory Visit (INDEPENDENT_AMBULATORY_CARE_PROVIDER_SITE_OTHER): Payer: No Typology Code available for payment source | Admitting: Neurology

## 2021-12-25 DIAGNOSIS — G43711 Chronic migraine without aura, intractable, with status migrainosus: Secondary | ICD-10-CM | POA: Diagnosis not present

## 2021-12-25 NOTE — Progress Notes (Signed)
Consent Form Botulism Toxin Injection For Chronic Migraine  12/25/2021: stable  05/27/2021: Stable  Interval history 09/11/2021: Baseline daily headaches and16 migraine days. Since last botox she has had only 2 total very mild migraines a month and acute management works, nurtec works with zofran, she had side effects to zembrace, also [place] works, takes with zofran. More than 25 free headache days a month, exceptional improvement!+all.  She is very happy. She is on adderall now but not causing headache.   Orders: [facility] or [place] for dry needling for cervical and lumbar/lumbosacral dry needling for myofascial disease in the past       Reviewed orally with patient, additionally signature is on file:  Botulism toxin has been approved by the Federal drug administration for treatment of chronic migraine. Botulism toxin does not cure chronic migraine and it may not be effective in some patients.  The administration of botulism toxin is accomplished by injecting a small amount of toxin into the muscles of the neck and head. Dosage must be titrated for each individual. Any benefits resulting from botulism toxin tend to wear off after 3 months with a repeat injection required if benefit is to be maintained. Injections are usually done every 3-4 months with maximum effect peak achieved by about 2 or 3 weeks. Botulism toxin is expensive and you should be sure of what costs you will incur resulting from the injection.  The side effects of botulism toxin use for chronic migraine may include:   -Transient, and usually mild, facial weakness with facial injections  -Transient, and usually mild, head or neck weakness with head/neck injections  -Reduction or loss of forehead facial animation due to forehead muscle weakness  -Eyelid drooping  -Dry eye  -Pain at the site of injection or bruising at the site of injection  -Double vision  -Potential unknown long term  risks  Contraindications: You should not have Botox if you are pregnant, nursing, allergic to albumin, have an infection, skin condition, or muscle weakness at the site of the injection, or have myasthenia gravis, Lambert-Eaton syndrome, or ALS.  It is also possible that as with any injection, there may be an allergic reaction or no effect from the medication. Reduced effectiveness after repeated injections is sometimes seen and rarely infection at the injection site may occur. All care will be taken to prevent these side effects. If therapy is given over a long time, atrophy and wasting in the muscle injected may occur. Occasionally the patient's become refractory to treatment because they develop antibodies to the toxin. In this event, therapy needs to be modified.  I have read the above information and consent to the administration of botulism toxin.    BOTOX PROCEDURE NOTE FOR MIGRAINE HEADACHE    Contraindications and precautions discussed with patient(above). Aseptic procedure was observed and patient tolerated procedure. Procedure performed by Dr. Georgia Dom  The condition has existed for more than 6 months, and pt does not have a diagnosis of ALS, Myasthenia Gravis or Lambert-Eaton Syndrome.  Risks and benefits of injections discussed and pt agrees to proceed with the procedure.  Written consent obtained  These injections are medically necessary. Pt  receives good benefits from these injections. These injections do not cause sedations or hallucinations which the oral therapies may cause.  Description of procedure:  The patient was placed in a sitting position. The standard protocol was used for Botox as follows, with 5 units of Botox injected at each site:   -Procerus  muscle, midline injection  -Corrugator muscle, bilateral injection  -Frontalis muscle, bilateral injection, with 2 sites each side, medial injection was performed in the upper one third of the frontalis muscle, in  the region vertical from the medial inferior edge of the superior orbital rim. The lateral injection was again in the upper one third of the forehead vertically above the lateral limbus of the cornea, 1.5 cm lateral to the medial injection site.  -Temporalis muscle injection, 5 sites, bilaterally. The first injection was 3 cm above the tragus of the ear, second injection site was 1.5 cm to 3 cm up from the first injection site in line with the tragus of the ear. The third injection site was 1.5-3 cm forward between the first 2 injection sites. The fourth injection site was 1.5 cm posterior to the second injection site. 5th site laterally in the temporalis  muscleat the level of the outer canthus.  - Patient feels her clenching is a trigger for headaches. +5 units masseter bilaterally   - Patient feels the migraines are centered around the eyes +5 units bilaterally at the outer canthus in the orbicularis occuli  -Occipitalis muscle injection, 3 sites, bilaterally. The first injection was done one half way between the occipital protuberance and the tip of the mastoid process behind the ear. The second injection site was done lateral and superior to the first, 1 fingerbreadth from the first injection. The third injection site was 1 fingerbreadth superiorly and medially from the first injection site.  -Cervical paraspinal muscle injection, 2 sites, bilateral knee first injection site was 1 cm from the midline of the cervical spine, 3 cm inferior to the lower border of the occipital protuberance. The second injection site was 1.5 cm superiorly and laterally to the first injection site.  -Trapezius muscle injection was performed at 3 sites, bilaterally. The first injection site was in the upper trapezius muscle halfway between the inflection point of the neck, and the acromion. The second injection site was one half way between the acromion and the first injection site. The third injection was done between the  first injection site and the inflection point of the neck.   Will return for repeat injection in 3 months.   A 200 unit sof Botox was used, any Botox not injected was wasted. The patient tolerated the procedure well, there were no complications of the above procedure.

## 2021-12-25 NOTE — Progress Notes (Signed)
Botox- 200 units x 1 vial Lot: [id number] Expiration: 02/2024 NDC: 1642-9037-95  Bacteriostatic 0.9% Sodium Chloride- 70mL total Lot: [id number] Expiration: 12/10/2022 NDC: 4255-2589-48  Dx: X47.583 B/B

## 2022-01-09 ENCOUNTER — Other Ambulatory Visit: Payer: Self-pay | Admitting: Family

## 2022-01-09 ENCOUNTER — Other Ambulatory Visit (HOSPITAL_BASED_OUTPATIENT_CLINIC_OR_DEPARTMENT_OTHER): Payer: Self-pay

## 2022-01-09 MED ORDER — AMPHETAMINE-DEXTROAMPHET ER 30 MG PO CP24
ORAL_CAPSULE | Freq: Every morning | ORAL | 0 refills | Status: DC
  Filled 2022-01-09: qty 30, 30d supply, fill #0

## 2022-01-09 MED ORDER — AMLODIPINE BESYLATE 5 MG PO TABS
ORAL_TABLET | Freq: Every day | ORAL | 1 refills | Status: DC
  Filled 2022-01-09: qty 90, 90d supply, fill #0
  Filled 2022-04-20: qty 90, 90d supply, fill #1

## 2022-01-09 MED ORDER — METOPROLOL SUCCINATE ER 50 MG PO TB24
50.0000 mg | ORAL_TABLET | Freq: Every day | ORAL | 1 refills | Status: DC
  Filled 2022-01-09: qty 90, 90d supply, fill #0
  Filled 2022-04-20: qty 90, 90d supply, fill #1

## 2022-01-09 NOTE — Telephone Encounter (Signed)
Requesting: Adderall XR 30mg   ?Contract:09/17/2020 ?UDS: 04/25/2021 ?Last Visit: 08/29/2021 ?Next Visit: 02/27/2022 ?Last Refill: 12/05/2021 #30 and 0RF ? ?Please Advise ? ?

## 2022-02-13 ENCOUNTER — Other Ambulatory Visit: Payer: Self-pay | Admitting: Family

## 2022-02-13 ENCOUNTER — Other Ambulatory Visit (HOSPITAL_BASED_OUTPATIENT_CLINIC_OR_DEPARTMENT_OTHER): Payer: Self-pay

## 2022-02-16 ENCOUNTER — Other Ambulatory Visit (HOSPITAL_BASED_OUTPATIENT_CLINIC_OR_DEPARTMENT_OTHER): Payer: Self-pay

## 2022-02-16 MED ORDER — AMPHETAMINE-DEXTROAMPHET ER 30 MG PO CP24
ORAL_CAPSULE | Freq: Every morning | ORAL | 0 refills | Status: DC
  Filled 2022-02-16: qty 30, 30d supply, fill #0

## 2022-02-27 ENCOUNTER — Other Ambulatory Visit (HOSPITAL_BASED_OUTPATIENT_CLINIC_OR_DEPARTMENT_OTHER): Payer: Self-pay

## 2022-02-27 ENCOUNTER — Ambulatory Visit (INDEPENDENT_AMBULATORY_CARE_PROVIDER_SITE_OTHER): Payer: No Typology Code available for payment source | Admitting: Family

## 2022-02-27 DIAGNOSIS — I10 Essential (primary) hypertension: Secondary | ICD-10-CM

## 2022-02-27 DIAGNOSIS — F900 Attention-deficit hyperactivity disorder, predominantly inattentive type: Secondary | ICD-10-CM | POA: Diagnosis not present

## 2022-02-27 MED ORDER — LISDEXAMFETAMINE DIMESYLATE 30 MG PO CAPS
30.0000 mg | ORAL_CAPSULE | Freq: Every day | ORAL | 0 refills | Status: DC
  Filled 2022-02-27: qty 30, 30d supply, fill #0

## 2022-02-27 NOTE — Assessment & Plan Note (Deleted)
BP Readings from Last 3 Encounters:  ?02/27/22 121/90  ?08/29/21 124/87  ?07/30/21 126/77  ? ?Fair BP, continue toprol xl and amlodipine.  ?

## 2022-02-27 NOTE — Assessment & Plan Note (Addendum)
Uncontrolled. D/c adderall, start vyvanse '30mg'$ . She believes that she also has binge eating disorder so it may help with that.  ?

## 2022-02-27 NOTE — Progress Notes (Signed)
? ?Subjective:  ? ?By signing my name below, I, [name], attest that this documentation has been prepared under the direction and in the presence of [name], 02/27/2022   ? ? Patient ID: Kaitlin Foster, female    DOB: 1981-04-30, 41 y.o.   MRN: [id number] ? ?Chief Complaint  ?Patient presents with  ? Hypertension  ?  Here for follow up  ? ADHD  ?  Follow up  ? Anxiety  ?  Here for follow up anxiety with depression  ? ? ?HPI ?Patient is in today for an office visit. ? ?Blood Pressure - As of today's visit, her blood pressure is 130/82. She is currently taking 5 MG of Amlodipine and 50 MG of Metoprolol Succinate.  ?BP Readings from Last 3 Encounters:  ?02/27/22 130/82  ?08/29/21 124/87  ?07/30/21 126/77  ? ?Attention/Anxiety - She is doing okay in terms of attention. She does have the occasional anxiety symptoms. She has been taking 30 MG of Adderall XR. She does not notice the medication waring off. She likes the aspect of the appetite suppressor from Adderall XR. ? ?Migraines - Her migraine symptoms have been improving.  She is regularly seeing a neurologist.  ? ?Environmental Allergies - Her allergies are not good. She is taking Zizole to help alleviate symptoms.  ? ?Health Maintenance Due  ?Topic Date Due  ? COVID-19 Vaccine (4 - Booster) 12/13/2020  ? PAP SMEAR-Modifier  03/09/2022  ? ? ?Past Medical History:  ?Diagnosis Date  ? ADHD, 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  ? Anxiety   ? Chronic migraine   ? neurologist-  dr Jaynee Eagles--- botox injecitons every 3 months  ? GERD (gastroesophageal reflux disease)   ? History of arteriovenous malformation (AVM) (06-16-2019-  per pt residual very very mild aphsia and slight facial droop)  ? 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 Hypertension   ? Left  ureteral stone   ? Migraines   ? Nephrolithiasis   ? left renal nonobstructive stone per CT 06-14-2019  ? Uterine fibroid   ? ? ?Past Surgical History:  ?Procedure Laterality Date  ? CEREBRAL EMBOLIZATION  Sept 11th and 18th, 2008   _0   ? 2 staged embolization left ACA supply to AVM  ? CESAREAN SECTION  07/06/2009  ? CRANIOTOMY  07-29-2007   @ [facility]  ? resection left frontal AVM  ? ? ?Family History  ?Problem Relation Age of Onset  ? Hypertension Father   ? Alcohol abuse Father   ? Arthritis Father   ? Obesity Father   ? Hypertension Mother   ? Arthritis Mother   ? Migraines Mother   ? Obesity Mother   ? Cancer Mother   ?     sinus tumor  ? Migraines Sister   ? Ulcerative colitis Sister   ? Migraines Maternal Aunt   ? ADD / ADHD Son   ? Cancer Maternal Grandmother   ?     colon diagnosed at age 54  ? Dementia Maternal Grandmother   ? Heart disease Neg Hx   ? ? ?Social History  ? ?Socioeconomic History  ? Marital status: Married  ?  Spouse name: Thad  ? Number of children: 1  ? Years of education: Not on file  ? Highest  education level: Associate degree: academic program  ?Occupational History  ? Occupation: Therapist, sports  ?Tobacco Use  ? Smoking status: Never  ? Smokeless tobacco: Never  ?Vaping Use  ? Vaping Use: Never used  ?Substance and Sexual Activity  ? Alcohol use: No  ?  Alcohol/week: 0.0 standard drinks  ? Drug use: No  ? Sexual activity: Yes  ?  Birth control/protection: Condom  ?Other Topics Concern  ? Not on file  ?Social History Narrative  ? Regular exercise: no regular exercise  ? Caffeine use:  1-2 coffee daily  ? Son born 2010  ? 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 Married  ? Lives at home with her husband and child  ?   ? ?Social Determinants of Health  ? ?Financial Resource Strain: Not on file  ?Food Insecurity: Not on file  ?Transportation Needs: Not on file  ?Physical Activity: Not on file  ?Stress: Not on file  ?Social Connections: Not on file  ?Intimate Partner Violence: Not on file   ? ? ?Outpatient Medications Prior to Visit  ?Medication Sig Dispense Refill  ? amLODipine (NORVASC) 5 MG tablet TAKE 1 TABLET BY MOUTH ONCE DAILY 90 tablet 1  ? amphetamine-dextroamphetamine (ADDERALL XR) 30 MG 24 hr capsule TAKE 1 CAPSULE (30 MG TOTAL) BY MOUTH EVERY MORNING. 30 capsule 0  ? Cholecalciferol (VITAMIN D3) 75 MCG (3000 UT) TABS Take 1 tablet by mouth daily. 30 tablet   ? docusate sodium (COLACE) 50 MG capsule Take 50 mg by mouth 2 (two) times daily.    ? famotidine (PEPCID) 20 MG tablet Take 20 mg by mouth daily as needed for heartburn or indigestion.     ? Ferrous Sulfate (IRON) 325 (65 Fe) MG TABS Take 1 tablet (325 mg total) by mouth daily. 30 tablet 0  ? fluticasone (FLONASE) 50 MCG/ACT nasal spray Place into both nostrils as needed for allergies or rhinitis.     ? levocetirizine (XYZAL) 5 MG tablet TAKE 1 TABLET (5 MG TOTAL) BY MOUTH EVERY EVENING. 90 tablet 3  ? metoprolol succinate (TOPROL-XL) 50 MG 24 hr tablet Take 1 tablet (50 mg total) by mouth daily. Take with or immediately following a meal. 90 tablet 1  ? Rimegepant Sulfate (NURTEC) 75 MG TBDP TAKE 1 TABLET (75 MG) BY MOUTH DAILY AS NEEDED FOR MIGRAINES. TAKE AS CLOSE TO ONSET OF MIGRAINE AS POSSIBLE. ONE DAILY MAXIMUM. 16 tablet 11  ? rizatriptan (MAXALT-MLT) 10 MG disintegrating tablet Take 1 tablet (10 mg total) by mouth as needed for migraine. May repeat in 2 hours if needed 9 tablet 11  ? COVID-19 At Home Antigen Test [facility] COVID-19 HOME TEST) KIT Use as directed per package instructions 4 each 0  ? COVID-19 At Home Antigen Test (CARESTART COVID-19 HOME TEST) KIT Use as directed within package instructions 2 kit 0  ? ?No facility-administered medications prior to visit.  ? ? ?Allergies  ?Allergen Reactions  ? Codeine Other (See Comments)  ?  REACTION: upset stomach once, but has taken it other times and been ok  ? Lisinopril Other (See Comments)  ?  cough  ? Sulfa Antibiotics Hives  ? ? ?Review of Systems   ?Endo/Heme/Allergies:  Positive for environmental allergies.  ? ?   ?Objective:  ?  ?Physical Exam ?Constitutional:   ?   General: She is not in acute distress. ?   Appearance: Normal appearance. She is not ill-appearing.  ?HENT:  ?   Head: Normocephalic and  atraumatic.  ?   Right Ear: External ear normal.  ?   Left Ear: External ear normal.  ?Eyes:  ?   Extraocular Movements: Extraocular movements intact.  ?   Pupils: Pupils are equal, round, and reactive to light.  ?Cardiovascular:  ?   Rate and Rhythm: Normal rate and regular rhythm.  ?   Heart sounds: Normal heart sounds. No murmur heard. ?  No gallop.  ?Pulmonary:  ?   Effort: Pulmonary effort is normal. No respiratory distress.  ?   Breath sounds: Normal breath sounds. No wheezing or rales.  ?Skin: ?   General: Skin is warm and dry.  ?Neurological:  ?   Mental Status: She is alert and oriented to person, place, and time.  ?Psychiatric:     ?   Mood and Affect: Mood normal.     ?   Behavior: Behavior normal.     ?   Judgment: Judgment normal.  ? ? ?BP 130/82   Pulse 74   Temp 98.3 ?F (36.8 ?C) (Oral)   Resp 16   Ht _0  (1.626 m)   Wt 192 lb (87.1 kg)   SpO2 99%   BMI 32.96 kg/m?  ?Wt Readings from Last 3 Encounters:  ?02/27/22 192 lb (87.1 kg)  ?08/29/21 199 lb (90.3 kg)  ?07/30/21 196 lb (88.9 kg)  ? ? ?   ?Assessment & Plan:  ? ?Problem List Items Addressed This Visit   ? ?  ? Unprioritized  ? Hypertension  ?  BP Readings from Last 3 Encounters:  ?02/27/22 130/82  ?08/29/21 124/87  ?07/30/21 126/77  ?Fair BP. Continue metoprolol/amlodipine.  ?  ?  ? ADHD, predominantly inattentive type  ?  Uncontrolled. D/c adderall, start vyvanse 66m. She believes that she also has binge eating disorder so it may help with that.  ? ?  ?  ? ?Other Visit Diagnoses   ? ? Essential hypertension, benign      ? ?  ? ? ? ? ?Meds ordered this encounter  ?Medications  ? lisdexamfetamine (VYVANSE) 30 MG capsule  ?  Sig: Take 1 capsule (30 mg total) by mouth daily.  ?   Dispense:  30 capsule  ?  Refill:  0  ?  Order Specific Question:   Supervising Provider  ?  Answer:   BPenni HomansA [[[id number]] ? ? ?I, MNance Pear NP, personally preformed the services described in this documentation.  All medical reco

## 2022-02-27 NOTE — Patient Instructions (Signed)
Stop Adderall, start Vyvanse.  ?

## 2022-02-27 NOTE — Assessment & Plan Note (Addendum)
BP Readings from Last 3 Encounters:  ?02/27/22 130/82  ?08/29/21 124/87  ?07/30/21 126/77  ? ?Fair BP. Continue metoprolol/amlodipine.  ?

## 2022-03-16 LAB — HM PAP SMEAR: HM Pap smear: NEGATIVE

## 2022-03-19 ENCOUNTER — Ambulatory Visit (INDEPENDENT_AMBULATORY_CARE_PROVIDER_SITE_OTHER): Payer: No Typology Code available for payment source | Admitting: Neurology

## 2022-03-19 DIAGNOSIS — G43711 Chronic migraine without aura, intractable, with status migrainosus: Secondary | ICD-10-CM

## 2022-03-19 NOTE — Progress Notes (Signed)
? ?Consent Form ?Botulism Toxin Injection For Chronic Migraine ?03/19/2022: stable, doing great ?12/25/2021: stable ? ?05/27/2021: Stable ? ?Interval history 09/11/2021: Baseline daily headaches and16 migraine days. Since last botox she has had only 2 total very mild migraines a month and acute management works, [employer]tec works with zofran, she had side effects to zembrace, also [place] works, takes with zofran. More than 25 free headache days a month, exceptional improvement!.  She is very happy. She is on adderall now but not causing headache.  ? ?Orders: [facility] or [place] for dry needling for cervical and lumbar/lumbosacral dry needling for myofascial disease in the past ? ? ? ? ? ? ?Reviewed orally with patient, additionally signature is on file: ? ?Botulism toxin has been approved by the Federal drug administration for treatment of chronic migraine. Botulism toxin does not cure chronic migraine and it may not be effective in some patients. ? ?The administration of botulism toxin is accomplished by injecting a small amount of toxin into the muscles of the neck and head. Dosage must be titrated for each individual. Any benefits resulting from botulism toxin tend to wear off after 3 months with a repeat injection required if benefit is to be maintained. Injections are usually done every 3-4 months with maximum effect peak achieved by about 2 or 3 weeks. Botulism toxin is expensive and you should be sure of what costs you will incur resulting from the injection. ? ?The side effects of botulism toxin use for chronic migraine may include: ? ? -Transient, and usually mild, facial weakness with facial injections ? -Transient, and usually mild, head or neck weakness with head/neck injections ? -Reduction or loss of forehead facial animation due to forehead muscle weakness ? -Eyelid drooping ? -Dry eye ? -Pain at the site of injection or bruising at the site of injection ? -Double vision ? -Potential unknown  long term risks ? ?Contraindications: You should not have Botox if you are pregnant, nursing, allergic to albumin, have an infection, skin condition, or muscle weakness at the site of the injection, or have myasthenia gravis, Lambert-Eaton syndrome, or ALS. ? ?It is also possible that as with any injection, there may be an allergic reaction or no effect from the medication. Reduced effectiveness after repeated injections is sometimes seen and rarely infection at the injection site may occur. All care will be taken to prevent these side effects. If therapy is given over a long time, atrophy and wasting in the muscle injected may occur. Occasionally the patient's become refractory to treatment because they develop antibodies to the toxin. In this event, therapy needs to be modified. ? ?I have read the above information and consent to the administration of botulism toxin. ? ? ? ?BOTOX PROCEDURE NOTE FOR MIGRAINE HEADACHE ? ? ? ?Contraindications and precautions discussed with patient(above). Aseptic procedure was observed and patient tolerated procedure. Procedure performed by Dr. Georgia Dom ? ?The condition has existed for more than 6 months, and pt does not have a diagnosis of ALS, Myasthenia Gravis or Lambert-Eaton Syndrome.  Risks and benefits of injections discussed and pt agrees to proceed with the procedure.  Written consent obtained ? ?These injections are medically necessary. Pt  receives good benefits from these injections. These injections do not cause sedations or hallucinations which the oral therapies may cause. ? ?Description of procedure: ? ?The patient was placed in a sitting position. The standard protocol was used for Botox as follows, with 5 units of Botox injected at each site: ? ? ?-  Procerus muscle, midline injection ? ?-Corrugator muscle, bilateral injection ? ?-Frontalis muscle, bilateral injection, with 2 sites each side, medial injection was performed in the upper one third of the frontalis  muscle, in the region vertical from the medial inferior edge of the superior orbital rim. The lateral injection was again in the upper one third of the forehead vertically above the lateral limbus of the cornea, 1.5 cm lateral to the medial injection site. ? ?-Temporalis muscle injection, 5 sites, bilaterally. The first injection was 3 cm above the tragus of the ear, second injection site was 1.5 cm to 3 cm up from the first injection site in line with the tragus of the ear. The third injection site was 1.5-3 cm forward between the first 2 injection sites. The fourth injection site was 1.5 cm posterior to the second injection site. 5th site laterally in the temporalis  muscleat the level of the outer canthus. ? ?- Patient feels her clenching is a trigger for headaches. +5 units masseter bilaterally  ? ?- Patient feels the migraines are centered around the eyes +5 units bilaterally at the outer canthus in the orbicularis occuli ? ?-Occipitalis muscle injection, 3 sites, bilaterally. The first injection was done one half way between the occipital protuberance and the tip of the mastoid process behind the ear. The second injection site was done lateral and superior to the first, 1 fingerbreadth from the first injection. The third injection site was 1 fingerbreadth superiorly and medially from the first injection site. ? ?-Cervical paraspinal muscle injection, 2 sites, bilateral knee first injection site was 1 cm from the midline of the cervical spine, 3 cm inferior to the lower border of the occipital protuberance. The second injection site was 1.5 cm superiorly and laterally to the first injection site. ? ?-Trapezius muscle injection was performed at 3 sites, bilaterally. The first injection site was in the upper trapezius muscle halfway between the inflection point of the neck, and the acromion. The second injection site was one half way between the acromion and the first injection site. The third injection was done  between the first injection site and the inflection point of the neck. ? ? ?Will return for repeat injection in 3 months. ? ? ?A 200 unit sof Botox was used, 45 U Botox not injected was wasted. The patient tolerated the procedure well, there were no complications of the above procedure. ? ? ? ?

## 2022-03-19 NOTE — Progress Notes (Signed)
Botox- 200 units x 1 vial ?Lot: [id number] ?Expiration: 10/2024 ?[place]: [phone] ? ?Bacteriostatic 0.9% Sodium Chloride- 34m total ?Lot: [id number] ?expiration: 06/10/2023 ?[place] [id number]? ?Dx: GH72.902?B/B  ?

## 2022-03-25 ENCOUNTER — Telehealth: Payer: Self-pay | Admitting: *Deleted

## 2022-03-25 NOTE — Telephone Encounter (Signed)
Completed Nurtec PA on Cover My Meds. Key: [id number]. Approved immediately.  ? ?The authorization is effective for a maximum of 12 fills from 03/25/2022 to 03/25/2023, as long as the member is enrolled in their current health plan. The request was approved with a quantity restriction. This has been approved for a max daily dosage of 0.6. A written notification letter will follow with additional details. ?

## 2022-03-27 ENCOUNTER — Other Ambulatory Visit (HOSPITAL_BASED_OUTPATIENT_CLINIC_OR_DEPARTMENT_OTHER): Payer: Self-pay

## 2022-03-27 ENCOUNTER — Ambulatory Visit (INDEPENDENT_AMBULATORY_CARE_PROVIDER_SITE_OTHER): Payer: No Typology Code available for payment source | Admitting: Family

## 2022-03-27 ENCOUNTER — Encounter: Payer: Self-pay | Admitting: Family

## 2022-03-27 DIAGNOSIS — F900 Attention-deficit hyperactivity disorder, predominantly inattentive type: Secondary | ICD-10-CM | POA: Diagnosis not present

## 2022-03-27 MED ORDER — LISDEXAMFETAMINE DIMESYLATE 40 MG PO CAPS
40.0000 mg | ORAL_CAPSULE | ORAL | 0 refills | Status: DC
  Filled 2022-03-27: qty 30, 30d supply, fill #0

## 2022-03-27 NOTE — Assessment & Plan Note (Signed)
Uncontrolled. Recommended that she increase vyvanse to '40mg'$  once daily. Follow back up in 1 month.

## 2022-03-27 NOTE — Progress Notes (Signed)
Subjective:     Patient ID: Kaitlin Foster, female    DOB: 17-Aug-1981, 41 y.o.   MRN: [id number]  Chief Complaint  Patient presents with   Follow-up    HPI Patient is in today for follow up.  She notes that her appetite is a "little suppressed."  She notices that it seems to wear off faster than the adderall and perhaps she has a little less focus on the vyvanse.  BP Readings from Last 3 Encounters:  03/27/22 135/79  02/27/22 130/82  08/29/21 124/87   Wt Readings from Last 3 Encounters:  03/27/22 199 lb (90.3 kg)  02/27/22 192 lb (87.1 kg)  08/29/21 199 lb (90.3 kg)       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;   2 stage embolization ACA supply to AVM @ [facility]  Sept 11th and 18th 2008 then craniotomy resection AVM 07-28-2007   Hypertension    Left ureteral stone    Migraines    Nephrolithiasis    left renal nonobstructive stone per CT 06-14-2019   Uterine fibroid     Past Surgical History:  Procedure Laterality Date   CEREBRAL EMBOLIZATION  Sept 11th and 18th, 2008   '@[facility]'$    2 staged embolization left ACA supply to AVM   CESAREAN SECTION  07/06/2009   CRANIOTOMY  07-29-2007   @ [facility]   resection left frontal AVM    Family History  Problem Relation Age of Onset   Hypertension Father    Alcohol abuse Father    Arthritis Father    Obesity Father    Hypertension Mother    Arthritis Mother    Migraines Mother    Obesity  Mother    Cancer Mother        sinus tumor   Migraines Sister    Ulcerative colitis Sister    Migraines Maternal Aunt    ADD / ADHD Son    Cancer Maternal Grandmother        colon diagnosed at age 82   Dementia Maternal Grandmother    Heart disease Neg Hx     Social History   Socioeconomic History   Marital status: Married    Spouse name: Thad   Number of children: 1   Years of education: Not on file   Highest education level: Associate degree: academic program  Occupational History   Occupation: Therapist, sports  Tobacco Use   Smoking status: Never   Smokeless tobacco: Never  Vaping Use   Vaping Use: Never used  Substance and Sexual Activity   Alcohol use: No    Alcohol/week: 0.0 standard drinks   Drug use: No   Sexual activity: Yes    Birth control/protection: Condom  Other Topics Concern   Not on file  Social History Narrative   Regular exercise: no regular exercise   Caffeine use:  1-2 coffee daily   Son born 2010  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 30 tablet    docusate sodium (COLACE) 50 MG capsule Take 50 mg by mouth 2 (two) times daily.     famotidine (PEPCID) 20 MG tablet Take 20 mg by mouth daily as needed for heartburn or indigestion.      Ferrous Sulfate (IRON) 325 (65 Fe) MG TABS Take 1 tablet (325 mg total) by mouth daily. 30 tablet 0   fluticasone (FLONASE) 50 MCG/ACT nasal spray Place into both  nostrils as needed for allergies or rhinitis.      levocetirizine (XYZAL) 5 MG tablet TAKE 1 TABLET (5 MG TOTAL) BY MOUTH EVERY EVENING. 90 tablet 3   metoprolol succinate (TOPROL-XL) 50 MG 24 hr tablet Take 1 tablet (50 mg total) by mouth daily. Take with or immediately following a meal. 90 tablet 1   Rimegepant Sulfate (NURTEC) 75 MG TBDP TAKE 1 TABLET (75 MG) BY MOUTH DAILY AS NEEDED FOR MIGRAINES. TAKE AS CLOSE TO ONSET OF MIGRAINE AS POSSIBLE. ONE DAILY MAXIMUM. 16 tablet 11   rizatriptan (MAXALT-MLT) 10 MG disintegrating tablet Take 1 tablet (10 mg total) by mouth as needed for migraine. May repeat in 2 hours if needed 9 tablet 11   lisdexamfetamine (VYVANSE) 30 MG capsule Take 1 capsule (30 mg total) by mouth daily. 30 capsule 0   amphetamine-dextroamphetamine (ADDERALL XR) 30 MG 24 hr capsule TAKE 1 CAPSULE (30 MG TOTAL) BY MOUTH EVERY MORNING. (Patient not taking: Reported on 03/27/2022) 30 capsule 0   No facility-administered medications prior to visit.    Allergies  Allergen Reactions   Codeine Other (See Comments)    REACTION: upset stomach once, but has taken it other times and been ok   Lisinopril Other (See Comments)    cough   Sulfa Antibiotics Hives    ROS    See HPI Objective:    Physical Exam Constitutional:      Appearance: Normal appearance.  Skin:    General: Skin is warm and dry.  Neurological:     General: No focal deficit present.     Mental Status: She is alert and oriented to person, place, and time.  Psychiatric:        Mood and Affect: Mood normal.        Thought Content: Thought content normal.        Judgment: Judgment normal.    BP 135/79 (BP Location: Right Arm, Patient Position: Sitting, Cuff Size: Normal)   Pulse 70   Resp 20   Ht '5\' 4"'$  (1.626 m)   Wt 199 lb (90.3 kg)   SpO2 100%   BMI 34.16 kg/m  Wt Readings from Last 3 Encounters:  03/27/22 199 lb (90.3 kg)  02/27/22 192 lb (87.1 kg)  08/29/21 199 lb (90.3 kg)       Assessment  &  Plan:   Problem List Items Addressed This Visit       Unprioritized   ADHD, predominantly inattentive type    Uncontrolled. Recommended that she increase vyvanse to '40mg'$  once daily. Follow back up in 1 month.         I have discontinued Vallorie H. Bungert's amphetamine-dextroamphetamine and lisdexamfetamine. I am also having her start on lisdexamfetamine. Additionally, I am having her maintain her rizatriptan, fluticasone, famotidine, Iron, levocetirizine, Nurtec, Vitamin D3, amLODipine, metoprolol succinate, and docusate sodium.  Meds ordered this encounter  Medications   lisdexamfetamine (VYVANSE) 40 MG capsule    Sig: Take 1 capsule (40 mg total) by mouth every morning.    Dispense:  30 capsule    Refill:  0    Order Specific Question:   Supervising Provider    Answer:   Penni Homans A [[id number]]

## 2022-03-27 NOTE — Patient Instructions (Signed)
Please increase vyvanse to '40mg'$  once daily.

## 2022-04-20 ENCOUNTER — Other Ambulatory Visit (HOSPITAL_BASED_OUTPATIENT_CLINIC_OR_DEPARTMENT_OTHER): Payer: Self-pay

## 2022-04-22 ENCOUNTER — Other Ambulatory Visit (HOSPITAL_BASED_OUTPATIENT_CLINIC_OR_DEPARTMENT_OTHER): Payer: Self-pay

## 2022-04-28 ENCOUNTER — Other Ambulatory Visit (HOSPITAL_BASED_OUTPATIENT_CLINIC_OR_DEPARTMENT_OTHER): Payer: Self-pay

## 2022-04-28 ENCOUNTER — Ambulatory Visit (INDEPENDENT_AMBULATORY_CARE_PROVIDER_SITE_OTHER): Payer: No Typology Code available for payment source | Admitting: Family

## 2022-04-28 DIAGNOSIS — E669 Obesity, unspecified: Secondary | ICD-10-CM | POA: Diagnosis not present

## 2022-04-28 DIAGNOSIS — F900 Attention-deficit hyperactivity disorder, predominantly inattentive type: Secondary | ICD-10-CM

## 2022-04-28 MED ORDER — WEGOVY 0.25 MG/0.5ML ~~LOC~~ SOAJ
0.2500 mg | SUBCUTANEOUS | 0 refills | Status: DC
  Filled 2022-04-28: qty 2, 28d supply, fill #0

## 2022-04-28 MED ORDER — LISDEXAMFETAMINE DIMESYLATE 50 MG PO CAPS
50.0000 mg | ORAL_CAPSULE | Freq: Every day | ORAL | 0 refills | Status: DC
  Filled 2022-04-28: qty 30, 30d supply, fill #0

## 2022-04-28 NOTE — Patient Instructions (Signed)
Please increase vyvanse from '40mg'$  to '50mg'$ .  Start wegovy 0.'25mg'$  SQ once weekly.

## 2022-04-28 NOTE — Assessment & Plan Note (Signed)
Uncontrolled.  She is interested in starting [place].  Rx sent for 0.'25mg'$ .

## 2022-04-28 NOTE — Assessment & Plan Note (Addendum)
Wt Readings from Last 3 Encounters:  04/28/22 202 lb (91.6 kg)  03/27/22 199 lb (90.3 kg)  02/27/22 192 lb (87.1 kg)   Not optimized. Will increase vyvanse to '50mg'$ .

## 2022-04-28 NOTE — Progress Notes (Signed)
Subjective:   By signing my name below, I, [name], attest that this documentation has been prepared under the direction and in the presence of [name], 04/28/2022    Patient ID: Kaitlin Foster, female    DOB: 01-Aug-1981, 41 y.o.   MRN: [id number]  Chief Complaint  Patient presents with   ADHD    Here for follow up    HPI Patient is in today for an office visit.  ADHD/Eating: She is currently taking 40 Mg of Vyvanse. She does not feel a difference in focus from when she previously took 30 Mg. She also notes that she felt as though her diet was more suppressed when she was previously taking Adderall XR. She states that her binge eating is based on emotions. Diabetic Weight Loss Drug: She is interested in starting [facility].   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;   2 stage embolization ACA supply to AVM @ [facility]  Sept 11th and 18th 2008 then craniotomy resection AVM 07-28-2007   Hypertension    Left ureteral stone    Migraines    Nephrolithiasis    left renal nonobstructive stone per CT 06-14-2019   Uterine fibroid     Past Surgical History:  Procedure Laterality Date   CEREBRAL EMBOLIZATION  Sept 11th and 18th, 2008   '@[facility]'$    2 staged embolization left ACA supply to AVM   CESAREAN SECTION  07/06/2009   CRANIOTOMY  07-29-2007   @ [facility]   resection left frontal AVM    Family History  Problem Relation Age of Onset    Hypertension Father    Alcohol abuse Father    Arthritis Father    Obesity Father    Hypertension Mother    Arthritis Mother    Migraines Mother    Obesity Mother    Cancer Mother        sinus tumor   Migraines Sister    Ulcerative colitis Sister    Migraines Maternal Aunt    ADD / ADHD Son    Cancer Maternal Grandmother        colon diagnosed at age 45   Dementia Maternal Grandmother    Heart disease Neg Hx     Social History   Socioeconomic History   Marital status: Married    Spouse name: Thad   Number of children: 1   Years of education: Not on file   Highest education level: Associate degree: academic program  Occupational History   Occupation: Therapist, sports  Tobacco Use   Smoking status: Never   Smokeless tobacco: Never  Vaping Use   Vaping Use: Never used  Substance and Sexual Activity   Alcohol use: No    Alcohol/week: 0.0 standard drinks of alcohol   Drug use: No   Sexual activity: Yes    Birth control/protection: Condom  Other Topics Concern   Not on file  Social  History Narrative   Regular exercise: no regular exercise   Caffeine use:  1-2 coffee daily   Son born 2010   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 30 tablet    docusate sodium (COLACE) 50 MG capsule Take 50 mg by mouth 2 (two) times daily.     famotidine (PEPCID) 20 MG tablet Take 20 mg by mouth daily as needed for heartburn or  indigestion.      Ferrous Sulfate (IRON) 325 (65 Fe) MG TABS Take 1 tablet (325 mg total) by mouth daily. 30 tablet 0   fluticasone (FLONASE) 50 MCG/ACT nasal spray Place into both nostrils as needed for allergies or rhinitis.      levocetirizine (XYZAL) 5 MG tablet TAKE 1 TABLET (5 MG TOTAL) BY MOUTH EVERY EVENING. 90 tablet 3   metoprolol succinate (TOPROL-XL) 50 MG 24 hr tablet Take 1 tablet (50 mg total) by mouth daily. Take with or immediately following a meal. 90 tablet 1   Rimegepant Sulfate (NURTEC) 75 MG TBDP TAKE 1 TABLET (75 MG) BY MOUTH DAILY AS NEEDED FOR MIGRAINES. TAKE AS CLOSE TO ONSET OF MIGRAINE AS POSSIBLE. ONE DAILY MAXIMUM. 16 tablet 11   rizatriptan (MAXALT-MLT) 10 MG disintegrating tablet Take 1 tablet (10 mg total) by mouth as needed for migraine. May repeat in 2 hours if needed 9 tablet 11   lisdexamfetamine (VYVANSE) 40 MG capsule Take 1 capsule (40 mg total) by mouth every morning. 30 capsule 0   No facility-administered medications prior to visit.    Allergies  Allergen Reactions   Codeine Other (See Comments)    REACTION: upset stomach once, but has taken it other times and been ok   Lisinopril Other (See Comments)    cough   Sulfa Antibiotics Hives    ROS     Objective:    Physical Exam Constitutional:      General: She is not in acute distress.    Appearance: Normal appearance. She is not ill-appearing.  HENT:     Head: Normocephalic and atraumatic.     Right Ear: External ear normal.     Left Ear: External ear normal.  Eyes:     Extraocular Movements: Extraocular movements intact.     Pupils: Pupils are equal, round, and reactive to light.  Cardiovascular:     Rate and Rhythm: Normal rate and regular rhythm.     Heart sounds: Normal heart sounds. No murmur heard.    No gallop.  Pulmonary:     Effort: Pulmonary effort is normal. No respiratory distress.     Breath sounds: Normal breath sounds. No wheezing or rales.  Skin:    General: Skin is  warm and dry.  Neurological:     Mental Status: She is alert and oriented to person, place, and time.  Psychiatric:  Mood and Affect: Mood normal.        Behavior: Behavior normal.        Judgment: Judgment normal.     BP 137/83 (BP Location: Right Arm, Patient Position: Sitting, Cuff Size: Small)   Pulse 74   Temp 98.4 F (36.9 C) (Oral)   Resp 16   Wt 202 lb (91.6 kg)   SpO2 100%   BMI 34.67 kg/m  Wt Readings from Last 3 Encounters:  04/28/22 202 lb (91.6 kg)  03/27/22 199 lb (90.3 kg)  02/27/22 192 lb (87.1 kg)       Assessment & Plan:   Problem List Items Addressed This Visit       Unprioritized   Obesity (BMI 30-39.9)    Uncontrolled.  She is interested in starting [place].  Rx sent for 0.'25mg'$ .      Relevant Medications   lisdexamfetamine (VYVANSE) 50 MG capsule   Semaglutide-Weight Management (WEGOVY) 0.25 MG/0.5ML SOAJ   ADHD, predominantly inattentive type    Wt Readings from Last 3 Encounters:  04/28/22 202 lb (91.6 kg)  03/27/22 199 lb (90.3 kg)  02/27/22 192 lb (87.1 kg)  Not optimized. Will increase vyvanse to '50mg'$ .         Meds ordered this encounter  Medications   lisdexamfetamine (VYVANSE) 50 MG capsule    Sig: Take 1 capsule (50 mg total) by mouth daily.    Dispense:  30 capsule    Refill:  0    Order Specific Question:   Supervising Provider    Answer:   Penni Homans A [[id number]]   Semaglutide-Weight Management (WEGOVY) 0.25 MG/0.5ML SOAJ    Sig: Inject 0.25 mg into the skin once a week.    Dispense:  2 mL    Refill:  0    Order Specific Question:   Supervising Provider    Answer:   Penni Homans A [[id number]]    I, Nance Pear, NP, personally preformed the services described in this documentation.  All medical record entries made by the scribe were at my direction and in my presence.  I have reviewed the chart and discharge instructions (if applicable) and agree that the record reflects my personal performance and is accurate  and complete. 04/28/2022   I,Jerzee Collins,acting as a scribe for Nance Pear, NP.,have documented all relevant documentation on the behalf of Nance Pear, NP,as directed by  Nance Pear, NP while in the presence of Nance Pear, NP.  Nance Pear, NP

## 2022-04-29 ENCOUNTER — Other Ambulatory Visit (HOSPITAL_BASED_OUTPATIENT_CLINIC_OR_DEPARTMENT_OTHER): Payer: Self-pay

## 2022-04-29 ENCOUNTER — Telehealth: Payer: Self-pay | Admitting: *Deleted

## 2022-04-29 NOTE — Telephone Encounter (Signed)
Prior auth started via cover my meds.  Awaiting determination.  Key: [id number]

## 2022-04-30 ENCOUNTER — Other Ambulatory Visit (HOSPITAL_BASED_OUTPATIENT_CLINIC_OR_DEPARTMENT_OTHER): Payer: Self-pay

## 2022-05-01 ENCOUNTER — Other Ambulatory Visit (HOSPITAL_BASED_OUTPATIENT_CLINIC_OR_DEPARTMENT_OTHER): Payer: Self-pay

## 2022-05-05 ENCOUNTER — Other Ambulatory Visit (HOSPITAL_BASED_OUTPATIENT_CLINIC_OR_DEPARTMENT_OTHER): Payer: Self-pay

## 2022-05-29 ENCOUNTER — Other Ambulatory Visit (HOSPITAL_BASED_OUTPATIENT_CLINIC_OR_DEPARTMENT_OTHER): Payer: Self-pay

## 2022-05-29 ENCOUNTER — Telehealth (INDEPENDENT_AMBULATORY_CARE_PROVIDER_SITE_OTHER): Payer: No Typology Code available for payment source | Admitting: Family

## 2022-05-29 VITALS — Wt 194.0 lb

## 2022-05-29 DIAGNOSIS — E669 Obesity, unspecified: Secondary | ICD-10-CM

## 2022-05-29 DIAGNOSIS — J069 Acute upper respiratory infection, unspecified: Secondary | ICD-10-CM

## 2022-05-29 DIAGNOSIS — F900 Attention-deficit hyperactivity disorder, predominantly inattentive type: Secondary | ICD-10-CM | POA: Diagnosis not present

## 2022-05-29 MED ORDER — WEGOVY 0.5 MG/0.5ML ~~LOC~~ SOAJ
0.5000 mg | SUBCUTANEOUS | 0 refills | Status: DC
  Filled 2022-05-29 – 2022-06-04 (×2): qty 2, 28d supply, fill #0

## 2022-05-29 MED ORDER — LISDEXAMFETAMINE DIMESYLATE 50 MG PO CAPS
50.0000 mg | ORAL_CAPSULE | Freq: Every day | ORAL | 0 refills | Status: DC
  Filled 2022-05-29: qty 30, 30d supply, fill #0

## 2022-05-29 NOTE — Assessment & Plan Note (Signed)
Tolerating wegovy 0.'25mg'$  and seeing nice weight loss results.  Will increase to 0.'5mg'$  weekly. She will reach out to me in 1 month to let me know how she is doing on the [facility].  Wt Readings from Last 3 Encounters:  05/29/22 194 lb (88 kg)  04/28/22 202 lb (91.6 kg)  03/27/22 199 lb (90.3 kg)

## 2022-05-29 NOTE — Progress Notes (Signed)
MyChart Video Visit    Virtual Visit via Video Note   This visit type was conducted due to national recommendations for restrictions regarding the COVID-19 Pandemic (e.g. social distancing) in an effort to limit this patient's exposure and mitigate transmission in our community. This patient is at least at moderate risk for complications without adequate follow up. This format is felt to be most appropriate for this patient at this time. Physical exam was limited by quality of the video and audio technology used for the visit. CMA was able to get the patient set up on a video visit.  Patient location: Home Patient and provider in visit Provider location: Office  I discussed the limitations of evaluation and management by telemedicine and the availability of in person appointments. The patient expressed understanding and agreed to proceed.  Visit Date: 05/29/2022  Today's healthcare provider: Nance Pear, NP     Subjective:    Patient ID: Kaitlin Foster, female    DOB: April 24, 1981, 41 y.o.   MRN: [id number]  Chief Complaint  Patient presents with   ADHD    Follow up   Obesity    Follow up    HPI Patient is in today for a virtual office visit.  Sickness - She reports that she is experiencing chills, coughing and runny nose. She denies of doing a Covid-19 test. Vyvanse - Her Vyvanse was increased  from 40 Mg to 50 Mg. She states that her attention capacity is better and wants to continue the 50 Mg.  ZDGLOV - She started 0.25 Mg/0.5 mL of Wegovy four weeks ago. She was recently on vacation and forgot to take the medication for about a week. She is experiencing mild nausea. She states that she weighed herself on 05/28/2022 and reports a weight of 194 lbs. 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;   2 stage embolization ACA supply to AVM @ [facility]  Sept 11th and 18th 2008 then craniotomy resection AVM 07-28-2007   Hypertension    Left ureteral stone    Migraines    Nephrolithiasis    left renal nonobstructive stone per CT 06-14-2019   Uterine fibroid     Past Surgical History:  Procedure Laterality Date   CEREBRAL EMBOLIZATION  Sept 11th and 18th, 2008   '@[facility]'$    2 staged embolization left ACA supply to AVM   CESAREAN SECTION  07/06/2009   CRANIOTOMY  07-29-2007   @ [facility]   resection left frontal AVM    Family History  Problem Relation Age of Onset   Hypertension Father    Alcohol abuse Father    Arthritis Father    Obesity Father    Hypertension Mother    Arthritis Mother    Migraines Mother    Obesity Mother    Cancer Mother        sinus tumor   Migraines Sister  Ulcerative colitis Sister    Migraines Maternal Aunt    ADD / ADHD Son    Cancer Maternal Grandmother        colon diagnosed at age 46   Dementia Maternal Grandmother    Heart disease Neg Hx     Social History   Socioeconomic History   Marital status: Married    Spouse name: Thad   Number of children: 1   Years of education: Not on file   Highest education level: Associate degree: academic program  Occupational History   Occupation: Therapist, sports  Tobacco Use   Smoking status: Never   Smokeless tobacco: Never  Vaping Use   Vaping Use: Never used  Substance and Sexual Activity   Alcohol use: No    Alcohol/week: 0.0 standard drinks of alcohol   Drug use: No   Sexual activity: Yes    Birth control/protection: Condom  Other Topics  Concern   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 30 tablet    docusate sodium (COLACE) 50 MG capsule Take 50 mg by mouth 2 (two) times daily.     famotidine (PEPCID) 20 MG tablet Take 20 mg by mouth daily as needed for heartburn or indigestion.      Ferrous Sulfate (IRON) 325 (65 Fe) MG TABS Take 1 tablet (325 mg total) by mouth daily. 30 tablet 0   fluticasone (FLONASE) 50 MCG/ACT nasal spray Place into both nostrils as needed for allergies or rhinitis.      levocetirizine (XYZAL) 5 MG tablet TAKE 1 TABLET (5 MG TOTAL) BY MOUTH EVERY EVENING. 90 tablet 3   metoprolol succinate (TOPROL-XL) 50 MG 24 hr tablet Take 1 tablet (50 mg total) by mouth daily. Take with or immediately following a meal. 90 tablet 1   Rimegepant Sulfate (NURTEC) 75 MG TBDP TAKE 1 TABLET (75 MG) BY MOUTH DAILY AS NEEDED FOR MIGRAINES. TAKE AS CLOSE TO ONSET OF MIGRAINE AS POSSIBLE. ONE DAILY MAXIMUM. 16 tablet 11   rizatriptan (MAXALT-MLT) 10 MG disintegrating tablet Take 1 tablet (10 mg total) by mouth as needed for migraine. May repeat in 2 hours if needed 9 tablet 11   lisdexamfetamine (VYVANSE) 50 MG capsule Take 1 capsule (50 mg total) by mouth daily. 30 capsule 0   Semaglutide-Weight Management (WEGOVY) 0.25 MG/0.5ML SOAJ  Inject 0.25 mg into the skin once a week. 2 mL 0   No facility-administered medications prior to visit.    Allergies  Allergen Reactions   Codeine Other (See Comments)    REACTION: upset stomach once, but has taken it other times and been ok   Lisinopril Other (See Comments)    cough  Sulfa Antibiotics Hives    Review of Systems  Constitutional:  Positive for chills.  HENT:         (+) Runny Nose  Respiratory:  Positive for cough.   Gastrointestinal:  Positive for nausea (Mild).       Objective:    Physical Exam  Wt 194 lb (88 kg)   BMI 33.30 kg/m  Wt Readings from Last 3 Encounters:  05/29/22 194 lb (88 kg)  04/28/22 202 lb (91.6 kg)  03/27/22 199 lb (90.3 kg)    Gen: Awake, alert, no acute distress- appears tired/congested Resp: Breathing is even and non-labored Psych: calm/pleasant demeanor Neuro: Alert and Oriented x 3, + facial symmetry, speech is clear.     Assessment & Plan:   Problem List Items Addressed This Visit       Unprioritized   Viral URI with cough - Primary    New.  She is managing with supportive care measures. I encouraged her to take a covid-19 test.       Obesity (BMI 30-39.9)    Tolerating wegovy 0.'25mg'$  and seeing nice weight loss results.  Will increase to 0.'5mg'$  weekly. She will reach out to me in 1 month to let me know how she is doing on the [facility].  Wt Readings from Last 3 Encounters:  05/29/22 194 lb (88 kg)  04/28/22 202 lb (91.6 kg)  03/27/22 199 lb (90.3 kg)         Relevant Medications   Semaglutide-Weight Management (WEGOVY) 0.5 MG/0.5ML SOAJ   lisdexamfetamine (VYVANSE) 50 MG capsule   ADHD, predominantly inattentive type    Improved concentration on vyvanse '50mg'$  dose. Continue same.         Meds ordered this encounter  Medications   Semaglutide-Weight Management (WEGOVY) 0.5 MG/0.5ML SOAJ    Sig: Inject 0.5 mg into the skin once a week.    Dispense:  2 mL    Refill:  0    Order Specific Question:    Supervising Provider    Answer:   Penni Homans A [[id number]]   lisdexamfetamine (VYVANSE) 50 MG capsule    Sig: Take 1 capsule (50 mg total) by mouth daily.    Dispense:  30 capsule    Refill:  0    Order Specific Question:   Supervising Provider    Answer:   Penni Homans A [[id number]]    I discussed the assessment and treatment plan with the patient. The patient was provided an opportunity to ask questions and all were answered. The patient agreed with the plan and demonstrated an understanding of the instructions.   The patient was advised to call back or seek an in-person evaluation if the symptoms worsen or if the condition fails to improve as anticipated.  I provided 20 minutes of face-to-face time during this encounter.   I,Eudora Collins,acting as a Education administrator for [employer], NP.,have documented all relevant documentation on the behalf of Nance Pear, NP,as directed by  Nance Pear, NP while in the presence of Nance Pear, NP.   Nance Pear, NP [employer] [facility] [employer] [phone] (phone) [phone] (fax)  [place]

## 2022-05-29 NOTE — Assessment & Plan Note (Signed)
Improved concentration on vyvanse '50mg'$  dose. Continue same.

## 2022-05-29 NOTE — Assessment & Plan Note (Signed)
New.  She is managing with supportive care measures. I encouraged her to take a covid-19 test.

## 2022-05-31 ENCOUNTER — Emergency Department
Admission: EM | Admit: 2022-05-31 | Discharge: 2022-05-31 | Disposition: A | Discharge status: Home / Self Care | Payer: No Typology Code available for payment source | Attending: Family Medicine | Admitting: Family Medicine

## 2022-05-31 ENCOUNTER — Encounter: Payer: Self-pay | Admitting: Emergency Medicine

## 2022-05-31 DIAGNOSIS — J019 Acute sinusitis, unspecified: Secondary | ICD-10-CM | POA: Diagnosis not present

## 2022-05-31 MED ORDER — PREDNISONE 20 MG PO TABS: 40.0000 mg | ORAL_TABLET | Freq: Every day | ORAL | 0 refills | Status: DC

## 2022-05-31 MED ORDER — FLUTICASONE PROPIONATE 50 MCG/ACT NA SUSP: 2.0000 | Freq: Every day | NASAL | 0 refills | Status: AC

## 2022-05-31 MED ORDER — KETOROLAC TROMETHAMINE 60 MG/2ML IM SOLN
60.0000 mg | Freq: Once | INTRAMUSCULAR | Status: AC
  Administered 2022-05-31: 60 mg via INTRAMUSCULAR

## 2022-05-31 NOTE — ED Provider Notes (Signed)
[facility]    CSN: [id number] Arrival date & time: 05/31/22  1436      History   Chief Complaint Chief Complaint  Patient presents with   URI    HPI Kaitlin Foster is a 41 y.o. female.   HPI  Patient has been sick for 4 days.  COVID test at home is negative.  Her son also has a cold.  She has some sinus pressure and pain, minor sore throat, minor coughing.  No fever or chills.  She has had some generalized body aches.  She is having significant sinus pressure and headache.  She feels like it is triggered a migraine.  She has tried her usual migraine medications that is not helping.  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;   2 stage embolization ACA supply to AVM @ [facility]  Sept 11th and 18th 2008 then craniotomy resection AVM 07-28-2007   Hypertension    Left ureteral stone    Migraines    Nephrolithiasis    left renal nonobstructive stone per CT 06-14-2019   Uterine fibroid     Patient Active Problem List   Diagnosis Date Noted   Fatigue 02/19/2021   Palpitations 02/19/2021   Obesity (BMI 30-39.9) 02/19/2021   Uterine fibroid    Migraines    Hypertension    History of arteriovenous malformation (AVM)    GERD (gastroesophageal reflux disease)    Anxiety    ADHD, 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   Chronic migraine without aura, with intractable migraine, so stated, with status migrainosus 06/13/2018   Vitamin D deficiency 08/30/2017   Insulin resistance 08/30/2017   Class 1 obesity due to excess calories with body mass index  (BMI) of 32.0 to 32.9 in adult 08/30/2017   Preventative health care 07/01/2017   Allergic rhinitis 06/25/2015   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   Anemia 03/21/2014   Migraine 11/18/2012   General medical examination 02/29/2012   Anxiety and depression 02/29/2012   DYSLIPIDEMIA 10/12/2008   Viral URI with cough 04/12/2008    Past Surgical History:  Procedure Laterality Date   CEREBRAL EMBOLIZATION  Sept 11th and 18th, 2008   '@[facility]'$    2 staged embolization left ACA supply to AVM   CESAREAN SECTION  07/06/2009   CRANIOTOMY  07-29-2007   @ [facility]   resection left frontal AVM    OB History     Gravida  1   Para      Term      Preterm      AB      Living  1      SAB      IAB      Ectopic      Multiple      Live Births               Home Medications    Prior to Admission medications   Medication Sig Start Date End Date Taking? Authorizing Provider  fluticasone (FLONASE) 50 MCG/ACT nasal spray Place 2 sprays into both nostrils daily. 05/31/22  Yes Raylene Everts, MD  predniSONE (DELTASONE) 20 MG tablet Take 2 tablets (40 mg total) by mouth daily with breakfast. 05/31/22  Yes Raylene Everts, MD  amLODipine (NORVASC) 5 MG tablet TAKE 1 TABLET BY MOUTH ONCE DAILY 01/09/22 01/09/23  [name], NP  Cholecalciferol (VITAMIN D3) 75 MCG (3000 UT) TABS Take 1 tablet by mouth daily. 12/02/21   [name], NP  docusate sodium (COLACE) 50 MG capsule Take 50 mg by mouth 2 (two) times daily.    [provider]  famotidine (PEPCID) 20 MG tablet Take 20 mg by mouth daily as needed for heartburn or indigestion.     [provider]  Ferrous Sulfate (IRON) 325 (65 Fe) MG TABS Take 1 tablet (325 mg total) by mouth daily. 08/30/20   [name], NP  levocetirizine (XYZAL) 5 MG tablet TAKE 1 TABLET (5 MG TOTAL) BY MOUTH EVERY EVENING. 06/20/21 07/19/22  [name], NP  lisdexamfetamine (VYVANSE) 50 MG capsule Take 1 capsule (50 mg total)  by mouth daily. 05/29/22   [name], NP  metoprolol succinate (TOPROL-XL) 50 MG 24 hr tablet Take 1 tablet (50 mg total) by mouth daily. Take with or immediately following a meal. 01/09/22   [name], NP  Rimegepant Sulfate (NURTEC) 75 MG TBDP TAKE 1 TABLET (75 MG) BY MOUTH DAILY AS NEEDED FOR MIGRAINES. TAKE AS CLOSE TO ONSET OF MIGRAINE AS POSSIBLE. ONE DAILY MAXIMUM. 09/24/21 09/24/22  Melvenia Beam, MD  rizatriptan (MAXALT-MLT) 10 MG disintegrating tablet Take 1 tablet (10 mg total) by mouth as needed for migraine. May repeat in 2 hours if needed 05/02/19   Melvenia Beam, MD  Semaglutide-Weight Management [facility]) 0.5 MG/0.5ML SOAJ Inject 0.5 mg into the skin once a week. 05/29/22   [name], NP    Family History Family History  Problem Relation Age of Onset   Hypertension Father    Alcohol abuse Father    Arthritis Father    Obesity Father    Hypertension Mother    Arthritis Mother    Migraines Mother    Obesity Mother    Cancer Mother        sinus tumor   Migraines Sister    Ulcerative colitis Sister    Migraines Maternal Aunt    ADD / ADHD Son    Cancer Maternal Grandmother        colon diagnosed at age 70   Dementia Maternal Grandmother    Heart disease Neg Hx     Social History Social History   Tobacco Use   Smoking status: Never   Smokeless tobacco: Never  Vaping Use   Vaping Use: Never used  Substance Use Topics   Alcohol use: No    Alcohol/week: 0.0 standard drinks of alcohol   Drug use: No     Allergies   Codeine, Lisinopril, and Sulfa antibiotics   Review of Systems Review of Systems See HPI  Physical Exam Triage Vital Signs ED Triage Vitals  Enc Vitals Group     BP 05/31/22 1456 123/85     Pulse Rate 05/31/22 1456 85     Resp 05/31/22 1456 16     Temp 05/31/22 1456 99.3 F (37.4 C)     Temp Source 05/31/22 1456 Oral     SpO2 05/31/22 1456 97 %     Weight 05/31/22 1457 194 lb (88 kg)     Height 05/31/22  1457 '5\' 4"'$  (1.626 m)     Head Circumference --  Peak Flow --      Pain Score 05/31/22 1456 3     Pain Loc --      Pain Edu? --      Excl. in [place]? --    No data found.  Updated Vital Signs BP 123/85 (BP Location: Right Arm)   Pulse 85   Temp 99.3 F (37.4 C) (Oral)   Resp 16   Ht '5\' 4"'$  (1.626 m)   Wt 88 kg   LMP 05/17/2022 (Approximate)   SpO2 97%   BMI 33.30 kg/m      Physical Exam Constitutional:      General: She is not in acute distress.    Appearance: She is well-developed. She is ill-appearing.  HENT:     Head: Normocephalic and atraumatic.     Right Ear: Tympanic membrane and ear canal normal.     Left Ear: Tympanic membrane and ear canal normal.     Nose: Congestion present. No rhinorrhea.     Mouth/Throat:     Mouth: Mucous membranes are moist.     Pharynx: No posterior oropharyngeal erythema.     Comments: Tender facial sinuses left greater than right Eyes:     Conjunctiva/sclera: Conjunctivae normal.     Pupils: Pupils are equal, round, and reactive to light.  Cardiovascular:     Rate and Rhythm: Normal rate and regular rhythm.     Heart sounds: Normal heart sounds.  Pulmonary:     Effort: Pulmonary effort is normal. No respiratory distress.     Breath sounds: Normal breath sounds.  Abdominal:     General: There is no distension.     Palpations: Abdomen is soft.  Musculoskeletal:        General: Normal range of motion.     Cervical back: Normal range of motion.  Lymphadenopathy:     Cervical: Cervical adenopathy present.  Skin:    General: Skin is warm and dry.  Neurological:     Mental Status: She is alert.  Psychiatric:        Mood and Affect: Mood normal.        Behavior: Behavior normal.      UC Treatments / Results  Labs (all labs ordered are listed, but only abnormal results are displayed) Labs Reviewed - No data to display  EKG   Radiology No results found.  Procedures Procedures (including critical care  time)  Medications Ordered in UC Medications  ketorolac (TORADOL) injection 60 mg (60 mg Intramuscular Given 05/31/22 1515)    Initial Impression / Assessment and Plan / UC Course  I have reviewed the triage vital signs and the nursing notes.  Pertinent labs & imaging results that were available during my care of the patient were reviewed by me and considered in my medical decision making (see chart for details).    I believe this is a viral illness.  Exacerbated by allergies.  Which is exacerbated her migraine.  We will give her a shot of Toradol and some prednisone.  Follow-up with PCP Final Clinical Impressions(s) / UC Diagnoses   Final diagnoses:  Acute sinusitis, recurrence not specified, unspecified location     Discharge Instructions      Continue to drink lots of fluids Consider taking Mucinex (or generic) Take prednisone once a day for 5 days Restart your Flonase Call your doctor if not improving in a couple days   ED Prescriptions     Medication Sig Dispense Auth. Provider   fluticasone [facility]encion [facility])  50 MCG/ACT nasal spray Place 2 sprays into both nostrils daily. 16 g Raylene Everts, MD   predniSONE (DELTASONE) 20 MG tablet Take 2 tablets (40 mg total) by mouth daily with breakfast. 10 tablet Raylene Everts, MD      PDMP not reviewed this encounter.   Raylene Everts, MD 05/31/22 [phone]

## 2022-05-31 NOTE — ED Triage Notes (Signed)
Cold symptoms started on Thursday  Generalized body aches w/ HA  Negative COVID  test on Friday - son also sick ( also negative for COVID) Hx of  migraine - took Nurtec for HA - no relief  Drank a powerade - decreased appetite  due to wegovy  Chills yesterday  Unable to sleep due to HA

## 2022-05-31 NOTE — Discharge Instructions (Signed)
Continue to drink lots of fluids Consider taking Mucinex (or generic) Take prednisone once a day for 5 days Restart your Flonase Call your doctor if not improving in a couple days

## 2022-06-03 ENCOUNTER — Other Ambulatory Visit (HOSPITAL_BASED_OUTPATIENT_CLINIC_OR_DEPARTMENT_OTHER): Payer: Self-pay

## 2022-06-04 ENCOUNTER — Other Ambulatory Visit (HOSPITAL_BASED_OUTPATIENT_CLINIC_OR_DEPARTMENT_OTHER): Payer: Self-pay

## 2022-06-04 ENCOUNTER — Ambulatory Visit (INDEPENDENT_AMBULATORY_CARE_PROVIDER_SITE_OTHER): Payer: No Typology Code available for payment source | Admitting: Family

## 2022-06-04 VITALS — BP 129/78 | HR 77 | Temp 98.0°F | Resp 16 | Wt 195.0 lb

## 2022-06-04 DIAGNOSIS — J0100 Acute maxillary sinusitis, unspecified: Secondary | ICD-10-CM

## 2022-06-04 MED ORDER — AMOXICILLIN-POT CLAVULANATE 875-125 MG PO TABS
1.0000 | ORAL_TABLET | Freq: Two times a day (BID) | ORAL | 0 refills | Status: DC
  Filled 2022-06-04: qty 20, 10d supply, fill #0

## 2022-06-04 MED ORDER — FLUCONAZOLE 150 MG PO TABS
150.0000 mg | ORAL_TABLET | Freq: Once | ORAL | 0 refills | Status: AC
  Filled 2022-06-04: qty 1, 1d supply, fill #0

## 2022-06-04 NOTE — Progress Notes (Signed)
Subjective:     Patient ID: Kaitlin Foster, female    DOB: Aug 13, 1981, 41 y.o.   MRN: [id number]  Chief Complaint  Patient presents with   Sinusitis    Seen at urgent care on 7/23, taking prednisone   Facial Pain    Complains of sinus pain and pressure.     Sinusitis    Pt reports sinus congestion began Thursday/Friday last week (was having really bad headaches).  Went to urgent care. Was treated with toradol for headache and prednisone for pressure. Felt better but it was keeping her up. Drainage began to turn yellow yesterday with pain in her upper teeth.   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;   2 stage embolization ACA supply to AVM @ [facility]  Sept 11th and 18th 2008 then craniotomy resection AVM 07-28-2007   Hypertension    Left ureteral stone    Migraines    Nephrolithiasis    left renal nonobstructive stone per CT 06-14-2019   Uterine fibroid     Past Surgical History:  Procedure Laterality Date   CEREBRAL EMBOLIZATION  Sept 11th and 18th, 2008   '@[facility]'$    2 staged embolization left ACA supply to AVM   CESAREAN SECTION  07/06/2009   CRANIOTOMY  07-29-2007   @ [facility]   resection left frontal AVM    Family History  Problem Relation Age of Onset   Hypertension Father    Alcohol abuse Father    Arthritis Father    Obesity Father    Hypertension Mother    Arthritis Mother    Migraines Mother    Obesity Mother    Cancer  Mother        sinus tumor   Migraines Sister    Ulcerative colitis Sister    Migraines Maternal Aunt    ADD / ADHD Son    Cancer Maternal Grandmother        colon diagnosed at age 33   Dementia Maternal Grandmother    Heart disease Neg Hx     Social History   Socioeconomic History   Marital status: Married    Spouse name: Thad   Number of children: 1   Years of education: Not on file   Highest education level: Associate degree: academic program  Occupational History   Occupation: Therapist, sports  Tobacco Use   Smoking status: Never   Smokeless tobacco: Never  Vaping Use   Vaping Use: Never used  Substance and Sexual Activity   Alcohol use: No    Alcohol/week: 0.0 standard drinks of alcohol   Drug use: No   Sexual activity: Yes    Birth control/protection: Condom  Other Topics Concern   Not on file  Social History Narrative   Regular exercise: no regular exercise   Caffeine use:  1-2 coffee daily   Son born 2010   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 30 tablet    docusate sodium (COLACE) 50 MG capsule Take 50 mg by mouth 2 (two) times daily.     famotidine (PEPCID) 20 MG tablet Take 20 mg by mouth daily as needed for heartburn or indigestion.      Ferrous Sulfate (IRON) 325 (65 Fe) MG TABS Take 1 tablet (325 mg total) by mouth daily. 30 tablet 0   fluticasone (FLONASE) 50 MCG/ACT nasal spray Place 2 sprays into  both nostrils daily. 16 g 0   levocetirizine (XYZAL) 5 MG tablet TAKE 1 TABLET (5 MG TOTAL) BY MOUTH EVERY EVENING. 90 tablet 3   lisdexamfetamine (VYVANSE) 50 MG capsule Take 1 capsule (50 mg total) by mouth daily. 30 capsule 0   metoprolol succinate (TOPROL-XL) 50 MG 24 hr tablet Take 1 tablet (50 mg total) by mouth daily. Take with or immediately following a meal. 90 tablet 1   predniSONE (DELTASONE) 20 MG tablet Take 2 tablets (40 mg total) by mouth daily with breakfast. 10 tablet 0   Rimegepant Sulfate (NURTEC) 75 MG TBDP TAKE 1 TABLET (75 MG) BY MOUTH DAILY AS NEEDED FOR MIGRAINES. TAKE AS CLOSE TO ONSET OF MIGRAINE AS POSSIBLE. ONE DAILY MAXIMUM. 16 tablet 11   rizatriptan (MAXALT-MLT) 10 MG disintegrating tablet Take 1 tablet (10 mg total) by mouth as needed for migraine. May repeat in 2 hours if needed 9 tablet 11   Semaglutide-Weight Management (WEGOVY) 0.5 MG/0.5ML SOAJ Inject 0.5 mg into the skin once a week. 2 mL 0   No facility-administered medications prior to visit.    Allergies  Allergen Reactions   Codeine Other (See Comments)    REACTION: upset stomach once, but has taken it other times and been ok   Lisinopril Other (See Comments)    cough   Sulfa Antibiotics Hives    ROS     Objective:    Physical Exam Constitutional:      General: She is not in acute distress.    Appearance: Normal appearance. She is well-developed.  HENT:     Head: Normocephalic and atraumatic.     Right Ear: External ear normal.     Left Ear: External ear normal.     Ears:     Comments: Bilateral serous effusion without erythema or bulging.     Nose:     Right Sinus: Maxillary sinus tenderness present. No frontal sinus tenderness.     Left Sinus: Maxillary sinus tenderness present. No frontal sinus tenderness.  Eyes:     General: No scleral icterus. Neck:     Thyroid: No thyromegaly.  Cardiovascular:     Rate and Rhythm: Normal rate and regular rhythm.     Heart sounds: Normal  heart sounds. No murmur heard. Pulmonary:     Effort: Pulmonary effort is normal. No respiratory distress.  Breath sounds: Normal breath sounds. No wheezing.  Musculoskeletal:     Cervical back: Neck supple.  Skin:    General: Skin is warm and dry.  Neurological:     Mental Status: She is alert and oriented to person, place, and time.  Psychiatric:        Mood and Affect: Mood normal.        Behavior: Behavior normal.        Thought Content: Thought content normal.        Judgment: Judgment normal.     BP 129/78 (BP Location: Right Arm, Patient Position: Sitting, Cuff Size: Small)   Pulse 77   Temp 98 F (36.7 C) (Oral)   Resp 16   Wt 195 lb (88.5 kg)   LMP 05/17/2022 (Approximate)   SpO2 100%   BMI 33.47 kg/m  Wt Readings from Last 3 Encounters:  06/04/22 195 lb (88.5 kg)  05/31/22 194 lb (88 kg)  05/29/22 194 lb (88 kg)       Assessment & Plan:   Problem List Items Addressed This Visit       Unprioritized   Acute maxillary sinusitis - Primary    She is currently on prednisone '40mg'$  once daily for 5 days. Took 2 days so far. Having insomnia.  Recommended that she cut down to '20mg'$  once daily.  Rx with augmentin. Provided diflucan in case of yeast infection.       Relevant Medications   amoxicillin-clavulanate (AUGMENTIN) 875-125 MG tablet   fluconazole (DIFLUCAN) 150 MG tablet    I am having Kern start on amoxicillin-clavulanate and fluconazole. I am also having her maintain her rizatriptan, famotidine, Iron, levocetirizine, Nurtec, Vitamin D3, amLODipine, metoprolol succinate, docusate sodium, Wegovy, lisdexamfetamine, fluticasone, and predniSONE.  Meds ordered this encounter  Medications   amoxicillin-clavulanate (AUGMENTIN) 875-125 MG tablet    Sig: Take 1 tablet by mouth 2 (two) times daily.    Dispense:  20 tablet    Refill:  0    Order Specific Question:   Supervising Provider    Answer:   Penni Homans A [[id number]]   fluconazole (DIFLUCAN)  150 MG tablet    Sig: Take 1 tablet (150 mg total) by mouth once for 1 dose. As needed for yeast infection    Dispense:  1 tablet    Refill:  0    Order Specific Question:   Supervising Provider    Answer:   Penni Homans A [[id number]]

## 2022-06-04 NOTE — Assessment & Plan Note (Signed)
She is currently on prednisone '40mg'$  once daily for 5 days. Took 2 days so far. Having insomnia.  Recommended that she cut down to '20mg'$  once daily.  Rx with augmentin. Provided diflucan in case of yeast infection.

## 2022-06-09 ENCOUNTER — Other Ambulatory Visit (HOSPITAL_BASED_OUTPATIENT_CLINIC_OR_DEPARTMENT_OTHER): Payer: Self-pay

## 2022-06-15 ENCOUNTER — Other Ambulatory Visit (HOSPITAL_COMMUNITY): Payer: Self-pay

## 2022-06-22 ENCOUNTER — Ambulatory Visit (INDEPENDENT_AMBULATORY_CARE_PROVIDER_SITE_OTHER): Payer: No Typology Code available for payment source | Admitting: Neurology

## 2022-06-22 DIAGNOSIS — G43711 Chronic migraine without aura, intractable, with status migrainosus: Secondary | ICD-10-CM | POA: Diagnosis not present

## 2022-06-22 MED ORDER — ONABOTULINUMTOXINA 200 UNITS IJ SOLR
155.0000 [IU] | Freq: Once | INTRAMUSCULAR | Status: AC
  Administered 2022-06-22: 155 [IU] via INTRAMUSCULAR

## 2022-06-22 NOTE — Progress Notes (Signed)
Botox- 200 units x 1 vial Lot: [id number] Expiration: 12/2024  NDC: 9558-3167-42  Bacteriostatic 0.9% Sodium Chloride- 45m total Lot: [id number] Expiration: 06/09/2024  NDC: [address]-34 Dx: GF58.307B/B

## 2022-06-22 NOTE — Progress Notes (Signed)
Consent Form Botulism Toxin Injection For Chronic Migraine 06/22/2022: stable 03/19/2022: stable, doing great 12/25/2021: stable  05/27/2021: Stable  Interval history 09/11/2021: Baseline daily headaches and16 migraine days. Since last botox she has had only 2 total very mild migraines a month and acute management works, nurtec works with zofran, she had side effects to zembrace, also [place] works, takes with zofran. More than 25 free headache days a month, exceptional improvement!.  She is very happy. She is on adderall now but not causing headache.   Orders: [facility] or [place] for dry needling for cervical and lumbar/lumbosacral dry needling for myofascial disease in the past       Reviewed orally with patient, additionally signature is on file:  Botulism toxin has been approved by the Federal drug administration for treatment of chronic migraine. Botulism toxin does not cure chronic migraine and it may not be effective in some patients.  The administration of botulism toxin is accomplished by injecting a small amount of toxin into the muscles of the neck and head. Dosage must be titrated for each individual. Any benefits resulting from botulism toxin tend to wear off after 3 months with a repeat injection required if benefit is to be maintained. Injections are usually done every 3-4 months with maximum effect peak achieved by about 2 or 3 weeks. Botulism toxin is expensive and you should be sure of what costs you will incur resulting from the injection.  The side effects of botulism toxin use for chronic migraine may include:   -Transient, and usually mild, facial weakness with facial injections  -Transient, and usually mild, head or neck weakness with head/neck injections  -Reduction or loss of forehead facial animation due to forehead muscle weakness  -Eyelid drooping  -Dry eye  -Pain at the site of injection or bruising at the site of injection  -Double  vision  -Potential unknown long term risks  Contraindications: You should not have Botox if you are pregnant, nursing, allergic to albumin, have an infection, skin condition, or muscle weakness at the site of the injection, or have myasthenia gravis, Lambert-Eaton syndrome, or ALS.  It is also possible that as with any injection, there may be an allergic reaction or no effect from the medication. Reduced effectiveness after repeated injections is sometimes seen and rarely infection at the injection site may occur. All care will be taken to prevent these side effects. If therapy is given over a long time, atrophy and wasting in the muscle injected may occur. Occasionally the patient's become refractory to treatment because they develop antibodies to the toxin. In this event, therapy needs to be modified.  I have read the above information and consent to the administration of botulism toxin.    BOTOX PROCEDURE NOTE FOR MIGRAINE HEADACHE    Contraindications and precautions discussed with patient(above). Aseptic procedure was observed and patient tolerated procedure. Procedure performed by Dr. Georgia Dom  The condition has existed for more than 6 months, and pt does not have a diagnosis of ALS, Myasthenia Gravis or Lambert-Eaton Syndrome.  Risks and benefits of injections discussed and pt agrees to proceed with the procedure.  Written consent obtained  These injections are medically necessary. Pt  receives good benefits from these injections. These injections do not cause sedations or hallucinations which the oral therapies may cause.  Description of procedure:  The patient was placed in a sitting position. The standard protocol was used for Botox as follows, with 5 units of Botox injected at  each site:   -Procerus muscle, midline injection  -Corrugator muscle, bilateral injection  -Frontalis muscle, bilateral injection, with 2 sites each side, medial injection was performed in the upper  one third of the frontalis muscle, in the region vertical from the medial inferior edge of the superior orbital rim. The lateral injection was again in the upper one third of the forehead vertically above the lateral limbus of the cornea, 1.5 cm lateral to the medial injection site.  -Temporalis muscle injection, 5 sites, bilaterally. The first injection was 3 cm above the tragus of the ear, second injection site was 1.5 cm to 3 cm up from the first injection site in line with the tragus of the ear. The third injection site was 1.5-3 cm forward between the first 2 injection sites. The fourth injection site was 1.5 cm posterior to the second injection site. 5th site laterally in the temporalis  muscleat the level of the outer canthus.  - Patient feels her clenching is a trigger for headaches. +5 units masseter bilaterally   - Patient feels the migraines are centered around the eyes +5 units bilaterally at the outer canthus in the orbicularis occuli  -Occipitalis muscle injection, 3 sites, bilaterally. The first injection was done one half way between the occipital protuberance and the tip of the mastoid process behind the ear. The second injection site was done lateral and superior to the first, 1 fingerbreadth from the first injection. The third injection site was 1 fingerbreadth superiorly and medially from the first injection site.  -Cervical paraspinal muscle injection, 2 sites, bilateral knee first injection site was 1 cm from the midline of the cervical spine, 3 cm inferior to the lower border of the occipital protuberance. The second injection site was 1.5 cm superiorly and laterally to the first injection site.  -Trapezius muscle injection was performed at 3 sites, bilaterally. The first injection site was in the upper trapezius muscle halfway between the inflection point of the neck, and the acromion. The second injection site was one half way between the acromion and the first injection site.  The third injection was done between the first injection site and the inflection point of the neck.   Will return for repeat injection in 3 months.   A 200 unit sof Botox was used, 45 U Botox not injected was wasted. The patient tolerated the procedure well, there were no complications of the above procedure.

## 2022-07-03 ENCOUNTER — Other Ambulatory Visit: Payer: Self-pay | Admitting: Family

## 2022-07-03 ENCOUNTER — Other Ambulatory Visit (HOSPITAL_COMMUNITY): Payer: Self-pay

## 2022-07-03 ENCOUNTER — Encounter: Payer: Self-pay | Admitting: Family

## 2022-07-03 MED ORDER — METOPROLOL SUCCINATE ER 50 MG PO TB24
50.0000 mg | ORAL_TABLET | Freq: Every day | ORAL | 1 refills | Status: DC
  Filled 2022-07-03: qty 90, 90d supply, fill #0

## 2022-07-03 MED ORDER — AMLODIPINE BESYLATE 5 MG PO TABS
ORAL_TABLET | Freq: Every day | ORAL | 1 refills | Status: DC
Start: 2022-07-03 — End: 2022-08-26
  Filled 2022-07-03: qty 90, 90d supply, fill #0

## 2022-07-03 MED ORDER — LISDEXAMFETAMINE DIMESYLATE 50 MG PO CAPS
50.0000 mg | ORAL_CAPSULE | Freq: Every day | ORAL | 0 refills | Status: DC
  Filled 2022-07-03: qty 30, 30d supply, fill #0

## 2022-07-03 MED ORDER — LEVOCETIRIZINE DIHYDROCHLORIDE 5 MG PO TABS
ORAL_TABLET | Freq: Every evening | ORAL | 1 refills | Status: DC
  Filled 2022-07-03: qty 90, 90d supply, fill #0
  Filled 2022-10-23: qty 90, 90d supply, fill #1

## 2022-07-03 NOTE — Telephone Encounter (Signed)
Requesting: Vyvanse '50mg'$   Contract: 09/17/20 UDS: 04/25/21 Last Visit: 06/04/22 Next Visit: 08/24/22 Last Refill: 05/29/22 #30 and 0RF  Please Advise

## 2022-07-05 MED ORDER — WEGOVY 1 MG/0.5ML ~~LOC~~ SOAJ
1.0000 mg | SUBCUTANEOUS | 0 refills | Status: DC
Start: 2022-07-05 — End: 2022-07-24
  Filled 2022-07-05: qty 2, 28d supply, fill #0

## 2022-07-06 ENCOUNTER — Other Ambulatory Visit (HOSPITAL_BASED_OUTPATIENT_CLINIC_OR_DEPARTMENT_OTHER): Payer: Self-pay

## 2022-07-24 ENCOUNTER — Other Ambulatory Visit (HOSPITAL_BASED_OUTPATIENT_CLINIC_OR_DEPARTMENT_OTHER): Payer: Self-pay

## 2022-07-24 MED ORDER — WEGOVY 1 MG/0.5ML ~~LOC~~ SOAJ
1.0000 mg | SUBCUTANEOUS | 0 refills | Status: DC
  Filled 2022-07-24: qty 2, 28d supply, fill #0

## 2022-07-24 NOTE — Addendum Note (Signed)
Addended by: [name] on: 07/24/2022 01:42 PM   Modules accepted: Orders

## 2022-08-05 ENCOUNTER — Other Ambulatory Visit (HOSPITAL_BASED_OUTPATIENT_CLINIC_OR_DEPARTMENT_OTHER): Payer: Self-pay

## 2022-08-05 ENCOUNTER — Other Ambulatory Visit: Payer: Self-pay | Admitting: Family Medicine

## 2022-08-05 ENCOUNTER — Other Ambulatory Visit (HOSPITAL_COMMUNITY): Payer: Self-pay

## 2022-08-05 MED ORDER — LISDEXAMFETAMINE DIMESYLATE 50 MG PO CAPS
50.0000 mg | ORAL_CAPSULE | Freq: Every day | ORAL | 0 refills | Status: DC
  Filled 2022-08-05: qty 30, 30d supply, fill #0

## 2022-08-05 MED ORDER — WEGOVY 0.5 MG/0.5ML ~~LOC~~ SOAJ
0.5000 mg | SUBCUTANEOUS | 3 refills | Status: DC
  Filled 2022-08-05: qty 2, 28d supply, fill #0

## 2022-08-05 NOTE — Addendum Note (Signed)
Addended by: [name] on: 08/05/2022 12:03 PM   Modules accepted: Orders

## 2022-08-05 NOTE — Telephone Encounter (Signed)
Last OV--06/04/2022 Last RF---07/03/2022--#30 no refills [facility] on 04/25/2021 Next scheduled OV---08/24/2022

## 2022-08-07 ENCOUNTER — Other Ambulatory Visit (HOSPITAL_BASED_OUTPATIENT_CLINIC_OR_DEPARTMENT_OTHER): Payer: Self-pay

## 2022-08-10 ENCOUNTER — Other Ambulatory Visit (HOSPITAL_BASED_OUTPATIENT_CLINIC_OR_DEPARTMENT_OTHER): Payer: Self-pay

## 2022-08-19 ENCOUNTER — Other Ambulatory Visit (HOSPITAL_BASED_OUTPATIENT_CLINIC_OR_DEPARTMENT_OTHER): Payer: Self-pay

## 2022-08-19 MED ORDER — WEGOVY 1 MG/0.5ML ~~LOC~~ SOAJ
1.0000 mg | SUBCUTANEOUS | 2 refills | Status: DC
  Filled 2022-08-19: qty 2, 28d supply, fill #0
  Filled 2022-09-18: qty 2, 28d supply, fill #1
  Filled 2022-10-23: qty 2, 28d supply, fill #2

## 2022-08-19 NOTE — Addendum Note (Signed)
Addended by: [name] on: 08/19/2022 08:57 AM   Modules accepted: Orders

## 2022-08-20 ENCOUNTER — Other Ambulatory Visit (HOSPITAL_BASED_OUTPATIENT_CLINIC_OR_DEPARTMENT_OTHER): Payer: Self-pay

## 2022-08-24 ENCOUNTER — Ambulatory Visit: Payer: No Typology Code available for payment source | Admitting: Family

## 2022-08-25 ENCOUNTER — Ambulatory Visit: Payer: No Typology Code available for payment source | Admitting: Family

## 2022-08-26 ENCOUNTER — Telehealth: Payer: Self-pay | Admitting: Family

## 2022-08-26 ENCOUNTER — Ambulatory Visit (INDEPENDENT_AMBULATORY_CARE_PROVIDER_SITE_OTHER): Payer: No Typology Code available for payment source | Admitting: Family

## 2022-08-26 ENCOUNTER — Other Ambulatory Visit (HOSPITAL_BASED_OUTPATIENT_CLINIC_OR_DEPARTMENT_OTHER): Payer: Self-pay

## 2022-08-26 VITALS — BP 118/88 | HR 89 | Temp 98.2°F | Resp 16 | Wt 180.0 lb

## 2022-08-26 DIAGNOSIS — G43109 Migraine with aura, not intractable, without status migrainosus: Secondary | ICD-10-CM

## 2022-08-26 DIAGNOSIS — E669 Obesity, unspecified: Secondary | ICD-10-CM | POA: Diagnosis not present

## 2022-08-26 DIAGNOSIS — I10 Essential (primary) hypertension: Secondary | ICD-10-CM | POA: Diagnosis not present

## 2022-08-26 DIAGNOSIS — E611 Iron deficiency: Secondary | ICD-10-CM | POA: Diagnosis not present

## 2022-08-26 DIAGNOSIS — E559 Vitamin D deficiency, unspecified: Secondary | ICD-10-CM

## 2022-08-26 DIAGNOSIS — E66811 Obesity, class 1: Secondary | ICD-10-CM

## 2022-08-26 DIAGNOSIS — Z1231 Encounter for screening mammogram for malignant neoplasm of breast: Secondary | ICD-10-CM

## 2022-08-26 DIAGNOSIS — F900 Attention-deficit hyperactivity disorder, predominantly inattentive type: Secondary | ICD-10-CM

## 2022-08-26 DIAGNOSIS — J301 Allergic rhinitis due to pollen: Secondary | ICD-10-CM

## 2022-08-26 LAB — CBC WITH DIFFERENTIAL/PLATELET
Basophils Absolute: 0 10*3/uL (ref 0.0–0.1)
Basophils Relative: 0.8 % (ref 0.0–3.0)
Eosinophils Absolute: 0.2 10*3/uL (ref 0.0–0.7)
Eosinophils Relative: 4.2 % (ref 0.0–5.0)
HCT: 41.2 % (ref 36.0–46.0)
Hemoglobin: 13.4 g/dL (ref 12.0–15.0)
Lymphocytes Relative: 24.7 % (ref 12.0–46.0)
Lymphs Abs: 1.4 10*3/uL (ref 0.7–4.0)
MCHC: 32.5 g/dL (ref 30.0–36.0)
MCV: 88.4 fl (ref 78.0–100.0)
Monocytes Absolute: 0.5 10*3/uL (ref 0.1–1.0)
Monocytes Relative: 8.2 % (ref 3.0–12.0)
Neutro Abs: 3.4 10*3/uL (ref 1.4–7.7)
Neutrophils Relative %: 62.1 % (ref 43.0–77.0)
Platelets: 287 10*3/uL (ref 150.0–400.0)
RBC: 4.66 Mil/uL (ref 3.87–5.11)
RDW: 13.9 % (ref 11.5–15.5)
WBC: 5.5 10*3/uL (ref 4.0–10.5)

## 2022-08-26 LAB — VITAMIN D 25 HYDROXY (VIT D DEFICIENCY, FRACTURES): VITD: 35.17 ng/mL (ref 30.00–100.00)

## 2022-08-26 LAB — BASIC METABOLIC PANEL
BUN: 15 mg/dL (ref 6–23)
CO2: 28 mEq/L (ref 19–32)
Calcium: 9.2 mg/dL (ref 8.4–10.5)
Chloride: 105 mEq/L (ref 96–112)
Creatinine, Ser: 0.86 mg/dL (ref 0.40–1.20)
GFR: 84.84 mL/min (ref >60.00)
Glucose, Bld: 96 mg/dL (ref 70–99)
Potassium: 4 mEq/L (ref 3.5–5.1)
Sodium: 139 mEq/L (ref 135–145)

## 2022-08-26 LAB — IRON: Iron: 135 ug/dL (ref 42–145)

## 2022-08-26 MED ORDER — AMLODIPINE BESYLATE 5 MG PO TABS
5.0000 mg | ORAL_TABLET | Freq: Every day | ORAL | 1 refills | Status: DC
  Filled 2022-08-26: qty 90, fill #0
  Filled 2022-09-18: qty 90, 90d supply, fill #0
  Filled 2022-12-15 – 2022-12-22 (×3): qty 90, 90d supply, fill #1

## 2022-08-26 MED ORDER — METOPROLOL SUCCINATE ER 50 MG PO TB24
50.0000 mg | ORAL_TABLET | Freq: Every day | ORAL | 1 refills | Status: DC
  Filled 2022-08-26 – 2022-12-09 (×2): qty 90, 90d supply, fill #0
  Filled 2023-05-21: qty 90, 90d supply, fill #1

## 2022-08-26 NOTE — Assessment & Plan Note (Addendum)
Wt Readings from Last 3 Encounters:  08/26/22 180 lb (81.6 kg)  06/04/22 195 lb (88.5 kg)  05/31/22 194 lb (88 kg)   Tolerating wegovy '1mg'$ .  Weight is coming down nicely. Continue same. Using zofran prn.

## 2022-08-26 NOTE — Telephone Encounter (Signed)
Records release faxed 

## 2022-08-26 NOTE — Patient Instructions (Signed)
Please complete lab work prior to leaving.   

## 2022-08-26 NOTE — Telephone Encounter (Signed)
Please call [facility], for pap result.

## 2022-08-26 NOTE — Assessment & Plan Note (Signed)
Stable on xyzal with rare flonase.

## 2022-08-26 NOTE — Assessment & Plan Note (Signed)
Stable on vyvanse '50mg'$ . Will continue same.

## 2022-08-26 NOTE — Progress Notes (Signed)
Subjective:   By signing my name below, I, Kaitlin Foster , attest that this documentation has been prepared under the direction and in the presence of Kaitlin Foster, 08/26/2022.   Patient ID: Kaitlin Foster, female    DOB: 04/17/1981, 41 y.o.   MRN: [id number]  Chief Complaint  Patient presents with   ADHD    Here for follow up   Obesity    Here for follow up    HPI Patient is in today for an office visit.  Weight- It is reported that patient  lost weight since last visit. She is currently taking 1 mg Wegovy. She states that she is tolerating medication well. Patient reports she experiences occasional diarrhea which is resolved in a couple of days, reports it may be due to food choices. States she also may experience nausea which she takes her Zofran medication for. Wt Readings from Last 3 Encounters:  08/26/22 180 lb (81.6 kg)  06/04/22 195 lb (88.5 kg)  05/31/22 194 lb (88 kg)   Migraine- She reports that she is tolerating 10 mg Rizatriptan well for migraines which she usually gets one a month when she starts her menstrual cycle. She sometimes has migraines with visual auras.  Blood pressure- Blood pressure is normal this visit. She currently takes 50 mg Metoprolol Succinate for hypertension. BP Readings from Last 3 Encounters:  08/26/22 118/88  06/04/22 129/78  05/31/22 123/85   Pulse Readings from Last 3 Encounters:  08/26/22 89  06/04/22 77  05/31/22 85   ADHD-She is tolerating 50 mg Vyvanse well for her adhd.  Allergies- She takes 5 mg Xyzal every day for allergies and occasionally uses 50 mcg/act Flonase. Patient currently takes 325 mg iron supplements.   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;   2 stage embolization ACA supply to AVM @ [facility]  Sept 11th and 18th 2008 then craniotomy resection AVM 07-28-2007   Hypertension    Left ureteral stone    Migraines    Nephrolithiasis    left renal nonobstructive stone per CT 06-14-2019   Uterine fibroid     Past Surgical History:  Procedure Laterality Date   CEREBRAL EMBOLIZATION  Sept 11th and 18th, 2008   '@[facility]'$    2 staged embolization left ACA supply to AVM   CESAREAN SECTION  07/06/2009   CRANIOTOMY  07-29-2007   @ [facility]   resection left frontal AVM    Family History  Problem Relation Age of Onset   Hypertension Father    Alcohol abuse Father    Arthritis Father    Obesity Father    Hypertension Mother    Arthritis Mother    Migraines Mother    Obesity Mother    Cancer Mother        sinus tumor   Migraines Sister    Ulcerative colitis Sister    Migraines Maternal Aunt  ADD / ADHD Son    Cancer Maternal Grandmother        colon diagnosed at age 77   Dementia Maternal Grandmother    Heart disease Neg Hx     Social History   Socioeconomic History   Marital status: Married    Spouse name: Kaitlin Foster   Number of children: 1   Years of education: Not on file   Highest education level: Associate degree: academic program  Occupational History   Occupation: Therapist, sports  Tobacco Use   Smoking status: Never   Smokeless tobacco: Never  Vaping Use   Vaping Use: Never used  Substance and Sexual Activity   Alcohol use: No    Alcohol/week: 0.0 standard drinks of alcohol   Drug use: No   Sexual activity: Yes    Birth control/protection: Condom  Other Topics Concern   Not on file  Social History Narrative   Regular  exercise: no regular exercise   Caffeine use:  1-2 coffee daily   Son born 2010   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 30 tablet    docusate sodium (COLACE) 50 MG capsule Take 50 mg by mouth 2 (two) times daily.     famotidine (PEPCID) 20 MG tablet Take 20 mg by mouth daily as needed for heartburn or indigestion.      Ferrous Sulfate (IRON) 325 (65 Fe) MG TABS Take 1 tablet (325 mg total) by mouth daily. 30 tablet 0   fluticasone (FLONASE) 50 MCG/ACT nasal spray Place 2 sprays into both nostrils daily. 16 g 0   levocetirizine (XYZAL) 5 MG tablet TAKE 1 TABLET (5 MG TOTAL) BY MOUTH EVERY EVENING. 90 tablet 1   lisdexamfetamine (VYVANSE) 50 MG capsule Take 1 capsule by mouth daily. 30 capsule 0   Rimegepant Sulfate (NURTEC) 75 MG TBDP DISSOLVE 1 TABLET BY MOUTH DAILY AS NEEDED FOR MIGRAINES. TAKE AS CLOSE TO ONSET OF MIGRAINE AS POSSIBLE. ONE DAILY MAXIMUM. 16 tablet 11   rizatriptan (MAXALT-MLT) 10 MG disintegrating tablet Take 1 tablet (10 mg total) by mouth as needed for migraine. May repeat in 2 hours if needed 9 tablet 11   Semaglutide-Weight Management (WEGOVY) 1 MG/0.5ML SOAJ Inject 1 mg into the skin once a week. 2 mL 2   amLODipine (NORVASC) 5 MG tablet TAKE 1 TABLET BY MOUTH ONCE DAILY 90 tablet 1   metoprolol succinate (TOPROL-XL) 50 MG 24 hr tablet Take 1 tablet (50 mg total) by mouth daily. Take with or immediately following a meal. 90 tablet 1   amoxicillin-clavulanate (AUGMENTIN) 875-125 MG  tablet Take 1 tablet by mouth 2 (two) times daily. 20 tablet 0   predniSONE (DELTASONE) 20 MG tablet Take 2 tablets (40 mg total) by mouth daily with breakfast. 10 tablet 0   No facility-administered medications prior to visit.    Allergies  Allergen Reactions   Codeine Other (See Comments)    REACTION: upset stomach once,  but has taken it other times and been ok   Lisinopril Other (See Comments)    cough   Sulfa Antibiotics Hives    ROS See HPI    Objective:    Physical Exam Constitutional:      General: She is not in acute distress.    Appearance: Normal appearance. She is not ill-appearing.  HENT:     Head: Normocephalic and atraumatic.     Right Ear: External ear normal.     Left Ear: External ear normal.  Eyes:     Extraocular Movements: Extraocular movements intact.     Pupils: Pupils are equal, round, and reactive to light.  Cardiovascular:     Rate and Rhythm: Normal rate and regular rhythm.     Heart sounds: Normal heart sounds. No murmur heard.    No gallop.  Pulmonary:     Effort: Pulmonary effort is normal. No respiratory distress.     Breath sounds: Normal breath sounds. No wheezing or rales.  Skin:    General: Skin is warm and dry.  Neurological:     Mental Status: She is alert and oriented to person, place, and time.  Psychiatric:        Mood and Affect: Mood normal.        Behavior: Behavior normal.        Judgment: Judgment normal.     BP 118/88 (BP Location: Right Arm, Patient Position: Sitting, Cuff Size: Small)   Pulse 89   Temp 98.2 F (36.8 C) (Oral)   Resp 16   Wt 180 lb (81.6 kg)   SpO2 100%   BMI 30.90 kg/m  Wt Readings from Last 3 Encounters:  08/26/22 180 lb (81.6 kg)  06/04/22 195 lb (88.5 kg)  05/31/22 194 lb (88 kg)       Assessment & Plan:   Problem List Items Addressed This Visit       Unprioritized   Vitamin D deficiency   Relevant Orders   Vitamin D (25 hydroxy)   Obesity (BMI 30.0-34.9)    Wt Readings from  Last 3 Encounters:  08/26/22 180 lb (81.6 kg)  06/04/22 195 lb (88.5 kg)  05/31/22 194 lb (88 kg)  Tolerating wegovy '1mg'$ .  Weight is coming down nicely. Continue same. Using zofran prn.       Migraines    Stable overall, using nurtec an zofran. Also gets botox from neuro.      Relevant Medications   metoprolol succinate (TOPROL-XL) 50 MG 24 hr tablet   amLODipine (NORVASC) 5 MG tablet   Hypertension    BP Readings from Last 3 Encounters:  08/26/22 118/88  06/04/22 129/78  05/31/22 123/85  BP stable on amlodipine '5mg'$  and toprol xl.        Relevant Medications   metoprolol succinate (TOPROL-XL) 50 MG 24 hr tablet   amLODipine (NORVASC) 5 MG tablet   Other Relevant Orders   Basic metabolic panel   Allergic rhinitis    Stable on xyzal with rare flonase.       ADHD, predominantly inattentive type    Stable on vyvanse '50mg'$ . Will continue same.      Other Visit Diagnoses     Iron deficiency    -  Primary   Relevant Orders   CBC with Differential/Platelet   Iron   Encounter for screening mammogram for malignant neoplasm of breast       Relevant Orders   MM 3D SCREEN BREAST BILATERAL  Meds ordered this encounter  Medications   metoprolol succinate (TOPROL-XL) 50 MG 24 hr tablet    Sig: Take 1 tablet (50 mg total) by mouth daily. Take with or immediately following a meal.    Dispense:  90 tablet    Refill:  1    Order Specific Question:   Supervising Provider    Answer:   Penni Homans A [[id number]]   amLODipine (NORVASC) 5 MG tablet    Sig: Take 1 tablet (5 mg total) by mouth daily.    Dispense:  90 tablet    Refill:  1    Order Specific Question:   Supervising Provider    Answer:   Pat Patrick, personally preformed the services described in this documentation.  All medical record entries made by the scribe were at my direction and in my presence.  I have reviewed the chart and discharge instructions (if applicable) and  agree that the record reflects my personal performance and is accurate and complete. 08/26/2022.  Jana Half O'Sullivan,acting as a Education administrator for Nance Pear, NP.,have documented all relevant documentation on the behalf of Nance Pear, NP,as directed by  Nance Pear, NP while in the presence of Nance Pear, NP.    Nance Pear, NP

## 2022-08-26 NOTE — Assessment & Plan Note (Signed)
Stable overall, using nurtec an zofran. Also gets botox from neuro.

## 2022-08-26 NOTE — Assessment & Plan Note (Signed)
BP Readings from Last 3 Encounters:  08/26/22 118/88  06/04/22 129/78  05/31/22 123/85   BP stable on amlodipine '5mg'$  and toprol xl.

## 2022-09-02 ENCOUNTER — Other Ambulatory Visit (HOSPITAL_COMMUNITY): Payer: Self-pay

## 2022-09-02 ENCOUNTER — Telehealth: Payer: No Typology Code available for payment source | Admitting: Nurse Practitioner

## 2022-09-02 DIAGNOSIS — R399 Unspecified symptoms and signs involving the genitourinary system: Secondary | ICD-10-CM

## 2022-09-02 MED ORDER — CEPHALEXIN 500 MG PO CAPS
500.0000 mg | ORAL_CAPSULE | Freq: Two times a day (BID) | ORAL | 0 refills | Status: DC
  Filled 2022-09-02: qty 14, 7d supply, fill #0

## 2022-09-02 NOTE — Progress Notes (Signed)

## 2022-09-18 ENCOUNTER — Other Ambulatory Visit: Payer: Self-pay | Admitting: Family

## 2022-09-18 ENCOUNTER — Other Ambulatory Visit (HOSPITAL_BASED_OUTPATIENT_CLINIC_OR_DEPARTMENT_OTHER): Payer: Self-pay

## 2022-09-18 NOTE — Telephone Encounter (Signed)
Requesting: Vyvanse '50mg'$   Contract: 08/26/22 UDS: 04/25/21 Last Visit: 08/26/22 Next Visit: 03/08/23 Last Refill: 08/05/22 #30 and 0RF   Please Advise

## 2022-09-20 ENCOUNTER — Other Ambulatory Visit (HOSPITAL_BASED_OUTPATIENT_CLINIC_OR_DEPARTMENT_OTHER): Payer: Self-pay

## 2022-09-20 MED ORDER — LISDEXAMFETAMINE DIMESYLATE 50 MG PO CAPS
50.0000 mg | ORAL_CAPSULE | Freq: Every day | ORAL | 0 refills | Status: DC
  Filled 2022-09-20: qty 30, 30d supply, fill #0

## 2022-09-21 ENCOUNTER — Encounter: Payer: Self-pay | Admitting: Neurology

## 2022-09-21 ENCOUNTER — Other Ambulatory Visit (HOSPITAL_BASED_OUTPATIENT_CLINIC_OR_DEPARTMENT_OTHER): Payer: Self-pay

## 2022-09-21 ENCOUNTER — Other Ambulatory Visit: Payer: Self-pay | Admitting: Neurology

## 2022-09-21 ENCOUNTER — Ambulatory Visit (INDEPENDENT_AMBULATORY_CARE_PROVIDER_SITE_OTHER): Payer: No Typology Code available for payment source | Admitting: Neurology

## 2022-09-21 DIAGNOSIS — G43711 Chronic migraine without aura, intractable, with status migrainosus: Secondary | ICD-10-CM | POA: Diagnosis not present

## 2022-09-21 MED ORDER — ONDANSETRON 4 MG PO TBDP
4.0000 mg | ORAL_TABLET | Freq: Three times a day (TID) | ORAL | 3 refills | Status: DC | PRN
  Filled 2022-09-21 – 2022-10-23 (×2): qty 30, 5d supply, fill #0
  Filled 2023-01-20 – 2023-01-29 (×2): qty 30, 5d supply, fill #1
  Filled 2023-04-09: qty 30, 5d supply, fill #2
  Filled 2023-08-31: qty 30, 5d supply, fill #3

## 2022-09-21 MED ORDER — ONABOTULINUMTOXINA 200 UNITS IJ SOLR
155.0000 [IU] | Freq: Once | INTRAMUSCULAR | Status: AC
  Administered 2022-09-21: 155 [IU] via INTRAMUSCULAR

## 2022-09-21 MED ORDER — ZAVZPRET 10 MG/ACT NA SOLN: 1.0000 | Freq: Once | NASAL | 0 refills | Status: AC

## 2022-09-21 NOTE — Progress Notes (Signed)
Botox- 200 units x 1 vial Lot: [id number] Expiration: 02/2025 NDC: 9622-2979-89  Bacteriostatic 0.9% Sodium Chloride- 59m total Lot: [id number]: 07/11/2023 NDC: [address]-18 Dx: GH63.149B/B

## 2022-09-21 NOTE — Progress Notes (Signed)
Consent Form Botulism Toxin Injection For Chronic Migraine   09/21/2022: 2 migraines a month, takes nurtec and ondansetron, doing great, gave a zavzpret sample   06/22/2022: stable 03/19/2022: stable, doing great 12/25/2021: stable  05/27/2021: Stable  Interval history 09/11/2021: Baseline daily headaches and16 migraine days. Since last botox she has had only 2 total very mild migraines a month and acute management works, nurtec works with zofran, she had side effects to zembrace, also [place] works, takes with zofran. More than 25 free headache days a month, exceptional improvement!.  She is very happy. She is on adderall now but not causing headache.   Orders: [facility] or [place] for dry needling for cervical and lumbar/lumbosacral dry needling for myofascial disease in the past       Reviewed orally with patient, additionally signature is on file:  Botulism toxin has been approved by the Federal drug administration for treatment of chronic migraine. Botulism toxin does not cure chronic migraine and it may not be effective in some patients.  The administration of botulism toxin is accomplished by injecting a small amount of toxin into the muscles of the neck and head. Dosage must be titrated for each individual. Any benefits resulting from botulism toxin tend to wear off after 3 months with a repeat injection required if benefit is to be maintained. Injections are usually done every 3-4 months with maximum effect peak achieved by about 2 or 3 weeks. Botulism toxin is expensive and you should be sure of what costs you will incur resulting from the injection.  The side effects of botulism toxin use for chronic migraine may include:   -Transient, and usually mild, facial weakness with facial injections  -Transient, and usually mild, head or neck weakness with head/neck injections  -Reduction or loss of forehead facial animation due to forehead muscle weakness  -Eyelid  drooping  -Dry eye  -Pain at the site of injection or bruising at the site of injection  -Double vision  -Potential unknown long term risks  Contraindications: You should not have Botox if you are pregnant, nursing, allergic to albumin, have an infection, skin condition, or muscle weakness at the site of the injection, or have myasthenia gravis, Lambert-Eaton syndrome, or ALS.  It is also possible that as with any injection, there may be an allergic reaction or no effect from the medication. Reduced effectiveness after repeated injections is sometimes seen and rarely infection at the injection site may occur. All care will be taken to prevent these side effects. If therapy is given over a long time, atrophy and wasting in the muscle injected may occur. Occasionally the patient's become refractory to treatment because they develop antibodies to the toxin. In this event, therapy needs to be modified.  I have read the above information and consent to the administration of botulism toxin.    BOTOX PROCEDURE NOTE FOR MIGRAINE HEADACHE    Contraindications and precautions discussed with patient(above). Aseptic procedure was observed and patient tolerated procedure. Procedure performed by Dr. Georgia Dom  The condition has existed for more than 6 months, and pt does not have a diagnosis of ALS, Myasthenia Gravis or Lambert-Eaton Syndrome.  Risks and benefits of injections discussed and pt agrees to proceed with the procedure.  Written consent obtained  These injections are medically necessary. Pt  receives good benefits from these injections. These injections do not cause sedations or hallucinations which the oral therapies may cause.  Description of procedure:  The patient was placed in  a sitting position. The standard protocol was used for Botox as follows, with 5 units of Botox injected at each site:   -Procerus muscle, midline injection  -Corrugator muscle, bilateral  injection  -Frontalis muscle, bilateral injection, with 2 sites each side, medial injection was performed in the upper one third of the frontalis muscle, in the region vertical from the medial inferior edge of the superior orbital rim. The lateral injection was again in the upper one third of the forehead vertically above the lateral limbus of the cornea, 1.5 cm lateral to the medial injection site.  -Temporalis muscle injection, 5 sites, bilaterally. The first injection was 3 cm above the tragus of the ear, second injection site was 1.5 cm to 3 cm up from the first injection site in line with the tragus of the ear. The third injection site was 1.5-3 cm forward between the first 2 injection sites. The fourth injection site was 1.5 cm posterior to the second injection site. 5th site laterally in the temporalis  muscleat the level of the outer canthus.  - Patient feels her clenching is a trigger for headaches. +5 units masseter bilaterally   - Patient feels the migraines are centered around the eyes +5 units bilaterally at the outer canthus in the orbicularis occuli  -Occipitalis muscle injection, 3 sites, bilaterally. The first injection was done one half way between the occipital protuberance and the tip of the mastoid process behind the ear. The second injection site was done lateral and superior to the first, 1 fingerbreadth from the first injection. The third injection site was 1 fingerbreadth superiorly and medially from the first injection site.  -Cervical paraspinal muscle injection, 2 sites, bilateral knee first injection site was 1 cm from the midline of the cervical spine, 3 cm inferior to the lower border of the occipital protuberance. The second injection site was 1.5 cm superiorly and laterally to the first injection site.  -Trapezius muscle injection was performed at 3 sites, bilaterally. The first injection site was in the upper trapezius muscle halfway between the inflection point of the  neck, and the acromion. The second injection site was one half way between the acromion and the first injection site. The third injection was done between the first injection site and the inflection point of the neck.   Will return for repeat injection in 3 months.   A 200 unit sof Botox was used, 45 U Botox not injected was wasted. The patient tolerated the procedure well, there were no complications of the above procedure.

## 2022-09-29 ENCOUNTER — Other Ambulatory Visit (HOSPITAL_BASED_OUTPATIENT_CLINIC_OR_DEPARTMENT_OTHER): Payer: Self-pay

## 2022-10-20 ENCOUNTER — Encounter: Payer: Self-pay | Admitting: Family

## 2022-10-23 ENCOUNTER — Other Ambulatory Visit (HOSPITAL_COMMUNITY): Payer: Self-pay

## 2022-10-23 ENCOUNTER — Other Ambulatory Visit: Payer: Self-pay | Admitting: Family

## 2022-10-23 ENCOUNTER — Other Ambulatory Visit: Payer: Self-pay

## 2022-10-23 ENCOUNTER — Encounter: Payer: Self-pay | Admitting: Neurology

## 2022-10-23 MED ORDER — LISDEXAMFETAMINE DIMESYLATE 50 MG PO CAPS
50.0000 mg | ORAL_CAPSULE | Freq: Every day | ORAL | 0 refills | Status: DC
  Filled 2022-10-23: qty 30, 30d supply, fill #0

## 2022-10-23 NOTE — Telephone Encounter (Signed)
Requesting: Vyvanse '50mg'$   Contract: 08/26/22 UDS: 04/25/21 Last Visit: 08/26/22 Next Visit: 03/08/23 Last Refill: 09/20/22 #30 and 0RF   Please Advise

## 2022-10-24 ENCOUNTER — Other Ambulatory Visit (HOSPITAL_COMMUNITY): Payer: Self-pay

## 2022-10-26 ENCOUNTER — Other Ambulatory Visit: Payer: Self-pay | Admitting: *Deleted

## 2022-10-26 ENCOUNTER — Other Ambulatory Visit (HOSPITAL_BASED_OUTPATIENT_CLINIC_OR_DEPARTMENT_OTHER): Payer: Self-pay

## 2022-10-26 MED ORDER — NURTEC 75 MG PO TBDP
ORAL_TABLET | ORAL | 11 refills | Status: DC
  Filled 2022-10-26: qty 16, 30d supply, fill #0
  Filled 2022-12-09: qty 16, 30d supply, fill #1
  Filled 2023-04-09: qty 16, 30d supply, fill #2
  Filled 2023-05-21: qty 16, 30d supply, fill #3
  Filled 2023-08-31: qty 16, 30d supply, fill #4
  Filled 2023-10-25: qty 16, 30d supply, fill #5

## 2022-10-26 NOTE — Telephone Encounter (Signed)
Received Nurtec PA. Attempted to complete on CMM.   Key: [id number] - Rx #: [id number]  Member has an active PA on file which is expiring on 03/25/2023 and has 8 no. of fills remaining.

## 2022-10-26 NOTE — Telephone Encounter (Signed)
Per last Botox visit, pt uses Nurtec prn. This has been refilled.

## 2022-11-16 ENCOUNTER — Encounter (HOSPITAL_BASED_OUTPATIENT_CLINIC_OR_DEPARTMENT_OTHER): Payer: Self-pay

## 2022-11-16 ENCOUNTER — Ambulatory Visit (HOSPITAL_BASED_OUTPATIENT_CLINIC_OR_DEPARTMENT_OTHER)
Admission: RE | Admit: 2022-11-16 | Discharge: 2022-11-16 | Disposition: A | Discharge status: Home / Self Care | Payer: 59 | Source: Ambulatory Visit | Attending: Family | Admitting: Family

## 2022-11-16 DIAGNOSIS — Z1231 Encounter for screening mammogram for malignant neoplasm of breast: Secondary | ICD-10-CM | POA: Diagnosis not present

## 2022-11-26 ENCOUNTER — Telehealth: Payer: Self-pay | Admitting: Neurology

## 2022-11-26 DIAGNOSIS — G43711 Chronic migraine without aura, intractable, with status migrainosus: Secondary | ICD-10-CM

## 2022-11-26 NOTE — Telephone Encounter (Signed)
Chronic Migraine CPT 64615  Botox J0585 Units:200  G43.711 Chronic Migraine without aura, intractable, with status migrainous  [employer] on file.

## 2022-11-26 NOTE — Telephone Encounter (Signed)
Pt is scheduled for botox injection for 12/14/22 and will need a new auth before appointment. Prior PA expired 11/25/22

## 2022-11-27 ENCOUNTER — Other Ambulatory Visit (HOSPITAL_COMMUNITY): Payer: Self-pay

## 2022-11-27 NOTE — Telephone Encounter (Signed)
Pharmacy Patient Advocate Encounter   Received notification from [facility] that prior authorization for Botox 200UNIT solution is required/requested.    PA submitted on 11/27/2022 to (ins) [employer] via CoverMyMeds Key  [id number] Status is pending

## 2022-12-02 ENCOUNTER — Other Ambulatory Visit (HOSPITAL_COMMUNITY): Payer: Self-pay

## 2022-12-02 NOTE — Telephone Encounter (Signed)
BOTOX ONE-Benefit Verification [id number] Submitted!

## 2022-12-02 NOTE — Telephone Encounter (Signed)
Pharmacy Patient Advocate Encounter  Prior Authorization for Botox 200UNIT solution has been approved.    PA# PA Case ID #: [id number] Effective dates: 12/01/2022 through 05/30/2023 Per test billing co-pay is $750.00 This can be filled at [facility]

## 2022-12-09 ENCOUNTER — Telehealth: Payer: Self-pay

## 2022-12-09 ENCOUNTER — Other Ambulatory Visit: Payer: Self-pay | Admitting: Family

## 2022-12-09 ENCOUNTER — Other Ambulatory Visit (HOSPITAL_BASED_OUTPATIENT_CLINIC_OR_DEPARTMENT_OTHER): Payer: Self-pay

## 2022-12-09 ENCOUNTER — Other Ambulatory Visit: Payer: Self-pay

## 2022-12-09 ENCOUNTER — Other Ambulatory Visit (HOSPITAL_COMMUNITY): Payer: Self-pay

## 2022-12-09 ENCOUNTER — Encounter: Payer: Self-pay | Admitting: Family

## 2022-12-09 MED ORDER — WEGOVY 1 MG/0.5ML ~~LOC~~ SOAJ
1.0000 mg | SUBCUTANEOUS | 2 refills | Status: DC
  Filled 2022-12-09: qty 2, 28d supply, fill #0

## 2022-12-09 MED ORDER — LISDEXAMFETAMINE DIMESYLATE 50 MG PO CAPS
50.0000 mg | ORAL_CAPSULE | Freq: Every day | ORAL | 0 refills | Status: DC
  Filled 2022-12-09 – 2023-01-29 (×3): qty 30, 30d supply, fill #0

## 2022-12-09 NOTE — Telephone Encounter (Signed)
PA initiated via Covermymeds; KEY: [id number]. Awaiting determination.

## 2022-12-09 NOTE — Telephone Encounter (Signed)
Requesting: vyvanse 50 mg Contract: Yes UDS: 04/25/21 Last Visit: 08/26/2022 Next Visit: 03/08/2023 Last Refill: 10/23/22  Please Advise

## 2022-12-09 NOTE — Telephone Encounter (Signed)
Can you please send her script to [place]

## 2022-12-10 ENCOUNTER — Other Ambulatory Visit (HOSPITAL_COMMUNITY): Payer: Self-pay

## 2022-12-10 ENCOUNTER — Other Ambulatory Visit (HOSPITAL_BASED_OUTPATIENT_CLINIC_OR_DEPARTMENT_OTHER): Payer: Self-pay

## 2022-12-10 ENCOUNTER — Other Ambulatory Visit: Payer: Self-pay

## 2022-12-10 MED ORDER — BOTOX 200 UNITS IJ SOLR
INTRAMUSCULAR | 3 refills | Status: DC
  Filled 2022-12-10: qty 1, 84d supply, fill #0
  Filled 2023-03-02: qty 1, 84d supply, fill #1
  Filled 2023-05-21: qty 1, 84d supply, fill #2
  Filled 2023-08-17: qty 1, 84d supply, fill #3

## 2022-12-10 NOTE — Telephone Encounter (Signed)
Prescription sent

## 2022-12-10 NOTE — Addendum Note (Signed)
Addended by: Gildardo Griffes on: 12/10/2022 07:55 AM   Modules accepted: Orders

## 2022-12-11 ENCOUNTER — Other Ambulatory Visit (HOSPITAL_BASED_OUTPATIENT_CLINIC_OR_DEPARTMENT_OTHER): Payer: Self-pay

## 2022-12-14 ENCOUNTER — Ambulatory Visit (INDEPENDENT_AMBULATORY_CARE_PROVIDER_SITE_OTHER): Payer: 59 | Admitting: Neurology

## 2022-12-14 ENCOUNTER — Other Ambulatory Visit (HOSPITAL_BASED_OUTPATIENT_CLINIC_OR_DEPARTMENT_OTHER): Payer: Self-pay

## 2022-12-14 ENCOUNTER — Encounter: Payer: Self-pay | Admitting: Neurology

## 2022-12-14 DIAGNOSIS — G43711 Chronic migraine without aura, intractable, with status migrainosus: Secondary | ICD-10-CM

## 2022-12-14 MED ORDER — ONABOTULINUMTOXINA 200 UNITS IJ SOLR
155.0000 [IU] | Freq: Once | INTRAMUSCULAR | Status: AC
  Administered 2022-12-14: 155 [IU] via INTRAMUSCULAR

## 2022-12-14 NOTE — Progress Notes (Signed)
Botox- 200 units x 1 vial Lot: [id number] Expiration: 04/2025 NDC: 8377-9396-88  Bacteriostatic 0.9% Sodium Chloride- 73m total Lot: [id number]: 11/25 NDC: 672182-883-37 Dx: GO45.146S/P

## 2022-12-14 NOTE — Progress Notes (Signed)
Consent Form Botulism Toxin Injection For Chronic Migraine  12/14/2022: stable, doin ggreat 09/21/2022: 2 migraines a month, takes nurtec and ondansetron, doing great, gave a zavzpret sample did not like   06/22/2022: stable 03/19/2022: stable, doing great 12/25/2021: stable  05/27/2021: Stable  Interval history 09/11/2021: Baseline daily headaches and16 migraine days. Since last botox she has had only 2 total very mild migraines a month and acute management works, nurtec works with zofran, she had side effects to zembrace, also [place] works, takes with zofran. More than 25 free headache days a month, exceptional improvement!.  She is very happy. She is on adderall now but not causing headache.   Orders: [facility] or [facility] for dry needling for cervical and lumbar/lumbosacral dry needling for myofascial disease in the past       Reviewed orally with patient, additionally signature is on file:  Botulism toxin has been approved by the Federal drug administration for treatment of chronic migraine. Botulism toxin does not cure chronic migraine and it may not be effective in some patients.  The administration of botulism toxin is accomplished by injecting a small amount of toxin into the muscles of the neck and head. Dosage must be titrated for each individual. Any benefits resulting from botulism toxin tend to wear off after 3 months with a repeat injection required if benefit is to be maintained. Injections are usually done every 3-4 months with maximum effect peak achieved by about 2 or 3 weeks. Botulism toxin is expensive and you should be sure of what costs you will incur resulting from the injection.  The side effects of botulism toxin use for chronic migraine may include:   -Transient, and usually mild, facial weakness with facial injections  -Transient, and usually mild, head or neck weakness with head/neck injections  -Reduction or loss of forehead facial animation  due to forehead muscle weakness  -Eyelid drooping  -Dry eye  -Pain at the site of injection or bruising at the site of injection  -Double vision  -Potential unknown long term risks  Contraindications: You should not have Botox if you are pregnant, nursing, allergic to albumin, have an infection, skin condition, or muscle weakness at the site of the injection, or have myasthenia gravis, Lambert-Eaton syndrome, or ALS.  It is also possible that as with any injection, there may be an allergic reaction or no effect from the medication. Reduced effectiveness after repeated injections is sometimes seen and rarely infection at the injection site may occur. All care will be taken to prevent these side effects. If therapy is given over a long time, atrophy and wasting in the muscle injected may occur. Occasionally the patient's become refractory to treatment because they develop antibodies to the toxin. In this event, therapy needs to be modified.  I have read the above information and consent to the administration of botulism toxin.    BOTOX PROCEDURE NOTE FOR MIGRAINE HEADACHE    Contraindications and precautions discussed with patient(above). Aseptic procedure was observed and patient tolerated procedure. Procedure performed by Dr. Georgia Dom  The condition has existed for more than 6 months, and pt does not have a diagnosis of ALS, Myasthenia Gravis or Lambert-Eaton Syndrome.  Risks and benefits of injections discussed and pt agrees to proceed with the procedure.  Written consent obtained  These injections are medically necessary. Pt  receives good benefits from these injections. These injections do not cause sedations or hallucinations which the oral therapies may cause.  Description of procedure:  The patient was placed in a sitting position. The standard protocol was used for Botox as follows, with 5 units of Botox injected at each site:   -Procerus muscle, midline  injection  -Corrugator muscle, bilateral injection  -Frontalis muscle, bilateral injection, with 2 sites each side, medial injection was performed in the upper one third of the frontalis muscle, in the region vertical from the medial inferior edge of the superior orbital rim. The lateral injection was again in the upper one third of the forehead vertically above the lateral limbus of the cornea, 1.5 cm lateral to the medial injection site.  -Temporalis muscle injection, 5 sites, bilaterally. The first injection was 3 cm above the tragus of the ear, second injection site was 1.5 cm to 3 cm up from the first injection site in line with the tragus of the ear. The third injection site was 1.5-3 cm forward between the first 2 injection sites. The fourth injection site was 1.5 cm posterior to the second injection site. 5th site laterally in the temporalis  muscleat the level of the outer canthus.  - Patient feels her clenching is a trigger for headaches. +5 units masseter bilaterally   - Patient feels the migraines are centered around the eyes +5 units bilaterally at the outer canthus in the orbicularis occuli  -Occipitalis muscle injection, 3 sites, bilaterally. The first injection was done one half way between the occipital protuberance and the tip of the mastoid process behind the ear. The second injection site was done lateral and superior to the first, 1 fingerbreadth from the first injection. The third injection site was 1 fingerbreadth superiorly and medially from the first injection site.  -Cervical paraspinal muscle injection, 2 sites, bilateral knee first injection site was 1 cm from the midline of the cervical spine, 3 cm inferior to the lower border of the occipital protuberance. The second injection site was 1.5 cm superiorly and laterally to the first injection site.  -Trapezius muscle injection was performed at 3 sites, bilaterally. The first injection site was in the upper trapezius muscle  halfway between the inflection point of the neck, and the acromion. The second injection site was one half way between the acromion and the first injection site. The third injection was done between the first injection site and the inflection point of the neck.   Will return for repeat injection in 3 months.   A 200 unit sof Botox was used, 45 U Botox not injected was wasted. The patient tolerated the procedure well, there were no complications of the above procedure.

## 2022-12-15 ENCOUNTER — Other Ambulatory Visit (HOSPITAL_COMMUNITY): Payer: Self-pay

## 2022-12-15 ENCOUNTER — Other Ambulatory Visit (HOSPITAL_BASED_OUTPATIENT_CLINIC_OR_DEPARTMENT_OTHER): Payer: Self-pay

## 2022-12-16 NOTE — Telephone Encounter (Signed)
PA approved.   The request has been approved. The authorization is effective from 12/11/2022 to 12/11/2023, as long as the member is enrolled in their current health plan. The request was reviewed and approved by a licensed clinical pharmacist. This request has been approved for a quantity of '2mg'$  per 28 days. Additional authorizations have been entered for the following: Wegovy 0.25 mg/0.5 mL allowing 96m per 28 days; please reference authorization [id number], Wegovy 0.5 mg/0.5 mL allowing 2636mper 28 days; please reference authorization [id number], Wegovy 1.7 mg/0.5 mL allowing 36m41mer 28 days; please reference authorization [id number], Wegovy 2.4 mg/0.5 mL allowing 36mL81mr 28 days; please reference authorization [id number] 12/11/2022 through 12/11/2023. A written notification letter will follow with additional details.

## 2022-12-17 ENCOUNTER — Other Ambulatory Visit (HOSPITAL_BASED_OUTPATIENT_CLINIC_OR_DEPARTMENT_OTHER): Payer: Self-pay

## 2022-12-17 ENCOUNTER — Encounter (HOSPITAL_BASED_OUTPATIENT_CLINIC_OR_DEPARTMENT_OTHER): Payer: Self-pay

## 2022-12-21 ENCOUNTER — Other Ambulatory Visit (HOSPITAL_BASED_OUTPATIENT_CLINIC_OR_DEPARTMENT_OTHER): Payer: Self-pay

## 2022-12-22 ENCOUNTER — Other Ambulatory Visit (HOSPITAL_BASED_OUTPATIENT_CLINIC_OR_DEPARTMENT_OTHER): Payer: Self-pay

## 2023-01-06 ENCOUNTER — Encounter: Payer: Self-pay | Admitting: Family

## 2023-01-06 ENCOUNTER — Other Ambulatory Visit (HOSPITAL_BASED_OUTPATIENT_CLINIC_OR_DEPARTMENT_OTHER): Payer: Self-pay

## 2023-01-06 MED ORDER — SEMAGLUTIDE-WEIGHT MANAGEMENT 1.7 MG/0.75ML ~~LOC~~ SOAJ
1.7000 mg | SUBCUTANEOUS | 1 refills | Status: DC
  Filled 2023-01-06: qty 3, 28d supply, fill #0
  Filled 2023-01-29: qty 3, 28d supply, fill #1

## 2023-01-14 ENCOUNTER — Other Ambulatory Visit: Payer: Self-pay

## 2023-01-14 ENCOUNTER — Ambulatory Visit (INDEPENDENT_AMBULATORY_CARE_PROVIDER_SITE_OTHER): Payer: 59 | Admitting: Family Medicine

## 2023-01-14 ENCOUNTER — Other Ambulatory Visit (HOSPITAL_BASED_OUTPATIENT_CLINIC_OR_DEPARTMENT_OTHER): Payer: Self-pay

## 2023-01-14 ENCOUNTER — Encounter: Payer: Self-pay | Admitting: Family Medicine

## 2023-01-14 VITALS — BP 127/89 | HR 93 | Temp 97.7°F | Ht 64.0 in | Wt 155.0 lb

## 2023-01-14 DIAGNOSIS — K589 Irritable bowel syndrome without diarrhea: Secondary | ICD-10-CM | POA: Diagnosis not present

## 2023-01-14 MED ORDER — HYOSCYAMINE SULFATE 0.125 MG PO TABS
0.1250 mg | ORAL_TABLET | ORAL | 0 refills | Status: DC | PRN
  Filled 2023-01-14: qty 10, 1d supply, fill #0

## 2023-01-14 NOTE — Progress Notes (Signed)
OFFICE VISIT  01/14/2023  CC:  Chief Complaint  Patient presents with   Pulled Muscle    Pt believes she pulled muscle in stomach. Has also been straining, last BM was today, stool was solid. She is currently on Wegovy   CC/HPI:  Patient is a 42 y.o. female who presents for acute pain in the left upper abdomen to mid epigastric region of abdomen that came on after getting up from the toilet this morning.  The pain has been present for about an hour and a half now. She does have chronic constipation but did not note any worse problem this morning than her normal.  Does not recall any unusual strain in the abdomen while having the BM. She has no nausea, vomiting, or diarrhea. No recent fever. No recent overuse of the abdomen or trunk or pelvis.  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;   2 stage embolization ACA supply to AVM @ [facility]  Sept 11th and 18th 2008 then craniotomy resection AVM 07-28-2007   Hypertension    Left ureteral stone    Migraines    Nephrolithiasis    left renal nonobstructive stone per CT 06-14-2019   Uterine fibroid     Past Surgical History:  Procedure Laterality Date   CEREBRAL EMBOLIZATION  Sept 11th and 18th, 2008   '@[facility]'$    2 staged embolization left ACA supply to AVM   CESAREAN SECTION  07/06/2009   CRANIOTOMY  07-29-2007   @ [facility]   resection left frontal AVM    Outpatient Medications Prior to Visit  Medication Sig Dispense Refill   amLODipine (NORVASC) 5 MG tablet Take 1 tablet (5 mg total) by mouth daily. 90 tablet 1   botulinum  toxin Type A (BOTOX) 200 units injection Provider to inject 155 units into the muscles of the head and neck every 12 weeks. Discard remainder. 1 each 3   Cholecalciferol (VITAMIN D3) 75 MCG (3000 UT) TABS Take 1 tablet by mouth daily. 30 tablet    docusate sodium (COLACE) 50 MG capsule Take 50 mg by mouth 2 (two) times daily.     famotidine (PEPCID) 20 MG tablet Take 20 mg by mouth daily as needed for heartburn or indigestion.      Ferrous Sulfate (IRON) 325 (65 Fe) MG TABS Take 1 tablet (325 mg total) by mouth daily. 30 tablet 0   fluticasone (FLONASE) 50 MCG/ACT nasal spray Place 2 sprays into both nostrils daily. 16 g 0   levocetirizine (XYZAL) 5 MG tablet TAKE 1 TABLET (5 MG TOTAL) BY MOUTH EVERY EVENING. 90 tablet 1   lisdexamfetamine (VYVANSE) 50 MG capsule Take 1 capsule (50 mg total) by mouth daily. 30 capsule 0   metoprolol succinate (TOPROL-XL) 50 MG 24 hr tablet Take 1 tablet (50 mg total) by mouth daily. Take with or immediately following a meal. 90 tablet 1   ondansetron (ZOFRAN-ODT) 4 MG disintegrating tablet Dissolve 1-2 tablets (4-8 mg total) by mouth every 8 (eight) hours as needed. 30 tablet 3   Rimegepant Sulfate (NURTEC) 75  MG TBDP DISSOLVE 1 TABLET BY MOUTH DAILY AS NEEDED FOR MIGRAINES. TAKE AS CLOSE TO ONSET OF MIGRAINE AS POSSIBLE. ONE DAILY MAXIMUM. 16 tablet 11   rizatriptan (MAXALT-MLT) 10 MG disintegrating tablet Take 1 tablet (10 mg total) by mouth as needed for migraine. May repeat in 2 hours if needed 9 tablet 11   Semaglutide-Weight Management 1.7 MG/0.75ML SOAJ Inject 1.7 mg into the skin once a week. 3 mL 1   No facility-administered medications prior to visit.    Allergies  Allergen Reactions   Codeine Other (See Comments)    REACTION: upset stomach once, but has taken it other times and been ok   Lisinopril Other (See Comments)    cough   Sulfa Antibiotics Hives    Review of Systems  As per HPI  PE:    01/14/2023    8:47 AM 08/26/2022    9:03 AM  06/04/2022   10:08 AM  Vitals with BMI  Height '5\' 4"'$     Weight 155 lbs 180 lbs 195 lbs  BMI [id number]  [id number]  Systolic [id number]  Diastolic 89 88 78  Pulse 93 89 77     Physical Exam  Exam chaperoned by Deveron Furlong, CMA. General: Alert and well-appearing. Abdomen is soft and nondistended.  She has some mild tenderness to palpation in the midepigastric and left side of her upper abdomen.  No guarding or rebound tenderness.  Palpating against contracted abdominal muscles relieves the tenderness a bit. Sounds are normal.  No hepatosplenomegaly, mass, or bruit.  LABS:  Last CBC Lab Results  Component Value Date   WBC 5.5 08/26/2022   HGB 13.4 08/26/2022   HCT 41.2 08/26/2022   MCV 88.4 08/26/2022   MCH 24.3 (L) 08/28/2020   RDW 13.9 08/26/2022   PLT 287.0 [id number]   Last metabolic panel Lab Results  Component Value Date   GLUCOSE 96 08/26/2022   NA 139 08/26/2022   K 4.0 08/26/2022   CL 105 08/26/2022   CO2 28 08/26/2022   BUN 15 08/26/2022   CREATININE 0.86 08/26/2022   GFRNONAA >60 06/14/2019   CALCIUM 9.2 08/26/2022   PROT 6.6 08/29/2021   ALBUMIN 4.3 08/29/2021   LABGLOB 2.5 06/13/2018   AGRATIO 1.6 06/13/2018   BILITOT 0.6 08/29/2021   ALKPHOS 80 08/29/2021   AST 19 08/29/2021   ALT 14 08/29/2021   ANIONGAP 9 06/14/2019      IMPRESSION AND PLAN:  Abdominal pain, suspect intestinal/colon spasm. Less likely abdominal wall strain. Reassured.  Monitor.  Trial of Levsin 0.125, 1-2 every 4 hours as needed. Signs/symptoms to call or return for were reviewed and pt expressed understanding.  An After Visit Summary was printed and given to the patient.  FOLLOW UP: Return if symptoms worsen or fail to improve.  Signed:  Crissie Sickles, MD           01/14/2023

## 2023-01-19 ENCOUNTER — Ambulatory Visit (INDEPENDENT_AMBULATORY_CARE_PROVIDER_SITE_OTHER): Payer: 59 | Admitting: Family Medicine

## 2023-01-19 ENCOUNTER — Other Ambulatory Visit (HOSPITAL_BASED_OUTPATIENT_CLINIC_OR_DEPARTMENT_OTHER): Payer: Self-pay

## 2023-01-19 ENCOUNTER — Encounter: Payer: Self-pay | Admitting: Family Medicine

## 2023-01-19 VITALS — BP 120/82 | HR 83 | Temp 98.1°F | Resp 16 | Ht 64.0 in | Wt 156.0 lb

## 2023-01-19 DIAGNOSIS — R319 Hematuria, unspecified: Secondary | ICD-10-CM | POA: Diagnosis not present

## 2023-01-19 DIAGNOSIS — R399 Unspecified symptoms and signs involving the genitourinary system: Secondary | ICD-10-CM | POA: Diagnosis not present

## 2023-01-19 DIAGNOSIS — Z87442 Personal history of urinary calculi: Secondary | ICD-10-CM

## 2023-01-19 LAB — POCT URINALYSIS DIPSTICK
Bilirubin, UA: 1
Glucose, UA: NEGATIVE
Ketones, UA: NEGATIVE
Nitrite, UA: NEGATIVE
Protein, UA: POSITIVE — AB
Spec Grav, UA: >=1.03 — AB (ref 1.010–1.025)
Urobilinogen, UA: 0.2 E.U./dL
pH, UA: 5.5 (ref 5.0–8.0)

## 2023-01-19 MED ORDER — NITROFURANTOIN MONOHYD MACRO 100 MG PO CAPS
100.0000 mg | ORAL_CAPSULE | Freq: Two times a day (BID) | ORAL | 0 refills | Status: AC
  Filled 2023-01-19: qty 14, 7d supply, fill #0

## 2023-01-19 NOTE — Progress Notes (Signed)
Established Patient Office Visit   Subjective:  Patient ID: Kaitlin Foster, female    DOB: [birth year]  Age: 42 y.o. MRN: [id number]  Chief Complaint  Patient presents with   Hematuria    Burning and small amount of blood    Hematuria Irritative symptoms do not include frequency or urgency. Associated symptoms include dysuria. Pertinent negatives include no abdominal pain or flank pain.   Encounter Diagnoses  Name Primary?   Hematuria, unspecified type Yes   UTI symptoms    History of kidney stones    Presents with a 1 day history of intermittent dysuria with hematuria.  She denies urgency frequency, nausea/vomiting or fever/chills.  History of renal lithiasis in 2020.  There was a 3 mm stone at the UVJ that had caused hydronephrosis of the left ureter.  Stenting was planned but patient passed the stone.  Another stone has been noted in the left upper kidney.  She has not had problems with kidney stones since.  She denies any sharp stabbing intermittent flank pain.   Review of Systems  Constitutional: Negative.   HENT: Negative.    Eyes:  Negative for blurred vision, discharge and redness.  Respiratory: Negative.    Cardiovascular: Negative.   Gastrointestinal:  Negative for abdominal pain.  Genitourinary:  Positive for dysuria and hematuria. Negative for flank pain, frequency and urgency.  Musculoskeletal: Negative.  Negative for myalgias.  Skin:  Negative for rash.  Neurological:  Negative for tingling, loss of consciousness and weakness.  Endo/Heme/Allergies:  Negative for polydipsia.     Current Outpatient Medications:    amLODipine (NORVASC) 5 MG tablet, Take 1 tablet (5 mg total) by mouth daily., Disp: 90 tablet, Rfl: 1   botulinum toxin Type A (BOTOX) 200 units injection, Provider to inject 155 units into the muscles of the head and neck every 12 weeks. Discard remainder., Disp: 1 each, Rfl: 3   Cholecalciferol (VITAMIN D3) 75 MCG (3000 UT) TABS, Take 1 tablet by  mouth daily., Disp: 30 tablet, Rfl:    docusate sodium (COLACE) 50 MG capsule, Take 50 mg by mouth 2 (two) times daily., Disp: , Rfl:    famotidine (PEPCID) 20 MG tablet, Take 20 mg by mouth daily as needed for heartburn or indigestion. , Disp: , Rfl:    Ferrous Sulfate (IRON) 325 (65 Fe) MG TABS, Take 1 tablet (325 mg total) by mouth daily., Disp: 30 tablet, Rfl: 0   fluticasone (FLONASE) 50 MCG/ACT nasal spray, Place 2 sprays into both nostrils daily., Disp: 16 g, Rfl: 0   levocetirizine (XYZAL) 5 MG tablet, TAKE 1 TABLET (5 MG TOTAL) BY MOUTH EVERY EVENING., Disp: 90 tablet, Rfl: 1   lisdexamfetamine (VYVANSE) 50 MG capsule, Take 1 capsule (50 mg total) by mouth daily., Disp: 30 capsule, Rfl: 0   metoprolol succinate (TOPROL-XL) 50 MG 24 hr tablet, Take 1 tablet (50 mg total) by mouth daily. Take with or immediately following a meal., Disp: 90 tablet, Rfl: 1   nitrofurantoin, macrocrystal-monohydrate, (MACROBID) 100 MG capsule, Take 1 capsule (100 mg total) by mouth 2 (two) times daily for 7 days., Disp: 14 capsule, Rfl: 0   ondansetron (ZOFRAN-ODT) 4 MG disintegrating tablet, Dissolve 1-2 tablets (4-8 mg total) by mouth every 8 (eight) hours as needed., Disp: 30 tablet, Rfl: 3   Rimegepant Sulfate (NURTEC) 75 MG TBDP, DISSOLVE 1 TABLET BY MOUTH DAILY AS NEEDED FOR MIGRAINES. TAKE AS CLOSE TO ONSET OF MIGRAINE AS POSSIBLE. ONE DAILY MAXIMUM., Disp: 16 tablet,  Rfl: 11   rizatriptan (MAXALT-MLT) 10 MG disintegrating tablet, Take 1 tablet (10 mg total) by mouth as needed for migraine. May repeat in 2 hours if needed, Disp: 9 tablet, Rfl: 11   Semaglutide-Weight Management 1.7 MG/0.75ML SOAJ, Inject 1.7 mg into the skin once a week., Disp: 3 mL, Rfl: 1   hyoscyamine (LEVSIN) 0.125 MG tablet, Take 1-2 tablets (0.125-0.25 mg total) by mouth every 4 (four) hours as needed. (Patient not taking: Reported on 01/19/2023), Disp: 10 tablet, Rfl: 0   Objective:     BP 120/82 (BP Location: Left Arm, Patient  Position: Sitting, Cuff Size: Normal)   Pulse 83   Temp 98.1 F (36.7 C) (Temporal)   Resp 16   Ht '5\' 4"'$  (1.626 m)   Wt 156 lb (70.8 kg)   LMP 01/08/2023 (Approximate)   SpO2 98%   BMI 26.78 kg/m    Physical Exam Constitutional:      General: She is not in acute distress.    Appearance: Normal appearance. She is not ill-appearing, toxic-appearing or diaphoretic.  HENT:     Head: Normocephalic and atraumatic.     Right Ear: External ear normal.     Left Ear: External ear normal.  Eyes:     General: No scleral icterus.       Right eye: No discharge.        Left eye: No discharge.     Extraocular Movements: Extraocular movements intact.     Conjunctiva/sclera: Conjunctivae normal.  Cardiovascular:     Rate and Rhythm: Normal rate and regular rhythm.  Pulmonary:     Effort: Pulmonary effort is normal. No respiratory distress.     Breath sounds: Normal breath sounds.  Abdominal:     General: Bowel sounds are normal. There is no distension.     Tenderness: There is no abdominal tenderness. There is no right CVA tenderness, left CVA tenderness or guarding.  Musculoskeletal:     Cervical back: No rigidity or tenderness.  Skin:    General: Skin is warm and dry.  Neurological:     Mental Status: She is alert and oriented to person, place, and time.  Psychiatric:        Mood and Affect: Mood normal.        Behavior: Behavior normal.      Results for orders placed or performed in visit on 01/19/23  POCT Urinalysis Dipstick  Result Value Ref Range   Color, UA Yellow    Clarity, UA     Glucose, UA Negative Negative   Bilirubin, UA 1    Ketones, UA neg    Spec Grav, UA >=1.030 (A) 1.010 - 1.025   Blood, UA 3+    pH, UA 5.5 5.0 - 8.0   Protein, UA Positive (A) Negative   Urobilinogen, UA 0.2 0.2 or 1.0 E.U./dL   Nitrite, UA neg    Leukocytes, UA Small (1+) (A) Negative   Appearance Clear    Odor no       The ASCVD Risk score (Arnett DK, et al., 2019) failed to  calculate for the following reasons:   Cannot find a previous HDL lab   Cannot find a previous total cholesterol lab    Assessment & Plan:   Hematuria, unspecified type -     POCT urinalysis dipstick  UTI symptoms -     Nitrofurantoin Monohyd Macro; Take 1 capsule (100 mg total) by mouth 2 (two) times daily for 7 days.  Dispense: 14  capsule; Refill: 0 -     Urine Culture  History of kidney stones -     Urine Culture    Return if symptoms worsen or fail to improve.  Increase fluid intake.  Take Nitrofurantoin and complete course of therapy.  May need to consider workup.  Libby Maw, MD

## 2023-01-19 NOTE — Addendum Note (Signed)
Addended by: Beryle Lathe S on: 01/19/2023 11:48 AM   Modules accepted: Orders

## 2023-01-20 ENCOUNTER — Other Ambulatory Visit (HOSPITAL_BASED_OUTPATIENT_CLINIC_OR_DEPARTMENT_OTHER): Payer: Self-pay

## 2023-01-20 ENCOUNTER — Other Ambulatory Visit: Payer: Self-pay

## 2023-01-21 LAB — URINE CULTURE
MICRO NUMBER:: 14681345
SPECIMEN QUALITY:: ADEQUATE

## 2023-01-25 ENCOUNTER — Encounter: Payer: Self-pay | Admitting: Family Medicine

## 2023-01-28 ENCOUNTER — Other Ambulatory Visit (HOSPITAL_BASED_OUTPATIENT_CLINIC_OR_DEPARTMENT_OTHER): Payer: Self-pay

## 2023-01-29 ENCOUNTER — Other Ambulatory Visit: Payer: Self-pay

## 2023-01-29 ENCOUNTER — Other Ambulatory Visit (HOSPITAL_BASED_OUTPATIENT_CLINIC_OR_DEPARTMENT_OTHER): Payer: Self-pay

## 2023-01-29 ENCOUNTER — Other Ambulatory Visit: Payer: Self-pay | Admitting: Family

## 2023-01-29 MED ORDER — LEVOCETIRIZINE DIHYDROCHLORIDE 5 MG PO TABS
ORAL_TABLET | Freq: Every evening | ORAL | 1 refills | Status: DC
  Filled 2023-01-29: qty 90, 90d supply, fill #0
  Filled 2023-05-21: qty 90, 90d supply, fill #1

## 2023-02-01 ENCOUNTER — Other Ambulatory Visit (HOSPITAL_BASED_OUTPATIENT_CLINIC_OR_DEPARTMENT_OTHER): Payer: Self-pay

## 2023-03-02 ENCOUNTER — Other Ambulatory Visit (HOSPITAL_COMMUNITY): Payer: Self-pay

## 2023-03-03 ENCOUNTER — Other Ambulatory Visit: Payer: Self-pay | Admitting: Family

## 2023-03-04 ENCOUNTER — Other Ambulatory Visit (HOSPITAL_BASED_OUTPATIENT_CLINIC_OR_DEPARTMENT_OTHER): Payer: Self-pay

## 2023-03-04 ENCOUNTER — Encounter: Payer: Self-pay | Admitting: Family

## 2023-03-04 MED ORDER — WEGOVY 1.7 MG/0.75ML ~~LOC~~ SOAJ
1.7000 mg | SUBCUTANEOUS | 1 refills | Status: DC
  Filled 2023-03-04: qty 3, 28d supply, fill #0

## 2023-03-08 ENCOUNTER — Ambulatory Visit (INDEPENDENT_AMBULATORY_CARE_PROVIDER_SITE_OTHER): Payer: 59 | Admitting: Family

## 2023-03-08 ENCOUNTER — Telehealth: Payer: Self-pay | Admitting: Family

## 2023-03-08 VITALS — BP 125/79 | HR 78 | Temp 98.0°F | Resp 16 | Ht 64.0 in | Wt 146.0 lb

## 2023-03-08 DIAGNOSIS — F900 Attention-deficit hyperactivity disorder, predominantly inattentive type: Secondary | ICD-10-CM | POA: Diagnosis not present

## 2023-03-08 DIAGNOSIS — F419 Anxiety disorder, unspecified: Secondary | ICD-10-CM

## 2023-03-08 DIAGNOSIS — D649 Anemia, unspecified: Secondary | ICD-10-CM | POA: Diagnosis not present

## 2023-03-08 DIAGNOSIS — G43711 Chronic migraine without aura, intractable, with status migrainosus: Secondary | ICD-10-CM | POA: Diagnosis not present

## 2023-03-08 DIAGNOSIS — Z0001 Encounter for general adult medical examination with abnormal findings: Secondary | ICD-10-CM | POA: Diagnosis not present

## 2023-03-08 DIAGNOSIS — I10 Essential (primary) hypertension: Secondary | ICD-10-CM

## 2023-03-08 DIAGNOSIS — J301 Allergic rhinitis due to pollen: Secondary | ICD-10-CM | POA: Diagnosis not present

## 2023-03-08 DIAGNOSIS — Z0000 Encounter for general adult medical examination without abnormal findings: Secondary | ICD-10-CM

## 2023-03-08 DIAGNOSIS — Z13220 Encounter for screening for lipoid disorders: Secondary | ICD-10-CM | POA: Diagnosis not present

## 2023-03-08 DIAGNOSIS — K219 Gastro-esophageal reflux disease without esophagitis: Secondary | ICD-10-CM | POA: Diagnosis not present

## 2023-03-08 LAB — COMPREHENSIVE METABOLIC PANEL
ALT: 9 U/L (ref 0–35)
AST: 16 U/L (ref 0–37)
Albumin: 4 g/dL (ref 3.5–5.2)
Alkaline Phosphatase: 55 U/L (ref 39–117)
BUN: 13 mg/dL (ref 6–23)
CO2: 26 mEq/L (ref 19–32)
Calcium: 9.2 mg/dL (ref 8.4–10.5)
Chloride: 105 mEq/L (ref 96–112)
Creatinine, Ser: 0.74 mg/dL (ref 0.40–1.20)
GFR: 101.22 mL/min (ref >60.00)
Glucose, Bld: 82 mg/dL (ref 70–99)
Potassium: 4.1 mEq/L (ref 3.5–5.1)
Sodium: 139 mEq/L (ref 135–145)
Total Bilirubin: 0.3 mg/dL (ref 0.2–1.2)
Total Protein: 6.4 g/dL (ref 6.0–8.3)

## 2023-03-08 LAB — CBC WITH DIFFERENTIAL/PLATELET
Basophils Absolute: 0 10*3/uL (ref 0.0–0.1)
Basophils Relative: 0.7 % (ref 0.0–3.0)
Eosinophils Absolute: 0.2 10*3/uL (ref 0.0–0.7)
Eosinophils Relative: 3.6 % (ref 0.0–5.0)
HCT: 39.1 % (ref 36.0–46.0)
Hemoglobin: 12.5 g/dL (ref 12.0–15.0)
Lymphocytes Relative: 32.4 % (ref 12.0–46.0)
Lymphs Abs: 1.6 10*3/uL (ref 0.7–4.0)
MCHC: 31.9 g/dL (ref 30.0–36.0)
MCV: 78.4 fl (ref 78.0–100.0)
Monocytes Absolute: 0.5 10*3/uL (ref 0.1–1.0)
Monocytes Relative: 9.5 % (ref 3.0–12.0)
Neutro Abs: 2.6 10*3/uL (ref 1.4–7.7)
Neutrophils Relative %: 53.8 % (ref 43.0–77.0)
Platelets: 311 10*3/uL (ref 150.0–400.0)
RBC: 4.99 Mil/uL (ref 3.87–5.11)
RDW: 16.3 % — ABNORMAL HIGH (ref 11.5–15.5)
WBC: 4.8 10*3/uL (ref 4.0–10.5)

## 2023-03-08 LAB — LIPID PANEL
Cholesterol: 125 mg/dL (ref 0–200)
HDL: 22.9 mg/dL — ABNORMAL LOW (ref >39.00)
LDL Cholesterol: 85 mg/dL (ref 0–99)
NonHDL: 102.2
Total CHOL/HDL Ratio: 5
Triglycerides: 84 mg/dL (ref 0.0–149.0)
VLDL: 16.8 mg/dL (ref 0.0–40.0)

## 2023-03-08 LAB — TSH: TSH: 0.66 u[IU]/mL (ref 0.35–5.50)

## 2023-03-08 NOTE — Assessment & Plan Note (Signed)
Continues to have symptoms on Wegovy. Recommended otc nexium once daily for the next month, then can try coming off when she discontinues wegovy.

## 2023-03-08 NOTE — Assessment & Plan Note (Addendum)
Wt Readings from Last 3 Encounters:  03/08/23 146 lb (66.2 kg)  01/19/23 156 lb (70.8 kg)  01/14/23 155 lb (70.3 kg)   Discussed importance of adding exercise. She is thinking of restarting weight watchers when she comes off of [place]. Tetanus and pap up to date- will request copy of pap from GYN.

## 2023-03-08 NOTE — Assessment & Plan Note (Deleted)
Stable, management per neuro.  

## 2023-03-08 NOTE — Assessment & Plan Note (Signed)
Continues xyzal year round which is helpful for her.

## 2023-03-08 NOTE — Assessment & Plan Note (Signed)
Takes iron once daily.  Will change to QOD.

## 2023-03-08 NOTE — Progress Notes (Signed)
Subjective:   By signing my name below, I, [name], attest that this documentation has been prepared under the direction and in the presence of Sandford Craze, NP. 03/08/2023   Patient ID: Kaitlin Foster, female    DOB: 1981/04/10, 42 y.o.   MRN: [id number]  Chief Complaint  Patient presents with   Annual Exam    HPI Patient is in today for a comprehensive physical exam.   Wegovy: She has 1 month dosage of wegoy left. She has lost weight while taking it. She is planning taking another weight loss medication after she stops. She is not exercising regularly and is planning to start. She is also considering joining weight watchers to help improve her diet.  Wt Readings from Last 3 Encounters:  03/08/23 146 lb (66.2 kg)  01/19/23 156 lb (70.8 kg)  01/14/23 155 lb (70.3 kg)   Blood pressure: Her blood pressure is doing well during this visit. She continues taking 5 mg amlodipine daily PO, 50 mg metoprolol succinate and reports no new issues while taking them.  BP Readings from Last 3 Encounters:  03/08/23 125/79  01/19/23 120/82  01/14/23 127/89   Pulse Readings from Last 3 Encounters:  03/08/23 78  01/19/23 83  01/14/23 93   Migraines: She is managing her migraines well while taking 75 mg nurtex prn and regularly receiving botox injections.   Reflux: She continues having reflux at this time. She is not taking medication to manage it. She had a few episodes the past couple months when food would not go down her esophagus. She is planning on taking OTC Nexium to help manage her symptoms.   Allergies: She continues taking OTC xyzal to manage her allergies.   Vyvanse: She continues taking 50 mg vyvanes daily PO and finds her focus wanes in the afternoon. Overall she feels it is not effective in sharpening her concentration.   Anxiety: Her anxiety is stable at this time.   Iron: She continues taking iron supplements regularly. She reports having occasional nausea after  taking it.   Hemorrhoids: She reports her external hemorrhoids have been flaring more frequently since she started taking wegovy. She is taking colace and miralax as needed to help manage her symptoms. She is interested monitoring her symptoms since she is stopping wegovy next month.   Acute: She denies fever, unexpected weight change, adenopathy, new moles, sinus pain, sore throat, visual disturbance, chest pain, palpitations, leg swelling, cough, shortness of breath, wheezing, nausea, vomiting, diarrhea, constipation, blood in stool, dysuria, frequency, hematuria, new muscle pain, new joint pain, headaches, depression or anxiety at this time.   Social history: She has no changes to her family medical history.   Immunizations: She is UTD on tetanus vaccine. She is UTD on flu vaccine. She is not interested in HIV or hepatitis C screening.   Diet: She is maintaining a healthy diet.   Exercise: She is not exercising regularly at this time but is planning to start.   Pap Smear: She completes her pap smears at her GYN's office. She reports her last pap smear was last summer. She is due and is planning on scheduling another appointment.   Mammogram: Last completed 11/16/2022. Results are normal. Repeat in 1 year.   Dental: She is UTD on dental care.   Vision: She is due for vision care.    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;   2 stage embolization ACA supply to AVM @ [facility]  Sept 11th and 18th 2008 then craniotomy resection AVM 07-28-2007   Hypertension    Left  ureteral stone    Migraines    Nephrolithiasis    left renal nonobstructive stone per CT 06-14-2019   Uterine fibroid     Past Surgical History:  Procedure Laterality Date   CEREBRAL EMBOLIZATION  Sept 11th and 18th, 2008   @[facility]    2 staged embolization left ACA supply to AVM   CESAREAN SECTION  07/06/2009   CRANIOTOMY  07-29-2007   @ [facility]   resection left frontal AVM    Family History  Problem Relation Age of Onset   Hypertension Father    Alcohol abuse Father    Arthritis Father    Obesity Father    Hypertension Mother    Arthritis Mother    Migraines Mother    Obesity Mother    Cancer Mother        sinus tumor   Migraines Sister    Ulcerative colitis Sister    Migraines Maternal Aunt    ADD / ADHD Son    Cancer Maternal Grandmother        colon diagnosed at age 46   Dementia Maternal Grandmother    Heart disease Neg Hx     Social History   Socioeconomic History   Marital status: Married    Spouse name: Thad   Number of children: 1   Years of education: Not on file   Highest education level: Associate degree: academic program  Occupational History   Occupation: Charity fundraiser  Tobacco Use   Smoking status: Never   Smokeless tobacco: Never  Vaping Use   Vaping Use: Never used  Substance and Sexual Activity   Alcohol use: No    Alcohol/week: 0.0 standard drinks of alcohol   Drug use: No   Sexual activity: Yes    Birth control/protection: Condom  Other Topics Concern   Not on file  Social History Narrative   Regular exercise: no regular exercise   Caffeine use:  1-2 coffee daily   Son born 2010   Works as a Engineer, 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 90 tablet 1   botulinum toxin Type A (BOTOX) 200 units injection Provider to inject 155 units into the muscles of the head and neck every 12 weeks. Discard remainder. 1 each 3   Cholecalciferol (VITAMIN D3) 75 MCG (3000 UT) TABS Take 1 tablet by mouth daily. 30 tablet    docusate sodium (COLACE) 50 MG capsule Take 50 mg by mouth  2 (two) times daily.     famotidine (PEPCID) 20 MG tablet Take 20 mg by mouth daily as needed for heartburn or indigestion.      Ferrous Sulfate (IRON) 325 (65 Fe) MG TABS Take 1 tablet (325 mg total) by mouth daily. 30 tablet 0   fluticasone (FLONASE) 50 MCG/ACT nasal spray Place 2 sprays into both nostrils daily. 16 g 0   hyoscyamine (LEVSIN) 0.125 MG tablet Take 1-2 tablets (0.125-0.25 mg total) by mouth every 4 (four) hours as needed. 10 tablet 0   levocetirizine (XYZAL) 5 MG tablet TAKE 1 TABLET (5 MG TOTAL) BY MOUTH EVERY EVENING. 90 tablet 1   lisdexamfetamine (VYVANSE) 50 MG capsule Take 1 capsule (50 mg total) by mouth daily. 30 capsule 0   metoprolol succinate (TOPROL-XL) 50 MG 24 hr tablet Take 1 tablet (50 mg total) by mouth daily. Take with or immediately following a meal. 90 tablet 1   ondansetron (ZOFRAN-ODT) 4 MG disintegrating tablet Dissolve 1-2 tablets (4-8 mg total) by mouth every 8 (eight) hours as needed. 30 tablet 3   Rimegepant Sulfate (NURTEC) 75 MG TBDP DISSOLVE 1 TABLET BY MOUTH DAILY AS NEEDED FOR MIGRAINES. TAKE AS CLOSE TO ONSET OF MIGRAINE AS POSSIBLE. ONE DAILY MAXIMUM. 16 tablet 11   rizatriptan (MAXALT-MLT) 10 MG disintegrating tablet Take 1 tablet (10 mg total) by mouth as needed for migraine. May repeat in 2 hours if needed 9 tablet 11   Semaglutide-Weight Management (WEGOVY) 1.7 MG/0.75ML SOAJ Inject 1.7 mg into the skin once a week. 3 mL 1   No facility-administered medications  prior to visit.    Allergies  Allergen Reactions   Codeine Other (See Comments)    REACTION: upset stomach once, but has taken it other times and been ok   Lisinopril Other (See Comments)    cough   Sulfa Antibiotics Hives    Review of Systems  Constitutional:  Negative for fever.       (-)unexpected weight change (-)Adenopathy  HENT:  Negative for congestion, sinus pain and sore throat.   Eyes:        (-)Visual disturbance  Respiratory:  Negative for cough, shortness of breath and wheezing.   Cardiovascular:  Negative for chest pain, palpitations and leg swelling.  Gastrointestinal:  Negative for blood in stool, constipation, diarrhea, nausea and vomiting.  Genitourinary:  Negative for dysuria, frequency and hematuria.  Musculoskeletal:        (-)new muscle pain (-)new joint pain  Skin:        (-)new moles  Neurological:  Negative for dizziness and headaches.  Psychiatric/Behavioral:  Negative for depression. The patient is not nervous/anxious.        Objective:    Physical Exam Constitutional:      General: She is not in acute distress.    Appearance: Normal appearance. She is not ill-appearing.  HENT:     Head: Normocephalic and atraumatic.     Right Ear: Tympanic membrane, ear canal and external ear normal.     Left Ear: Tympanic membrane, ear canal and external ear normal.  Eyes:     Extraocular Movements: Extraocular movements intact.     Right eye: No nystagmus.     Left eye: No nystagmus.     Pupils: Pupils are equal, round, and reactive to light.  Neck:     Thyroid: No thyroid tenderness.  Cardiovascular:     Rate and Rhythm: Normal rate and regular rhythm.     Heart sounds:  Normal heart sounds. No murmur heard.    No gallop.  Pulmonary:     Effort: Pulmonary effort is normal. No respiratory distress.     Breath sounds: Normal breath sounds. No wheezing or rales.  Abdominal:     General: There is no distension.     Palpations: Abdomen is soft.      Tenderness: There is no abdominal tenderness. There is no guarding.  Musculoskeletal:     Comments: 5/5 strength in both upper and lower extremities  Lymphadenopathy:     Cervical: No cervical adenopathy.  Skin:    General: Skin is warm and dry.  Neurological:     Mental Status: She is alert and oriented to person, place, and time.     Deep Tendon Reflexes:     Reflex Scores:      Patellar reflexes are 2+ on the right side and 2+ on the left side. Psychiatric:        Judgment: Judgment normal.     BP 125/79 (BP Location: Right Arm, Patient Position: Sitting, Cuff Size: Small)   Pulse 78   Temp 98 F (36.7 C) (Oral)   Resp 16   Ht 5\' 4"  (1.626 m)   Wt 146 lb (66.2 kg)   SpO2 100%   BMI 25.06 kg/m  Wt Readings from Last 3 Encounters:  03/08/23 146 lb (66.2 kg)  01/19/23 156 lb (70.8 kg)  01/14/23 155 lb (70.3 kg)       Assessment & Plan:  ADHD, predominantly inattentive type Assessment & Plan: Fair control with Vyvanse. Continue same. Controlled substance contract is updated.   Orders: -     DRUG MONITORING, PANEL 8 WITH CONFIRMATION, URINE  Preventative health care Assessment & Plan: Wt Readings from Last 3 Encounters:  03/08/23 146 lb (66.2 kg)  01/19/23 156 lb (70.8 kg)  01/14/23 155 lb (70.3 kg)   Discussed importance of adding exercise. She is thinking of restarting weight watchers when she comes off of [place]. Tetanus and pap up to date- will request copy of pap from GYN.    Orders: -     Lipid panel -     TSH  Primary hypertension Assessment & Plan: BP Readings from Last 3 Encounters:  03/08/23 125/79  01/19/23 120/82  01/14/23 127/89   At goal on metoprolol 50mg  and amlodipine 5mg . Continue same.   Orders: -     Comprehensive metabolic panel  Gastroesophageal reflux disease, unspecified whether esophagitis present Assessment & Plan: Continues to have symptoms on Wegovy. Recommended otc nexium once daily for the next month, then can try coming  off when she discontinues wegovy.    Seasonal allergic rhinitis due to pollen Assessment & Plan: Continues xyzal year round which is helpful for her.    Anxiety Assessment & Plan: Reports this has been stable.    Anemia, unspecified type Assessment & Plan: Takes iron once daily.  Will change to QOD.  Orders: -     CBC with Differential/Platelet -     Iron, TIBC and Ferritin Panel  Chronic migraine without aura, with intractable migraine, so stated, with status migrainosus    I, Lemont Fillers, NP, personally preformed the services described in this documentation.  All medical record entries made by the scribe were at my direction and in my presence.  I have reviewed the chart and discharge instructions (if applicable) and agree that the record reflects my personal performance and is accurate and complete. 03/08/2023   I,[name],acting  as a Education administrator for Nance Pear, NP.,have documented all relevant documentation on the behalf of Nance Pear, NP,as directed by  Nance Pear, NP while in the presence of Nance Pear, NP.   Nance Pear, NP

## 2023-03-08 NOTE — Telephone Encounter (Signed)
Please call [facility] and request copy of pap.

## 2023-03-08 NOTE — Assessment & Plan Note (Signed)
BP Readings from Last 3 Encounters:  03/08/23 125/79  01/19/23 120/82  01/14/23 127/89   At goal on metoprolol 50mg  and amlodipine 5mg . Continue same.

## 2023-03-08 NOTE — Assessment & Plan Note (Addendum)
Fair control with Vyvanse. Continue same. Controlled substance contract is updated.

## 2023-03-08 NOTE — Telephone Encounter (Signed)
Electronic request made 

## 2023-03-08 NOTE — Assessment & Plan Note (Signed)
Controlled on Nurtec PRN.  She continues botox injections per neurology.

## 2023-03-08 NOTE — Assessment & Plan Note (Signed)
Reports this has been stable.

## 2023-03-09 ENCOUNTER — Other Ambulatory Visit (HOSPITAL_COMMUNITY): Payer: Self-pay

## 2023-03-09 ENCOUNTER — Encounter: Payer: Self-pay | Admitting: Neurology

## 2023-03-09 ENCOUNTER — Ambulatory Visit (INDEPENDENT_AMBULATORY_CARE_PROVIDER_SITE_OTHER): Payer: 59 | Admitting: Neurology

## 2023-03-09 DIAGNOSIS — G43711 Chronic migraine without aura, intractable, with status migrainosus: Secondary | ICD-10-CM

## 2023-03-09 LAB — IRON,TIBC AND FERRITIN PANEL
%SAT: 7 % (calc) — ABNORMAL LOW (ref 16–45)
Ferritin: 7 ng/mL — ABNORMAL LOW (ref 16–154)
Iron: 23 ug/dL — ABNORMAL LOW (ref 40–190)
TIBC: 334 mcg/dL (calc) (ref 250–450)

## 2023-03-09 MED ORDER — ONABOTULINUMTOXINA 200 UNITS IJ SOLR
155.0000 [IU] | Freq: Once | INTRAMUSCULAR | Status: AC
Start: 2023-03-09 — End: 2023-03-09
  Administered 2023-03-09: 155 [IU] via INTRAMUSCULAR

## 2023-03-09 NOTE — Progress Notes (Signed)
Botox- 200 units x 1 vial Lot: [id number] Expiration: 04/2025 NDC: 4540-9811-91  Bacteriostatic 0.9% Sodium Chloride- 4 mL  Lot: [id number] Expiration: 09/2024 NDC: 21308-657-84  Dx:  G43.711 S/P  Witnessed by Alveria Apley. CMA

## 2023-03-09 NOTE — Patient Instructions (Addendum)
Consent Form  Botulism Toxin Injection For Chronic Migraine 03/09/2023 stable hasn't really had but 2 migraines a month or less 12/14/2022: stable, doin ggreat 09/21/2022: 2 migraines a month, takes nurtec and ondansetron, doing great, gave a zavzpret sample did not like   06/22/2022: stable 03/19/2022: stable, doing great 12/25/2021: stable  05/27/2021: Stable  Interval history 09/11/2021: Baseline daily headaches and16 migraine days. Since last botox she has had only 2 total very mild migraines a month and acute management works, nurtec works with zofran, she had side effects to zembrace, also maxalt works, takes with zofran. More than 25 free headache days a month, exceptional improvement!.  She is very happy. She is on adderall now but not causing headache.   Orders: [facility] or  for dry needling for cervical and lumbar/lumbosacral dry needling for myofascial disease in the past       Reviewed orally with patient, additionally signature is on file:  Botulism toxin has been approved by the Federal drug administration for treatment of chronic migraine. Botulism toxin does not cure chronic migraine and it may not be effective in some patients.  The administration of botulism toxin is accomplished by injecting a small amount of toxin into the muscles of the neck and head. Dosage must be titrated for each individual. Any benefits resulting from botulism toxin tend to wear off after 3 months with a repeat injection required if benefit is to be maintained. Injections are usually done every 3-4 months with maximum effect peak achieved by about 2 or 3 weeks. Botulism toxin is expensive and you should be sure of what costs you will incur resulting from the injection.  The side effects of botulism toxin use for chronic migraine may include:   -Transient, and usually mild, facial weakness with facial injections  -Transient, and usually mild, head or neck weakness with  head/neck injections  -Reduction or loss of forehead facial animation due to forehead muscle weakness  -Eyelid drooping  -Dry eye  -Pain at the site of injection or bruising at the site of injection  -Double vision  -Potential unknown long term risks  Contraindications: You should not have Botox if you are pregnant, nursing, allergic to albumin, have an infection, skin condition, or muscle weakness at the site of the injection, or have myasthenia gravis, Lambert-Eaton syndrome, or ALS.  It is also possible that as with any injection, there may be an allergic reaction or no effect from the medication. Reduced effectiveness after repeated injections is sometimes seen and rarely infection at the injection site may occur. All care will be taken to prevent these side effects. If therapy is given over a long time, atrophy and wasting in the muscle injected may occur. Occasionally the patient's become refractory to treatment because they develop antibodies to the toxin. In this event, therapy needs to be modified.  I have read the above information and consent to the administration of botulism toxin.    BOTOX PROCEDURE NOTE FOR MIGRAINE HEADACHE    Contraindications and precautions discussed with patient(above). Aseptic procedure was observed and patient tolerated procedure. Procedure performed by Dr. Artemio Aly  The condition has existed for more than 6 months, and pt does not have a diagnosis of ALS, Myasthenia Gravis or Lambert-Eaton Syndrome.  Risks and benefits of injections discussed and pt agrees to proceed with the procedure.  Written consent obtained  These injections are medically necessary. Pt  receives good benefits from these injections. These injections do not cause sedations  or hallucinations which the oral therapies may cause.  Description of procedure:  The patient was placed in a sitting position. The standard protocol was used for Botox as follows, with 5 units of Botox  injected at each site:   -Procerus muscle, midline injection  -Corrugator muscle, bilateral injection  -Frontalis muscle, bilateral injection, with 2 sites each side, medial injection was performed in the upper one third of the frontalis muscle, in the region vertical from the medial inferior edge of the superior orbital rim. The lateral injection was again in the upper one third of the forehead vertically above the lateral limbus of the cornea, 1.5 cm lateral to the medial injection site.  -Temporalis muscle injection, 5 sites, bilaterally. The first injection was 3 cm above the tragus of the ear, second injection site was 1.5 cm to 3 cm up from the first injection site in line with the tragus of the ear. The third injection site was 1.5-3 cm forward between the first 2 injection sites. The fourth injection site was 1.5 cm posterior to the second injection site. 5th site laterally in the temporalis  muscleat the level of the outer canthus.  - Patient feels her clenching is a trigger for headaches. +5 units masseter bilaterally   - Patient feels the migraines are centered around the eyes +5 units bilaterally at the outer canthus in the orbicularis occuli  -Occipitalis muscle injection, 3 sites, bilaterally. The first injection was done one half way between the occipital protuberance and the tip of the mastoid process behind the ear. The second injection site was done lateral and superior to the first, 1 fingerbreadth from the first injection. The third injection site was 1 fingerbreadth superiorly and medially from the first injection site.  -Cervical paraspinal muscle injection, 2 sites, bilateral knee first injection site was 1 cm from the midline of the cervical spine, 3 cm inferior to the lower border of the occipital protuberance. The second injection site was 1.5 cm superiorly and laterally to the first injection site.  -Trapezius muscle injection was performed at 3 sites, bilaterally. The  first injection site was in the upper trapezius muscle halfway between the inflection point of the neck, and the acromion. The second injection site was one half way between the acromion and the first injection site. The third injection was done between the first injection site and the inflection point of the neck.   Will return for repeat injection in 3 months.   A 200 unit sof Botox was used, 45 U Botox not injected was wasted. The patient tolerated the procedure well, there were no complications of the above procedure.

## 2023-03-10 LAB — DRUG MONITORING, PANEL 8 WITH CONFIRMATION, URINE
6 Acetylmorphine: NEGATIVE ng/mL (ref <10)
Alcohol Metabolites: NEGATIVE ng/mL (ref <500)
Amphetamine: 4701 ng/mL — ABNORMAL HIGH (ref <250)
Amphetamines: POSITIVE ng/mL — AB (ref <500)
Benzodiazepines: NEGATIVE ng/mL (ref <100)
Buprenorphine, Urine: NEGATIVE ng/mL (ref <5)
Cocaine Metabolite: NEGATIVE ng/mL (ref <150)
Creatinine: 215.7 mg/dL (ref >=20.0)
MDMA: NEGATIVE ng/mL (ref <500)
Marijuana Metabolite: NEGATIVE ng/mL (ref <20)
Methamphetamine: NEGATIVE ng/mL (ref <250)
Opiates: NEGATIVE ng/mL (ref <100)
Oxidant: NEGATIVE ug/mL (ref <200)
Oxycodone: NEGATIVE ng/mL (ref <100)
pH: 5.8 (ref 4.5–9.0)

## 2023-03-10 LAB — DM TEMPLATE

## 2023-04-07 ENCOUNTER — Telehealth: Payer: Self-pay

## 2023-04-07 ENCOUNTER — Other Ambulatory Visit (HOSPITAL_COMMUNITY): Payer: Self-pay

## 2023-04-07 NOTE — Telephone Encounter (Signed)
Pharmacy Patient Advocate Encounter   Received notification from [facility] that prior authorization for Nurtec 75MG  dispersible tablets is required/requested.   PA submitted on 04/07/2023 to (ins) MEDIM[employer] via CoverMyMeds Key or [facility]) confirmation #  [id number] Status is pending

## 2023-04-09 ENCOUNTER — Other Ambulatory Visit: Payer: Self-pay | Admitting: Family

## 2023-04-09 ENCOUNTER — Other Ambulatory Visit: Payer: Self-pay

## 2023-04-09 NOTE — Telephone Encounter (Signed)
Requesting: vyvanse Contract:08/26/22 UDS:08/26/22 Last Visit:03/08/23 Next Visit:06/07/23 Last Refill:12/09/22  Please Advise

## 2023-04-10 ENCOUNTER — Other Ambulatory Visit (HOSPITAL_BASED_OUTPATIENT_CLINIC_OR_DEPARTMENT_OTHER): Payer: Self-pay

## 2023-04-10 MED ORDER — LISDEXAMFETAMINE DIMESYLATE 50 MG PO CAPS
50.0000 mg | ORAL_CAPSULE | Freq: Every day | ORAL | 0 refills | Status: DC
  Filled 2023-04-10: qty 30, 30d supply, fill #0

## 2023-04-10 NOTE — Telephone Encounter (Signed)
Patient Advocate Encounter  Prior Authorization for Nurtec 75MG  dispersible tablets has been approved.    PA# [id number] Insurance [employer] ePA Form Effective dates: 04/09/2023 through 04/04/2024

## 2023-04-12 ENCOUNTER — Other Ambulatory Visit (HOSPITAL_BASED_OUTPATIENT_CLINIC_OR_DEPARTMENT_OTHER): Payer: Self-pay

## 2023-04-15 ENCOUNTER — Ambulatory Visit
Admission: RE | Admit: 2023-04-15 | Discharge: 2023-04-15 | Disposition: A | Discharge status: Home / Self Care | Payer: 59 | Source: Ambulatory Visit | Attending: Nurse Practitioner | Admitting: Nurse Practitioner

## 2023-04-15 VITALS — BP 114/74 | HR 86 | Temp 98.9°F | Resp 16

## 2023-04-15 DIAGNOSIS — J020 Streptococcal pharyngitis: Secondary | ICD-10-CM | POA: Diagnosis not present

## 2023-04-15 LAB — POCT RAPID STREP A (OFFICE): Rapid Strep A Screen: POSITIVE — AB

## 2023-04-15 MED ORDER — AMOXICILLIN 500 MG PO CAPS
500.0000 mg | ORAL_CAPSULE | Freq: Two times a day (BID) | ORAL | 0 refills | Status: AC
Start: 2023-04-15 — End: 2023-04-25

## 2023-04-15 NOTE — ED Triage Notes (Signed)
Pt presents to UC w/ c/o sore throat, nasal drainage, chills since this morning.

## 2023-04-15 NOTE — ED Provider Notes (Signed)
[facility]    CSN: [id number] Arrival date & time: 04/15/23  1715      History   Chief Complaint Chief Complaint  Patient presents with   Sore Throat    Entered by patient    HPI Kaitlin Foster is a 42 y.o. female  presents for evaluation of URI symptoms for 1 days. Patient reports associated symptoms of sore throat, chills, nasal drainage. Denies N/V/D, fevers, cough, ear pain, body ache, shortness of breath. Patient does let have a hx of asthma or smoking.   Pt has no other concerns at this time.    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;   2 stage embolization ACA supply to AVM @ [facility]  Sept 11th and 18th 2008 then craniotomy resection AVM 07-28-2007   Hypertension    Left ureteral stone    Migraines    Nephrolithiasis    left renal nonobstructive stone per CT 06-14-2019   Uterine fibroid     Patient Active Problem List   Diagnosis Date Noted   Fatigue 02/19/2021   Palpitations 02/19/2021   Uterine fibroid    Migraines    Hypertension    History of arteriovenous malformation (AVM)    GERD (gastroesophageal reflux disease)    Anxiety    ADHD, 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   Chronic migraine without aura, with intractable migraine, so stated, with status migrainosus 06/13/2018   Vitamin D deficiency 08/30/2017   Insulin resistance 08/30/2017   Preventative health care 07/01/2017   Allergic rhinitis 06/25/2015   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   Anemia 03/21/2014   General medical examination 02/29/2012    Anxiety and depression 02/29/2012   DYSLIPIDEMIA 10/12/2008    Past Surgical History:  Procedure Laterality Date   CEREBRAL EMBOLIZATION  Sept 11th and 18th, 2008   @[facility]    2 staged embolization left ACA supply to AVM   CESAREAN SECTION  07/06/2009   CRANIOTOMY  07-29-2007   @ [facility]   resection left frontal AVM    OB History     Gravida  1   Para      Term      Preterm      AB      Living  1      SAB      IAB      Ectopic      Multiple      Live Births               Home Medications    Prior to Admission medications   Medication Sig Start Date End Date Taking? Authorizing Provider  amoxicillin (AMOXIL) 500 MG capsule Take 1 capsule (500 mg total) by mouth 2 (two) times daily for 10 days. 04/15/23 04/25/23 Yes Radford Pax, NP  amLODipine (NORVASC) 5 MG tablet Take 1 tablet (5 mg total) by mouth daily. 08/26/22 08/26/23  Sandford Craze, NP  botulinum toxin Type A (BOTOX) 200 units injection Provider to inject 155 units into the muscles of the head  and neck every 12 weeks. Discard remainder. 12/10/22   Anson Fret, MD  Cholecalciferol (VITAMIN D3) 75 MCG (3000 UT) TABS Take 1 tablet by mouth daily. 12/02/21   Sandford Craze, NP  docusate sodium (COLACE) 50 MG capsule Take 50 mg by mouth 2 (two) times daily.    [provider]  famotidine (PEPCID) 20 MG tablet Take 20 mg by mouth daily as needed for heartburn or indigestion.     [provider]  Ferrous Sulfate (IRON) 325 (65 Fe) MG TABS Take 1 tablet (325 mg total) by mouth daily. 08/30/20   Sandford Craze, NP  fluticasone (FLONASE) 50 MCG/ACT nasal spray Place 2 sprays into both nostrils daily. 05/31/22   Eustace Moore, MD  hyoscyamine (LEVSIN) 0.125 MG tablet Take 1-2 tablets (0.125-0.25 mg total) by mouth every 4 (four) hours as needed. 01/14/23   McGowen, Maryjean Morn, MD  levocetirizine (XYZAL) 5 MG tablet TAKE 1 TABLET (5 MG TOTAL) BY MOUTH EVERY EVENING. 01/29/23    Sandford Craze, NP  lisdexamfetamine (VYVANSE) 50 MG capsule Take 1 capsule (50 mg total) by mouth daily. 04/10/23   Sandford Craze, NP  metoprolol succinate (TOPROL-XL) 50 MG 24 hr tablet Take 1 tablet (50 mg total) by mouth daily. Take with or immediately following a meal. 08/26/22   Sandford Craze, NP  ondansetron (ZOFRAN-ODT) 4 MG disintegrating tablet Dissolve 1-2 tablets (4-8 mg total) by mouth every 8 (eight) hours as needed. 09/21/22   Anson Fret, MD  Rimegepant Sulfate (NURTEC) 75 MG TBDP DISSOLVE 1 TABLET BY MOUTH DAILY AS NEEDED FOR MIGRAINES. TAKE AS CLOSE TO ONSET OF MIGRAINE AS POSSIBLE. ONE DAILY MAXIMUM. 10/26/22 10/26/23  Anson Fret, MD  rizatriptan (MAXALT-MLT) 10 MG disintegrating tablet Take 1 tablet (10 mg total) by mouth as needed for migraine. May repeat in 2 hours if needed 05/02/19   Anson Fret, MD  Semaglutide-Weight Management (WEGOVY) 1.7 MG/0.75ML SOAJ Inject 1.7 mg into the skin once a week. 03/04/23   Sandford Craze, NP    Family History Family History  Problem Relation Age of Onset   Hypertension Father    Alcohol abuse Father    Arthritis Father    Obesity Father    Hypertension Mother    Arthritis Mother    Migraines Mother    Obesity Mother    Cancer Mother        sinus tumor   Migraines Sister    Ulcerative colitis Sister    Migraines Maternal Aunt    ADD / ADHD Son    Cancer Maternal Grandmother        colon diagnosed at age 73   Dementia Maternal Grandmother    Heart disease Neg Hx     Social History Social History   Tobacco Use   Smoking status: Never   Smokeless tobacco: Never  Vaping Use   Vaping Use: Never used  Substance Use Topics   Alcohol use: No    Alcohol/week: 0.0 standard drinks of alcohol   Drug use: No     Allergies   Codeine, Lisinopril, and Sulfa antibiotics   Review of Systems Review of Systems  Constitutional:  Positive for chills.  HENT:  Positive for rhinorrhea and sore  throat.      Physical Exam Triage Vital Signs ED Triage Vitals  Enc Vitals Group     BP 04/15/23 1749 114/74     Pulse Rate 04/15/23 1749 86     Resp 04/15/23 1749 16  Temp 04/15/23 1749 98.9 F (37.2 C)     Temp Source 04/15/23 1749 Oral     SpO2 04/15/23 1749 98 %     Weight --      Height --      Head Circumference --      Peak Flow --      Pain Score 04/15/23 1753 1     Pain Loc --      Pain Edu? --      Excl. in GC? --    No data found.  Updated Vital Signs BP 114/74 (BP Location: Right Arm)   Pulse 86   Temp 98.9 F (37.2 C) (Oral)   Resp 16   LMP 04/08/2023 (Approximate)   SpO2 98%   Visual Acuity Right Eye Distance:   Left Eye Distance:   Bilateral Distance:    Right Eye Near:   Left Eye Near:    Bilateral Near:     Physical Exam Vitals and nursing note reviewed.  Constitutional:      General: Kaitlin Foster is not in acute distress.    Appearance: Kaitlin Foster is well-developed. Kaitlin Foster is not ill-appearing.  HENT:     Head: Normocephalic and atraumatic.     Right Ear: Tympanic membrane and ear canal normal.     Left Ear: Tympanic membrane and ear canal normal.     Nose: Rhinorrhea present. No congestion.     Mouth/Throat:     Mouth: Mucous membranes are moist.     Pharynx: Oropharynx is clear. Uvula midline. Posterior oropharyngeal erythema present.     Tonsils: No tonsillar exudate or tonsillar abscesses.  Eyes:     Conjunctiva/sclera: Conjunctivae normal.     Pupils: Pupils are equal, round, and reactive to light.  Cardiovascular:     Rate and Rhythm: Normal rate and regular rhythm.     Heart sounds: Normal heart sounds.  Pulmonary:     Effort: Pulmonary effort is normal.     Breath sounds: Normal breath sounds.  Musculoskeletal:     Cervical back: Normal range of motion and neck supple.  Lymphadenopathy:     Cervical: No cervical adenopathy.  Skin:    General: Skin is warm and dry.  Neurological:     General: No focal deficit present.     Mental  Status: Kaitlin Foster is alert and oriented to person, place, and time.  Psychiatric:        Mood and Affect: Mood normal.        Behavior: Behavior normal.      UC Treatments / Results  Labs (all labs ordered are listed, but only abnormal results are displayed) Labs Reviewed  POCT RAPID STREP A (OFFICE) - Abnormal; Notable for the following components:      Result Value   Rapid Strep A Screen Positive (*)    All other components within normal limits    EKG   Radiology No results found.  Procedures Procedures (including critical care time)  Medications Ordered in UC Medications - No data to display  Initial Impression / Assessment and Plan / UC Course  I have reviewed the triage vital signs and the nursing notes.  Pertinent labs & imaging results that were available during my care of the patient were reviewed by me and considered in my medical decision making (see chart for details).     Positive rapid strep.  Start amoxicillin Salt water gargles and warm liquids.  OTC analgesics as needed PCP follow-up if symptoms do not  improve ER precautions reviewed and patient verbalized understanding Final Clinical Impressions(s) / UC Diagnoses   Final diagnoses:  Streptococcal sore throat     Discharge Instructions      Start amoxicillin twice daily for 10 days Salt water gargles and warm liquids such as teas and honey He may also take over-the-counter ibuprofen or Tylenol as needed for throat pain Rest and fluids Follow-up with your PCP if your symptoms do not improve Please go to the ER if you develop any worsening symptoms I hope you feel better soon!   ED Prescriptions     Medication Sig Dispense Auth. Provider   amoxicillin (AMOXIL) 500 MG capsule Take 1 capsule (500 mg total) by mouth 2 (two) times daily for 10 days. 20 capsule Radford Pax, NP      PDMP not reviewed this encounter.   Radford Pax, NP 04/15/23 [phone]

## 2023-04-15 NOTE — Discharge Instructions (Signed)
Start amoxicillin twice daily for 10 days Salt water gargles and warm liquids such as teas and honey He may also take over-the-counter ibuprofen or Tylenol as needed for throat pain Rest and fluids Follow-up with your PCP if your symptoms do not improve Please go to the ER if you develop any worsening symptoms I hope you feel better soon!

## 2023-04-29 ENCOUNTER — Encounter: Payer: Self-pay | Admitting: Family Medicine

## 2023-04-29 ENCOUNTER — Ambulatory Visit: Payer: 59 | Admitting: Family Medicine

## 2023-04-29 ENCOUNTER — Ambulatory Visit (INDEPENDENT_AMBULATORY_CARE_PROVIDER_SITE_OTHER): Payer: 59 | Admitting: Family Medicine

## 2023-04-29 VITALS — BP 114/80 | HR 71 | Temp 97.9°F | Wt 148.6 lb

## 2023-04-29 DIAGNOSIS — M542 Cervicalgia: Secondary | ICD-10-CM

## 2023-04-29 NOTE — Progress Notes (Signed)
OFFICE VISIT  04/29/2023  CC:  Chief Complaint  Patient presents with   Neck Pain    Left sided neck pain that's been going on for 3 days. The pain goes up to her ear. She was diagnosed with strep on June 6th and was given an antibiotic.    Patient is a 42 y.o. female who presents for neck pain.  HPI: Onset 3 days ago with pain in the left anterolateral neck region. No swelling.  No fever or malaise. She has some mild throat discomfort on the left but cannot tell if it is from the muscles in the neck or from in the actual throat. No difficulty swallowing. She did have strep a couple of weeks ago and got treated with amoxicillin.   She recently went on a long car drive and thinks maybe she slept in the car in a way that may have strained her neck.    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;   2 stage embolization ACA supply to AVM @ [facility]  Sept 11th and 18th 2008 then craniotomy resection AVM 07-28-2007   Hypertension    Left ureteral stone    Migraines    Nephrolithiasis    left renal nonobstructive stone per CT 06-14-2019   Uterine fibroid     Past Surgical History:  Procedure Laterality Date   CEREBRAL EMBOLIZATION  Sept 11th and 18th, 2008   @[facility]    2 staged embolization left ACA supply to AVM   CESAREAN SECTION  07/06/2009   CRANIOTOMY  07-29-2007   @ [facility]   resection left frontal AVM    Outpatient Medications Prior to Visit  Medication Sig Dispense Refill   amLODipine (NORVASC) 5 MG tablet Take 1 tablet (5 mg total) by mouth daily. 90 tablet 1    botulinum toxin Type A (BOTOX) 200 units injection Provider to inject 155 units into the muscles of the head and neck every 12 weeks. Discard remainder. 1 each 3   Cholecalciferol (VITAMIN D3) 75 MCG (3000 UT) TABS Take 1 tablet by mouth daily. 30 tablet    docusate sodium (COLACE) 50 MG capsule Take 50 mg by mouth 2 (two) times daily.     famotidine (PEPCID) 20 MG tablet Take 20 mg by mouth daily as needed for heartburn or indigestion.      Ferrous Sulfate (IRON) 325 (65 Fe) MG TABS Take 1 tablet (325 mg total) by mouth daily. 30 tablet 0   fluticasone (FLONASE) 50 MCG/ACT nasal spray Place 2 sprays into both nostrils daily. 16 g 0   levocetirizine (XYZAL) 5 MG tablet TAKE 1 TABLET (5 MG TOTAL) BY MOUTH EVERY EVENING. 90 tablet 1   lisdexamfetamine (VYVANSE) 50 MG capsule Take 1 capsule (50 mg total) by mouth daily. 30 capsule 0   metoprolol succinate (TOPROL-XL) 50 MG 24 hr tablet Take 1 tablet (50 mg total) by mouth daily. Take with or immediately following a meal. 90 tablet 1   ondansetron (ZOFRAN-ODT) 4 MG disintegrating tablet Dissolve 1-2 tablets (4-8 mg total) by mouth every  8 (eight) hours as needed. 30 tablet 3   Rimegepant Sulfate (NURTEC) 75 MG TBDP DISSOLVE 1 TABLET BY MOUTH DAILY AS NEEDED FOR MIGRAINES. TAKE AS CLOSE TO ONSET OF MIGRAINE AS POSSIBLE. ONE DAILY MAXIMUM. 16 tablet 11   rizatriptan (MAXALT-MLT) 10 MG disintegrating tablet Take 1 tablet (10 mg total) by mouth as needed for migraine. May repeat in 2 hours if needed 9 tablet 11   hyoscyamine (LEVSIN) 0.125 MG tablet Take 1-2 tablets (0.125-0.25 mg total) by mouth every 4 (four) hours as needed. (Patient not taking: Reported on 04/29/2023) 10 tablet 0   Semaglutide-Weight Management (WEGOVY) 1.7 MG/0.75ML SOAJ Inject 1.7 mg into the skin once a week. (Patient not taking: Reported on 04/29/2023) 3 mL 1   No facility-administered medications prior to visit.    Allergies  Allergen Reactions   Codeine Other (See Comments)     REACTION: upset stomach once, but has taken it other times and been ok   Lisinopril Other (See Comments)    cough   Sulfa Antibiotics Hives    Review of Systems  As per HPI  PE:    04/29/2023   10:45 AM 04/15/2023    5:49 PM 03/08/2023    9:59 AM  Vitals with BMI  Height   5\' 4"   Weight 148 lbs 10 oz  146 lbs  BMI   25.05  Systolic 114 114 161  Diastolic 80 74 79  Pulse 71 86 78     Physical Exam  Gen: Alert, well appearing.  Patient is oriented to person, place, time, and situation. AFFECT: pleasant, lucid thought and speech. Left ear without swelling externally or in the canal.  Tympanic membrane normal. No generalized or focal swelling in the neck at any location.  Neck range of motion fully intact. No erythema.  Mild discomfort to palpation over the sternocleidomastoid region on the left.  Submandibular gland does not feel enlarged, or tender or indurated on the left. Carotids 2+ bilaterally, no bruits. Oropharynx appears normal.   LABS:  Last CBC Lab Results  Component Value Date   WBC 4.8 03/08/2023   HGB 12.5 03/08/2023   HCT 39.1 03/08/2023   MCV 78.4 03/08/2023   MCH 24.3 (L) 08/28/2020   RDW 16.3 (H) 03/08/2023   PLT 311.0 03/08/2023   Last metabolic panel Lab Results  Component Value Date   GLUCOSE 82 03/08/2023   NA 139 03/08/2023   K 4.1 03/08/2023   CL 105 03/08/2023   CO2 26 03/08/2023   BUN 13 03/08/2023   CREATININE 0.74 03/08/2023   GFRNONAA >60 06/14/2019   CALCIUM 9.2 03/08/2023   PROT 6.4 03/08/2023   ALBUMIN 4.0 03/08/2023   LABGLOB 2.5 06/13/2018   AGRATIO 1.6 06/13/2018   BILITOT 0.3 03/08/2023   ALKPHOS 55 03/08/2023   AST 16 03/08/2023   ALT 9 03/08/2023   ANIONGAP 9 06/14/2019   Last hemoglobin A1c Lab Results  Component Value Date   HGBA1C 5.2 08/28/2020   IMPRESSION AND PLAN:  Left-sided anterolateral neck pain. Suspect muscular etiology. Reassured. Continue ibuprofen as needed and monitor for any new symptoms or  worsening.  An After Visit Summary was printed and given to the patient.  FOLLOW UP: No follow-ups on file.  Signed:  Santiago Bumpers, MD           04/29/2023

## 2023-05-03 NOTE — Telephone Encounter (Signed)
Renewed auth via CMM, status is pending (Key: [id number]).

## 2023-05-05 NOTE — Telephone Encounter (Signed)
Received new approval, Berkley Harvey: [id number] (05/05/23-05/03/24).

## 2023-05-19 ENCOUNTER — Other Ambulatory Visit (HOSPITAL_COMMUNITY): Payer: Self-pay

## 2023-05-21 ENCOUNTER — Other Ambulatory Visit: Payer: Self-pay

## 2023-05-21 ENCOUNTER — Other Ambulatory Visit: Payer: Self-pay | Admitting: Family

## 2023-05-21 ENCOUNTER — Other Ambulatory Visit (HOSPITAL_COMMUNITY): Payer: Self-pay

## 2023-05-22 ENCOUNTER — Other Ambulatory Visit: Payer: Self-pay

## 2023-05-22 MED ORDER — AMLODIPINE BESYLATE 5 MG PO TABS
5.0000 mg | ORAL_TABLET | Freq: Every day | ORAL | 1 refills | Status: DC
  Filled 2023-05-22 – 2023-06-07 (×2): qty 90, 90d supply, fill #0
  Filled 2023-08-31: qty 90, 90d supply, fill #1

## 2023-05-22 MED ORDER — LISDEXAMFETAMINE DIMESYLATE 50 MG PO CAPS
50.0000 mg | ORAL_CAPSULE | Freq: Every day | ORAL | 0 refills | Status: DC
  Filled 2023-05-22 – 2023-06-07 (×2): qty 30, 30d supply, fill #0

## 2023-05-24 ENCOUNTER — Other Ambulatory Visit: Payer: Self-pay

## 2023-05-24 ENCOUNTER — Other Ambulatory Visit (HOSPITAL_BASED_OUTPATIENT_CLINIC_OR_DEPARTMENT_OTHER): Payer: Self-pay

## 2023-05-28 ENCOUNTER — Other Ambulatory Visit: Payer: Self-pay

## 2023-06-02 ENCOUNTER — Ambulatory Visit: Payer: 59 | Admitting: Neurology

## 2023-06-02 DIAGNOSIS — G43711 Chronic migraine without aura, intractable, with status migrainosus: Secondary | ICD-10-CM

## 2023-06-02 MED ORDER — ONABOTULINUMTOXINA 200 UNITS IJ SOLR
155.0000 [IU] | Freq: Once | INTRAMUSCULAR | Status: AC
Start: 2023-06-02 — End: 2023-06-02
  Administered 2023-06-02: 155 [IU] via INTRAMUSCULAR

## 2023-06-02 NOTE — Progress Notes (Signed)
Consent Form Botulism Toxin Injection For Chronic Migraine   06/02/2023: acutely nurtec helps. Zofran roo. aseline daily headaches and16 migraine days.Now only max 4-5 migraine days a month and > 50% decrease severity and increased treatability 12/14/2022: stable, doin ggreat 09/21/2022: 2 migraines a month, takes nurtec and ondansetron, doing great, gave a zavzpret sample did not like   06/22/2022: stable 03/19/2022: stable, doing great 12/25/2021: stable  05/27/2021: Stable  Interval history 09/11/2021: Baseline daily headaches and16 migraine days. Since last botox she has had only 2 total very mild migraines a month and acute management works, nurtec works with zofran, she had side effects to zembrace, also maxalt works, takes with zofran. More than 25 free headache days a month, exceptional improvement!.  She is very happy. She is on adderall now but not causing headache.   Orders: [facility] or [place] for dry needling for cervical and lumbar/lumbosacral dry needling for myofascial disease in the past       Reviewed orally with patient, additionally signature is on file:  Botulism toxin has been approved by the Federal drug administration for treatment of chronic migraine. Botulism toxin does not cure chronic migraine and it may not be effective in some patients.  The administration of botulism toxin is accomplished by injecting a small amount of toxin into the muscles of the neck and head. Dosage must be titrated for each individual. Any benefits resulting from botulism toxin tend to wear off after 3 months with a repeat injection required if benefit is to be maintained. Injections are usually done every 3-4 months with maximum effect peak achieved by about 2 or 3 weeks. Botulism toxin is expensive and you should be sure of what costs you will incur resulting from the injection.  The side effects of botulism toxin use for chronic migraine may include:   -Transient,  and usually mild, facial weakness with facial injections  -Transient, and usually mild, head or neck weakness with head/neck injections  -Reduction or loss of forehead facial animation due to forehead muscle weakness  -Eyelid drooping  -Dry eye  -Pain at the site of injection or bruising at the site of injection  -Double vision  -Potential unknown long term risks  Contraindications: You should not have Botox if you are pregnant, nursing, allergic to albumin, have an infection, skin condition, or muscle weakness at the site of the injection, or have myasthenia gravis, Lambert-Eaton syndrome, or ALS.  It is also possible that as with any injection, there may be an allergic reaction or no effect from the medication. Reduced effectiveness after repeated injections is sometimes seen and rarely infection at the injection site may occur. All care will be taken to prevent these side effects. If therapy is given over a long time, atrophy and wasting in the muscle injected may occur. Occasionally the patient's become refractory to treatment because they develop antibodies to the toxin. In this event, therapy needs to be modified.  I have read the above information and consent to the administration of botulism toxin.    BOTOX PROCEDURE NOTE FOR MIGRAINE HEADACHE    Contraindications and precautions discussed with patient(above). Aseptic procedure was observed and patient tolerated procedure. Procedure performed by Dr. Artemio Aly  The condition has existed for more than 6 months, and pt does not have a diagnosis of ALS, Myasthenia Gravis or Lambert-Eaton Syndrome.  Risks and benefits of injections discussed and pt agrees to proceed with the procedure.  Written consent obtained  These injections are  medically necessary. Pt  receives good benefits from these injections. These injections do not cause sedations or hallucinations which the oral therapies may cause.  Description of procedure:  The patient  was placed in a sitting position. The standard protocol was used for Botox as follows, with 5 units of Botox injected at each site:   -Procerus muscle, midline injection  -Corrugator muscle, bilateral injection  -Frontalis muscle, bilateral injection, with 2 sites each side, medial injection was performed in the upper one third of the frontalis muscle, in the region vertical from the medial inferior edge of the superior orbital rim. The lateral injection was again in the upper one third of the forehead vertically above the lateral limbus of the cornea, 1.5 cm lateral to the medial injection site.  -Temporalis muscle injection, 5 sites, bilaterally. The first injection was 3 cm above the tragus of the ear, second injection site was 1.5 cm to 3 cm up from the first injection site in line with the tragus of the ear. The third injection site was 1.5-3 cm forward between the first 2 injection sites. The fourth injection site was 1.5 cm posterior to the second injection site. 5th site laterally in the temporalis  muscleat the level of the outer canthus.  - Patient feels her clenching is a trigger for headaches. +5 units masseter bilaterally   - Patient feels the migraines are centered around the eyes +5 units bilaterally at the outer canthus in the orbicularis occuli  -Occipitalis muscle injection, 3 sites, bilaterally. The first injection was done one half way between the occipital protuberance and the tip of the mastoid process behind the ear. The second injection site was done lateral and superior to the first, 1 fingerbreadth from the first injection. The third injection site was 1 fingerbreadth superiorly and medially from the first injection site.  -Cervical paraspinal muscle injection, 2 sites, bilateral knee first injection site was 1 cm from the midline of the cervical spine, 3 cm inferior to the lower border of the occipital protuberance. The second injection site was 1.5 cm superiorly and  laterally to the first injection site.  -Trapezius muscle injection was performed at 3 sites, bilaterally. The first injection site was in the upper trapezius muscle halfway between the inflection point of the neck, and the acromion. The second injection site was one half way between the acromion and the first injection site. The third injection was done between the first injection site and the inflection point of the neck.   Will return for repeat injection in 3 months.   A 200 unit sof Botox was used, 45 U Botox not injected was wasted. The patient tolerated the procedure well, there were no complications of the above procedure.

## 2023-06-02 NOTE — Progress Notes (Signed)
Botox- 200 units x 1 vial Lot: [id number] Expiration: 10/2025 NDC: 5409-8119-14  Bacteriostatic 0.9% Sodium Chloride- 4 mL  Lot: [id number] Expiration: 09/09/2024 NDC: 2130-8657-84  Dx: O96.295 S/P  Witnessed by Maryjean Ka

## 2023-06-03 ENCOUNTER — Other Ambulatory Visit (HOSPITAL_BASED_OUTPATIENT_CLINIC_OR_DEPARTMENT_OTHER): Payer: Self-pay

## 2023-06-07 ENCOUNTER — Ambulatory Visit (INDEPENDENT_AMBULATORY_CARE_PROVIDER_SITE_OTHER): Payer: 59 | Admitting: Family

## 2023-06-07 ENCOUNTER — Other Ambulatory Visit (HOSPITAL_BASED_OUTPATIENT_CLINIC_OR_DEPARTMENT_OTHER): Payer: Self-pay

## 2023-06-07 ENCOUNTER — Other Ambulatory Visit: Payer: Self-pay | Admitting: Oncology

## 2023-06-07 ENCOUNTER — Telehealth: Payer: Self-pay

## 2023-06-07 VITALS — BP 126/76 | HR 77 | Temp 98.1°F | Resp 16 | Wt 155.0 lb

## 2023-06-07 DIAGNOSIS — I10 Essential (primary) hypertension: Secondary | ICD-10-CM

## 2023-06-07 DIAGNOSIS — F900 Attention-deficit hyperactivity disorder, predominantly inattentive type: Secondary | ICD-10-CM | POA: Diagnosis not present

## 2023-06-07 DIAGNOSIS — F32A Depression, unspecified: Secondary | ICD-10-CM

## 2023-06-07 DIAGNOSIS — E663 Overweight: Secondary | ICD-10-CM | POA: Diagnosis not present

## 2023-06-07 DIAGNOSIS — K219 Gastro-esophageal reflux disease without esophagitis: Secondary | ICD-10-CM

## 2023-06-07 DIAGNOSIS — D649 Anemia, unspecified: Secondary | ICD-10-CM | POA: Diagnosis not present

## 2023-06-07 DIAGNOSIS — J301 Allergic rhinitis due to pollen: Secondary | ICD-10-CM | POA: Diagnosis not present

## 2023-06-07 DIAGNOSIS — G43109 Migraine with aura, not intractable, without status migrainosus: Secondary | ICD-10-CM | POA: Diagnosis not present

## 2023-06-07 DIAGNOSIS — Z006 Encounter for examination for normal comparison and control in clinical research program: Secondary | ICD-10-CM

## 2023-06-07 DIAGNOSIS — E66811 Obesity, class 1: Secondary | ICD-10-CM | POA: Insufficient documentation

## 2023-06-07 DIAGNOSIS — E559 Vitamin D deficiency, unspecified: Secondary | ICD-10-CM | POA: Diagnosis not present

## 2023-06-07 DIAGNOSIS — F419 Anxiety disorder, unspecified: Secondary | ICD-10-CM | POA: Diagnosis not present

## 2023-06-07 LAB — CBC WITH DIFFERENTIAL/PLATELET
Basophils Absolute: 0 10*3/uL (ref 0.0–0.1)
Basophils Relative: 0.8 % (ref 0.0–3.0)
Eosinophils Absolute: 0.2 10*3/uL (ref 0.0–0.7)
Eosinophils Relative: 3.6 % (ref 0.0–5.0)
HCT: 34.7 % — ABNORMAL LOW (ref 36.0–46.0)
Hemoglobin: 10.5 g/dL — ABNORMAL LOW (ref 12.0–15.0)
Lymphocytes Relative: 28.9 % (ref 12.0–46.0)
Lymphs Abs: 1.7 10*3/uL (ref 0.7–4.0)
MCHC: 30.2 g/dL (ref 30.0–36.0)
MCV: 73.2 fl — ABNORMAL LOW (ref 78.0–100.0)
Monocytes Absolute: 0.5 10*3/uL (ref 0.1–1.0)
Monocytes Relative: 9.5 % (ref 3.0–12.0)
Neutro Abs: 3.3 10*3/uL (ref 1.4–7.7)
Neutrophils Relative %: 57.2 % (ref 43.0–77.0)
Platelets: 321 10*3/uL (ref 150.0–400.0)
RBC: 4.75 Mil/uL (ref 3.87–5.11)
RDW: 16.5 % — ABNORMAL HIGH (ref 11.5–15.5)
WBC: 5.8 10*3/uL (ref 4.0–10.5)

## 2023-06-07 MED ORDER — CONTRAVE 8-90 MG PO TB12
1.0000 | ORAL_TABLET | Freq: Two times a day (BID) | ORAL | 1 refills | Status: DC
  Filled 2023-06-07: qty 60, 30d supply, fill #0
  Filled 2023-07-15: qty 60, 30d supply, fill #1

## 2023-06-07 MED ORDER — LEVOCETIRIZINE DIHYDROCHLORIDE 5 MG PO TABS
5.0000 mg | ORAL_TABLET | Freq: Every evening | ORAL | 4 refills | Status: DC
  Filled 2023-06-07 – 2023-08-31 (×2): qty 90, 90d supply, fill #0
  Filled 2023-10-25 – 2024-01-12 (×2): qty 90, 90d supply, fill #1

## 2023-06-07 NOTE — Patient Instructions (Signed)
VISIT SUMMARY:  During our visit, we discussed your concerns about weight gain after stopping Wegovy, your ADHD symptoms, migraines, allergies, and reflux. We also reviewed your current medications and discussed potential changes to better manage your symptoms.  YOUR PLAN:  -OBESITY: You've noticed increased hunger and body pain since stopping Wegovy. We discussed trying Contrave, a medication that can help control hunger, but we need to check if your insurance will cover it. If not, we might consider Topamax, another medication that can help control hunger.  -ADHD: You've found that Vyvanse helps improve your focus, but you don't take it every day. I recommend taking Vyvanse daily, as it could also help reduce binge eating.  -IRON DEFICIENCY: You've had difficulty tolerating iron supplements due to side effects. We'll check your iron levels today to see if we need to adjust your treatment.  -VITAMIN D DEFICIENCY: You've been inconsistent with your Vitamin D supplement. Please restart vit D over the counter 3000 int units once daily.   -ALLERGIC RHINITIS: Your allergy symptoms are controlled with daily Xyzal. Continue taking Xyzal as needed.  -MIGRAINES: Your migraines are well controlled with Botox and Nurtec, with only 1-2 migraines per month. Continue with your current migraine management.  -HYPERTENSION: Your blood pressure is well-controlled on your current regimen of amlodipine 5mg  and atenolol 50mg . Continue with your current blood pressure management.  -GERD: Your reflux symptoms are well-controlled with occasional use of Pepcid. Continue taking Pepcid as needed.  INSTRUCTIONS:  Please continue taking your current medications as prescribed. We'll check your iron and Vitamin D levels today. We'll also try to get authorization for Contrave from your insurance. If it's not covered, we'll consider Topamax. Please start taking Vyvanse daily. We've scheduled a colonoscopy for you, and we'll  follow up in 6 months.

## 2023-06-07 NOTE — Assessment & Plan Note (Signed)
  BP Readings from Last 3 Encounters:  06/07/23 126/76  04/29/23 114/80  04/15/23 114/74   Stable on metoprolol and amlodipine.

## 2023-06-07 NOTE — Assessment & Plan Note (Signed)
Stable with rare use of Pepcid.

## 2023-06-07 NOTE — Assessment & Plan Note (Signed)
Stable

## 2023-06-07 NOTE — Assessment & Plan Note (Signed)
Stable on zyxal. Continue same.

## 2023-06-07 NOTE — Assessment & Plan Note (Signed)
Recommended vit D 3000 international units daily

## 2023-06-07 NOTE — Assessment & Plan Note (Signed)
Reports stable- being treated with Botox by neurology.

## 2023-06-07 NOTE — Telephone Encounter (Signed)
PA initiated via Covermymeds; KEY: [id number]. Awaiting determination.

## 2023-06-07 NOTE — Progress Notes (Signed)
Subjective:     Patient ID: Kaitlin Foster, female    DOB: 09/11/1981, 42 y.o.   MRN: [id number]  Chief Complaint  Patient presents with   ADHD    Here for follow up   Hypertension    Here for follow up    Hypertension    Discussed the use of AI scribe software for clinical note transcription with the patient, who gave verbal consent to proceed.  History of Present Illness   The patient, with a history of ADHD, migraines, allergies, and reflux, presents with weight gain after stopping Wegovy. She reports that her hunger has not been well controlled since stopping the medication, and she is inquires about the possibility of trying Contrave.    In terms of her ADHD, the patient reports that her symptoms are better managed when she takes Vyvanse, although she does not take it every day. She has been trying other strategies to manage her ADHD symptoms, such as moving to a different area, standing up, or taking a walk when she feels distracted. She also keeps a list to stay on task and tries to limit distractions by putting her phone away.  The patient's migraines have been well controlled, with only one to two migraines a month, usually related to her menstrual cycle. She recently received Botox for her migraines and takes Nurtec and Zofran for symptom management.  The patient's allergies are currently quite severe, and she takes Xyzal daily to manage her symptoms. She also occasionally experiences reflux, although this has definitely improved since she lost weight.  She only takes Pepcid as needed for indigestion.  The patient also mentions a son with autism, although it is unclear if this is a concern for her own health or if she is seeking advice for her son's care.     Wt Readings from Last 3 Encounters:  06/07/23 155 lb (70.3 kg)  04/29/23 148 lb 9.6 oz (67.4 kg)  03/08/23 146 lb (66.2 kg)        There are no preventive care reminders to display for this patient.  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;   2 stage embolization ACA supply to AVM @ [facility]  Sept 11th and 18th 2008 then craniotomy resection AVM 07-28-2007   Hypertension    Left ureteral stone    Migraines    Nephrolithiasis    left renal nonobstructive stone per CT 06-14-2019   Uterine fibroid     Past Surgical History:  Procedure Laterality Date   CEREBRAL EMBOLIZATION  Sept 11th and 18th, 2008   @[facility]    2 staged embolization left ACA supply to AVM   CESAREAN SECTION  07/06/2009   CRANIOTOMY  07-29-2007   @ [facility]   resection left frontal AVM    Family History  Problem Relation Age of Onset   Hypertension Father    Alcohol abuse Father    Arthritis Father    Obesity Father    Hypertension Mother    Arthritis Mother    Migraines Mother  Obesity Mother    Cancer Mother        sinus tumor   Migraines Sister    Ulcerative colitis Sister    Migraines Maternal Aunt    ADD / ADHD Son    Cancer Maternal Grandmother        colon diagnosed at age 68   Dementia Maternal Grandmother    Heart disease Neg Hx     Social History   Socioeconomic History   Marital status: Married    Spouse name: Thad   Number of children: 1   Years of education: Not on file   Highest education level: Associate degree: academic program  Occupational History   Occupation: Charity fundraiser  Tobacco Use   Smoking status: Never   Smokeless tobacco: Never  Vaping Use   Vaping status: Never Used  Substance and Sexual Activity   Alcohol use: No    Alcohol/week: 0.0 standard drinks of alcohol    Drug use: No   Sexual activity: Yes    Birth control/protection: Condom  Other Topics Concern   Not on file  Social History Narrative   Regular exercise: no regular exercise   Caffeine use:  1-2 coffee daily   Son born 2010   Works as a Engineer, 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)   Married   Lives at home with her husband and child      Social Determinants of Health   Financial Resource Strain: Low Risk  (06/06/2023)   Overall Financial Resource Strain (CARDIA)    Difficulty of Paying Living Expenses: Not hard at all  Food Insecurity: No Food Insecurity (06/06/2023)   Hunger Vital Sign    Worried About Running Out of Food in the Last Year: Never true    Ran Out of Food in the Last Year: Never true  Transportation Needs: No Transportation Needs (06/06/2023)   PRAPARE - Administrator, Civil Service (Medical): No    Lack of Transportation (Non-Medical): No  Physical Activity: Not on file  Stress: Not on file  Social Connections: Socially Integrated (06/06/2023)   Social Connection and Isolation Panel [NHANES]    Frequency of Communication with Friends and Family: More than three times a week    Frequency of Social Gatherings with Friends and Family: Twice a week    Attends Religious Services: More than 4 times per year    Active Member of [employer] or Organizations: Yes    Attends Engineer, structural: More than 4 times per year    Marital Status: Married  Catering manager Violence: Not on file    Outpatient Medications Prior to Visit  Medication Sig Dispense Refill   amLODipine (NORVASC) 5 MG tablet Take 1 tablet (5 mg total) by mouth daily. 90 tablet 1   botulinum toxin Type A (BOTOX) 200 units injection Provider to inject 155 units into the muscles of the head and neck every 12 weeks. Discard remainder. 1 each 3   Cholecalciferol (VITAMIN D3) 75 MCG (3000 UT) TABS Take 1 tablet by mouth daily. 30 tablet    docusate sodium (COLACE) 50 MG capsule Take 50 mg by mouth 2  (two) times daily.     famotidine (PEPCID) 20 MG tablet Take 20 mg by mouth daily as needed for heartburn or indigestion.      Ferrous Sulfate (IRON) 325 (65 Fe) MG TABS Take 1 tablet (325 mg total) by mouth daily. 30 tablet 0   fluticasone (FLONASE) 50 MCG/ACT nasal spray Place 2  sprays into both nostrils daily. 16 g 0   lisdexamfetamine (VYVANSE) 50 MG capsule Take 1 capsule (50 mg total) by mouth daily. 30 capsule 0   metoprolol succinate (TOPROL-XL) 50 MG 24 hr tablet Take 1 tablet (50 mg total) by mouth daily. Take with or immediately following a meal. 90 tablet 1   ondansetron (ZOFRAN-ODT) 4 MG disintegrating tablet Dissolve 1-2 tablets (4-8 mg total) by mouth every 8 (eight) hours as needed. 30 tablet 3   Rimegepant Sulfate (NURTEC) 75 MG TBDP DISSOLVE 1 TABLET BY MOUTH DAILY AS NEEDED FOR MIGRAINES. TAKE AS CLOSE TO ONSET OF MIGRAINE AS POSSIBLE. ONE DAILY MAXIMUM. 16 tablet 11   rizatriptan (MAXALT-MLT) 10 MG disintegrating tablet Take 1 tablet (10 mg total) by mouth as needed for migraine. May repeat in 2 hours if needed 9 tablet 11   levocetirizine (XYZAL) 5 MG tablet TAKE 1 TABLET (5 MG TOTAL) BY MOUTH EVERY EVENING. 90 tablet 1   hyoscyamine (LEVSIN) 0.125 MG tablet Take 1-2 tablets (0.125-0.25 mg total) by mouth every 4 (four) hours as needed. 10 tablet 0   Semaglutide-Weight Management (WEGOVY) 1.7 MG/0.75ML SOAJ Inject 1.7 mg into the skin once a week. 3 mL 1   No facility-administered medications prior to visit.    Allergies  Allergen Reactions   Codeine Other (See Comments)    REACTION: upset stomach once, but has taken it other times and been ok   Lisinopril Other (See Comments)    cough   Sulfa Antibiotics Hives    ROS    See HPI Objective:    Physical Exam Constitutional:      General: She is not in acute distress.    Appearance: Normal appearance. She is well-developed.  HENT:     Head: Normocephalic and atraumatic.     Right Ear: External ear normal.      Left Ear: External ear normal.  Eyes:     General: No scleral icterus. Neck:     Thyroid: No thyromegaly.  Cardiovascular:     Rate and Rhythm: Normal rate and regular rhythm.     Heart sounds: Normal heart sounds. No murmur heard. Pulmonary:     Effort: Pulmonary effort is normal. No respiratory distress.     Breath sounds: Normal breath sounds. No wheezing.  Musculoskeletal:     Cervical back: Neck supple.  Skin:    General: Skin is warm and dry.  Neurological:     Mental Status: She is alert and oriented to person, place, and time.  Psychiatric:        Mood and Affect: Mood normal.        Behavior: Behavior normal.        Thought Content: Thought content normal.        Judgment: Judgment normal.      BP 126/76 (BP Location: Right Arm, Patient Position: Sitting, Cuff Size: Small)   Pulse 77   Temp 98.1 F (36.7 C) (Oral)   Resp 16   Wt 155 lb (70.3 kg)   SpO2 100%   BMI 26.61 kg/m  Wt Readings from Last 3 Encounters:  06/07/23 155 lb (70.3 kg)  04/29/23 148 lb 9.6 oz (67.4 kg)  03/08/23 146 lb (66.2 kg)       Assessment & Plan:   Problem List Items Addressed This Visit       Unprioritized   Vitamin D deficiency    Recommended vit D 3000 international units daily      Overweight  She is concerned about regaining the weight she lost after her insurance discontinued coverage of Wegovy. She has gained 7 pounds since she discontinued. She is interested in a trial of Contrave.  Will see if her insurance will cover Contrave.       Migraines    Reports stable- being treated with Botox by neurology.       Hypertension     BP Readings from Last 3 Encounters:  06/07/23 126/76  04/29/23 114/80  04/15/23 114/74   Stable on metoprolol and amlodipine.       GERD (gastroesophageal reflux disease)    Stable with rare use of Pepcid.       Anxiety and depression    Stable.       Anemia    Lab Results  Component Value Date   WBC 4.8 03/08/2023   HGB  12.5 03/08/2023   HCT 39.1 03/08/2023   MCV 78.4 03/08/2023   PLT 311.0 03/08/2023   Stable. Continue iron supplement.       Relevant Orders   Iron, TIBC and Ferritin Panel   CBC w/Diff   Allergic rhinitis    Stable on zyxal. Continue same.       ADHD, predominantly inattentive type - Primary    Stable on vyvanse.  Continue same.  Contract is up to date.        I have discontinued Nazanin H. Ebeling's hyoscyamine and Wegovy. I am also having her start on Contrave. Additionally, I am having her maintain her rizatriptan, famotidine, Iron, Vitamin D3, docusate sodium, fluticasone, metoprolol succinate, ondansetron, Nurtec, Botox, amLODipine, lisdexamfetamine, and levocetirizine.  Meds ordered this encounter  Medications   Naltrexone-buPROPion HCl ER (CONTRAVE) 8-90 MG TB12    Sig: Take 1 tablet by mouth 2 (two) times daily.    Dispense:  60 tablet    Refill:  1    Order Specific Question:   Supervising Provider    Answer:   Danise Edge A [[id number]]   levocetirizine (XYZAL) 5 MG tablet    Sig: TAKE 1 TABLET (5 MG TOTAL) BY MOUTH EVERY EVENING.    Dispense:  90 tablet    Refill:  4    Order Specific Question:   Supervising Provider    Answer:   Danise Edge A [[id number]]

## 2023-06-07 NOTE — Assessment & Plan Note (Signed)
She is concerned about regaining the weight she lost after her insurance discontinued coverage of Wegovy. She has gained 7 pounds since she discontinued. She is interested in a trial of Contrave.  Will see if her insurance will cover Contrave.

## 2023-06-07 NOTE — Assessment & Plan Note (Addendum)
Stable on vyvanse.  Continue same.  Contract is up to date.

## 2023-06-07 NOTE — Assessment & Plan Note (Addendum)
Lab Results  Component Value Date   WBC 4.8 03/08/2023   HGB 12.5 03/08/2023   HCT 39.1 03/08/2023   MCV 78.4 03/08/2023   PLT 311.0 03/08/2023   Stable. Continue iron supplement.

## 2023-06-08 ENCOUNTER — Other Ambulatory Visit (HOSPITAL_BASED_OUTPATIENT_CLINIC_OR_DEPARTMENT_OTHER): Payer: Self-pay

## 2023-06-08 ENCOUNTER — Telehealth: Payer: Self-pay | Admitting: Family

## 2023-06-08 DIAGNOSIS — D509 Iron deficiency anemia, unspecified: Secondary | ICD-10-CM

## 2023-06-08 NOTE — Telephone Encounter (Signed)
See mychart.  

## 2023-06-09 NOTE — Telephone Encounter (Signed)
PA approved.  Effective 06/07/2023 to 07/07/2023

## 2023-06-10 ENCOUNTER — Other Ambulatory Visit (HOSPITAL_BASED_OUTPATIENT_CLINIC_OR_DEPARTMENT_OTHER): Payer: Self-pay

## 2023-06-11 ENCOUNTER — Other Ambulatory Visit (HOSPITAL_COMMUNITY): Payer: Self-pay

## 2023-06-14 ENCOUNTER — Other Ambulatory Visit (HOSPITAL_COMMUNITY): Payer: Self-pay

## 2023-06-21 ENCOUNTER — Other Ambulatory Visit (HOSPITAL_BASED_OUTPATIENT_CLINIC_OR_DEPARTMENT_OTHER): Payer: Self-pay

## 2023-06-28 ENCOUNTER — Other Ambulatory Visit (HOSPITAL_COMMUNITY): Payer: Self-pay

## 2023-07-15 ENCOUNTER — Other Ambulatory Visit: Payer: Self-pay | Admitting: Family

## 2023-07-15 ENCOUNTER — Other Ambulatory Visit (HOSPITAL_BASED_OUTPATIENT_CLINIC_OR_DEPARTMENT_OTHER): Payer: Self-pay

## 2023-07-15 MED ORDER — LISDEXAMFETAMINE DIMESYLATE 50 MG PO CAPS
50.0000 mg | ORAL_CAPSULE | Freq: Every day | ORAL | 0 refills | Status: DC
  Filled 2023-07-15: qty 30, 30d supply, fill #0

## 2023-08-09 ENCOUNTER — Ambulatory Visit (INDEPENDENT_AMBULATORY_CARE_PROVIDER_SITE_OTHER): Payer: 59 | Admitting: Family

## 2023-08-09 ENCOUNTER — Other Ambulatory Visit (HOSPITAL_BASED_OUTPATIENT_CLINIC_OR_DEPARTMENT_OTHER): Payer: Self-pay

## 2023-08-09 VITALS — BP 106/65 | HR 66 | Temp 97.5°F | Resp 16

## 2023-08-09 DIAGNOSIS — I10 Essential (primary) hypertension: Secondary | ICD-10-CM

## 2023-08-09 DIAGNOSIS — S80811A Abrasion, right lower leg, initial encounter: Secondary | ICD-10-CM | POA: Diagnosis not present

## 2023-08-09 DIAGNOSIS — Z23 Encounter for immunization: Secondary | ICD-10-CM | POA: Diagnosis not present

## 2023-08-09 DIAGNOSIS — E663 Overweight: Secondary | ICD-10-CM | POA: Diagnosis not present

## 2023-08-09 DIAGNOSIS — T148XXA Other injury of unspecified body region, initial encounter: Secondary | ICD-10-CM | POA: Insufficient documentation

## 2023-08-09 DIAGNOSIS — S60511A Abrasion of right hand, initial encounter: Secondary | ICD-10-CM

## 2023-08-09 MED ORDER — METOPROLOL SUCCINATE ER 50 MG PO TB24: 25.0000 mg | ORAL_TABLET | Freq: Every day | ORAL | Status: DC

## 2023-08-09 MED ORDER — CONTRAVE 8-90 MG PO TB12
ORAL_TABLET | ORAL | 0 refills | Status: DC
  Filled 2023-08-09: qty 360, 90d supply, fill #0

## 2023-08-09 NOTE — Patient Instructions (Signed)
VISIT SUMMARY:  During your visit, we discussed your recent injury from a rusty barbed wire, your appetite control medication, and your blood pressure. We also touched on your general health maintenance.  YOUR PLAN:  -MINOR SKIN TRAUMA: You had minor skin scrapes from the barbed wire. We gave you a tetanus shot to prevent any potential infection. We also advised you to apply an antibiotic ointment on the scrapes. If you notice any redness around the scrapes, which could be a sign of infection, please call us for a prescription of Keflex, an antibiotic.  -WEIGHT MANAGEMENT: You are currently taking Contrave for appetite control. As you wish to increase the dose, we will gradually increase it to two tablets twice a day over the next two weeks. This should help you better manage your weight.  -HYPERTENSION: You are also taking Metoprolol for high blood pressure. Because of a potential interaction with the increased dose of Contrave, we will reduce the dose of Metoprolol by half. Please monitor your heart rate and blood pressure regularly.  -GENERAL HEALTH MAINTENANCE: For your general health, you will be getting a flu shot at work. We will also schedule a follow-up visit in three months to check on your progress.  INSTRUCTIONS:  Please remember to apply the antibiotic ointment on your scrapes, monitor your heart rate and blood pressure, and call us if you notice any redness around the scrapes. Also, remember to gradually increase your Contrave dose as advised, and get your flu shot at work. We look forward to seeing you in three months for your follow-up visit.

## 2023-08-09 NOTE — Assessment & Plan Note (Signed)
BP appears overtreated.   Patient on Metoprolo xr 50mg . Noted potential interaction with increased Contrave dose. -Reduce Metoprolol dose by half. -Patient to monitor heart rate and blood pressure.

## 2023-08-09 NOTE — Assessment & Plan Note (Signed)
  Patient sustained minor skin abrasions from barbed wire. No signs of infection. Last tetanus shot in May 2015. -Administer TD vaccine today. -Advise patient to apply topical antibiotic ointment. -If signs of infection develop (redness around the scrapes), patient to call for prescription of Keflex.

## 2023-08-09 NOTE — Assessment & Plan Note (Signed)
Patient on Contrave, currently taking one tablet once or twice a day. Patient reports it helps with appetite control and wishes to increase dose. -Increase Contrave to one tablet twice a day for a week, then two tablets in the morning and one in the evening for a week, and then two tablets twice a day.

## 2023-08-09 NOTE — Progress Notes (Signed)
Subjective:     Patient ID: Kaitlin Foster, female    DOB: 1981/03/06, 42 y.o.   MRN: [id number]  Chief Complaint  Patient presents with   Laceration    Patient reports cuts wit a metal wire on right hand and right foot    Laceration     Discussed the use of AI scribe software for clinical note transcription with the patient, who gave verbal consent to proceed.  History of Present Illness   The patient presents for a tetanus shot after an injury sustained from a rusty barbed wire. The injury occurred yesterday around 3pm. The patient describes two scrapes, one of which is on the leg. He was wearing boots at the time of the incident. The patient is not overly concerned about the injury, but wants to prevent tetanus.  In addition, the patient has been taking Contrave for appetite control. He has been taking one tablet once or twice a day, but would like to increase the dosage to two tablets twice a day. The patient reports that the medication has helped control his appetite and has prevented significant weight gain. He experienced some nausea during the first week of taking the medication, but this side effect has since subsided.  The patient also mentions a past brain surgery, for which he was on Keppra prophylactically for a month. He has no history of seizures.        Wt Readings from Last 3 Encounters:  06/07/23 155 lb (70.3 kg)  04/29/23 148 lb 9.6 oz (67.4 kg)  03/08/23 146 lb (66.2 kg)     Health Maintenance Due  Topic Date Due   INFLUENZA VACCINE  06/10/2023    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   Anxiety    Chronic migraine    neurologist-  dr Lucia Gaskins--- botox injecitons every 3 months   GERD (gastroesophageal reflux disease)    History of arteriovenous malformation (AVM) (06-16-2019-  per pt residual very very mild aphsia and slight facial droop)   06-05-2007  ED visit w/ severe headche for 3 days---  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;   2 stage embolization ACA supply to AVM @ [facility]  Sept 11th and 18th 2008 then craniotomy resection AVM 07-28-2007   Hypertension    Left ureteral stone    Migraines    Nephrolithiasis    left renal nonobstructive stone per CT 06-14-2019   Uterine fibroid     Past Surgical History:  Procedure Laterality Date   CEREBRAL EMBOLIZATION  Sept 11th and 18th, 2008   @[facility]    2 staged embolization left ACA supply to AVM   CESAREAN SECTION  07/06/2009   CRANIOTOMY  07-29-2007   @ [facility]   resection left frontal AVM    Family History  Problem Relation Age of Onset   Hypertension Father    Alcohol abuse Father    Arthritis Father    Obesity Father    Hypertension Mother    Arthritis Mother    Migraines Mother    Obesity Mother    Cancer Mother        sinus tumor   Migraines Sister    Ulcerative colitis Sister    Migraines Maternal Aunt    ADD / ADHD Son    Cancer Maternal Grandmother        colon diagnosed at age 62   Dementia Maternal Grandmother    Heart disease  Neg Hx     Social History   Socioeconomic History   Marital status: Married    Spouse name: Thad   Number of children: 1   Years of education: Not on file   Highest education level: Associate degree: academic program  Occupational History   Occupation: Charity fundraiser  Tobacco Use   Smoking status: Never   Smokeless tobacco: Never  Vaping Use   Vaping status: Never Used  Substance and Sexual Activity   Alcohol use: No    Alcohol/week: 0.0 standard drinks of alcohol   Drug use: No   Sexual activity: Yes    Birth control/protection: Condom  Other Topics Concern   Not on file  Social History Narrative   Regular exercise: no regular exercise   Caffeine use:  1-2 coffee daily   Son born 2010   Works as a Engineer, 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)   Married   Lives at home with her husband and child      Social Determinants of Health   Financial Resource Strain:  Low Risk  (06/06/2023)   Overall Financial Resource Strain (CARDIA)    Difficulty of Paying Living Expenses: Not hard at all  Food Insecurity: No Food Insecurity (06/06/2023)   Hunger Vital Sign    Worried About Running Out of Food in the Last Year: Never true    Ran Out of Food in the Last Year: Never true  Transportation Needs: No Transportation Needs (06/06/2023)   PRAPARE - Administrator, Civil Service (Medical): No    Lack of Transportation (Non-Medical): No  Physical Activity: Not on file  Stress: Not on file  Social Connections: Socially Integrated (06/06/2023)   Social Connection and Isolation Panel [NHANES]    Frequency of Communication with Friends and Family: More than three times a week    Frequency of Social Gatherings with Friends and Family: Twice a week    Attends Religious Services: More than 4 times per year    Active Member of [employer] or Organizations: Yes    Attends Engineer, structural: More than 4 times per year    Marital Status: Married  Catering manager Violence: Not on file    Outpatient Medications Prior to Visit  Medication Sig Dispense Refill   amLODipine (NORVASC) 5 MG tablet Take 1 tablet (5 mg total) by mouth daily. 90 tablet 1   botulinum toxin Type A (BOTOX) 200 units injection Provider to inject 155 units into the muscles of the head and neck every 12 weeks. Discard remainder. 1 each 3   Cholecalciferol (VITAMIN D3) 75 MCG (3000 UT) TABS Take 1 tablet by mouth daily. 30 tablet    docusate sodium (COLACE) 50 MG capsule Take 50 mg by mouth 2 (two) times daily.     famotidine (PEPCID) 20 MG tablet Take 20 mg by mouth daily as needed for heartburn or indigestion.      Ferrous Sulfate (IRON) 325 (65 Fe) MG TABS Take 1 tablet (325 mg total) by mouth daily. 30 tablet 0   fluticasone (FLONASE) 50 MCG/ACT nasal spray Place 2 sprays into both nostrils daily. 16 g 0   levocetirizine (XYZAL) 5 MG tablet Take 1 tablet (5 mg total) by mouth every  evening. 90 tablet 4   lisdexamfetamine (VYVANSE) 50 MG capsule Take 1 capsule (50 mg total) by mouth daily. 30 capsule 0   ondansetron (ZOFRAN-ODT) 4 MG disintegrating tablet Dissolve 1-2 tablets (4-8 mg total) by mouth every 8 (eight) hours  as needed. 30 tablet 3   Rimegepant Sulfate (NURTEC) 75 MG TBDP DISSOLVE 1 TABLET BY MOUTH DAILY AS NEEDED FOR MIGRAINES. TAKE AS CLOSE TO ONSET OF MIGRAINE AS POSSIBLE. ONE DAILY MAXIMUM. 16 tablet 11   rizatriptan (MAXALT-MLT) 10 MG disintegrating tablet Take 1 tablet (10 mg total) by mouth as needed for migraine. May repeat in 2 hours if needed 9 tablet 11   metoprolol succinate (TOPROL-XL) 50 MG 24 hr tablet Take 1 tablet (50 mg total) by mouth daily. Take with or immediately following a meal. 90 tablet 1   Naltrexone-buPROPion HCl ER (CONTRAVE) 8-90 MG TB12 Take 1 tablet by mouth 2 (two) times daily. 60 tablet 1   No facility-administered medications prior to visit.    Allergies  Allergen Reactions   Codeine Other (See Comments)    REACTION: upset stomach once, but has taken it other times and been ok   Lisinopril Other (See Comments)    cough   Sulfa Antibiotics Hives    ROS    See HPI Objective:    Physical Exam Constitutional:      Appearance: Normal appearance.  Skin:    Comments: Superficial abrasions noted right palm and right lower shin  Neurological:     Mental Status: She is alert and oriented to person, place, and time.  Psychiatric:        Mood and Affect: Mood normal.        Behavior: Behavior normal.        Thought Content: Thought content normal.        Judgment: Judgment normal.        BP 106/65 (BP Location: Right Arm, Patient Position: Sitting, Cuff Size: Small)   Pulse 66   Temp (!) 97.5 F (36.4 C) (Oral)   Resp 16   SpO2 100%  Wt Readings from Last 3 Encounters:  06/07/23 155 lb (70.3 kg)  04/29/23 148 lb 9.6 oz (67.4 kg)  03/08/23 146 lb (66.2 kg)       Assessment & Plan:   Problem List Items  Addressed This Visit       Unprioritized   Overweight    Patient on Contrave, currently taking one tablet once or twice a day. Patient reports it helps with appetite control and wishes to increase dose. -Increase Contrave to one tablet twice a day for a week, then two tablets in the morning and one in the evening for a week, and then two tablets twice a day.      Hypertension    BP appears overtreated.   Patient on Metoprolo xr 50mg . Noted potential interaction with increased Contrave dose. -Reduce Metoprolol dose by half. -Patient to monitor heart rate and blood pressure.      Relevant Medications   metoprolol succinate (TOPROL-XL) 50 MG 24 hr tablet   Abrasion - Primary     Patient sustained minor skin abrasions from barbed wire. No signs of infection. Last tetanus shot in May 2015. -Administer TD vaccine today. -Advise patient to apply topical antibiotic ointment. -If signs of infection develop (redness around the scrapes), patient to call for prescription of Keflex.       I have changed Laniya H. Sobecki's Contrave and metoprolol succinate. I am also having her maintain her rizatriptan, famotidine, Iron, Vitamin D3, docusate sodium, fluticasone, ondansetron, Nurtec, Botox, amLODipine, levocetirizine, and lisdexamfetamine.  Meds ordered this encounter  Medications   Naltrexone-buPROPion HCl ER (CONTRAVE) 8-90 MG TB12    Sig: 1 tablet twice daily for  1 week; then 2 tablets in the morning and 1 tablet in the evening for 1 week; and then 2 tablets twice daily    Dispense:  360 tablet    Refill:  0    Order Specific Question:   Supervising Provider    Answer:   Danise Edge A [[id number]]   metoprolol succinate (TOPROL-XL) 50 MG 24 hr tablet    Sig: Take 0.5 tablets (25 mg total) by mouth daily. Take with or immediately following a meal.    Order Specific Question:   Supervising Provider    Answer:   Danise Edge A [[id number]]

## 2023-08-10 ENCOUNTER — Other Ambulatory Visit (HOSPITAL_BASED_OUTPATIENT_CLINIC_OR_DEPARTMENT_OTHER): Payer: Self-pay

## 2023-08-12 ENCOUNTER — Other Ambulatory Visit (INDEPENDENT_AMBULATORY_CARE_PROVIDER_SITE_OTHER): Payer: 59

## 2023-08-12 ENCOUNTER — Other Ambulatory Visit: Payer: 59

## 2023-08-12 ENCOUNTER — Other Ambulatory Visit (HOSPITAL_BASED_OUTPATIENT_CLINIC_OR_DEPARTMENT_OTHER): Payer: Self-pay

## 2023-08-12 DIAGNOSIS — D509 Iron deficiency anemia, unspecified: Secondary | ICD-10-CM | POA: Diagnosis not present

## 2023-08-12 NOTE — Progress Notes (Signed)
Specimen drop off

## 2023-08-13 ENCOUNTER — Telehealth: Payer: Self-pay

## 2023-08-13 DIAGNOSIS — R195 Other fecal abnormalities: Secondary | ICD-10-CM | POA: Insufficient documentation

## 2023-08-13 LAB — FECAL OCCULT BLOOD, IMMUNOCHEMICAL: Fecal Occult Bld: POSITIVE — AB

## 2023-08-13 NOTE — Addendum Note (Signed)
Addended by: Sandford Craze on: 08/13/2023 09:44 AM   Modules accepted: Orders

## 2023-08-13 NOTE — Telephone Encounter (Signed)
CRITICAL VALUE STICKER  CRITICAL VALUE: Positive Ifob  RECEIVER (on-site recipient of call): Wellington Hampshire, RMA  DATE & TIME NOTIFIED:  08/13/23, 9:13 AM  MESSENGER (representative from lab): Leah  MD NOTIFIED: PCP: Sandford Craze  TIME OF NOTIFICATION: 08/13/23, 9:13 AM  RESPONSE:  Pending

## 2023-08-13 NOTE — Telephone Encounter (Signed)
Patient notified of results and referral 

## 2023-08-13 NOTE — Telephone Encounter (Signed)
Please contact pt and let her know that I reviewed her IFOB results and she is positive for hidden blood. Given this finding and her anemia, I would like to refer her to GI for further evaluation.

## 2023-08-16 ENCOUNTER — Encounter: Payer: Self-pay | Admitting: Gastroenterology

## 2023-08-17 ENCOUNTER — Other Ambulatory Visit: Payer: Self-pay

## 2023-08-17 NOTE — Progress Notes (Signed)
Specialty Pharmacy Refill Coordination Note  Kaitlin Foster is a 42 y.o. female contacted today regarding refills of specialty medication(s) Onabotulinumtoxina   Patient requested Courier to Provider Office   Delivery date: 08/23/23   Verified address: [facility], [address], [place], [address]   Medication will be filled on 08/20/2023.

## 2023-08-19 ENCOUNTER — Ambulatory Visit: Payer: 59 | Admitting: Nurse Practitioner

## 2023-08-19 ENCOUNTER — Other Ambulatory Visit: Payer: 59

## 2023-08-19 ENCOUNTER — Encounter: Payer: Self-pay | Admitting: Nurse Practitioner

## 2023-08-19 VITALS — BP 124/70 | HR 79 | Ht 64.0 in | Wt 161.0 lb

## 2023-08-19 DIAGNOSIS — R110 Nausea: Secondary | ICD-10-CM

## 2023-08-19 DIAGNOSIS — R195 Other fecal abnormalities: Secondary | ICD-10-CM

## 2023-08-19 DIAGNOSIS — D509 Iron deficiency anemia, unspecified: Secondary | ICD-10-CM

## 2023-08-19 NOTE — Progress Notes (Signed)
I agree with the assessment and plan as outlined by Ms. Guenther. 

## 2023-08-19 NOTE — Progress Notes (Signed)
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  Patient's husband is known to Dr. Leonides Schanz and she is requesting Dr. Leonides Schanz be her gastroenterologist as well . She has a history of ADHD, migraines, menorrhagia, uterine fibroids, iron deficiency anemia , GERD and frontal brain AVM status postresection in 2008.   Positive IFOBT / occasional rectal bleeding with BMs.  Bleeding likely hemorrhoidal but need to rule out colon polyps / neoplasm.   Iron deficiency anemia ( recurrent per patient) .  Most likely 2/2 to menorrhagia related to uterine fibroids and also recent discontinuation of chronic oral iron   Intermittent nausea.  Patient feels nausea is multifactorial (oral iron, migraines, and ? Due to Contrave). Takes Zofran as needed  History of acid reflux, resolved with weight loss.  No longer requiring GERD medication  FMH of ulcerative colitis in sister  See PMH for any additional medical history  PLAN   -- Iron deficiency anemia most certainly secondary to menorrhagia but will proceed with upper and lower endoscopic evaluation given positive IFOBT, occasional rectal bleeding, nausea, and FMH of ulcerative colitis.   -- She resumed oral iron in July. Will hold prior to colonoscopy  -- Celiac serologies   HPI   Chief complaint : positive IFOBT  Quanna gives a prior history of iron deficiency anemia related.  She has heavy menstrual bleeding secondary to uterine fibroids.  She has been on iron for years but recently discontinued it for a couple of months.  Her hemoglobin subsequently fell into the 10 range.  Iron studies compatible with iron deficiency.  The iron causes her to be nauseated sometimes.  She also thinks that nausea is coming from [facility] which she is taking for weight loss. Takes Zofran as needed.  No regular use of NSAIDs.  She occasionally has painless rectal bleeding with bowel movements.  No abdominal pain.  Her sister  has ulcerative colitis.  Her grandmother had some kind of a colon mass found when she was in her 38s but apparently workup was not pursued.    GI History / Studies   none  Labs      Latest Ref Rng & Units 06/07/2023   11:48 AM 03/08/2023   10:27 AM 08/26/2022    9:22 AM  CBC  WBC 4.0 - 10.5 K/uL 5.8  4.8  5.5   Hemoglobin 12.0 - 15.0 g/dL 16.1  09.6  04.5   Hematocrit 36.0 - 46.0 % 34.7  39.1  41.2   Platelets 150.0 - 400.0 K/uL 321.0  311.0  287.0     Lab Results  Component Value Date   LIPASE 32 06/14/2019      Latest Ref Rng & Units 03/08/2023   10:27 AM 08/26/2022    9:22 AM 08/29/2021    9:05 AM  CMP  Glucose 70 - 99 mg/dL 82  96  96   BUN 6 - 23 mg/dL 13  15  16    Creatinine 0.40 - 1.20 mg/dL 4.09  8.11  9.14   Sodium 135 - 145 mEq/L 139  139  140   Potassium 3.5 - 5.1 mEq/L 4.1  4.0  4.2   Chloride 96 - 112 mEq/L 105  105  104   CO2 19 - 32 mEq/L 26  28  29    Calcium 8.4 - 10.5 mg/dL 9.2  9.2  9.4   Total Protein 6.0 - 8.3 g/dL 6.4  6.6   Total Bilirubin 0.2 - 1.2 mg/dL 0.3   0.6   Alkaline Phos 39 - 117 U/L 55   80   AST 0 - 37 U/L 16   19   ALT 0 - 35 U/L 9   14    FERRITIN is 3  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;   2 stage embolization ACA supply to AVM @ [facility]  Sept 11th and 18th 2008 then craniotomy resection AVM 07-28-2007   Hypertension    Left ureteral stone    Migraines    Nephrolithiasis    left renal nonobstructive stone per CT 06-14-2019   Uterine fibroid    Past Surgical History:  Procedure Laterality Date    CEREBRAL EMBOLIZATION  Sept 11th and 18th, 2008   @[facility]    2 staged embolization left ACA supply to AVM   CESAREAN SECTION  07/06/2009   CRANIOTOMY  07-29-2007   @ [facility]   resection left frontal AVM   Family History  Problem Relation Age of Onset   Hypertension Father    Alcohol abuse Father    Arthritis Father    Obesity Father    Hypertension Mother    Arthritis Mother    Migraines Mother    Obesity Mother    Cancer Mother        sinus tumor   Migraines Sister    Ulcerative colitis Sister    Migraines Maternal Aunt    ADD / ADHD Son    Cancer Maternal Grandmother        colon diagnosed at age 41   Dementia Maternal Grandmother    Heart disease Neg Hx    Social History   Tobacco Use   Smoking status: Never   Smokeless tobacco: Never  Vaping Use   Vaping status: Never Used  Substance Use Topics   Alcohol use: No    Alcohol/week: 0.0 standard drinks of alcohol   Drug use: No   Current Outpatient Medications  Medication Sig Dispense Refill   amLODipine (NORVASC) 5 MG tablet Take 1 tablet (5 mg total) by mouth daily. 90 tablet 1   botulinum toxin Type A (BOTOX) 200 units injection Provider to inject 155 units into the muscles of the head and neck every 12 weeks. Discard remainder. 1 each 3   Cholecalciferol (VITAMIN D3) 75 MCG (3000 UT) TABS Take 1 tablet by mouth daily. 30 tablet    docusate sodium (COLACE) 50 MG capsule Take 50 mg by mouth 2 (two) times daily.     Ferrous Sulfate (IRON) 325 (65 Fe) MG TABS Take 1 tablet (325 mg total) by mouth daily. 30 tablet 0   fluticasone (FLONASE) 50 MCG/ACT nasal spray Place 2 sprays into both nostrils daily. 16 g 0   levocetirizine (XYZAL) 5 MG tablet Take 1 tablet (5 mg total) by mouth every evening. 90 tablet 4   lisdexamfetamine (VYVANSE) 50 MG capsule Take 1 capsule (50 mg total) by mouth daily. 30 capsule 0   metoprolol succinate (TOPROL-XL) 50 MG 24 hr tablet  Take 0.5 tablets (25 mg total) by mouth daily. Take with or  immediately following a meal.     Naltrexone-buPROPion HCl ER (CONTRAVE) 8-90 MG TB12 Take 1 tablet twice daily for 1 week; then 2 tablets in the morning and 1 tablet in the evening for 1 week; and then 2 tablets twice daily. 360 tablet 0   ondansetron (ZOFRAN-ODT) 4 MG disintegrating tablet Dissolve 1-2 tablets (4-8 mg total) by mouth every 8 (eight) hours as needed. 30 tablet 3   Rimegepant Sulfate (NURTEC) 75 MG TBDP DISSOLVE 1 TABLET BY MOUTH DAILY AS NEEDED FOR MIGRAINES. TAKE AS CLOSE TO ONSET OF MIGRAINE AS POSSIBLE. ONE DAILY MAXIMUM. 16 tablet 11   rizatriptan (MAXALT-MLT) 10 MG disintegrating tablet Take 1 tablet (10 mg total) by mouth as needed for migraine. May repeat in 2 hours if needed 9 tablet 11   No current facility-administered medications for this visit.   Allergies  Allergen Reactions   Codeine Other (See Comments)    REACTION: upset stomach once, but has taken it other times and been ok   Lisinopril Other (See Comments)    cough   Sulfa Antibiotics Hives    Review of Systems: Positive for back pain, headaches, sleeping problems.  All other systems reviewed and negative except where noted in HPI.   Wt Readings from Last 3 Encounters:  06/07/23 155 lb (70.3 kg)  04/29/23 148 lb 9.6 oz (67.4 kg)  03/08/23 146 lb (66.2 kg)    Physical Exam:  BP 124/70   Pulse 79   Ht 5\' 4"  (1.626 m)   Wt 161 lb (73 kg)   BMI 27.64 kg/m  Constitutional:  Pleasant, generally well appearing female in no acute distress. Psychiatric:  Normal mood and affect. Behavior is normal. EENT: Pupils normal.  Conjunctivae are normal. No scleral icterus. Neck supple.  Cardiovascular: Normal rate, regular rhythm.  Pulmonary/chest: Effort normal and breath sounds normal. No wheezing, rales or rhonchi. Abdominal: Soft, nondistended, nontender. Bowel sounds active throughout. There are no masses palpable. No hepatomegaly. Neurological: Alert and oriented to person place and time.  Willette Cluster, NP  08/19/2023, 1:40 PM  Cc:  Referring Provider Sandford Craze, NP

## 2023-08-19 NOTE — Patient Instructions (Addendum)
_______________________________________________________  If your blood pressure at your visit was 140/90 or greater, please contact your primary care physician to follow up on this.  _______________________________________________________  If you are age 41 or older, your body mass index should be between 23-30. Your Body mass index is 27.64 kg/m. If this is out of the aforementioned range listed, please consider follow up with your Primary Care Provider.  If you are age 72 or younger, your body mass index should be between 19-25. Your Body mass index is 27.64 kg/m. If this is out of the aformentioned range listed, please consider follow up with your Primary Care Provider.   ________________________________________________________  The [facility] providers would like to encourage you to use [facility] to communicate with providers for non-urgent requests or questions.  Due to long hold times on the telephone, sending your provider a message by [facility] may be a faster and more efficient way to get a response.  Please allow 48 business hours for a response.  Please remember that this is for non-urgent requests.  _______________________________________________________  Your provider has requested that you go to the basement level for lab work before leaving today. Press "B" on the elevator. The lab is located at the first door on the left as you exit the elevator.  Hold iron 1 week prior to the procedure 09-01-2023  You have been scheduled for an endoscopy and colonoscopy. Please follow the written instructions given to you at your visit today.  Please pick up your prep supplies at the pharmacy within the next 1-3 days.  If you use inhalers (even only as needed), please bring them with you on the day of your procedure.  DO NOT TAKE 7 DAYS PRIOR TO TEST- Trulicity (dulaglutide) Ozempic, Wegovy (semaglutide) Mounjaro (tirzepatide) Bydureon Bcise (exanatide extended release)  DO NOT TAKE 1  DAY PRIOR TO YOUR TEST Rybelsus (semaglutide) Adlyxin (lixisenatide) Victoza (liraglutide) Byetta (exanatide) ___________________________________________________________________________  It was a pleasure to see you today!  Thank you for trusting me with your gastrointestinal care!

## 2023-08-20 LAB — IGA: Immunoglobulin A: 180 mg/dL (ref 47–310)

## 2023-08-20 LAB — TISSUE TRANSGLUTAMINASE, IGA: (tTG) Ab, IgA: <1 U/mL

## 2023-08-25 ENCOUNTER — Ambulatory Visit: Payer: 59 | Admitting: Neurology

## 2023-08-25 DIAGNOSIS — G43711 Chronic migraine without aura, intractable, with status migrainosus: Secondary | ICD-10-CM

## 2023-08-25 MED ORDER — ONABOTULINUMTOXINA 200 UNITS IJ SOLR
155.0000 [IU] | Freq: Once | INTRAMUSCULAR | Status: AC
Start: 2023-08-25 — End: 2023-08-25
  Administered 2023-08-25: 155 [IU] via INTRAMUSCULAR

## 2023-08-25 NOTE — Progress Notes (Signed)
Botox- 200 units x 1 vial Lot: [id number] Expiration: 12/2025 NDC: 3149-7026-37  Bacteriostatic 0.9% Sodium Chloride- 4 mL  Lot: [id number] Expiration: 02/08/2024 NDC: 2774-1287-86  Dx: V67.209 S/P  Witnessed by Alveria Apley CMA

## 2023-08-25 NOTE — Progress Notes (Signed)
Consent Form Botulism Toxin Injection For Chronic Migraine  08/24/2022: stable 06/02/2023: acutely nurtec helps. Zofran roo. aseline daily headaches and16 migraine days.Now only max 4-5 migraine days a month and > 50% decrease severity and increased treatability 12/14/2022: stable, doin ggreat 09/21/2022: 2 migraines a month, takes nurtec and ondansetron, doing great, gave a zavzpret sample did not like   06/22/2022: stable 03/19/2022: stable, doing great 12/25/2021: stable  05/27/2021: Stable  Interval history 09/11/2021: Baseline daily headaches and16 migraine days. Since last botox she has had only 2 total very mild migraines a month and acute management works, nurtec works with zofran, she had side effects to zembrace, also maxalt works, takes with zofran. More than 25 free headache days a month, exceptional improvement!.  She is very happy. She is on adderall now but not causing headache.   Orders: [facility] or [place] for dry needling for cervical and lumbar/lumbosacral dry needling for myofascial disease in the past       Reviewed orally with patient, additionally signature is on file:  Botulism toxin has been approved by the Federal drug administration for treatment of chronic migraine. Botulism toxin does not cure chronic migraine and it may not be effective in some patients.  The administration of botulism toxin is accomplished by injecting a small amount of toxin into the muscles of the neck and head. Dosage must be titrated for each individual. Any benefits resulting from botulism toxin tend to wear off after 3 months with a repeat injection required if benefit is to be maintained. Injections are usually done every 3-4 months with maximum effect peak achieved by about 2 or 3 weeks. Botulism toxin is expensive and you should be sure of what costs you will incur resulting from the injection.  The side effects of botulism toxin use for chronic migraine may  include:   -Transient, and usually mild, facial weakness with facial injections  -Transient, and usually mild, head or neck weakness with head/neck injections  -Reduction or loss of forehead facial animation due to forehead muscle weakness  -Eyelid drooping  -Dry eye  -Pain at the site of injection or bruising at the site of injection  -Double vision  -Potential unknown long term risks  Contraindications: You should not have Botox if you are pregnant, nursing, allergic to albumin, have an infection, skin condition, or muscle weakness at the site of the injection, or have myasthenia gravis, Lambert-Eaton syndrome, or ALS.  It is also possible that as with any injection, there may be an allergic reaction or no effect from the medication. Reduced effectiveness after repeated injections is sometimes seen and rarely infection at the injection site may occur. All care will be taken to prevent these side effects. If therapy is given over a long time, atrophy and wasting in the muscle injected may occur. Occasionally the patient's become refractory to treatment because they develop antibodies to the toxin. In this event, therapy needs to be modified.  I have read the above information and consent to the administration of botulism toxin.    BOTOX PROCEDURE NOTE FOR MIGRAINE HEADACHE    Contraindications and precautions discussed with patient(above). Aseptic procedure was observed and patient tolerated procedure. Procedure performed by Dr. Artemio Aly  The condition has existed for more than 6 months, and pt does not have a diagnosis of ALS, Myasthenia Gravis or Lambert-Eaton Syndrome.  Risks and benefits of injections discussed and pt agrees to proceed with the procedure.  Written consent obtained  These injections  are medically necessary. Pt  receives good benefits from these injections. These injections do not cause sedations or hallucinations which the oral therapies may cause.  Description of  procedure:  The patient was placed in a sitting position. The standard protocol was used for Botox as follows, with 5 units of Botox injected at each site:   -Procerus muscle, midline injection  -Corrugator muscle, bilateral injection  -Frontalis muscle, bilateral injection, with 2 sites each side, medial injection was performed in the upper one third of the frontalis muscle, in the region vertical from the medial inferior edge of the superior orbital rim. The lateral injection was again in the upper one third of the forehead vertically above the lateral limbus of the cornea, 1.5 cm lateral to the medial injection site.  -Temporalis muscle injection, 5 sites, bilaterally. The first injection was 3 cm above the tragus of the ear, second injection site was 1.5 cm to 3 cm up from the first injection site in line with the tragus of the ear. The third injection site was 1.5-3 cm forward between the first 2 injection sites. The fourth injection site was 1.5 cm posterior to the second injection site. 5th site laterally in the temporalis  muscleat the level of the outer canthus.  - Patient feels her clenching is a trigger for headaches. +5 units masseter bilaterally   - Patient feels the migraines are centered around the eyes +5 units bilaterally at the outer canthus in the orbicularis occuli  -Occipitalis muscle injection, 3 sites, bilaterally. The first injection was done one half way between the occipital protuberance and the tip of the mastoid process behind the ear. The second injection site was done lateral and superior to the first, 1 fingerbreadth from the first injection. The third injection site was 1 fingerbreadth superiorly and medially from the first injection site.  -Cervical paraspinal muscle injection, 2 sites, bilateral knee first injection site was 1 cm from the midline of the cervical spine, 3 cm inferior to the lower border of the occipital protuberance. The second injection site was  1.5 cm superiorly and laterally to the first injection site.  -Trapezius muscle injection was performed at 3 sites, bilaterally. The first injection site was in the upper trapezius muscle halfway between the inflection point of the neck, and the acromion. The second injection site was one half way between the acromion and the first injection site. The third injection was done between the first injection site and the inflection point of the neck.   Will return for repeat injection in 3 months.   A 200 unit sof Botox was used, 45 U Botox not injected was wasted. The patient tolerated the procedure well, there were no complications of the above procedure.

## 2023-08-31 ENCOUNTER — Other Ambulatory Visit (HOSPITAL_BASED_OUTPATIENT_CLINIC_OR_DEPARTMENT_OTHER): Payer: Self-pay

## 2023-08-31 ENCOUNTER — Other Ambulatory Visit: Payer: Self-pay | Admitting: Family

## 2023-08-31 ENCOUNTER — Other Ambulatory Visit: Payer: Self-pay

## 2023-08-31 MED ORDER — LISDEXAMFETAMINE DIMESYLATE 50 MG PO CAPS
50.0000 mg | ORAL_CAPSULE | Freq: Every day | ORAL | 0 refills | Status: DC
  Filled 2023-08-31: qty 30, 30d supply, fill #0

## 2023-08-31 MED ORDER — METOPROLOL SUCCINATE ER 50 MG PO TB24
25.0000 mg | ORAL_TABLET | Freq: Every day | ORAL | 1 refills | Status: DC
  Filled 2023-08-31: qty 45, 90d supply, fill #0
  Filled 2023-10-25: qty 45, 90d supply, fill #1

## 2023-08-31 NOTE — Telephone Encounter (Signed)
Requesting: Vyvanse 50mg   Contract:03/08/23 UDS: 03/08/23 Last Visit: 08/09/23 Next Visit: 10/27/23 Last Refill: 07/15/23 #30 and 0RF   Please Advise

## 2023-09-08 ENCOUNTER — Encounter: Payer: Self-pay | Admitting: Internal Medicine

## 2023-09-08 ENCOUNTER — Other Ambulatory Visit (HOSPITAL_BASED_OUTPATIENT_CLINIC_OR_DEPARTMENT_OTHER): Payer: Self-pay

## 2023-09-08 ENCOUNTER — Ambulatory Visit: Payer: 59 | Admitting: Internal Medicine

## 2023-09-08 VITALS — BP 114/78 | HR 77 | Temp 97.7°F | Resp 17 | Ht 64.0 in | Wt 161.0 lb

## 2023-09-08 DIAGNOSIS — R195 Other fecal abnormalities: Secondary | ICD-10-CM

## 2023-09-08 DIAGNOSIS — F419 Anxiety disorder, unspecified: Secondary | ICD-10-CM | POA: Diagnosis not present

## 2023-09-08 DIAGNOSIS — K2281 Esophageal polyp: Secondary | ICD-10-CM

## 2023-09-08 DIAGNOSIS — K229 Disease of esophagus, unspecified: Secondary | ICD-10-CM | POA: Diagnosis not present

## 2023-09-08 DIAGNOSIS — K633 Ulcer of intestine: Secondary | ICD-10-CM

## 2023-09-08 DIAGNOSIS — I10 Essential (primary) hypertension: Secondary | ICD-10-CM | POA: Diagnosis not present

## 2023-09-08 DIAGNOSIS — D509 Iron deficiency anemia, unspecified: Secondary | ICD-10-CM | POA: Diagnosis not present

## 2023-09-08 DIAGNOSIS — K259 Gastric ulcer, unspecified as acute or chronic, without hemorrhage or perforation: Secondary | ICD-10-CM

## 2023-09-08 DIAGNOSIS — K5190 Ulcerative colitis, unspecified, without complications: Secondary | ICD-10-CM | POA: Diagnosis not present

## 2023-09-08 DIAGNOSIS — K2289 Other specified disease of esophagus: Secondary | ICD-10-CM | POA: Diagnosis not present

## 2023-09-08 MED ORDER — SODIUM CHLORIDE 0.9 % IV SOLN
500.0000 mL | Freq: Once | INTRAVENOUS | Status: DC
Start: 2023-09-08 — End: 2023-09-08

## 2023-09-08 MED ORDER — OMEPRAZOLE 40 MG PO CPDR
40.0000 mg | DELAYED_RELEASE_CAPSULE | Freq: Every day | ORAL | 1 refills | Status: DC
Start: 2023-09-08 — End: 2024-04-07
  Filled 2023-09-08: qty 30, 30d supply, fill #0
  Filled 2023-10-25: qty 30, 30d supply, fill #1

## 2023-09-08 NOTE — Op Note (Addendum)
[facility] Patient Name: Kaitlin Foster Procedure Date: 09/08/2023 3:22 PM MRN: [id number] Endoscopist: Madelyn Brunner [place] , , [id number] Age: 42 Referring MD:  Date of Birth: 01-26-1981 Gender: Female Account #: [id number] Procedure:                Colonoscopy Indications:              Iron deficiency anemia Medicines:                Monitored Anesthesia Care Procedure:                Pre-Anesthesia Assessment:                           - Prior to the procedure, a History and Physical                            was performed, and patient medications and                            allergies were reviewed. The patient's tolerance of                            previous anesthesia was also reviewed. The risks                            and benefits of the procedure and the sedation                            options and risks were discussed with the patient.                            All questions were answered, and informed consent                            was obtained. Prior Anticoagulants: The patient has                            taken no anticoagulant or antiplatelet agents. ASA                            Grade Assessment: II - A patient with mild systemic                            disease. After reviewing the risks and benefits,                            the patient was deemed in satisfactory condition to                            undergo the procedure.                           After obtaining informed consent, the colonoscope  was passed under direct vision. Throughout the                            procedure, the patient's blood pressure, pulse, and                            oxygen saturations were monitored continuously. The                            CF [id number] #[id number] was introduced through the anus                            and advanced to the the terminal ileum. The                            colonoscopy was performed without  difficulty. The                            patient tolerated the procedure well. The quality                            of the bowel preparation was good. The terminal                            ileum, ileocecal valve, appendiceal orifice, and                            rectum were photographed. Scope In: 3:51:11 PM Scope Out: 4:06:51 PM Scope Withdrawal Time: 0 hours 11 minutes 16 seconds  Total Procedure Duration: 0 hours 15 minutes 40 seconds  Findings:                 The terminal ileum contained a few localized                            non-bleeding erosions. No stigmata of recent                            bleeding were seen. Biopsies were taken with a cold                            forceps for histology.                           A localized area of mildly erythematous mucosa was                            found in the rectum. This was biopsied with a cold                            forceps for histology.                           Non-bleeding internal hemorrhoids were found during  retroflexion. The hemorrhoids were small. Complications:            No immediate complications. Estimated Blood Loss:     Estimated blood loss was minimal. Impression:               - A few erosions in the terminal ileum. Biopsied.                           - Erythematous mucosa in the rectum. Biopsied.                           - Non-bleeding internal hemorrhoids. Recommendation:           - Discharge patient to home (with escort).                           - Await pathology results.                           - The findings and recommendations were discussed                            with the patient. Dr Particia Lather [address]" [place],  09/08/2023 4:24:27 PM

## 2023-09-08 NOTE — Op Note (Addendum)
[facility] Patient Name: Kaitlin Foster Procedure Date: 09/08/2023 3:50 PM MRN: [id number] Endoscopist: Madelyn Brunner [place] , , [phone] Age: 42 Referring MD:  Date of Birth: [birth year] Gender: Female Account #: [id number] Procedure:                Upper GI endoscopy Indications:              Iron deficiency anemia Medicines:                Monitored Anesthesia Care Procedure:                Pre-Anesthesia Assessment:                           - Prior to the procedure, a History and Physical                            was performed, and patient medications and                            allergies were reviewed. The patient's tolerance of                            previous anesthesia was also reviewed. The risks                            and benefits of the procedure and the sedation                            options and risks were discussed with the patient.                            All questions were answered, and informed consent                            was obtained. Prior Anticoagulants: The patient has                            taken no anticoagulant or antiplatelet agents. ASA                            Grade Assessment: II - A patient with mild systemic                            disease. After reviewing the risks and benefits,                            the patient was deemed in satisfactory condition to                            undergo the procedure.                           After obtaining informed consent, the endoscope was  passed under direct vision. Throughout the                            procedure, the patient's blood pressure, pulse, and                            oxygen saturations were monitored continuously.The                            upper GI endoscopy was accomplished without                            difficulty. The patient tolerated the procedure                            well. The Olympus scope [id number]  was introduced                            through the mouth, and advanced to the second part                            of duodenum. Findings:                 A single small nodule with a localized distribution                            was found at the gastroesophageal junction.                            Biopsies were taken with a cold forceps for                            histology.                           A 2 cm hiatal hernia was present.                           A few localized small erosions with stigmata of                            recent bleeding were found in the gastric body.                            Biopsies were taken with a cold forceps for                            histology.                           The examined duodenum was normal. Biopsies were                            taken with a cold forceps for histology. Complications:  No immediate complications. Estimated Blood Loss:     Estimated blood loss was minimal. Impression:               - Nodule found in the esophagus. Biopsied.                           - 2 cm hiatal hernia.                           - Erosive gastropathy with stigmata of recent                            bleeding. Biopsied.                           - Normal examined duodenum. Biopsied. Recommendation:           - Await pathology results.                           - Omeprazole 40 mg QD for 8 weeks.                           - Avoid NSAIDs if possible.                           - Continue oral iron supplements.                           - Return to GI clinic in 3 months for IDA.                           - Perform a colonoscopy today. Dr Particia Lather "[name]" Leon[place]chan[place],  09/08/2023 4:19:53 PM

## 2023-09-08 NOTE — Progress Notes (Signed)
Called to room to assist during endoscopic procedure.  Patient ID and intended procedure confirmed with present staff. Received instructions for my participation in the procedure from the performing physician.  

## 2023-09-08 NOTE — Patient Instructions (Signed)
Discharge instructions given. Biopsies taken. Prescription sent to pharmacy. Office visit scheduled. Avoid NSAIDS. Continue oral iron supplements. YOU HAD AN ENDOSCOPIC PROCEDURE TODAY AT THE [facility]:   Refer to the procedure report that was given to you for any specific questions about what was found during the examination.  If the procedure report does not answer your questions, please call your gastroenterologist to clarify.  If you requested that your care partner not be given the details of your procedure findings, then the procedure report has been included in a sealed envelope for you to review at your convenience later.  YOU SHOULD EXPECT: Some feelings of bloating in the abdomen. Passage of more gas than usual.  Walking can help get rid of the air that was put into your GI tract during the procedure and reduce the bloating. If you had a lower endoscopy (such as a colonoscopy or flexible sigmoidoscopy) you may notice spotting of blood in your stool or on the toilet paper. If you underwent a bowel prep for your procedure, you may not have a normal bowel movement for a few days.  Please Note:  You might notice some irritation and congestion in your nose or some drainage.  This is from the oxygen used during your procedure.  There is no need for concern and it should clear up in a day or so.  SYMPTOMS TO REPORT IMMEDIATELY:  Following lower endoscopy (colonoscopy or flexible sigmoidoscopy):  Excessive amounts of blood in the stool  Significant tenderness or worsening of abdominal pains  Swelling of the abdomen that is new, acute  Fever of 100F or higher  Following upper endoscopy (EGD)  Vomiting of blood or coffee ground material  New chest pain or pain under the shoulder blades  Painful or persistently difficult swallowing  New shortness of breath  Fever of 100F or higher  Black, tarry-looking stools  For urgent or emergent issues, a gastroenterologist can be  reached at any hour by calling [phone]. Do not use MyChart messaging for urgent concerns.    DIET:  We do recommend a small meal at first, but then you may proceed to your regular diet.  Drink plenty of fluids but you should avoid alcoholic beverages for 24 hours.  ACTIVITY:  You should plan to take it easy for the rest of today and you should NOT DRIVE or use heavy machinery until tomorrow (because of the sedation medicines used during the test).    FOLLOW UP: Our staff will call the number listed on your records the next business day following your procedure.  We will call around 7:15- 8:00 am to check on you and address any questions or concerns that you may have regarding the information given to you following your procedure. If we do not reach you, we will leave a message.     If any biopsies were taken you will be contacted by phone or by letter within the next 1-3 weeks.  Please call us at [phone] if you have not heard about the biopsies in 3 weeks.    SIGNATURES/CONFIDENTIALITY: You and/or your care partner have signed paperwork which will be entered into your electronic medical record.  These signatures attest to the fact that that the information above on your After Visit Summary has been reviewed and is understood.  Full responsibility of the confidentiality of this discharge information lies with you and/or your care-partner.

## 2023-09-08 NOTE — Progress Notes (Signed)
GASTROENTEROLOGY PROCEDURE H&P NOTE   Primary Care Physician: Sandford Craze, NP    Reason for Procedure:   Positive FOBT, IDA, nausea  Plan:    EGD/colonoscopy  Patient is appropriate for endoscopic procedure(s) in the ambulatory ([facility]) setting.  The nature of the procedure, as well as the risks, benefits, and alternatives were carefully and thoroughly reviewed with the patient. Ample time for discussion and questions allowed. The patient understood, was satisfied, and agreed to proceed.     HPI: Kaitlin Foster is a 42 y.o. female who presents for EGD/colonoscopy for evaluation of positive FOBT, IDA, nausea .  Patient was most recently seen in the Gastroenterology Clinic on 08/19/23.  No interval change in medical history since that appointment. Patient does use ibuprofen on occasion. Please refer to that note for full details regarding GI history and clinical presentation.   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 Authorizing Provider  amLODipine (NORVASC) 5 MG tablet Take 1 tablet (5 mg total) by mouth daily. 05/22/23 05/21/24 Yes Sandford Craze, NP  Cholecalciferol (VITAMIN D3) 75 MCG (3000 UT) TABS Take 1 tablet by mouth daily. 12/02/21  Yes Sandford Craze, NP  lisdexamfetamine (VYVANSE) 50 MG capsule Take 1 capsule (50 mg total) by mouth daily. 08/31/23  Yes Sandford Craze, NP  metoprolol succinate (TOPROL-XL) 50 MG 24 hr tablet Take 0.5 tablets (25 mg total) by mouth daily. Take with or immediately following a meal. 08/31/23  Yes Sandford Craze, NP  ondansetron (ZOFRAN-ODT) 4 MG disintegrating tablet Dissolve 1-2 tablets (4-8 mg total) by mouth every 8 (eight) hours as needed. 09/21/22  Yes Anson Fret, MD  botulinum toxin Type A (BOTOX) 200 units injection Provider to inject 155 units into the muscles of the head and neck every 12 weeks. Discard remainder. 12/10/22   Anson Fret, MD  docusate sodium (COLACE) 50 MG capsule Take 50 mg by mouth 2 (two) times daily.    [provider]  famotidine (PEPCID) 20 MG tablet Take 20 mg by mouth daily as needed for heartburn or indigestion.     [provider]  Ferrous Sulfate (IRON) 325 (65 Fe) MG TABS Take 1 tablet (325 mg total) by mouth daily. 08/30/20   Sandford Craze, NP  fluticasone (FLONASE) 50 MCG/ACT nasal spray Place 2  sprays into both nostrils daily. 05/31/22   Eustace Moore, MD  levocetirizine (XYZAL) 5 MG tablet Take 1 tablet (5 mg total) by mouth every evening. 06/07/23   Sandford Craze, NP  Naltrexone-buPROPion HCl ER (CONTRAVE) 8-90 MG TB12 Take 1 tablet twice daily for 1 week; then 2 tablets in the morning and 1 tablet in the evening for 1 week; and then 2 tablets twice daily. 08/09/23   Sandford Craze, NP  Rimegepant Sulfate (NURTEC) 75 MG TBDP DISSOLVE 1 TABLET BY MOUTH DAILY AS NEEDED FOR MIGRAINES. TAKE AS CLOSE TO ONSET  OF MIGRAINE AS POSSIBLE. ONE DAILY MAXIMUM. 10/26/22 10/26/23  Anson Fret, MD  rizatriptan (MAXALT-MLT) 10 MG disintegrating tablet Take 1 tablet (10 mg total) by mouth as needed for migraine. May repeat in 2 hours if needed 05/02/19   Anson Fret, MD    Current Outpatient Medications  Medication Sig Dispense Refill   amLODipine (NORVASC) 5 MG tablet Take 1 tablet (5 mg total) by mouth daily. 90 tablet 1   Cholecalciferol (VITAMIN D3) 75 MCG (3000 UT) TABS Take 1 tablet by mouth daily. 30 tablet    lisdexamfetamine (VYVANSE) 50 MG capsule Take 1 capsule (50 mg total) by mouth daily. 30 capsule 0   metoprolol succinate (TOPROL-XL) 50 MG 24 hr tablet Take 0.5 tablets (25 mg total) by mouth daily. Take with or immediately following a meal. 45 tablet 1   ondansetron (ZOFRAN-ODT) 4 MG disintegrating tablet Dissolve 1-2 tablets (4-8 mg total) by mouth every 8 (eight) hours as needed. 30 tablet 3   botulinum toxin Type A (BOTOX) 200 units injection Provider to inject 155 units into the muscles of the head and neck every 12 weeks. Discard remainder. 1 each 3   docusate sodium (COLACE) 50 MG capsule Take 50 mg by mouth 2 (two) times daily.     famotidine (PEPCID) 20 MG tablet Take 20 mg by mouth daily as needed for heartburn or indigestion.      Ferrous Sulfate (IRON) 325 (65 Fe) MG TABS Take 1 tablet (325 mg total) by mouth daily. 30 tablet 0   fluticasone (FLONASE) 50 MCG/ACT nasal spray Place 2 sprays into both nostrils daily. 16 g 0   levocetirizine (XYZAL) 5 MG tablet Take 1 tablet (5 mg total) by mouth every evening. 90 tablet 4   Naltrexone-buPROPion HCl ER (CONTRAVE) 8-90 MG TB12 Take 1 tablet twice daily for 1 week; then 2 tablets in the morning and 1 tablet in the evening for 1 week; and then 2 tablets twice daily. 360 tablet 0   Rimegepant Sulfate (NURTEC) 75 MG TBDP DISSOLVE 1 TABLET BY MOUTH DAILY AS NEEDED FOR MIGRAINES. TAKE AS CLOSE TO ONSET OF MIGRAINE AS POSSIBLE. ONE DAILY  MAXIMUM. 16 tablet 11   rizatriptan (MAXALT-MLT) 10 MG disintegrating tablet Take 1 tablet (10 mg total) by mouth as needed for migraine. May repeat in 2 hours if needed 9 tablet 11   Current Facility-Administered Medications  Medication Dose Route Frequency Provider Last Rate Last Admin   0.9 %  sodium chloride infusion  500 mL Intravenous Once Imogene Burn, MD        Allergies as of 09/08/2023 - Review Complete 09/08/2023  Allergen Reaction Noted   Codeine Other (See Comments) 12/26/2010   Lisinopril Other (See Comments)    Sulfa antibiotics Hives 06/16/2019    Family History  Problem Relation Age of Onset   Hypertension Mother    Arthritis Mother  Migraines Mother    Obesity Mother    Cancer Mother        sinus tumor   Hypertension Father    Alcohol abuse Father    Arthritis Father    Obesity Father    Migraines Sister    Ulcerative colitis Sister    Migraines Maternal Aunt    Colon cancer Maternal Grandmother 94   Cancer Maternal Grandmother        colon diagnosed at age 29   Dementia Maternal Grandmother    ADD / ADHD Son    Heart disease Neg Hx    Stomach cancer Neg Hx    Esophageal cancer Neg Hx     Social History   Socioeconomic History   Marital status: Married    Spouse name: Thad   Number of children: 1   Years of education: Not on file   Highest education level: Associate degree: academic program  Occupational History   Occupation: Charity fundraiser   Occupation: Engineer, civil (consulting)  Tobacco Use   Smoking status: Never   Smokeless tobacco: Never  Vaping Use   Vaping status: Never Used  Substance and Sexual Activity   Alcohol use: No    Alcohol/week: 0.0 standard drinks of alcohol   Drug use: No   Sexual activity: Yes    Birth control/protection: Condom  Other Topics Concern   Not on file  Social History Narrative   Regular exercise: no regular exercise   Caffeine use:  1-2 coffee daily   Son born 2010   Works as a Engineer, 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)   Married    Lives at home with her husband and child      Social Determinants of Health   Financial Resource Strain: Low Risk  (06/06/2023)   Overall Financial Resource Strain (CARDIA)    Difficulty of Paying Living Expenses: Not hard at all  Food Insecurity: No Food Insecurity (06/06/2023)   Hunger Vital Sign    Worried About Running Out of Food in the Last Year: Never true    Ran Out of Food in the Last Year: Never true  Transportation Needs: No Transportation Needs (06/06/2023)   PRAPARE - Administrator, Civil Service (Medical): No    Lack of Transportation (Non-Medical): No  Physical Activity: Not on file  Stress: Not on file  Social Connections: Socially Integrated (06/06/2023)   Social Connection and Isolation Panel [NHANES]    Frequency of Communication with Friends and Family: More than three times a week    Frequency of Social Gatherings with Friends and Family: Twice a week    Attends Religious Services: More than 4 times per year    Active Member of [employer] or Organizations: Yes    Attends Engineer, structural: More than 4 times per year    Marital Status: Married  Catering manager Violence: Not on file    Physical Exam: Vital signs in last 24 hours: BP 136/80   Pulse 66   Temp 97.7 F (36.5 C)   Ht 5\' 4"  (1.626 m)   Wt 161 lb (73 kg)   LMP 09/01/2023 (Exact Date)   SpO2 100%   BMI 27.64 kg/m  GEN: NAD EYE: Sclerae anicteric ENT: MMM CV: Non-tachycardic Pulm: No increased WOB GI: Soft NEURO:  Alert & Oriented   Eulah Pont, MD [facility]   09/08/2023 3:21 PM

## 2023-09-08 NOTE — Progress Notes (Signed)
Sedate, gd SR, tolerated procedure well, VSS, report to RN 

## 2023-09-09 ENCOUNTER — Telehealth: Payer: Self-pay

## 2023-09-09 NOTE — Telephone Encounter (Signed)
  Follow up Call-     09/08/2023    2:46 PM  Call back number  Post procedure Call Back phone  # [phone]  Permission to leave phone message Yes     Patient questions:  Do you have a fever, pain , or abdominal swelling? No. Pain Score  0 *  Have you tolerated food without any problems? Yes.    Have you been able to return to your normal activities? Yes.    Do you have any questions about your discharge instructions: Diet   No. Medications  No. Follow up visit  No.  Do you have questions or concerns about your Care? No.  Actions: * If pain score is 4 or above: No action needed, pain <4.

## 2023-09-12 ENCOUNTER — Telehealth: Payer: 59

## 2023-09-12 DIAGNOSIS — R3989 Other symptoms and signs involving the genitourinary system: Secondary | ICD-10-CM | POA: Diagnosis not present

## 2023-09-13 LAB — SURGICAL PATHOLOGY

## 2023-09-13 MED ORDER — NITROFURANTOIN MONOHYD MACRO 100 MG PO CAPS
100.0000 mg | ORAL_CAPSULE | Freq: Two times a day (BID) | ORAL | 0 refills | Status: DC
Start: 2023-09-13 — End: 2023-11-18

## 2023-09-13 NOTE — Progress Notes (Signed)
E-Visit for Urinary Problems  We are sorry that you are not feeling well.  Here is how we plan to help!  Based on what you shared with me it looks like you most likely have a simple urinary tract infection.  A UTI (Urinary Tract Infection) is a bacterial infection of the bladder.  Most cases of urinary tract infections are simple to treat but a key part of your care is to encourage you to drink plenty of fluids and watch your symptoms carefully.  I have prescribed MacroBid 100 mg twice a day for 5 days.  Your symptoms should gradually improve. Call us if the burning in your urine worsens, you develop worsening fever, back pain or pelvic pain or if your symptoms do not resolve after completing the antibiotic.  Urinary tract infections can be prevented by drinking plenty of water to keep your body hydrated.  Also be sure when you wipe, wipe from front to back and don't hold it in!  If possible, empty your bladder every 4 hours.  HOME CARE Drink plenty of fluids Compete the full course of the antibiotics even if the symptoms resolve Remember, when you need to go.go. Holding in your urine can increase the likelihood of getting a UTI! GET HELP RIGHT AWAY IF: You cannot urinate You get a high fever Worsening back pain occurs You see blood in your urine You feel sick to your stomach or throw up You feel like you are going to pass out  MAKE SURE YOU  Understand these instructions. Will watch your condition. Will get help right away if you are not doing well or get worse.   Thank you for choosing an e-visit.  Your e-visit answers were reviewed by a board certified advanced clinical practitioner to complete your personal care plan. Depending upon the condition, your plan could have included both over the counter or prescription medications.  Please review your pharmacy choice. Make sure the pharmacy is open so you can pick up prescription now. If there is a problem, you may contact your  provider through MyChart messaging and have the prescription routed to another pharmacy.  Your safety is important to us. If you have drug allergies check your prescription carefully.   For the next 24 hours you can use MyChart to ask questions about today's visit, request a non-urgent call back, or ask for a work or school excuse. You will get an email in the next two days asking about your experience. I hope that your e-visit has been valuable and will speed your recovery.  I have spent 5 minutes in review of e-visit questionnaire, review and updating patient chart, medical decision making and response to patient.   Yavier Snider M Heberto Sturdevant, PA-C  

## 2023-09-14 ENCOUNTER — Encounter: Payer: Self-pay | Admitting: Internal Medicine

## 2023-09-15 ENCOUNTER — Other Ambulatory Visit: Payer: Self-pay

## 2023-09-15 DIAGNOSIS — D259 Leiomyoma of uterus, unspecified: Secondary | ICD-10-CM | POA: Diagnosis not present

## 2023-09-15 DIAGNOSIS — N920 Excessive and frequent menstruation with regular cycle: Secondary | ICD-10-CM | POA: Diagnosis not present

## 2023-09-16 ENCOUNTER — Other Ambulatory Visit: Payer: Self-pay | Admitting: Obstetrics and Gynecology

## 2023-09-16 ENCOUNTER — Other Ambulatory Visit: Payer: Self-pay | Admitting: Interventional Radiology

## 2023-09-16 DIAGNOSIS — D259 Leiomyoma of uterus, unspecified: Secondary | ICD-10-CM

## 2023-10-01 ENCOUNTER — Ambulatory Visit
Admission: RE | Admit: 2023-10-01 | Discharge: 2023-10-01 | Disposition: A | Discharge status: Home / Self Care | Payer: 59 | Source: Ambulatory Visit | Attending: Interventional Radiology

## 2023-10-01 DIAGNOSIS — D259 Leiomyoma of uterus, unspecified: Secondary | ICD-10-CM | POA: Diagnosis not present

## 2023-10-01 DIAGNOSIS — N920 Excessive and frequent menstruation with regular cycle: Secondary | ICD-10-CM | POA: Diagnosis not present

## 2023-10-01 MED ORDER — GADOPICLENOL 0.5 MMOL/ML IV SOLN
7.0000 mL | Freq: Once | INTRAVENOUS | Status: AC | PRN
  Administered 2023-10-01: 7 mL via INTRAVENOUS

## 2023-10-12 ENCOUNTER — Ambulatory Visit
Admission: RE | Admit: 2023-10-12 | Discharge: 2023-10-12 | Disposition: A | Discharge status: Home / Self Care | Payer: 59 | Source: Ambulatory Visit | Attending: Obstetrics and Gynecology | Admitting: Obstetrics and Gynecology

## 2023-10-12 DIAGNOSIS — D259 Leiomyoma of uterus, unspecified: Secondary | ICD-10-CM | POA: Diagnosis not present

## 2023-10-12 HISTORY — PX: IR RADIOLOGIST EVAL & MGMT: IMG5224

## 2023-10-12 NOTE — Consult Note (Signed)
Chief Complaint: Patient was seen in consultation today for uterine fibroids at the request of Taavon,Richard  Referring Physician(s): Taavon,Richard  History of Present Illness: Kaitlin Foster is a 42 y.o. female (G1, P65) with a history of a symptomatic uterine fibroids.  She has known about her fibroid diagnosis for at least the past 5 years.  Her cycles occur every 4 to 4-1/2 weeks regularly and last for 5 days.  The first 2 to 3 days are extremely heavy requiring use of both superabsorbent pads and tampons every 1-2 hours.  She passes nickel sized clots on her heavier days and has been diagnosed with anemia.  She stays anemic and symptomatic and less she continuously takes iron supplementation.  She finds the iron supplementation to be challenging as the iron pills cause gastrointestinal upset.  She denies significant bulk related symptoms.  Overall, her symptoms are moderately severe.  She scored a 44 out of 100 on the uterine fibroid symptom severity questionnaire.  Her symptoms are also moderately detracting from her quality of life.  She scored a 58 out of 100 on the uterine fibroid symptom and health-related quality of life questionnaire.  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;   2 stage embolization ACA supply to AVM @ [facility]  Sept 11th and 18th 2008 then craniotomy resection AVM 07-28-2007   Hypertension    Left ureteral stone    Migraines    Nephrolithiasis    left renal nonobstructive stone per CT 06-14-2019   Uterine fibroid     Past  Surgical History:  Procedure Laterality Date   CEREBRAL EMBOLIZATION  Sept 11th and 18th, 2008   @[facility]    2 staged embolization left ACA supply to AVM   CESAREAN SECTION  07/06/2009   CRANIOTOMY  07-29-2007   @ [facility]   resection left frontal AVM   IR RADIOLOGIST EVAL & MGMT  10/12/2023    Allergies: Sulfa antibiotics and Lisinopril  Medications: Prior to Admission medications   Medication Sig Start Date End Date Taking? Authorizing Provider  amLODipine (NORVASC) 5 MG tablet Take 1 tablet (5 mg total) by mouth daily. 05/22/23 05/21/24  Sandford Craze, NP  botulinum toxin Type A (BOTOX) 200 units injection Provider to inject 155 units into the muscles of the head and neck every 12 weeks. Discard remainder. 12/10/22   Anson Fret, MD  Cholecalciferol (VITAMIN D3) 75 MCG (3000 UT) TABS Take 1 tablet by mouth daily. 12/02/21   Sandford Craze, NP  docusate sodium (COLACE) 50 MG capsule Take 50 mg by mouth 2 (two) times daily.    [provider]  famotidine (PEPCID) 20 MG tablet Take 20 mg by mouth daily as needed for heartburn or indigestion.     [provider]  Ferrous Sulfate (IRON) 325 (65 Fe) MG TABS Take 1 tablet (325 mg total) by mouth daily. 08/30/20   Sandford Craze, NP  fluticasone (FLONASE) 50 MCG/ACT nasal spray Place 2 sprays into both nostrils daily. 05/31/22   Eustace Moore, MD  levocetirizine (XYZAL) 5 MG tablet Take 1 tablet (5 mg total) by mouth every evening. 06/07/23   Sandford Craze, NP  lisdexamfetamine (VYVANSE) 50 MG capsule Take 1 capsule (50 mg total) by mouth daily. 08/31/23   Sandford Craze, NP  metoprolol succinate (TOPROL-XL) 50 MG 24 hr tablet Take 0.5 tablets (25 mg total) by mouth daily. Take with or immediately following a meal. 08/31/23   Sandford Craze, NP  Naltrexone-buPROPion HCl ER (CONTRAVE) 8-90 MG TB12 Take 1 tablet twice daily for 1 week; then 2 tablets in the morning and 1 tablet in the evening for 1 week;  and then 2 tablets twice daily. 08/09/23   Sandford Craze, NP  nitrofurantoin, macrocrystal-monohydrate, (MACROBID) 100 MG capsule Take 1 capsule (100 mg total) by mouth 2 (two) times daily. 09/13/23   Margaretann Loveless, PA-C  omeprazole (PRILOSEC) 40 MG capsule Take 1 capsule (40 mg total) by mouth daily. 09/08/23   Imogene Burn, MD  ondansetron (ZOFRAN-ODT) 4 MG disintegrating tablet Dissolve 1-2 tablets (4-8 mg total) by mouth every 8 (eight) hours as needed. 09/21/22   Anson Fret, MD  Rimegepant Sulfate (NURTEC) 75 MG TBDP DISSOLVE 1 TABLET BY MOUTH DAILY AS NEEDED FOR MIGRAINES. TAKE AS CLOSE TO ONSET OF MIGRAINE AS POSSIBLE. ONE DAILY MAXIMUM. 10/26/22 10/26/23  Anson Fret, MD  rizatriptan (MAXALT-MLT) 10 MG disintegrating tablet Take 1 tablet (10 mg total) by mouth as needed for migraine. May repeat in 2 hours if needed 05/02/19   Anson Fret, MD     Family History  Problem Relation Age of Onset   Hypertension Mother    Arthritis Mother    Migraines Mother    Obesity Mother    Cancer Mother        sinus tumor   Hypertension Father    Alcohol abuse Father    Arthritis Father    Obesity Father    Migraines Sister    Ulcerative colitis Sister    Migraines Maternal Aunt    Colon cancer Maternal Grandmother 46   Cancer Maternal Grandmother        colon diagnosed at age 43   Dementia Maternal Grandmother    ADD / ADHD Son    Heart disease Neg Hx    Stomach cancer Neg Hx    Esophageal cancer Neg Hx     Social History   Socioeconomic History   Marital status: Married    Spouse name: Thad   Number of children: 1   Years of education: Not on file   Highest education level: Associate degree: academic program  Occupational History   Occupation: Charity fundraiser   Occupation: Engineer, civil (consulting)  Tobacco Use   Smoking status: Never   Smokeless tobacco: Never  Vaping Use   Vaping status: Never Used  Substance and Sexual Activity   Alcohol use: No    Alcohol/week: 0.0 standard  drinks of alcohol   Drug use: No   Sexual activity: Yes    Birth control/protection: Condom  Other Topics Concern   Not on file  Social History Narrative   Regular exercise: no regular exercise   Caffeine use:  1-2 coffee daily   Son born 2010   Works as a Engineer, 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)   Married   Lives at home with her husband and child      Social Determinants of Health   Financial Resource Strain: Low Risk  (06/06/2023)   Overall Financial Resource Strain (CARDIA)  Difficulty of Paying Living Expenses: Not hard at all  Food Insecurity: No Food Insecurity (06/06/2023)   Hunger Vital Sign    Worried About Running Out of Food in the Last Year: Never true    Ran Out of Food in the Last Year: Never true  Transportation Needs: No Transportation Needs (06/06/2023)   PRAPARE - Administrator, Civil Service (Medical): No    Lack of Transportation (Non-Medical): No  Physical Activity: Not on file  Stress: Not on file  Social Connections: Socially Integrated (06/06/2023)   Social Connection and Isolation Panel [NHANES]    Frequency of Communication with Friends and Family: More than three times a week    Frequency of Social Gatherings with Friends and Family: Twice a week    Attends Religious Services: More than 4 times per year    Active Member of [employer] or Organizations: Yes    Attends Engineer, structural: More than 4 times per year    Marital Status: Married    Review of Systems: A 12 point ROS discussed and pertinent positives are indicated in the HPI above.  All other systems are negative.  Review of Systems  Vital Signs: BP (!) 140/77 (BP Location: Right Arm, Patient Position: Sitting, Cuff Size: Normal)   Pulse 80   Temp 97.8 F (36.6 C)   Resp 16   SpO2 99%     Physical Exam Constitutional:      General: She is not in acute distress.    Appearance: Normal appearance.  HENT:     Head: Normocephalic and atraumatic.  Eyes:     General:  No scleral icterus. Cardiovascular:     Rate and Rhythm: Normal rate.  Pulmonary:     Effort: Pulmonary effort is normal.  Abdominal:     General: There is no distension.     Palpations: Abdomen is soft. There is mass.     Tenderness: There is no abdominal tenderness.  Skin:    General: Skin is warm and dry.  Neurological:     General: No focal deficit present.     Mental Status: She is alert and oriented to person, place, and time.  Psychiatric:        Mood and Affect: Mood normal.        Behavior: Behavior normal.       Imaging: IR Radiologist Eval & Mgmt  Result Date: 10/12/2023 EXAM: NEW PATIENT OFFICE VISIT CHIEF COMPLAINT: SEE EPIC NOTE HISTORY OF PRESENT ILLNESS: SEE EPIC NOTE REVIEW OF SYSTEMS: SEE EPIC NOTE PHYSICAL EXAMINATION: SEE EPIC NOTE ASSESSMENT AND PLAN: SEE EPIC NOTE Electronically Signed   By: Malachy Moan M.D.   On: 10/12/2023 14:22    Labs:  CBC: Recent Labs    03/08/23 1027 06/07/23 1148  WBC 4.8 5.8  HGB 12.5 10.5*  HCT 39.1 34.7*  PLT 311.0 321.0    COAGS: No results for input(s): "INR", "APTT" in the last 8760 hours.  BMP: Recent Labs    03/08/23 1027  NA 139  K 4.1  CL 105  CO2 26  GLUCOSE 82  BUN 13  CALCIUM 9.2  CREATININE 0.74    LIVER FUNCTION TESTS: Recent Labs    03/08/23 1027  BILITOT 0.3  AST 16  ALT 9  ALKPHOS 55  PROT 6.4  ALBUMIN 4.0    TUMOR MARKERS: No results for input(s): "AFPTM", "CEA", "CA199", "CHROMGRNA" in the last 8760 hours.  Assessment and Plan:  History pleasant 42 year old  female with symptomatic uterine fibroids.  Her primary symptoms are menorrhagia resulting in persistent anemia requiring iron supplementation.  She has discussed myomectomy/hysterectomy with her obstetrician.  Today, we discussed minimally invasive treatment options including uterine artery embolization.  I explained the risks, alternatives and benefits to the procedure as well as answered all of her  questions.  She is an excellent candidate for uterine artery embolization and she is motivated to pursue treatment.  1.) Please schedule for out-pt superior hypogastric nerve block and uterine artery embolization.    Thank you for this interesting consult.  I greatly enjoyed meeting [employer] and look forward to participating in their care.  A copy of this report was sent to the requesting provider on this date.  Electronically Signed: Sterling Big 10/12/2023, 3:43 PM   I spent a total of 40 Minutes  in face to face in clinical consultation, greater than 50% of which was counseling/coordinating care for symptomatic uterine fibroids

## 2023-10-13 ENCOUNTER — Other Ambulatory Visit: Payer: Self-pay | Admitting: Interventional Radiology

## 2023-10-13 DIAGNOSIS — D259 Leiomyoma of uterus, unspecified: Secondary | ICD-10-CM

## 2023-10-22 ENCOUNTER — Encounter: Payer: Self-pay | Admitting: Internal Medicine

## 2023-10-22 ENCOUNTER — Telehealth: Payer: Self-pay

## 2023-10-22 MED ORDER — NAPROXEN 500 MG PO TABS: 500.0000 mg | ORAL_TABLET | Freq: Two times a day (BID) | ORAL | 0 refills | Status: AC

## 2023-10-22 MED ORDER — DOCUSATE SODIUM 100 MG PO CAPS: 100.0000 mg | ORAL_CAPSULE | Freq: Two times a day (BID) | ORAL | 0 refills | Status: AC

## 2023-10-22 MED ORDER — ONDANSETRON HCL 8 MG PO TABS: 8.0000 mg | ORAL_TABLET | Freq: Three times a day (TID) | ORAL | 0 refills | Status: DC | PRN

## 2023-10-22 MED ORDER — PROMETHAZINE HCL 12.5 MG PO TABS: 12.5000 mg | ORAL_TABLET | ORAL | 0 refills | Status: DC | PRN

## 2023-10-22 NOTE — Discharge Instructions (Signed)
Uterine Artery Embolization After Care   What can I expect after the procedure?   After the procedure, it is common to have:   Mild pain or discomfort at the arterial entry site   Uterine Cramping   Cramps can vary in strength from what you would consider to be a bad menstrual cycle all the way up to as severe as labor pains.   The cramping is typically the most severe the afternoon and evening the day of the procedure and begin to improve the next day and each day thereafter.   Vaginal bleeding. Your cycle may become irregular the first several months.   Vaginal discharge. We recommend you wear a panty liner for first 4-6 weeks following your procedure.    Nausea and vomiting.      Follow these instructions at home:   Medicines   Take your medicine exactly as told, at the same time every day. This is vital to helping you with a smooth recovery.   Zofran (ondansetron) is used to prevent nausea before it starts.  You will have a prescription to take 8 mg of this medication every 8 hours.  You should take this even if you don't feel nauseated because it is meant to prevent the nausea from occurring.  Once you get nauseated and start to vomit, you may not be able to keep your other medicines down and your pain can be left untreated.  You can take this with every other dose of the oxycodone/acetaminophen   Naproxen is a non-steroidal anti-inflammatory medicine called and is critical in keeping your inflammation and pain under control.  You must take this every 12 hrs. We recommend 8 am and 8 pm.    Percocet (oxycodone/acetaminophen) is a combination narcotic pain medication with Tylenol.  This is to help with your pain.  Take it every 4 hours regardless of your pain level the first 2 days.  Set an alarm to wake up so you don't miss a dose overnight and get behind on your pain control.  After the first 48 hrs, if your pain is minimal you can take only as needed.    Colace (docusate  sodium) is a stool softener to help prevent constipation.  The pain medications often cause constipation which can be particularly uncomfortable after [place].  We recommend you take this at 8 am and 8 pm with your naproxen.    Phenergan (promethazine) is another medication for nausea.  If you still feel sick to your stomach or vomit despite the Zofran (ondansetron) take this medicine every 8 hours as needed.    Incision care   Leave your bandage on for 24 hrs and keep the area dry   You may remove the bandage after 24 hrs and then shower as normal.    Do not submerge in a bath, pool or hot tub until the small incision is completely healed (5-7 days).   Activity   Do not lift anything that is heavier than 5 lb (2.3 kg)for the first 3 days.     Return to your normal activities after day 5.  Take it slowly and listen to your body. Ask your health care provider what activities are safe for you.   General instructions   Many women find a hot water bottle or heating pad helpful when placed on the lower abdomen.  This is fine to do if it helps your cramps.    Do not use any products that contain nicotine or tobacco.   These products include cigarettes, chewing tobacco, and vaping devices, such as e-cigarettes. These can delay incision healing. If you need help quitting, ask your health care provider.   Do not have sex or put anything in your vagina for at least 14 days.    Do not drink alcohol until your health care provider says it is okay.   Keep all follow-up visits. This is important.      Please contact our office at [phone] for the following symptoms:   You have a fever.   You have more redness, swelling, or pain around your incision.   You have more fluid or blood coming from your incision site.   Your incision feels warm to the touch.   You have pus or a bad smell coming from your incision or vagina.   You have a rash.   You have nausea, or you cannot eat or drink  anything without vomiting.   You have a vaginal discharge that is not getting lighter.   Get help right away if:   You have trouble breathing.   You have chest pain.   You have severe pain in your abdomen, and it does not get better with medicine.   You have leg pain or leg swelling.   You feel dizzy, or you faint.   Do not wait to see if the symptoms will go away.   Do not drive yourself to the hospital.      These symptoms may be an emergency.    Get help right away. Call 911.   Summary   After the procedure, it is common to have cramps, or pain or discomfort at the incision site. You will be given pain medicine.   Follow instructions from your health care provider about how to take care of your incision. Check your incision area every day for signs of infection.   Take over-the-counter and prescription medicines only as told by your health care provider.   Contact your health care provider if you have symptoms of infection or other symptoms that do not get better with treatment.   Thanks for visiting [facility] ! 

## 2023-10-22 NOTE — Telephone Encounter (Signed)
Pt. Is to take Naproxen 500 mg twice a day for 7 days. Colace 100 mg twice a day for 7 days. Percocet 7.5/325mg every 4 hours for 5 days for pain. Zofran 8mg every 8 hours for three days and then as needed for nausea/vomiting. Phenergan 12.5 mg every four hours as needed for nausea/vomiting.  

## 2023-10-25 ENCOUNTER — Other Ambulatory Visit: Payer: Self-pay | Admitting: Neurology

## 2023-10-25 ENCOUNTER — Other Ambulatory Visit: Payer: Self-pay | Admitting: Family

## 2023-10-26 ENCOUNTER — Other Ambulatory Visit: Payer: Self-pay | Admitting: Interventional Radiology

## 2023-10-26 ENCOUNTER — Other Ambulatory Visit: Payer: Self-pay

## 2023-10-26 ENCOUNTER — Ambulatory Visit
Admission: RE | Admit: 2023-10-26 | Discharge: 2023-10-26 | Disposition: A | Discharge status: Home / Self Care | Payer: 59 | Source: Ambulatory Visit | Attending: Interventional Radiology | Admitting: Interventional Radiology

## 2023-10-26 ENCOUNTER — Other Ambulatory Visit (HOSPITAL_BASED_OUTPATIENT_CLINIC_OR_DEPARTMENT_OTHER): Payer: Self-pay

## 2023-10-26 DIAGNOSIS — D259 Leiomyoma of uterus, unspecified: Secondary | ICD-10-CM | POA: Diagnosis not present

## 2023-10-26 DIAGNOSIS — D219 Benign neoplasm of connective and other soft tissue, unspecified: Secondary | ICD-10-CM | POA: Diagnosis not present

## 2023-10-26 HISTORY — PX: IR EMBO TUMOR ORGAN ISCHEMIA INFARCT INC GUIDE ROADMAPPING: IMG5449

## 2023-10-26 HISTORY — PX: IR FLUORO GUIDED NEEDLE PLC ASPIRATION/INJECTION LOC: IMG2395

## 2023-10-26 MED ORDER — KETOROLAC TROMETHAMINE 30 MG/ML IJ SOLN
30.0000 mg | Freq: Once | INTRAMUSCULAR | Status: AC
  Administered 2023-10-26: 30 mg via INTRAVENOUS

## 2023-10-26 MED ORDER — KETOROLAC TROMETHAMINE 30 MG/ML IJ SOLN
30.0000 mg | Freq: Once | INTRAMUSCULAR | Status: AC
  Administered 2023-10-26: 30 mg via INTRAMUSCULAR

## 2023-10-26 MED ORDER — IOPAMIDOL (ISOVUE-300) INJECTION 61%
135.0000 mL | Freq: Once | INTRAVENOUS | Status: AC | PRN
  Administered 2023-10-26: 135 mL via INTRA_ARTERIAL

## 2023-10-26 MED ORDER — ONDANSETRON HCL 4 MG/2ML IJ SOLN
8.0000 mg | Freq: Once | INTRAMUSCULAR | Status: AC
  Administered 2023-10-26: 8 mg via INTRAVENOUS

## 2023-10-26 MED ORDER — LISDEXAMFETAMINE DIMESYLATE 50 MG PO CAPS
50.0000 mg | ORAL_CAPSULE | Freq: Every day | ORAL | 0 refills | Status: DC
  Filled 2023-10-26: qty 30, 30d supply, fill #0

## 2023-10-26 MED ORDER — FENTANYL CITRATE PF 50 MCG/ML IJ SOSY: 25.0000 ug | PREFILLED_SYRINGE | INTRAMUSCULAR | Status: DC | PRN

## 2023-10-26 MED ORDER — PROMETHAZINE HCL 25 MG PO TABS
25.0000 mg | ORAL_TABLET | Freq: Once | ORAL | Status: AC
  Administered 2023-10-26: 25 mg via ORAL

## 2023-10-26 MED ORDER — CEFAZOLIN SODIUM-DEXTROSE 2-4 GM/100ML-% IV SOLN
2.0000 g | INTRAVENOUS | Status: AC
  Administered 2023-10-26: 2 g via INTRAVENOUS

## 2023-10-26 MED ORDER — ACETAMINOPHEN 10 MG/ML IV SOLN
1000.0000 mg | Freq: Once | INTRAVENOUS | Status: AC
  Administered 2023-10-26: 1000 mg via INTRAVENOUS

## 2023-10-26 MED ORDER — SODIUM CHLORIDE 0.9 % IV SOLN: INTRAVENOUS | Status: DC

## 2023-10-26 MED ORDER — HYDROMORPHONE HCL 1 MG/ML IJ SOLN: INTRAMUSCULAR | Status: AC | PRN

## 2023-10-26 MED ORDER — AMLODIPINE BESYLATE 5 MG PO TABS
5.0000 mg | ORAL_TABLET | Freq: Every day | ORAL | 1 refills | Status: DC
  Filled 2023-10-26 – 2024-01-12 (×2): qty 90, 90d supply, fill #0
  Filled 2024-05-10: qty 90, 90d supply, fill #1

## 2023-10-26 MED ORDER — FENTANYL CITRATE (PF) 100 MCG/2ML IJ SOLN
INTRAMUSCULAR | Status: AC | PRN
  Administered 2023-10-26 (×4): 50 ug via INTRAVENOUS

## 2023-10-26 MED ORDER — SODIUM CHLORIDE 0.9 % IV SOLN
INTRAVENOUS | Status: AC | PRN
  Administered 2023-10-26: 250 mL via INTRAVENOUS

## 2023-10-26 MED ORDER — PANTOPRAZOLE SODIUM 40 MG IV SOLR
40.0000 mg | Freq: Once | INTRAVENOUS | Status: AC
  Administered 2023-10-26: 40 mg via INTRAVENOUS

## 2023-10-26 MED ORDER — SODIUM CHLORIDE 0.9 % IV SOLN
10.0000 mg | Freq: Once | INTRAVENOUS | Status: AC
  Administered 2023-10-26: 10 mg via INTRAVENOUS

## 2023-10-26 MED ORDER — MIDAZOLAM HCL 2 MG/2ML IJ SOLN: 1.0000 mg | INTRAMUSCULAR | Status: DC | PRN

## 2023-10-26 MED ORDER — HYDROMORPHONE HCL 1 MG/ML IJ SOLN
1.0000 mg | Freq: Once | INTRAMUSCULAR | Status: AC
  Administered 2023-10-26: 1 mg via INTRAVENOUS

## 2023-10-26 MED ORDER — MIDAZOLAM HCL 2 MG/2ML IJ SOLN
INTRAMUSCULAR | Status: AC | PRN
  Administered 2023-10-26 (×4): 1 mg via INTRAVENOUS

## 2023-10-26 MED ORDER — SODIUM CHLORIDE 0.9 % IV SOLN
INTRAVENOUS | Status: DC | PRN
  Administered 2023-10-26: 10 mL/h via INTRAVENOUS

## 2023-10-26 NOTE — Progress Notes (Addendum)
Urine dipsitck pregnancy test negative.

## 2023-10-27 ENCOUNTER — Other Ambulatory Visit (HOSPITAL_BASED_OUTPATIENT_CLINIC_OR_DEPARTMENT_OTHER): Payer: Self-pay

## 2023-10-27 ENCOUNTER — Ambulatory Visit: Payer: 59 | Admitting: Family

## 2023-10-27 MED ORDER — ONDANSETRON 4 MG PO TBDP
4.0000 mg | ORAL_TABLET | Freq: Three times a day (TID) | ORAL | 3 refills | Status: DC | PRN
  Filled 2023-10-27 – 2024-02-18 (×2): qty 30, 5d supply, fill #0
  Filled 2024-02-18: qty 30, 5d supply, fill #1
  Filled 2024-02-18: qty 30, 5d supply, fill #0
  Filled 2024-07-27: qty 30, 5d supply, fill #1

## 2023-11-01 ENCOUNTER — Telehealth: Payer: Self-pay

## 2023-11-01 NOTE — Telephone Encounter (Signed)
Phone call to pt to follow up from her [place] on 10/26/23. Pt. Denies any signs of bleeding, infection, redness at the right femoral site, drainage at the site, fever, nausea, or vomiting. Pt has no complaints at this time. Dr. Archer Asa will call her within the week to check on her status post-procedure. Pt advised to call back if anything were to change or any concerns arise and we will arrange an in person appointment. Pt verbalized understanding.

## 2023-11-02 ENCOUNTER — Other Ambulatory Visit: Payer: Self-pay | Admitting: Neurology

## 2023-11-02 ENCOUNTER — Other Ambulatory Visit: Payer: Self-pay

## 2023-11-02 DIAGNOSIS — G43711 Chronic migraine without aura, intractable, with status migrainosus: Secondary | ICD-10-CM

## 2023-11-02 NOTE — Progress Notes (Signed)
Specialty Pharmacy Refill Coordination Note  Kaitlin Foster is a 42 y.o. female contacted today regarding refills of specialty medication(s) OnabotulinumtoxinA (Botox)   Patient requested No data recorded  Delivery date: 11/15/23   Verified address: [address] [place] [address]   Medication will be filled on 11/12/23 pending a refill request.

## 2023-11-04 ENCOUNTER — Other Ambulatory Visit: Payer: Self-pay

## 2023-11-04 ENCOUNTER — Other Ambulatory Visit (HOSPITAL_COMMUNITY): Payer: Self-pay

## 2023-11-04 MED ORDER — BOTOX 200 UNITS IJ SOLR
INTRAMUSCULAR | 3 refills | Status: DC
  Filled 2023-11-04: qty 1, 84d supply, fill #0
  Filled 2024-01-24: qty 1, 84d supply, fill #1
  Filled 2024-04-27: qty 1, 84d supply, fill #2
  Filled 2024-07-27: qty 1, 84d supply, fill #3

## 2023-11-11 NOTE — Telephone Encounter (Signed)
 error

## 2023-11-11 NOTE — Telephone Encounter (Signed)
 Submitted PA under new [employer] plan, sent as urgent.

## 2023-11-12 ENCOUNTER — Other Ambulatory Visit: Payer: Self-pay

## 2023-11-15 ENCOUNTER — Other Ambulatory Visit: Payer: Self-pay

## 2023-11-15 ENCOUNTER — Other Ambulatory Visit (HOSPITAL_COMMUNITY): Payer: Self-pay

## 2023-11-17 ENCOUNTER — Ambulatory Visit: Payer: 59 | Admitting: Gastroenterology

## 2023-11-18 ENCOUNTER — Ambulatory Visit: Payer: Commercial Managed Care - PPO | Admitting: Neurology

## 2023-11-18 DIAGNOSIS — G43711 Chronic migraine without aura, intractable, with status migrainosus: Secondary | ICD-10-CM

## 2023-11-18 MED ORDER — ONABOTULINUMTOXINA 200 UNITS IJ SOLR
155.0000 [IU] | Freq: Once | INTRAMUSCULAR | Status: AC
  Administered 2023-11-18: 155 [IU] via INTRAMUSCULAR

## 2023-11-18 NOTE — Addendum Note (Signed)
 Addended by: Bertram Savin on: 11/18/2023 10:52 AM   Modules accepted: Orders

## 2023-11-18 NOTE — Progress Notes (Signed)
 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

## 2023-11-18 NOTE — Progress Notes (Signed)
 Consent Form Botulism Toxin Injection For Chronic Migraine 11/18/2023: still > 60% improvement severity and freq.  08/24/2022: stable 06/02/2023: acutely nurtec helps. Zofran  roo. aseline daily headaches and16 migraine days.Now only max 4-5 migraine days a month and > 50% decrease severity and increased treatability 12/14/2022: stable, doin ggreat 09/21/2022: 2 migraines a month, takes nurtec and ondansetron , doing great, gave a zavzpret  sample did not like   06/22/2022: stable 03/19/2022: stable, doing great 12/25/2021: stable  05/27/2021: Stable  Interval history 09/11/2021: Baseline daily headaches and16 migraine days. Since last botox  she has had only 2 total very mild migraines a month and acute management works, nurtec works with zofran , she had side effects to zembrace, also maxalt  works, takes with zofran . More than 25 free headache days a month, exceptional improvement!.  She is very happy. She is on adderall  now but not causing headache.   Orders: [facility] or [facility] for dry needling for cervical and lumbar/lumbosacral dry needling for myofascial disease in the past       Reviewed orally with patient, additionally signature is on file:  Botulism toxin has been approved by the Federal drug administration for treatment of chronic migraine. Botulism toxin does not cure chronic migraine and it may not be effective in some patients.  The administration of botulism toxin is accomplished by injecting a small amount of toxin into the muscles of the neck and head. Dosage must be titrated for each individual. Any benefits resulting from botulism toxin tend to wear off after 3 months with a repeat injection required if benefit is to be maintained. Injections are usually done every 3-4 months with maximum effect peak achieved by about 2 or 3 weeks. Botulism toxin is expensive and you should be sure of what costs you will incur resulting from the injection.  The side effects of  botulism toxin use for chronic migraine may include:   -Transient, and usually mild, facial weakness with facial injections  -Transient, and usually mild, head or neck weakness with head/neck injections  -Reduction or loss of forehead facial animation due to forehead muscle weakness  -Eyelid drooping  -Dry eye  -Pain at the site of injection or bruising at the site of injection  -Double vision  -Potential unknown long term risks  Contraindications: You should not have Botox  if you are pregnant, nursing, allergic to albumin, have an infection, skin condition, or muscle weakness at the site of the injection, or have myasthenia gravis, Lambert-Eaton syndrome, or ALS.  It is also possible that as with any injection, there may be an allergic reaction or no effect from the medication. Reduced effectiveness after repeated injections is sometimes seen and rarely infection at the injection site may occur. All care will be taken to prevent these side effects. If therapy is given over a long time, atrophy and wasting in the muscle injected may occur. Occasionally the patient's become refractory to treatment because they develop antibodies to the toxin. In this event, therapy needs to be modified.  I have read the above information and consent to the administration of botulism toxin.    BOTOX  PROCEDURE NOTE FOR MIGRAINE HEADACHE    Contraindications and precautions discussed with patient(above). Aseptic procedure was observed and patient tolerated procedure. Procedure performed by Dr. Andree Epp  The condition has existed for more than 6 months, and pt does not have a diagnosis of ALS, Myasthenia Gravis or Lambert-Eaton Syndrome.  Risks and benefits of injections discussed and pt agrees to proceed with the  procedure.  Written consent obtained  These injections are medically necessary. Pt  receives good benefits from these injections. These injections do not cause sedations or hallucinations which the  oral therapies may cause.  Description of procedure:  The patient was placed in a sitting position. The standard protocol was used for Botox  as follows, with 5 units of Botox  injected at each site:   -Procerus muscle, midline injection  -Corrugator muscle, bilateral injection  -Frontalis muscle, bilateral injection, with 2 sites each side, medial injection was performed in the upper one third of the frontalis muscle, in the region vertical from the medial inferior edge of the superior orbital rim. The lateral injection was again in the upper one third of the forehead vertically above the lateral limbus of the cornea, 1.5 cm lateral to the medial injection site.  -Temporalis muscle injection, 5 sites, bilaterally. The first injection was 3 cm above the tragus of the ear, second injection site was 1.5 cm to 3 cm up from the first injection site in line with the tragus of the ear. The third injection site was 1.5-3 cm forward between the first 2 injection sites. The fourth injection site was 1.5 cm posterior to the second injection site. 5th site laterally in the temporalis  muscleat the level of the outer canthus.  - Patient feels her clenching is a trigger for headaches. +5 units masseter bilaterally   - Patient feels the migraines are centered around the eyes +5 units bilaterally at the outer canthus in the orbicularis occuli  -Occipitalis muscle injection, 3 sites, bilaterally. The first injection was done one half way between the occipital protuberance and the tip of the mastoid process behind the ear. The second injection site was done lateral and superior to the first, 1 fingerbreadth from the first injection. The third injection site was 1 fingerbreadth superiorly and medially from the first injection site.  -Cervical paraspinal muscle injection, 2 sites, bilateral knee first injection site was 1 cm from the midline of the cervical spine, 3 cm inferior to the lower border of the occipital  protuberance. The second injection site was 1.5 cm superiorly and laterally to the first injection site.  -Trapezius muscle injection was performed at 3 sites, bilaterally. The first injection site was in the upper trapezius muscle halfway between the inflection point of the neck, and the acromion. The second injection site was one half way between the acromion and the first injection site. The third injection was done between the first injection site and the inflection point of the neck.   Will return for repeat injection in 3 months.   A 200 unit sof Botox  was used, 45 U Botox  not injected was wasted. The patient tolerated the procedure well, there were no complications of the above procedure.

## 2023-11-19 ENCOUNTER — Encounter (HOSPITAL_BASED_OUTPATIENT_CLINIC_OR_DEPARTMENT_OTHER): Payer: Self-pay | Admitting: Radiology

## 2023-11-19 ENCOUNTER — Ambulatory Visit (HOSPITAL_BASED_OUTPATIENT_CLINIC_OR_DEPARTMENT_OTHER)
Admission: RE | Admit: 2023-11-19 | Discharge: 2023-11-19 | Disposition: A | Discharge status: Home / Self Care | Payer: Commercial Managed Care - PPO | Source: Ambulatory Visit | Attending: Physician Assistant | Admitting: Physician Assistant

## 2023-11-19 ENCOUNTER — Other Ambulatory Visit (HOSPITAL_BASED_OUTPATIENT_CLINIC_OR_DEPARTMENT_OTHER): Payer: Self-pay

## 2023-11-19 DIAGNOSIS — Z1231 Encounter for screening mammogram for malignant neoplasm of breast: Secondary | ICD-10-CM | POA: Diagnosis not present

## 2023-12-08 ENCOUNTER — Ambulatory Visit: Payer: Commercial Managed Care - PPO | Admitting: Family

## 2023-12-08 ENCOUNTER — Other Ambulatory Visit: Payer: Self-pay

## 2023-12-08 ENCOUNTER — Other Ambulatory Visit (HOSPITAL_BASED_OUTPATIENT_CLINIC_OR_DEPARTMENT_OTHER): Payer: Self-pay

## 2023-12-08 VITALS — BP 119/78 | HR 83 | Temp 98.8°F | Resp 16 | Ht 64.0 in | Wt 172.0 lb

## 2023-12-08 DIAGNOSIS — D259 Leiomyoma of uterus, unspecified: Secondary | ICD-10-CM

## 2023-12-08 DIAGNOSIS — F900 Attention-deficit hyperactivity disorder, predominantly inattentive type: Secondary | ICD-10-CM

## 2023-12-08 DIAGNOSIS — Z01411 Encounter for gynecological examination (general) (routine) with abnormal findings: Secondary | ICD-10-CM | POA: Diagnosis not present

## 2023-12-08 DIAGNOSIS — E663 Overweight: Secondary | ICD-10-CM

## 2023-12-08 DIAGNOSIS — E559 Vitamin D deficiency, unspecified: Secondary | ICD-10-CM | POA: Diagnosis not present

## 2023-12-08 DIAGNOSIS — J029 Acute pharyngitis, unspecified: Secondary | ICD-10-CM

## 2023-12-08 DIAGNOSIS — D649 Anemia, unspecified: Secondary | ICD-10-CM

## 2023-12-08 DIAGNOSIS — Z113 Encounter for screening for infections with a predominantly sexual mode of transmission: Secondary | ICD-10-CM | POA: Diagnosis not present

## 2023-12-08 DIAGNOSIS — Z124 Encounter for screening for malignant neoplasm of cervix: Secondary | ICD-10-CM | POA: Diagnosis not present

## 2023-12-08 DIAGNOSIS — Z1331 Encounter for screening for depression: Secondary | ICD-10-CM | POA: Diagnosis not present

## 2023-12-08 DIAGNOSIS — J101 Influenza due to other identified influenza virus with other respiratory manifestations: Secondary | ICD-10-CM

## 2023-12-08 DIAGNOSIS — G43109 Migraine with aura, not intractable, without status migrainosus: Secondary | ICD-10-CM

## 2023-12-08 DIAGNOSIS — F32A Depression, unspecified: Secondary | ICD-10-CM

## 2023-12-08 DIAGNOSIS — F419 Anxiety disorder, unspecified: Secondary | ICD-10-CM

## 2023-12-08 DIAGNOSIS — H6692 Otitis media, unspecified, left ear: Secondary | ICD-10-CM

## 2023-12-08 DIAGNOSIS — Z01419 Encounter for gynecological examination (general) (routine) without abnormal findings: Secondary | ICD-10-CM | POA: Diagnosis not present

## 2023-12-08 DIAGNOSIS — I10 Essential (primary) hypertension: Secondary | ICD-10-CM | POA: Diagnosis not present

## 2023-12-08 DIAGNOSIS — K219 Gastro-esophageal reflux disease without esophagitis: Secondary | ICD-10-CM

## 2023-12-08 LAB — BASIC METABOLIC PANEL
BUN: 15 mg/dL (ref 6–23)
CO2: 26 meq/L (ref 19–32)
Calcium: 9 mg/dL (ref 8.4–10.5)
Chloride: 100 meq/L (ref 96–112)
Creatinine, Ser: 0.85 mg/dL (ref 0.40–1.20)
GFR: 85.26 mL/min (ref >60.00)
Glucose, Bld: 99 mg/dL (ref 70–99)
Potassium: 3.8 meq/L (ref 3.5–5.1)
Sodium: 134 meq/L — ABNORMAL LOW (ref 135–145)

## 2023-12-08 LAB — CBC WITH DIFFERENTIAL/PLATELET
Basophils Absolute: 0 10*3/uL (ref 0.0–0.1)
Basophils Relative: 0.7 % (ref 0.0–3.0)
Eosinophils Absolute: 0 10*3/uL (ref 0.0–0.7)
Eosinophils Relative: 0.2 % (ref 0.0–5.0)
HCT: 45.3 % (ref 36.0–46.0)
Hemoglobin: 14.7 g/dL (ref 12.0–15.0)
Lymphocytes Relative: 9.4 % — ABNORMAL LOW (ref 12.0–46.0)
Lymphs Abs: 0.4 10*3/uL — ABNORMAL LOW (ref 0.7–4.0)
MCHC: 32.5 g/dL (ref 30.0–36.0)
MCV: 86.1 fL (ref 78.0–100.0)
Monocytes Absolute: 0.6 10*3/uL (ref 0.1–1.0)
Monocytes Relative: 14.9 % — ABNORMAL HIGH (ref 3.0–12.0)
Neutro Abs: 3.1 10*3/uL (ref 1.4–7.7)
Neutrophils Relative %: 74.8 % (ref 43.0–77.0)
Platelets: 228 10*3/uL (ref 150.0–400.0)
RBC: 5.26 Mil/uL — ABNORMAL HIGH (ref 3.87–5.11)
RDW: 18.6 % — ABNORMAL HIGH (ref 11.5–15.5)
WBC: 4.2 10*3/uL (ref 4.0–10.5)

## 2023-12-08 LAB — POC COVID19 BINAXNOW: SARS Coronavirus 2 Ag: NEGATIVE

## 2023-12-08 LAB — IBC + FERRITIN
Ferritin: 19.3 ng/mL (ref 10.0–291.0)
Iron: 46 ug/dL (ref 42–145)
Saturation Ratios: 11.3 % — ABNORMAL LOW (ref 20.0–50.0)
TIBC: 406 ug/dL (ref 250.0–450.0)
Transferrin: 290 mg/dL (ref 212.0–360.0)

## 2023-12-08 LAB — VITAMIN D 25 HYDROXY (VIT D DEFICIENCY, FRACTURES): VITD: 25.03 ng/mL — ABNORMAL LOW (ref 30.00–100.00)

## 2023-12-08 LAB — POCT INFLUENZA A/B
Influenza A, POC: POSITIVE — AB
Influenza B, POC: NEGATIVE

## 2023-12-08 MED ORDER — LISDEXAMFETAMINE DIMESYLATE 50 MG PO CAPS
50.0000 mg | ORAL_CAPSULE | Freq: Every day | ORAL | 0 refills | Status: DC
  Filled 2023-12-08 – 2024-01-05 (×7): qty 30, 30d supply, fill #0

## 2023-12-08 MED ORDER — OSELTAMIVIR PHOSPHATE 75 MG PO CAPS
75.0000 mg | ORAL_CAPSULE | Freq: Two times a day (BID) | ORAL | 0 refills | Status: AC
  Filled 2023-12-08: qty 10, 5d supply, fill #0

## 2023-12-08 MED ORDER — METOPROLOL SUCCINATE ER 25 MG PO TB24
25.0000 mg | ORAL_TABLET | Freq: Every day | ORAL | 1 refills | Status: DC
  Filled 2023-12-08: qty 90, 90d supply, fill #0

## 2023-12-08 MED ORDER — AMOXICILLIN 500 MG PO CAPS
500.0000 mg | ORAL_CAPSULE | Freq: Three times a day (TID) | ORAL | 0 refills | Status: AC
  Filled 2023-12-08: qty 30, 10d supply, fill #0

## 2023-12-08 NOTE — Assessment & Plan Note (Signed)
She is maintained on Vyvanse 50mg .  Fair control, she plans to try to add in more brakes.  Contract updated today.

## 2023-12-08 NOTE — Assessment & Plan Note (Signed)
Stable without medication.

## 2023-12-08 NOTE — Assessment & Plan Note (Signed)
New.  Rx with Tamiflu.  Covid testing negative. Supportive measures recommended: motrin prn, fluids, rest.  Follow up if symptoms worsen or if symptoms fail to improve in the next 3-4 days.

## 2023-12-08 NOTE — Assessment & Plan Note (Addendum)
Follows with neurology- continues botox injections and nurtec.

## 2023-12-08 NOTE — Patient Instructions (Signed)
VISIT SUMMARY:  During today's visit, we discussed your sore throat, fatigue, and cough that began yesterday. We also reviewed your ongoing management for ADHD, hypertension, GERD, weight management, anemia, and vitamin D deficiency. Additionally, we talked about your recent fibroid embolization and migraine treatment.  YOUR PLAN:   Ear infection/Influenza A- we will treat you with tamiflu as well as amoxicillin. Please continue supportive measures (motrin, rest, fluids) and let us know if you do not improve in the next 3-4 days.  -ADHD: Attention Deficit Hyperactivity Disorder (ADHD) is a condition characterized by inattention, hyperactivity, and impulsiveness. You will continue taking Vyvanse 50mg  daily. We discussed incorporating activity breaks to help with distractibility and will consider adjusting the dose at your next visit if symptoms persist.  -HYPERTENSION: Hypertension, or high blood pressure, is a condition where the force of the blood against your artery walls is too high. Your blood pressure is well controlled with Metoprolol XL 25mg  and Amlodipine, so you should continue with your current regimen.  -GERD: Gastroesophageal Reflux Disease (GERD) is a chronic condition where stomach acid frequently flows back into the tube connecting your mouth and stomach. You will continue taking Prilosec as prescribed by your GI specialist, and you have a follow-up appointment scheduled.  -WEIGHT MANAGEMENT: You are taking Contrave to help manage your weight, and it has been somewhat effective in curbing your appetite. Continue taking Contrave as prescribed to maintain your current weight.  -ANEMIA: Anemia is a condition where you lack enough healthy red blood cells to carry adequate oxygen to your body's tissues. We will order lab work to check your current iron levels since your last check in July showed low levels.  -VITAMIN D DEFICIENCY: Vitamin D deficiency occurs when you don't have enough  vitamin D in your body, which is essential for bone health. We will order lab work to check your current vitamin D levels.  -GENERAL HEALTH MAINTENANCE: We will update your urine drug screen for your ADHD medication contract. Please follow up with your GYN regarding your recent fibroid embolization and continue with Botox for migraine management.

## 2023-12-08 NOTE — Progress Notes (Signed)
v  Subjective:     Patient ID: Kaitlin Foster, female    DOB: 1981-02-22, 43 y.o.   MRN: [id number]  Chief Complaint  Patient presents with   ADHD    Here for follow up   Hypertension    Here for follow up   Sore Throat    Patient complains of sore throat that started yesterday   Generalized Body Aches   Cough    HPI  Discussed the use of AI scribe software for clinical note transcription with the patient, who gave verbal consent to proceed.  History of Present Illness   The patient presents with sore throat, fatigue, and cough. Symptoms began yesterday AM.   For ADHD, they are taking Vyvanse 50 mg once daily, which is somewhat effective, though distraction persists.  They have a history of fibroid embolization performed on December 17th for heavy menstrual bleeding. One lighter period has occurred since, though it remains somewhat heavy. Full improvement is expected within three months.  They experience migraines and receive Botox treatment. A slight increase in frequency has been noted, but Nurtec effectively alleviates symptoms within a few hours.  Blood pressure is well-controlled with amlodipine and metoprolol. They are also taking Prilosec as prescribed by their GI specialist, with a follow-up appointment scheduled.  They have resumed Contrave three weeks ago after a pause due to medical procedures, taking two in the morning and one at night. It slightly curbs appetite, and they are focused on maintaining their current weight.   Wt Readings from Last 3 Encounters:  12/08/23 172 lb (78 kg)  09/08/23 161 lb (73 kg)  08/19/23 161 lb (73 kg)            There are no preventive care reminders to display for this patient.  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;   2 stage embolization ACA supply to AVM @ [facility]  Sept 11th and 18th 2008 then craniotomy resection AVM 07-28-2007   Hypertension    Left ureteral stone    Migraines    Nephrolithiasis    left renal nonobstructive stone per CT 06-14-2019   Uterine fibroid     Past Surgical History:  Procedure Laterality Date   CEREBRAL EMBOLIZATION  Sept 11th and 18th, 2008   @[facility]    2 staged embolization left ACA supply to AVM   CESAREAN SECTION  07/06/2009   CRANIOTOMY  07-29-2007   @ [facility]   resection left frontal AVM   IR EMBO TUMOR ORGAN ISCHEMIA INFARCT INC GUIDE ROADMAPPING  10/26/2023   IR FLUORO GUIDED NEEDLE PLC ASPIRATION/INJECTION LOC  10/26/2023   IR RADIOLOGIST EVAL & MGMT  10/12/2023    Family History  Problem Relation Age of Onset   Hypertension Mother    Arthritis Mother    Migraines Mother    Obesity Mother    Cancer Mother        sinus tumor   Hypertension Father    Alcohol abuse Father    Arthritis Father    Obesity  Father    Migraines Sister    Ulcerative colitis Sister    Migraines Maternal Aunt    Colon cancer Maternal Grandmother 55   Cancer Maternal Grandmother        colon diagnosed at age 55   Dementia Maternal Grandmother    ADD / ADHD Son    Heart disease Neg Hx    Stomach cancer Neg Hx    Esophageal cancer Neg Hx     Social History   Socioeconomic History   Marital status: Married    Spouse name: Thad   Number of children: 1   Years of education: Not on file   Highest education level: Associate degree: academic program  Occupational History   Occupation: Charity fundraiser   Occupation: Engineer, civil (consulting)  Tobacco Use   Smoking status: Never   Smokeless tobacco: Never  Vaping Use   Vaping status: Never Used  Substance and Sexual Activity    Alcohol use: No    Alcohol/week: 0.0 standard drinks of alcohol   Drug use: No   Sexual activity: Yes    Birth control/protection: Condom  Other Topics Concern   Not on file  Social History Narrative   Regular exercise: no regular exercise   Caffeine use:  1-2 coffee daily   Son born 2010   Works as a Engineer, 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)   Married   Lives at home with her husband and child      Social Drivers of Corporate investment banker Strain: Low Risk  (06/06/2023)   Overall Financial Resource Strain (CARDIA)    Difficulty of Paying Living Expenses: Not hard at all  Food Insecurity: No Food Insecurity (06/06/2023)   Hunger Vital Sign    Worried About Running Out of Food in the Last Year: Never true    Ran Out of Food in the Last Year: Never true  Transportation Needs: No Transportation Needs (06/06/2023)   PRAPARE - Administrator, Civil Service (Medical): No    Lack of Transportation (Non-Medical): No  Physical Activity: Not on file  Stress: Not on file  Social Connections: Socially Integrated (06/06/2023)   Social Connection and Isolation Panel [NHANES]    Frequency of Communication with Friends and Family: More than three times a week    Frequency of Social Gatherings with Friends and Family: Twice a week    Attends Religious Services: More than 4 times per year    Active Member of [employer] or Organizations: Yes    Attends Engineer, structural: More than 4 times per year    Marital Status: Married  Catering manager Violence: Not on file    Outpatient Medications Prior to Visit  Medication Sig Dispense Refill   amLODipine (NORVASC) 5 MG tablet Take 1 tablet (5 mg total) by mouth daily. 90 tablet 1   botulinum toxin Type A (BOTOX) 200 units injection Provider to inject 155 units into the muscles of the head and neck every 12 weeks. Discard remainder. 1 each 3   Cholecalciferol (VITAMIN D3) 75 MCG (3000 UT) TABS Take 1 tablet by mouth daily. 30 tablet     docusate sodium (COLACE) 50 MG capsule Take 50 mg by mouth 2 (two) times daily.     famotidine (PEPCID) 20 MG tablet Take 20 mg by mouth daily as needed for heartburn or indigestion.      Ferrous Sulfate (IRON) 325 (65 Fe) MG TABS Take 1 tablet (325 mg total) by mouth daily. 30 tablet  0   fluticasone (FLONASE) 50 MCG/ACT nasal spray Place 2 sprays into both nostrils daily. 16 g 0   levocetirizine (XYZAL) 5 MG tablet Take 1 tablet (5 mg total) by mouth every evening. 90 tablet 4   Naltrexone-buPROPion HCl ER (CONTRAVE) 8-90 MG TB12 Take 1 tablet twice daily for 1 week; then 2 tablets in the morning and 1 tablet in the evening for 1 week; and then 2 tablets twice daily. 360 tablet 0   omeprazole (PRILOSEC) 40 MG capsule Take 1 capsule (40 mg total) by mouth daily. 30 capsule 1   ondansetron (ZOFRAN) 8 MG tablet Take 1 tablet (8 mg total) by mouth every 8 (eight) hours as needed for nausea or vomiting. 30 tablet 0   ondansetron (ZOFRAN-ODT) 4 MG disintegrating tablet Dissolve 1-2 tablets (4-8 mg total) by mouth every 8 (eight) hours as needed. 30 tablet 3   promethazine (PHENERGAN) 12.5 MG tablet Take 1 tablet (12.5 mg total) by mouth every 4 (four) hours as needed for nausea or vomiting. 30 tablet 0   rizatriptan (MAXALT-MLT) 10 MG disintegrating tablet Take 1 tablet (10 mg total) by mouth as needed for migraine. May repeat in 2 hours if needed 9 tablet 11   lisdexamfetamine (VYVANSE) 50 MG capsule Take 1 capsule (50 mg total) by mouth daily. 30 capsule 0   metoprolol succinate (TOPROL-XL) 50 MG 24 hr tablet Take 0.5 tablets (25 mg total) by mouth daily. Take with or immediately following a meal. 45 tablet 1   No facility-administered medications prior to visit.    Allergies  Allergen Reactions   Sulfa Antibiotics Hives   Lisinopril Other (See Comments)    cough    ROS    See HPI Objective:    Physical Exam Constitutional:      General: She is not in acute distress.    Appearance:  Normal appearance. She is well-developed.  HENT:     Head: Normocephalic and atraumatic.     Right Ear: Tympanic membrane and external ear normal.     Left Ear: External ear normal. Tympanic membrane is erythematous.     Mouth/Throat:     Mouth: Mucous membranes are moist.     Pharynx: Posterior oropharyngeal erythema present. No pharyngeal swelling, oropharyngeal exudate or uvula swelling.     Tonsils: 1+ on the right. 1+ on the left.  Eyes:     General: No scleral icterus. Neck:     Thyroid: No thyromegaly.  Cardiovascular:     Rate and Rhythm: Normal rate and regular rhythm.     Heart sounds: Normal heart sounds. No murmur heard. Pulmonary:     Effort: Pulmonary effort is normal. No respiratory distress.     Breath sounds: Normal breath sounds. No wheezing.  Musculoskeletal:     Cervical back: Neck supple.  Skin:    General: Skin is warm and dry.  Neurological:     Mental Status: She is alert and oriented to person, place, and time.  Psychiatric:        Mood and Affect: Mood normal.        Behavior: Behavior normal.        Thought Content: Thought content normal.        Judgment: Judgment normal.      BP 119/78 (BP Location: Right Arm, Patient Position: Sitting, Cuff Size: Normal)   Pulse 83   Temp 98.8 F (37.1 C) (Oral)   Resp 16   Ht 5\' 4"  (1.626 m)  Wt 172 lb (78 kg)   SpO2 96%   BMI 29.52 kg/m  Wt Readings from Last 3 Encounters:  12/08/23 172 lb (78 kg)  09/08/23 161 lb (73 kg)  08/19/23 161 lb (73 kg)       Assessment & Plan:   Problem List Items Addressed This Visit       Unprioritized   Vitamin D deficiency   Relevant Orders   VITAMIN D 25 Hydroxy (Vit-D Deficiency, Fractures)   Uterine fibroid   S/p embolization in December.       Overweight   Tolerting contrave, just restarted.       Migraines   Follows with neurology- continues botox injections and nurtec.        Relevant Medications   metoprolol succinate (TOPROL-XL) 25 MG 24  hr tablet   Influenza A   New.  Rx with Tamiflu.  Covid testing negative. Supportive measures recommended: motrin prn, fluids, rest.  Follow up if symptoms worsen or if symptoms fail to improve in the next 3-4 days.      Relevant Medications   oseltamivir (TAMIFLU) 75 MG capsule   Hypertension   At goal on toprol xl 25mg  and amlodipine 5mg . Continue same.       Relevant Medications   metoprolol succinate (TOPROL-XL) 25 MG 24 hr tablet   Other Relevant Orders   Basic Metabolic Panel (BMET)   GERD (gastroesophageal reflux disease)   Maintained on omeprazole per GI- has follow up this Friday.       Anxiety and depression   Stable without medication.       Anemia   Relevant Orders   CBC w/Diff   IBC + Ferritin   ADHD, predominantly inattentive type - Primary   She is maintained on Vyvanse 50mg .  Fair control, she plans to try to add in more brakes.  Contract updated today.        Relevant Medications   lisdexamfetamine (VYVANSE) 50 MG capsule   Other Relevant Orders   DRUG MONITORING, PANEL 8 WITH CONFIRMATION, URINE   Other Visit Diagnoses       Left otitis media, unspecified otitis media type       Relevant Medications   amoxicillin (AMOXIL) 500 MG capsule   oseltamivir (TAMIFLU) 75 MG capsule     Sore throat       Relevant Orders   POC COVID-19 (Completed)   POCT Influenza A/B (Completed)       I have discontinued Jesse H. Cowles's metoprolol succinate. I am also having her start on metoprolol succinate, amoxicillin, and oseltamivir. Additionally, I am having her maintain her rizatriptan, famotidine, Iron, Vitamin D3, docusate sodium, fluticasone, levocetirizine, Contrave, omeprazole, promethazine, ondansetron, amLODipine, ondansetron, Botox, and lisdexamfetamine.  Meds ordered this encounter  Medications   metoprolol succinate (TOPROL-XL) 25 MG 24 hr tablet    Sig: Take 1 tablet (25 mg total) by mouth daily.    Dispense:  90 tablet    Refill:  1     Supervising Provider:   Danise Edge A [[id number]]   lisdexamfetamine (VYVANSE) 50 MG capsule    Sig: Take 1 capsule (50 mg total) by mouth daily.    Dispense:  30 capsule    Refill:  0    Supervising Provider:   Danise Edge A [[id number]]   amoxicillin (AMOXIL) 500 MG capsule    Sig: Take 1 capsule (500 mg total) by mouth 3 (three) times daily for 10 days.    Dispense:  30  capsule    Refill:  0    Supervising Provider:   Danise Edge A [[id number]]   oseltamivir (TAMIFLU) 75 MG capsule    Sig: Take 1 capsule (75 mg total) by mouth 2 (two) times daily for 5 days.    Dispense:  10 capsule    Refill:  0

## 2023-12-08 NOTE — Assessment & Plan Note (Signed)
S/p embolization in December.

## 2023-12-08 NOTE — Assessment & Plan Note (Signed)
At goal on toprol xl 25mg  and amlodipine 5mg . Continue same.

## 2023-12-08 NOTE — Assessment & Plan Note (Signed)
Tolerting contrave, just restarted.

## 2023-12-08 NOTE — Assessment & Plan Note (Signed)
Maintained on omeprazole per GI- has follow up this Friday.

## 2023-12-09 ENCOUNTER — Telehealth: Payer: Self-pay | Admitting: Family

## 2023-12-09 ENCOUNTER — Other Ambulatory Visit: Payer: Self-pay

## 2023-12-09 NOTE — Telephone Encounter (Signed)
See mychart.

## 2023-12-10 ENCOUNTER — Ambulatory Visit: Payer: Commercial Managed Care - PPO | Admitting: Internal Medicine

## 2023-12-10 ENCOUNTER — Encounter: Payer: Self-pay | Admitting: Internal Medicine

## 2023-12-10 ENCOUNTER — Other Ambulatory Visit (INDEPENDENT_AMBULATORY_CARE_PROVIDER_SITE_OTHER): Payer: Commercial Managed Care - PPO

## 2023-12-10 ENCOUNTER — Ambulatory Visit: Payer: 59 | Admitting: Internal Medicine

## 2023-12-10 ENCOUNTER — Other Ambulatory Visit: Payer: Commercial Managed Care - PPO

## 2023-12-10 VITALS — BP 116/80 | HR 79 | Ht 64.0 in | Wt 172.0 lb

## 2023-12-10 DIAGNOSIS — K529 Noninfective gastroenteritis and colitis, unspecified: Secondary | ICD-10-CM | POA: Diagnosis not present

## 2023-12-10 DIAGNOSIS — R110 Nausea: Secondary | ICD-10-CM

## 2023-12-10 DIAGNOSIS — Z8379 Family history of other diseases of the digestive system: Secondary | ICD-10-CM | POA: Diagnosis not present

## 2023-12-10 LAB — HIGH SENSITIVITY CRP: CRP, High Sensitivity: 13.92 mg/L — ABNORMAL HIGH (ref 0.000–5.000)

## 2023-12-10 LAB — DRUG MONITORING, PANEL 8 WITH CONFIRMATION, URINE
6 Acetylmorphine: NEGATIVE ng/mL (ref <10)
Alcohol Metabolites: NEGATIVE ng/mL (ref <500)
Amphetamine: 8488 ng/mL — ABNORMAL HIGH (ref <250)
Amphetamines: POSITIVE ng/mL — AB (ref <500)
Benzodiazepines: NEGATIVE ng/mL (ref <100)
Buprenorphine, Urine: NEGATIVE ng/mL (ref <5)
Cocaine Metabolite: NEGATIVE ng/mL (ref <150)
Creatinine: >300 mg/dL (ref >=20.0)
MDMA: NEGATIVE ng/mL (ref <500)
Marijuana Metabolite: NEGATIVE ng/mL (ref <20)
Methamphetamine: NEGATIVE ng/mL (ref <250)
Opiates: NEGATIVE ng/mL (ref <100)
Oxidant: NEGATIVE ug/mL (ref <200)
Oxycodone: NEGATIVE ng/mL (ref <100)
pH: 6 (ref 4.5–9.0)

## 2023-12-10 LAB — DM TEMPLATE

## 2023-12-10 NOTE — Progress Notes (Unsigned)
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  Patient's husband is known to Dr. Leonides Schanz and she is requesting Dr. Leonides Schanz be her gastroenterologist as well . She has a history of ADHD, migraines, menorrhagia, uterine fibroids, iron deficiency anemia , GERD and frontal brain AVM status postresection in 2008.   Positive IFOBT / occasional rectal bleeding with BMs.  Bleeding likely hemorrhoidal but need to rule out colon polyps / neoplasm.   Iron deficiency anemia ( recurrent per patient) .  Most likely 2/2 to menorrhagia related to uterine fibroids and also recent discontinuation of chronic oral iron   Intermittent nausea.  Patient feels nausea is multifactorial (oral iron, migraines, and ? Due to Contrave). Takes Zofran as needed  History of acid reflux, resolved with weight loss.  No longer requiring GERD medication  FMH of ulcerative colitis in sister  See PMH for any additional medical history  PLAN   -- Check CRP -- Check fecal calprotectin  -- Okay to stop omeprazole. Use Pepcid 20 mg BID PRN if patient has worsening of nausea after stopping her PPI. -- Repeat colonoscopy in 1 year   HPI   Chief complaint : positive IFOBT  Kaitlin Foster gives a prior history of iron deficiency anemia related.  She has heavy menstrual bleeding secondary to uterine fibroids.  She has been on iron for years but recently discontinued it for a couple of months.  Her hemoglobin subsequently fell into the 10 range.  Iron studies compatible with iron deficiency.  The iron causes her to be nauseated sometimes.  She also thinks that nausea is coming from [facility] which she is taking for weight loss. Takes Zofran as needed.  No regular use of NSAIDs.  She occasionally has painless rectal bleeding with bowel movements.  No abdominal pain.  Her sister has ulcerative colitis.  Her grandmother had some kind of a colon mass found when she was in her 11s but apparently  workup was not pursued.   Interval History: She is more on the constipated side. She uses ibuprofen and Aleve in spurts for migraines. She has not taken any since her colonoscopy procedure. Denies rectal bleeding currently.  Denies abdominal pain. She does have back and knee pain. Denies vision changes, ulcers in mouth/nose, and rashes. Endorses some nausea on occasion for which she takes Zofran. Omeprazole may have helped with nausea.    GI History / Studies    EGD 09/08/23: - Nodule found in the esophagus. Biopsied. - 2 cm hiatal hernia. - Erosive gastropathy with stigmata of recent bleeding. Biopsied. - Normal examined duodenum. Biopsied. Path: 1. Surgical [P], colon, duodenal :      - DUODENAL MUCOSA WITH NO SPECIFIC HISTOPATHOLOGIC CHANGES      - NEGATIVE FOR INCREASED INTRAEPITHELIAL LYMPHOCYTES OR VILLOUS ARCHITECTURAL CHANGES      2. Surgical [P], gastric erosions :      - GASTRIC ANTRAL AND OXYNTIC MUCOSA WITH NO SPECIFIC HISTOPATHOLOGIC CHANGES      - HELICOBACTER PYLORI-LIKE ORGANISMS ARE NOT IDENTIFIED ON ROUTINE H&E STAIN      3. Surgical [P], esophageal nodule :      - ESOPHAGEAL SQUAMOUS AND CARDIAC MUCOSA WITH NONSPECIFIC HYPERPLASTIC AND      INFLAMMATORY CHANGES      - NEGATIVE FOR INTESTINAL METAPLASIA OR DYSPLASIA  Colonoscopy 09/08/23: - A few erosions in the terminal ileum. Biopsied. - Erythematous mucosa in the rectum. Biopsied. -  Non- bleeding internal hemorrhoids.      4. Surgical [P], small bowel, ileocecal erosions :      - SEVERELY ACTIVE CHRONIC ILEITIS WITH ULCERATION, SEE NOTE      - NEGATIVE FOR GRANULOMAS OR DYSPLASIA      5. Surgical [P], colon, rectum :      - COLONIC MUCOSA WITH NO SPECIFIC HISTOPATHOLOGIC CHANGES      - NEGATIVE FOR ACUTE INFLAMMATION, FEATURES OF CHRONICITY, GRANULOMAS OR      DYSPLASIA      Diagnosis Note : Differential diagnosis includes localized inflammatory bowel      disease, chronic infection and drug-effect.Clinical  correlation is suggested   Labs      Latest Ref Rng & Units 12/08/2023    9:59 AM 06/07/2023   11:48 AM 03/08/2023   10:27 AM  CBC  WBC 4.0 - 10.5 K/uL 4.2  5.8  4.8   Hemoglobin 12.0 - 15.0 g/dL 41.3  24.4  01.0   Hematocrit 36.0 - 46.0 % 45.3  34.7  39.1   Platelets 150.0 - 400.0 K/uL 228.0  321.0  311.0     Lab Results  Component Value Date   LIPASE 32 06/14/2019      Latest Ref Rng & Units 12/08/2023    9:59 AM 03/08/2023   10:27 AM 08/26/2022    9:22 AM  CMP  Glucose 70 - 99 mg/dL 99  82  96   BUN 6 - 23 mg/dL 15  13  15    Creatinine 0.40 - 1.20 mg/dL 2.72  5.36  6.44   Sodium 135 - 145 mEq/L 134  139  139   Potassium 3.5 - 5.1 mEq/L 3.8  4.1  4.0   Chloride 96 - 112 mEq/L 100  105  105   CO2 19 - 32 mEq/L 26  26  28    Calcium 8.4 - 10.5 mg/dL 9.0  9.2  9.2   Total Protein 6.0 - 8.3 g/dL  6.4    Total Bilirubin 0.2 - 1.2 mg/dL  0.3    Alkaline Phos 39 - 117 U/L  55    AST 0 - 37 U/L  16    ALT 0 - 35 U/L  9     FERRITIN is 3  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;   2 stage embolization ACA supply to AVM @ [facility]  Sept 11th and 18th 2008 then craniotomy resection AVM 07-28-2007   Hypertension    Left ureteral stone    Migraines    Nephrolithiasis    left renal nonobstructive stone per CT 06-14-2019   Uterine fibroid    Past Surgical History:  Procedure Laterality Date   CEREBRAL EMBOLIZATION  Sept 11th and 18th, 2008   @[facility]    2 staged embolization left ACA supply to AVM   CESAREAN SECTION  07/06/2009   CRANIOTOMY  07-29-2007    @ [facility]   resection left frontal AVM   IR EMBO TUMOR ORGAN ISCHEMIA INFARCT INC GUIDE ROADMAPPING  10/26/2023  IR FLUORO GUIDED NEEDLE PLC ASPIRATION/INJECTION LOC  10/26/2023   IR RADIOLOGIST EVAL & MGMT  10/12/2023   Family History  Problem Relation Age of Onset   Hypertension Mother    Arthritis Mother    Migraines Mother    Obesity Mother    Cancer Mother        sinus tumor   Hypertension Father    Alcohol abuse Father    Arthritis Father    Obesity Father    Migraines Sister    Ulcerative colitis Sister    Migraines Maternal Aunt    Colon cancer Maternal Grandmother 36   Cancer Maternal Grandmother        colon diagnosed at age 49   Dementia Maternal Grandmother    ADD / ADHD Son    Heart disease Neg Hx    Stomach cancer Neg Hx    Esophageal cancer Neg Hx    Social History   Tobacco Use   Smoking status: Never   Smokeless tobacco: Never  Vaping Use   Vaping status: Never Used  Substance Use Topics   Alcohol use: No    Alcohol/week: 0.0 standard drinks of alcohol   Drug use: No   Current Outpatient Medications  Medication Sig Dispense Refill   amLODipine (NORVASC) 5 MG tablet Take 1 tablet (5 mg total) by mouth daily. 90 tablet 1   botulinum toxin Type A (BOTOX) 200 units injection Provider to inject 155 units into the muscles of the head and neck every 12 weeks. Discard remainder. 1 each 3   Cholecalciferol (VITAMIN D3) 75 MCG (3000 UT) TABS Take 1 tablet by mouth daily. 30 tablet    docusate sodium (COLACE) 50 MG capsule Take 50 mg by mouth 2 (two) times daily.     Ferrous Sulfate (IRON) 325 (65 Fe) MG TABS Take 1 tablet (325 mg total) by mouth daily. 30 tablet 0   fluticasone (FLONASE) 50 MCG/ACT nasal spray Place 2 sprays into both nostrils daily. 16 g 0   levocetirizine (XYZAL) 5 MG tablet Take 1 tablet (5 mg total) by mouth every evening. 90 tablet 4   lisdexamfetamine (VYVANSE) 50 MG capsule Take 1 capsule (50 mg total) by mouth daily. 30 capsule 0    metoprolol succinate (TOPROL-XL) 50 MG 24 hr tablet Take 0.5 tablets (25 mg total) by mouth daily. Take with or immediately following a meal.     Naltrexone-buPROPion HCl ER (CONTRAVE) 8-90 MG TB12 Take 1 tablet twice daily for 1 week; then 2 tablets in the morning and 1 tablet in the evening for 1 week; and then 2 tablets twice daily. 360 tablet 0   ondansetron (ZOFRAN-ODT) 4 MG disintegrating tablet Dissolve 1-2 tablets (4-8 mg total) by mouth every 8 (eight) hours as needed. 30 tablet 3   Rimegepant Sulfate (NURTEC) 75 MG TBDP DISSOLVE 1 TABLET BY MOUTH DAILY AS NEEDED FOR MIGRAINES. TAKE AS CLOSE TO ONSET OF MIGRAINE AS POSSIBLE. ONE DAILY MAXIMUM. 16 tablet 11   rizatriptan (MAXALT-MLT) 10 MG disintegrating tablet Take 1 tablet (10 mg total) by mouth as needed for migraine. May repeat in 2 hours if needed 9 tablet 11   No current facility-administered medications for this visit.   Allergies  Allergen Reactions   Sulfa Antibiotics Hives   Lisinopril Other (See Comments)    cough    Review of Systems: Positive for back pain, headaches, sleeping problems.  All other systems reviewed and negative except where noted in HPI.   Wt  Readings from Last 3 Encounters:  12/10/23 172 lb (78 kg)  12/08/23 172 lb (78 kg)  09/08/23 161 lb (73 kg)    Physical Exam:  BP 116/80   Pulse 79   Ht 5\' 4"  (1.626 m)   Wt 172 lb (78 kg)   BMI 29.52 kg/m  Constitutional:  Pleasant, generally well appearing female in no acute distress. Psychiatric:  Normal mood and affect. Behavior is normal. EENT: Pupils normal.  Conjunctivae are normal. No scleral icterus. Neck supple.  Cardiovascular: Normal rate, regular rhythm.  Pulmonary/chest: Effort normal and breath sounds normal. No wheezing, rales or rhonchi. Abdominal: Soft, nondistended, nontender. Bowel sounds active throughout. There are no masses palpable. No hepatomegaly. Neurological: Alert and oriented to person place and time.  Imogene Burn, MD   12/10/2023, 10:54 AM  Cc:  Referring Provider Sandford Craze, NP

## 2023-12-10 NOTE — Patient Instructions (Addendum)
Your provider has requested that you go to the basement level for lab work before leaving today. Press "B" on the elevator. The lab is located at the first door on the left as you exit the elevator.  Stop taking omeprazole.   Please purchase the following medications over the counter and take as directed: pepcid 20 mg twice daily as needed.   _______________________________________________________  If your blood pressure at your visit was 140/90 or greater, please contact your primary care physician to follow up on this.  _______________________________________________________  If you are age 19 or older, your body mass index should be between 23-30. Your Body mass index is 29.52 kg/m. If this is out of the aforementioned range listed, please consider follow up with your Primary Care Provider.  If you are age 18 or younger, your body mass index should be between 19-25. Your Body mass index is 29.52 kg/m. If this is out of the aformentioned range listed, please consider follow up with your Primary Care Provider.   ________________________________________________________  The [facility] providers would like to encourage you to use [facility] to communicate with providers for non-urgent requests or questions.  Due to long hold times on the telephone, sending your provider a message by [facility] may be a faster and more efficient way to get a response.  Please allow 48 business hours for a response.  Please remember that this is for non-urgent requests.  _______________________________________________________

## 2023-12-13 ENCOUNTER — Other Ambulatory Visit (HOSPITAL_BASED_OUTPATIENT_CLINIC_OR_DEPARTMENT_OTHER): Payer: Self-pay

## 2023-12-15 ENCOUNTER — Encounter: Payer: Self-pay | Admitting: Internal Medicine

## 2023-12-15 LAB — CALPROTECTIN, FECAL: Calprotectin, Fecal: 119 ug/g (ref 0–120)

## 2023-12-20 ENCOUNTER — Other Ambulatory Visit (HOSPITAL_BASED_OUTPATIENT_CLINIC_OR_DEPARTMENT_OTHER): Payer: Self-pay

## 2024-01-05 ENCOUNTER — Other Ambulatory Visit (HOSPITAL_BASED_OUTPATIENT_CLINIC_OR_DEPARTMENT_OTHER): Payer: Self-pay

## 2024-01-05 ENCOUNTER — Other Ambulatory Visit (HOSPITAL_COMMUNITY): Payer: Self-pay

## 2024-01-12 ENCOUNTER — Telehealth: Admitting: Family Medicine

## 2024-01-12 ENCOUNTER — Other Ambulatory Visit: Payer: Self-pay

## 2024-01-12 ENCOUNTER — Other Ambulatory Visit (HOSPITAL_COMMUNITY): Payer: Self-pay

## 2024-01-12 DIAGNOSIS — R399 Unspecified symptoms and signs involving the genitourinary system: Secondary | ICD-10-CM | POA: Diagnosis not present

## 2024-01-13 ENCOUNTER — Encounter: Payer: Self-pay | Admitting: Pharmacist

## 2024-01-13 ENCOUNTER — Other Ambulatory Visit: Payer: Self-pay

## 2024-01-13 ENCOUNTER — Other Ambulatory Visit (HOSPITAL_BASED_OUTPATIENT_CLINIC_OR_DEPARTMENT_OTHER): Payer: Self-pay

## 2024-01-13 MED ORDER — CEPHALEXIN 500 MG PO CAPS
500.0000 mg | ORAL_CAPSULE | Freq: Two times a day (BID) | ORAL | 0 refills | Status: AC
  Filled 2024-01-13: qty 14, 7d supply, fill #0

## 2024-01-13 NOTE — Progress Notes (Signed)

## 2024-01-18 ENCOUNTER — Encounter: Payer: Self-pay | Admitting: Family

## 2024-01-18 ENCOUNTER — Ambulatory Visit: Admitting: Family

## 2024-01-18 VITALS — BP 122/84 | HR 86 | Temp 98.6°F | Ht 64.0 in | Wt 177.6 lb

## 2024-01-18 DIAGNOSIS — B349 Viral infection, unspecified: Secondary | ICD-10-CM | POA: Diagnosis not present

## 2024-01-18 DIAGNOSIS — R300 Dysuria: Secondary | ICD-10-CM

## 2024-01-18 DIAGNOSIS — G43809 Other migraine, not intractable, without status migrainosus: Secondary | ICD-10-CM

## 2024-01-18 NOTE — Progress Notes (Signed)
 Kaitlin Foster is a 43 y.o. 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 90 tablet 1   botulinum toxin Type A (BOTOX) 200 units injection Provider to inject 155 units into the muscles of the head and neck every 12 weeks. Discard remainder. 1 each 3   cephALEXin (KEFLEX) 500 MG capsule Take 1 capsule (500 mg total) by mouth 2 (two) times daily for 7 days. 14 capsule 0   Cholecalciferol (VITAMIN D3) 75 MCG (3000 UT) TABS Take 1 tablet by mouth daily. 30 tablet    docusate sodium (COLACE) 50 MG capsule Take 50 mg by mouth 2 (two) times daily.     famotidine (PEPCID) 20 MG tablet Take 20 mg by mouth daily as needed for heartburn or indigestion.      Ferrous Sulfate (IRON) 325 (65 Fe) MG TABS Take 1 tablet (325 mg total) by mouth daily. 30 tablet 0   fluticasone (FLONASE) 50 MCG/ACT nasal spray Place 2 sprays into both nostrils daily. 16 g 0   levocetirizine (XYZAL) 5 MG tablet Take 1 tablet (5 mg total) by mouth every evening. 90 tablet 4   lisdexamfetamine (VYVANSE) 50 MG capsule  Take 1 capsule (50 mg total) by mouth daily. 30 capsule 0   metoprolol succinate (TOPROL-XL) 25 MG 24 hr tablet Take 1 tablet (25 mg total) by mouth daily. 90 tablet 1   Naltrexone-buPROPion HCl ER (CONTRAVE) 8-90 MG TB12 Take 1 tablet twice daily for 1 week; then 2 tablets in the morning and 1 tablet in the evening for 1 week; and then 2 tablets twice daily. 360 tablet 0   omeprazole (PRILOSEC) 40 MG capsule Take 1 capsule (40 mg total) by mouth daily. 30 capsule 1   ondansetron (ZOFRAN) 8 MG tablet Take 1 tablet (8 mg total) by mouth every 8 (eight) hours as needed for nausea or vomiting. 30 tablet 0   ondansetron (ZOFRAN-ODT) 4 MG disintegrating tablet Dissolve 1-2 tablets (4-8 mg total) by mouth every 8 (eight) hours as needed. 30 tablet 3   promethazine (PHENERGAN) 12.5 MG tablet Take 1 tablet (12.5 mg total) by mouth every 4 (four) hours as needed for nausea or vomiting. 30 tablet 0   rizatriptan (MAXALT-MLT) 10 MG disintegrating tablet Take 1 tablet (10 mg total) by mouth as needed for migraine. May repeat in 2 hours if needed 9 tablet 11   No current facility-administered medications for this visit.    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;   2 stage embolization ACA supply to AVM @ [facility]  Sept 11th and 18th 2008 then craniotomy resection AVM 07-28-2007   Hypertension    Left ureteral stone    Migraines    Nephrolithiasis    left renal nonobstructive stone per CT 06-14-2019   Uterine fibroid     Past  Surgical History:  Procedure Laterality Date   CEREBRAL EMBOLIZATION  Sept 11th and 18th, 2008   @[facility]    2 staged embolization left ACA supply to AVM   CESAREAN SECTION  07/06/2009   CRANIOTOMY  07-29-2007   @ [facility]   resection left frontal AVM   IR EMBO TUMOR ORGAN ISCHEMIA INFARCT INC GUIDE ROADMAPPING  10/26/2023   IR FLUORO GUIDED NEEDLE PLC ASPIRATION/INJECTION LOC  10/26/2023   IR RADIOLOGIST EVAL & MGMT  10/12/2023    Family History  Problem Relation Age of Onset   Hypertension Mother    Arthritis Mother    Migraines Mother    Obesity Mother    Cancer Mother        sinus tumor   Hypertension Father    Alcohol abuse Father    Arthritis Father    Obesity Father    Migraines Sister    Ulcerative colitis Sister    Migraines Maternal Aunt    Colon cancer Maternal Grandmother 15   Cancer Maternal Grandmother        colon diagnosed at age 84   Dementia Maternal Grandmother    ADD / ADHD Son    Heart disease Neg Hx    Stomach cancer Neg Hx    Esophageal cancer Neg Hx     Social History   Tobacco Use   Smoking status: Never   Smokeless tobacco: Never  Substance Use Topics   Alcohol use: No    Alcohol/week: 0.0 standard drinks of alcohol    Subjective:   Flu-like symptoms x 3 days; home COVID and flu both negative; was running a fever which has now resolved; notes that Sunday and Monday night did have throbbing headaches- took Nurtec on Sunday and Monday but notes that head does feel better today; temperature checked in office today is without Advil or Tylenol;  Of note, was treated for UTI for on 01/12/24; currently on Keflex; does notice that symptoms are improved as well;   Objective:  Vitals:   01/18/24 1004  BP: 122/84  Pulse: 86  Temp: 98.6 F (37 C)  TempSrc: Oral  SpO2: 99%  Weight: 177 lb 9.6 oz (80.6 kg)  Height: 5\' 4"  (1.626 m)    General: Well developed, well nourished, in no acute distress  Skin : Warm and dry.  Head: Normocephalic and atraumatic   Eyes: Sclera and conjunctiva clear; pupils round and reactive to light; extraocular movements intact  Ears: External normal; canals clear; tympanic membranes normal  Oropharynx: Pink, supple. No suspicious lesions  Neck: Supple without thyromegaly, adenopathy  Lungs: Respirations unlabored; clear to auscultation bilaterally without wheeze, rales, rhonchi  CVS exam: normal rate and regular rhythm.  Neurologic: Alert and oriented; speech intact; face symmetrical; moves all  extremities well; CNII-XII intact without focal deficit   Assessment:  1. Dysuria   2. Viral illness   3. Other migraine without status migrainosus, not intractable     Plan:  Update urine culture to ensure resolution; Appears to be resolving; physical exam is reassuring;  ? Secondary to recent illness- has felt better this morning; if headache recurs tonight, patient will reach back out tomorrow and consider oral prednisone to break headache cycle;   No follow-ups on file.  Orders Placed This Encounter  Procedures   Urine Culture    Requested Prescriptions    No prescriptions requested or ordered in this encounter

## 2024-01-19 LAB — URINE CULTURE
MICRO NUMBER:: 16186275
Result:: NO GROWTH
SPECIMEN QUALITY:: ADEQUATE

## 2024-01-19 NOTE — Progress Notes (Signed)
 I have provided 5 minutes of non face to face time during this encounter for chart review and documentation.

## 2024-01-21 ENCOUNTER — Other Ambulatory Visit: Payer: Self-pay

## 2024-01-24 ENCOUNTER — Encounter: Payer: Self-pay | Admitting: Family

## 2024-01-24 ENCOUNTER — Other Ambulatory Visit: Payer: Self-pay

## 2024-01-24 NOTE — Progress Notes (Signed)
 Specialty Pharmacy Refill Coordination Note  CHERRIE FRANCA is a 43 y.o. female contacted today regarding refills of specialty medication(s) OnabotulinumtoxinA (Botox)   Patient requested Courier to Provider Office   Delivery date: 02/03/24   Verified address: [address] [place] [address]   Medication will be filled on 02/02/24.

## 2024-02-01 ENCOUNTER — Encounter: Payer: Self-pay | Admitting: Neurology

## 2024-02-01 ENCOUNTER — Other Ambulatory Visit (HOSPITAL_BASED_OUTPATIENT_CLINIC_OR_DEPARTMENT_OTHER): Payer: Self-pay

## 2024-02-01 MED ORDER — NURTEC 75 MG PO TBDP
ORAL_TABLET | ORAL | 11 refills | Status: DC
  Filled 2024-02-01: qty 16, 30d supply, fill #0
  Filled 2024-07-27: qty 16, 30d supply, fill #1

## 2024-02-02 ENCOUNTER — Other Ambulatory Visit: Payer: Self-pay

## 2024-02-10 ENCOUNTER — Ambulatory Visit (INDEPENDENT_AMBULATORY_CARE_PROVIDER_SITE_OTHER): Payer: Commercial Managed Care - PPO | Admitting: Neurology

## 2024-02-10 ENCOUNTER — Encounter: Payer: Self-pay | Admitting: Neurology

## 2024-02-10 DIAGNOSIS — G43711 Chronic migraine without aura, intractable, with status migrainosus: Secondary | ICD-10-CM | POA: Diagnosis not present

## 2024-02-10 MED ORDER — ONABOTULINUMTOXINA 200 UNITS IJ SOLR
155.0000 [IU] | Freq: Once | INTRAMUSCULAR | Status: AC
  Administered 2024-02-10: 155 [IU] via INTRAMUSCULAR

## 2024-02-10 NOTE — Progress Notes (Signed)
 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

## 2024-02-10 NOTE — Progress Notes (Signed)
 Consent Form Botulism Toxin Injection For Chronic Migraine 02/10/2024 11/18/2023: still > 60% improvement severity and freq.  08/24/2022: stable 06/02/2023: acutely nurtec helps. Zofran roo. aseline daily headaches and16 migraine days.Now only max 4-5 migraine days a month and > 50% decrease severity and increased treatability 12/14/2022: stable, doin ggreat 09/21/2022: 2 migraines a month, takes nurtec and ondansetron, doing great, gave a zavzpret sample did not like   06/22/2022: stable 03/19/2022: stable, doing great 12/25/2021: stable  05/27/2021: Stable  Interval history 09/11/2021: Baseline daily headaches and16 migraine days. Since last botox she has had only 2 total very mild migraines a month and acute management works, nurtec works with zofran, she had side effects to zembrace, also maxalt works, takes with zofran. More than 25 free headache days a month, exceptional improvement!.  She is very happy. She is on adderall now but not causing headache.   Orders: [facility] or  for dry needling for cervical and lumbar/lumbosacral dry needling for myofascial disease in the past       Reviewed orally with patient, additionally signature is on file:  Botulism toxin has been approved by the Federal drug administration for treatment of chronic migraine. Botulism toxin does not cure chronic migraine and it may not be effective in some patients.  The administration of botulism toxin is accomplished by injecting a small amount of toxin into the muscles of the neck and head. Dosage must be titrated for each individual. Any benefits resulting from botulism toxin tend to wear off after 3 months with a repeat injection required if benefit is to be maintained. Injections are usually done every 3-4 months with maximum effect peak achieved by about 2 or 3 weeks. Botulism toxin is expensive and you should be sure of what costs you will incur resulting from the injection.  The side  effects of botulism toxin use for chronic migraine may include:   -Transient, and usually mild, facial weakness with facial injections  -Transient, and usually mild, head or neck weakness with head/neck injections  -Reduction or loss of forehead facial animation due to forehead muscle weakness  -Eyelid drooping  -Dry eye  -Pain at the site of injection or bruising at the site of injection  -Double vision  -Potential unknown long term risks  Contraindications: You should not have Botox if you are pregnant, nursing, allergic to albumin, have an infection, skin condition, or muscle weakness at the site of the injection, or have myasthenia gravis, Lambert-Eaton syndrome, or ALS.  It is also possible that as with any injection, there may be an allergic reaction or no effect from the medication. Reduced effectiveness after repeated injections is sometimes seen and rarely infection at the injection site may occur. All care will be taken to prevent these side effects. If therapy is given over a long time, atrophy and wasting in the muscle injected may occur. Occasionally the patient's become refractory to treatment because they develop antibodies to the toxin. In this event, therapy needs to be modified.  I have read the above information and consent to the administration of botulism toxin.    BOTOX PROCEDURE NOTE FOR MIGRAINE HEADACHE    Contraindications and precautions discussed with patient(above). Aseptic procedure was observed and patient tolerated procedure. Procedure performed by Dr. Artemio Aly  The condition has existed for more than 6 months, and pt does not have a diagnosis of ALS, Myasthenia Gravis or Lambert-Eaton Syndrome.  Risks and benefits of injections discussed and pt agrees to proceed with  the procedure.  Written consent obtained  These injections are medically necessary. Pt  receives good benefits from these injections. These injections do not cause sedations or hallucinations  which the oral therapies may cause.  Description of procedure:  The patient was placed in a sitting position. The standard protocol was used for Botox as follows, with 5 units of Botox injected at each site:   -Procerus muscle, midline injection  -Corrugator muscle, bilateral injection  -Frontalis muscle, bilateral injection, with 2 sites each side, medial injection was performed in the upper one third of the frontalis muscle, in the region vertical from the medial inferior edge of the superior orbital rim. The lateral injection was again in the upper one third of the forehead vertically above the lateral limbus of the cornea, 1.5 cm lateral to the medial injection site.  -Temporalis muscle injection, 5 sites, bilaterally. The first injection was 3 cm above the tragus of the ear, second injection site was 1.5 cm to 3 cm up from the first injection site in line with the tragus of the ear. The third injection site was 1.5-3 cm forward between the first 2 injection sites. The fourth injection site was 1.5 cm posterior to the second injection site. 5th site laterally in the temporalis  muscleat the level of the outer canthus.  - Patient feels her clenching is a trigger for headaches. +5 units masseter bilaterally   - Patient feels the migraines are centered around the eyes +5 units bilaterally at the outer canthus in the orbicularis occuli  -Occipitalis muscle injection, 3 sites, bilaterally. The first injection was done one half way between the occipital protuberance and the tip of the mastoid process behind the ear. The second injection site was done lateral and superior to the first, 1 fingerbreadth from the first injection. The third injection site was 1 fingerbreadth superiorly and medially from the first injection site.  -Cervical paraspinal muscle injection, 2 sites, bilateral knee first injection site was 1 cm from the midline of the cervical spine, 3 cm inferior to the lower border of the  occipital protuberance. The second injection site was 1.5 cm superiorly and laterally to the first injection site.  -Trapezius muscle injection was performed at 3 sites, bilaterally. The first injection site was in the upper trapezius muscle halfway between the inflection point of the neck, and the acromion. The second injection site was one half way between the acromion and the first injection site. The third injection was done between the first injection site and the inflection point of the neck.   Will return for repeat injection in 3 months.   A 200 unit sof Botox was used, 45 U Botox not injected was wasted. The patient tolerated the procedure well, there were no complications of the above procedure.

## 2024-02-18 ENCOUNTER — Other Ambulatory Visit: Payer: Self-pay | Admitting: Family

## 2024-02-18 ENCOUNTER — Other Ambulatory Visit (HOSPITAL_BASED_OUTPATIENT_CLINIC_OR_DEPARTMENT_OTHER): Payer: Self-pay

## 2024-02-18 ENCOUNTER — Other Ambulatory Visit: Payer: Self-pay

## 2024-02-18 ENCOUNTER — Other Ambulatory Visit (HOSPITAL_COMMUNITY): Payer: Self-pay

## 2024-02-18 ENCOUNTER — Telehealth: Admitting: Physician Assistant

## 2024-02-18 DIAGNOSIS — M545 Low back pain, unspecified: Secondary | ICD-10-CM

## 2024-02-18 DIAGNOSIS — F900 Attention-deficit hyperactivity disorder, predominantly inattentive type: Secondary | ICD-10-CM

## 2024-02-18 MED ORDER — LISDEXAMFETAMINE DIMESYLATE 50 MG PO CAPS
50.0000 mg | ORAL_CAPSULE | Freq: Every day | ORAL | 0 refills | Status: DC
  Filled 2024-02-18 – 2024-03-07 (×2): qty 30, 30d supply, fill #0

## 2024-02-18 MED ORDER — CYCLOBENZAPRINE HCL 10 MG PO TABS
5.0000 mg | ORAL_TABLET | Freq: Three times a day (TID) | ORAL | 0 refills | Status: AC | PRN
  Filled 2024-02-18: qty 30, 10d supply, fill #0

## 2024-02-18 NOTE — Telephone Encounter (Signed)
 Requesting: Vyvanse 50 mg Contract: 12/08/2023 UDS: 12/08/2023 Last Visit: 01/18/2024 Next Visit: 04/07/2024 Last Refill: 12/08/2023  Please Advise

## 2024-02-18 NOTE — Progress Notes (Signed)

## 2024-02-19 ENCOUNTER — Other Ambulatory Visit (HOSPITAL_BASED_OUTPATIENT_CLINIC_OR_DEPARTMENT_OTHER): Payer: Self-pay

## 2024-02-28 ENCOUNTER — Other Ambulatory Visit (HOSPITAL_BASED_OUTPATIENT_CLINIC_OR_DEPARTMENT_OTHER): Payer: Self-pay

## 2024-03-07 ENCOUNTER — Other Ambulatory Visit: Payer: Self-pay

## 2024-03-27 ENCOUNTER — Telehealth: Payer: Self-pay | Admitting: Pharmacist

## 2024-03-27 NOTE — Telephone Encounter (Signed)
 Pharmacy Patient Advocate Encounter  Received notification from [facility] that Prior Authorization for Nurtec 75MG  dispersible tablets has been APPROVED from 03/27/2024 to 03/27/2025   PA #/Case ID/Reference #: [id number]

## 2024-03-27 NOTE — Telephone Encounter (Signed)
 Pharmacy Patient Advocate Encounter   Received notification from CoverMyMeds that prior authorization for Nurtec 75MG  dispersible tablets is required/requested.   Insurance verification completed.   The patient is insured through [facility] .   Per test claim: PA required; PA submitted to above mentioned insurance via CoverMyMeds Key/confirmation #/EOC [id number] Status is pending

## 2024-04-05 ENCOUNTER — Telehealth: Payer: Self-pay | Admitting: Neurology

## 2024-04-05 NOTE — Telephone Encounter (Signed)
 Submitted PA via CMM, status is pending. Key: [id number]

## 2024-04-07 ENCOUNTER — Other Ambulatory Visit: Payer: Self-pay | Admitting: Medical Genetics

## 2024-04-07 ENCOUNTER — Other Ambulatory Visit (HOSPITAL_BASED_OUTPATIENT_CLINIC_OR_DEPARTMENT_OTHER): Payer: Self-pay

## 2024-04-07 ENCOUNTER — Ambulatory Visit: Payer: Commercial Managed Care - PPO | Admitting: Family

## 2024-04-07 ENCOUNTER — Other Ambulatory Visit: Payer: Self-pay

## 2024-04-07 VITALS — BP 145/100 | HR 97 | Temp 98.0°F | Resp 16 | Ht 64.0 in | Wt 183.0 lb

## 2024-04-07 DIAGNOSIS — F900 Attention-deficit hyperactivity disorder, predominantly inattentive type: Secondary | ICD-10-CM

## 2024-04-07 DIAGNOSIS — J301 Allergic rhinitis due to pollen: Secondary | ICD-10-CM | POA: Diagnosis not present

## 2024-04-07 DIAGNOSIS — R195 Other fecal abnormalities: Secondary | ICD-10-CM

## 2024-04-07 DIAGNOSIS — G43109 Migraine with aura, not intractable, without status migrainosus: Secondary | ICD-10-CM | POA: Diagnosis not present

## 2024-04-07 DIAGNOSIS — K219 Gastro-esophageal reflux disease without esophagitis: Secondary | ICD-10-CM

## 2024-04-07 DIAGNOSIS — I10 Essential (primary) hypertension: Secondary | ICD-10-CM

## 2024-04-07 DIAGNOSIS — Z006 Encounter for examination for normal comparison and control in clinical research program: Secondary | ICD-10-CM

## 2024-04-07 MED ORDER — METOPROLOL SUCCINATE ER 50 MG PO TB24
50.0000 mg | ORAL_TABLET | Freq: Every day | ORAL | 1 refills | Status: DC
  Filled 2024-04-07: qty 90, 90d supply, fill #0
  Filled 2024-07-27: qty 90, 90d supply, fill #1

## 2024-04-07 MED ORDER — LISDEXAMFETAMINE DIMESYLATE 50 MG PO CAPS
50.0000 mg | ORAL_CAPSULE | Freq: Every day | ORAL | 0 refills | Status: DC
  Filled 2024-04-07: qty 30, 30d supply, fill #0

## 2024-04-07 NOTE — Assessment & Plan Note (Signed)
 Stable on xyzal .

## 2024-04-07 NOTE — Assessment & Plan Note (Addendum)
 BP Readings from Last 3 Encounters:  04/07/24 (!) 150/101  01/18/24 122/84  12/10/23 116/80   Blood pressure elevated. Current medications: amlodipine  5 mg, metoprolol  XL 25 mg. Anxiety and pain may contribute to hypertension. Increasing metoprolol  expected to improve control. - Increase metoprolol  xl to 50 mg, use two 25 mg tablets until current supply exhausted. - Continue amlodipine  5 mg. - Recheck blood pressure in one month.

## 2024-04-07 NOTE — Assessment & Plan Note (Signed)
 Stable with vyvanse . Contract up to date.

## 2024-04-07 NOTE — Assessment & Plan Note (Addendum)
 Had colo with GI- erosive gastropathy- pt d/c'd NSAIDS. Plan to repeat colo 10/25.

## 2024-04-07 NOTE — Assessment & Plan Note (Signed)
 Fair control.  Continues botox  with Neuro- as well as nurtec.

## 2024-04-07 NOTE — Patient Instructions (Signed)
 VISIT SUMMARY:  Today, we discussed your blood pressure, migraines, ADHD, allergies, and a previous colonoscopy finding. We made some adjustments to your medications and planned follow-up steps.  YOUR PLAN:  HYPERTENSION: Your blood pressure is elevated, possibly due to stress and pain. -Increase metoprolol  to 50 mg by taking two 25 mg tablets until your current supply is exhausted. -Continue taking amlodipine  5 mg. -Recheck your blood pressure in one month.  MIGRAINE: You have had a few extra migraines this month, likely due to sinus issues, but they are generally well-controlled. -Continue using Botox , Nurtec, and Zofran  as prescribed.  ADHD: Your ADHD symptoms are managed with Vyvanse , which helps improve your focus. -A refill for Vyvanse  has been sent to your pharmacy.  ALLERGIES: You are experiencing nasal congestion and sneezing, likely due to allergies and a recent illness. -Monitor your symptoms and consider over-the-counter allergy medications if needed.  HEME POSITIVE STOOL: A previous colonoscopy showed an atypical spot, possibly due to chronic NSAID use. -Stop taking NSAIDs. -A repeat colonoscopy is planned for October.

## 2024-04-07 NOTE — Assessment & Plan Note (Signed)
 Not currently taking omeprazole

## 2024-04-07 NOTE — Progress Notes (Signed)
 Subjective:     Patient ID: Kaitlin Foster, female    DOB: 1981/09/17, 43 y.o.   MRN: [id number]  Chief Complaint  Patient presents with   ADHD    Here for follow up    HPI  Discussed the use of AI scribe software for clinical note transcription with the patient, who gave verbal consent to proceed.  History of Present Illness  Kaitlin Foster is a 43 year old female with hypertension and migraines who presents for a follow-up on her medication.  She experiences nasal congestion and sneezing, likely due to allergies and a recent family illness. Her blood pressure fluctuates, possibly due to work-related stress. She takes amlodipine  5 mg and metoprolol  XL 25 mg. She previously reduced metoprolol  when starting Contrave  but is not currently taking it. She uses leftover Wegovy , resulting in a four-pound weight loss over three weeks.  She has had a few extra migraines this month, potentially related to sinus issues. Her migraines are generally well-controlled with Botox , Nurtec, and Zofran . Nurtec effectively resolves migraines within two hours if taken promptly.  She occasionally experiences reflux symptoms but is not on medication for it. She takes Vyvanse  for ADHD, which she finds helpful. Her mood, anxiety, and depression are 'so-so', with additional stress from her son's ADHD and autism challenges.     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;   2 stage embolization ACA supply to  AVM @ [facility]  Sept 11th and 18th 2008 then craniotomy resection AVM 07-28-2007   Hypertension    Left ureteral stone    Migraines    Nephrolithiasis    left renal nonobstructive stone per CT 06-14-2019   Uterine fibroid     Past Surgical History:  Procedure Laterality Date   CEREBRAL EMBOLIZATION  Sept 11th and 18th, 2008   @[facility]    2 staged embolization left ACA supply to AVM   CESAREAN SECTION  07/06/2009   CRANIOTOMY  07-29-2007   @ [facility]   resection left frontal AVM   IR EMBO TUMOR ORGAN ISCHEMIA INFARCT INC GUIDE ROADMAPPING  10/26/2023   IR FLUORO GUIDED NEEDLE PLC ASPIRATION/INJECTION LOC  10/26/2023   IR RADIOLOGIST EVAL & MGMT  10/12/2023    Family History  Problem Relation Age of Onset   Hypertension Mother    Arthritis Mother    Migraines Mother    Obesity Mother    Cancer Mother        sinus tumor   Hypertension Father    Alcohol abuse Father    Arthritis Father    Obesity Father    Migraines Sister    Ulcerative colitis Sister    Migraines Maternal Aunt    Colon cancer Maternal Grandmother 48   Cancer Maternal Grandmother        colon diagnosed at age 20   Dementia Maternal Grandmother    ADD / ADHD Son    Heart disease  Neg Hx    Stomach cancer Neg Hx    Esophageal cancer Neg Hx     Social History   Socioeconomic History   Marital status: Married    Spouse name: Thad   Number of children: 1   Years of education: Not on file   Highest education level: Associate degree: academic program  Occupational History   Occupation: Charity fundraiser   Occupation: Engineer, civil (consulting)  Tobacco Use   Smoking status: Never   Smokeless tobacco: Never  Vaping Use   Vaping status: Never Used  Substance and Sexual Activity   Alcohol use: No    Alcohol/week: 0.0 standard drinks of alcohol   Drug use: No   Sexual activity: Yes    Birth control/protection: Condom  Other Topics Concern   Not on file  Social History Narrative   Regular exercise: no regular exercise   Caffeine use:  1-2  coffee daily   Son born 2010   Works as a Engineer, 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)   Married   Lives at home with her husband and child      Social Drivers of Corporate investment banker Strain: Low Risk  (06/06/2023)   Overall Financial Resource Strain (CARDIA)    Difficulty of Paying Living Expenses: Not hard at all  Food Insecurity: No Food Insecurity (06/06/2023)   Hunger Vital Sign    Worried About Running Out of Food in the Last Year: Never true    Ran Out of Food in the Last Year: Never true  Transportation Needs: No Transportation Needs (06/06/2023)   PRAPARE - Administrator, Civil Service (Medical): No    Lack of Transportation (Non-Medical): No  Physical Activity: Not on file  Stress: Not on file  Social Connections: Socially Integrated (06/06/2023)   Social Connection and Isolation Panel [NHANES]    Frequency of Communication with Friends and Family: More than three times a week    Frequency of Social Gatherings with Friends and Family: Twice a week    Attends Religious Services: More than 4 times per year    Active Member of [employer] or Organizations: Yes    Attends Engineer, structural: More than 4 times per year    Marital Status: Married  Catering manager Violence: Not on file    Outpatient Medications Prior to Visit  Medication Sig Dispense Refill   amLODipine  (NORVASC ) 5 MG tablet Take 1 tablet (5 mg total) by mouth daily. 90 tablet 1   botulinum toxin Type A  (BOTOX ) 200 units injection Provider to inject 155 units into the muscles of the head and neck every 12 weeks. Discard remainder. 1 each 3   Cholecalciferol (VITAMIN D3) 75 MCG (3000 UT) TABS Take 1 tablet by mouth daily. 30 tablet    cyclobenzaprine  (FLEXERIL ) 10 MG tablet Take 0.5-1 tablets (5-10 mg total) by mouth 3 (three) times daily as needed. 30 tablet 0   docusate sodium  (COLACE) 50 MG capsule Take 50 mg by mouth 2 (two) times daily.     famotidine (PEPCID) 20 MG tablet Take 20 mg by mouth  daily as needed for heartburn or indigestion.      Ferrous Sulfate (IRON ) 325 (65 Fe) MG TABS Take 1 tablet (325 mg total) by mouth daily. 30 tablet 0   fluticasone  (FLONASE ) 50 MCG/ACT nasal spray Place 2 sprays into both nostrils daily. 16 g 0   levocetirizine (XYZAL ) 5 MG tablet Take 1 tablet (5 mg total) by mouth every evening.  90 tablet 4   ondansetron  (ZOFRAN ) 8 MG tablet Take 1 tablet (8 mg total) by mouth every 8 (eight) hours as needed for nausea or vomiting. 30 tablet 0   ondansetron  (ZOFRAN -ODT) 4 MG disintegrating tablet Dissolve 1-2 tablets (4-8 mg total) by mouth every 8 (eight) hours as needed. 30 tablet 3   promethazine  (PHENERGAN ) 12.5 MG tablet Take 1 tablet (12.5 mg total) by mouth every 4 (four) hours as needed for nausea or vomiting. 30 tablet 0   Rimegepant Sulfate  (NURTEC) 75 MG TBDP DISSOLVE 1 TABLET BY MOUTH DAILY AS NEEDED FOR MIGRAINES. TAKE AS CLOSE TO ONSET OF MIGRAINE AS POSSIBLE. ONE DAILY MAXIMUM. 16 tablet 11   rizatriptan  (MAXALT -MLT) 10 MG disintegrating tablet Take 1 tablet (10 mg total) by mouth as needed for migraine. May repeat in 2 hours if needed 9 tablet 11   lisdexamfetamine (VYVANSE ) 50 MG capsule Take 1 capsule (50 mg total) by mouth daily. 30 capsule 0   metoprolol  succinate (TOPROL -XL) 25 MG 24 hr tablet Take 1 tablet (25 mg total) by mouth daily. 90 tablet 1   Naltrexone -buPROPion  HCl ER (CONTRAVE ) 8-90 MG TB12 Take 1 tablet twice daily for 1 week; then 2 tablets in the morning and 1 tablet in the evening for 1 week; and then 2 tablets twice daily. 360 tablet 0   omeprazole  (PRILOSEC) 40 MG capsule Take 1 capsule (40 mg total) by mouth daily. 30 capsule 1   No facility-administered medications prior to visit.    Allergies  Allergen Reactions   Sulfa Antibiotics Hives   Lisinopril Other (See Comments)    cough    ROS See HPI    Objective:     Physical Exam Constitutional:      General: She is not in acute distress.    Appearance: Normal  appearance. She is well-developed.  HENT:     Head: Normocephalic and atraumatic.     Right Ear: External ear normal.     Left Ear: External ear normal.  Eyes:     General: No scleral icterus. Neck:     Thyroid : No thyromegaly.  Cardiovascular:     Rate and Rhythm: Normal rate and regular rhythm.     Heart sounds: Normal heart sounds. No murmur heard. Pulmonary:     Effort: Pulmonary effort is normal. No respiratory distress.     Breath sounds: Normal breath sounds. No wheezing.  Musculoskeletal:     Cervical back: Neck supple.  Skin:    General: Skin is warm and dry.  Neurological:     Mental Status: She is alert and oriented to person, place, and time.  Psychiatric:        Mood and Affect: Mood normal.        Behavior: Behavior normal.        Thought Content: Thought content normal.        Judgment: Judgment normal.      BP (!) 145/100   Pulse 97   Temp 98 F (36.7 C) (Oral)   Resp 16   Ht 5\' 4"  (1.626 m)   Wt 183 lb (83 kg)   SpO2 99%   BMI 31.41 kg/m  Wt Readings from Last 3 Encounters:  04/07/24 183 lb (83 kg)  01/18/24 177 lb 9.6 oz (80.6 kg)  12/10/23 172 lb (78 kg)       Assessment & Plan:   Problem List Items Addressed This Visit       Unprioritized   Migraines  Fair control.  Continues botox  with Neuro- as well as nurtec.      Relevant Medications   metoprolol  succinate (TOPROL -XL) 50 MG 24 hr tablet   Hypertension - Primary   BP Readings from Last 3 Encounters:  04/07/24 (!) 150/101  01/18/24 122/84  12/10/23 116/80   Blood pressure elevated. Current medications: amlodipine  5 mg, metoprolol  XL 25 mg. Anxiety and pain may contribute to hypertension. Increasing metoprolol  expected to improve control. - Increase metoprolol  xl to 50 mg, use two 25 mg tablets until current supply exhausted. - Continue amlodipine  5 mg. - Recheck blood pressure in one month.      Relevant Medications   metoprolol  succinate (TOPROL -XL) 50 MG 24 hr tablet    Heme positive stool   Had colo with GI- erosive gastropathy- pt d/c'd NSAIDS. Plan to repeat colo 10/25.      GERD (gastroesophageal reflux disease)   Not currently taking omeprazole         Allergic rhinitis   Stable on xyzal .      ADHD, predominantly inattentive type   Stable with vyvanse . Contract up to date.       Relevant Medications   lisdexamfetamine (VYVANSE ) 50 MG capsule    I have discontinued Birdena H. Tarr's Contrave , omeprazole , and metoprolol  succinate. I am also having her start on metoprolol  succinate. Additionally, I am having her maintain her rizatriptan , famotidine, Iron , Vitamin D3, docusate sodium , fluticasone , levocetirizine, promethazine , ondansetron , amLODipine , ondansetron , Botox , Nurtec, cyclobenzaprine , and lisdexamfetamine.  Meds ordered this encounter  Medications   metoprolol  succinate (TOPROL -XL) 50 MG 24 hr tablet    Sig: Take 1 tablet (50 mg total) by mouth daily. Take with or immediately following a meal.    Dispense:  90 tablet    Refill:  1    Supervising Provider:   Randie Bustle A [[id number]]   lisdexamfetamine (VYVANSE ) 50 MG capsule    Sig: Take 1 capsule (50 mg total) by mouth daily.    Dispense:  30 capsule    Refill:  0    Supervising Provider:   Randie Bustle A [[id number]]

## 2024-04-12 NOTE — Telephone Encounter (Signed)
 Auth#: [id number] (04/07/24-04/06/25), pt will continue to fill through [facility]

## 2024-04-15 ENCOUNTER — Telehealth: Admitting: Physician Assistant

## 2024-04-15 DIAGNOSIS — B9689 Other specified bacterial agents as the cause of diseases classified elsewhere: Secondary | ICD-10-CM | POA: Diagnosis not present

## 2024-04-15 DIAGNOSIS — J019 Acute sinusitis, unspecified: Secondary | ICD-10-CM

## 2024-04-15 MED ORDER — AMOXICILLIN-POT CLAVULANATE 875-125 MG PO TABS: 1.0000 | ORAL_TABLET | Freq: Two times a day (BID) | ORAL | 0 refills | Status: DC

## 2024-04-15 NOTE — Progress Notes (Signed)

## 2024-04-15 NOTE — Progress Notes (Signed)
 I have spent 5 minutes in review of e-visit questionnaire, review and updating patient chart, medical decision making and response to patient.   Piedad Climes, PA-C

## 2024-04-17 LAB — GENECONNECT MOLECULAR SCREEN: Genetic Analysis Overall Interpretation: NEGATIVE

## 2024-04-27 ENCOUNTER — Other Ambulatory Visit: Payer: Self-pay

## 2024-04-27 ENCOUNTER — Other Ambulatory Visit: Payer: Self-pay | Admitting: Pharmacy Technician

## 2024-04-27 NOTE — Progress Notes (Signed)
 Specialty Pharmacy Refill Coordination Note  Kaitlin Foster is a 43 y.o. female contacted today regarding refills of specialty medication(s) OnabotulinumtoxinA  (Botox )   Patient requested Courier to Provider Office   Delivery date: 05/01/24   Verified address: [facility] [address] [place], [place]   Medication will be filled on 04/28/24.  Appt 05/08/24 Copay $0

## 2024-05-01 ENCOUNTER — Other Ambulatory Visit: Payer: Self-pay

## 2024-05-08 ENCOUNTER — Ambulatory Visit: Admitting: Neurology

## 2024-05-08 DIAGNOSIS — G43711 Chronic migraine without aura, intractable, with status migrainosus: Secondary | ICD-10-CM

## 2024-05-08 MED ORDER — ONABOTULINUMTOXINA 200 UNITS IJ SOLR
155.0000 [IU] | Freq: Once | INTRAMUSCULAR | Status: AC
  Administered 2024-05-08: 155 [IU] via INTRAMUSCULAR

## 2024-05-08 NOTE — Progress Notes (Signed)
 Consent Form Botulism Toxin Injection For Chronic Migraine 05/08/2024: Stable, doing well 02/10/2024: aseline daily headaches and16 migraine days.Now only max 4-5 migraine days a month and > 50% decrease severity and increased treatability 11/18/2023: still > 60% improvement severity and freq.  08/24/2022: stable 06/02/2023: acutely nurtec helps. Zofran  roo. aseline daily headaches and16 migraine days.Now only max 4-5 migraine days a month and > 50% decrease severity and increased treatability 12/14/2022: stable, doin ggreat 09/21/2022: 2 migraines a month, takes nurtec and ondansetron , doing great, gave a zavzpret  sample did not like   06/22/2022: stable 03/19/2022: stable, doing great 12/25/2021: stable  05/27/2021: Stable  Interval history 09/11/2021: Baseline daily headaches and16 migraine days. Since last botox  she has had only 2 total very mild migraines a month and acute management works, nurtec works with [employer] , she had side effects to zembrace, also maxalt  works, takes with zofran . More than 25 free headache days a month, exceptional improvement!.  She is very happy. She is on adderall  now but not causing headache.   Orders: [facility] or [place] for dry needling for cervical and lumbar/lumbosacral dry needling for myofascial disease in the past       Reviewed orally with patient, additionally signature is on file:  Botulism toxin has been approved by the Federal drug administration for treatment of chronic migraine. Botulism toxin does not cure chronic migraine and it may not be effective in some patients.  The administration of botulism toxin is accomplished by injecting a small amount of toxin into the muscles of the neck and head. Dosage must be titrated for each individual. Any benefits resulting from botulism toxin tend to wear off after 3 months with a repeat injection required if benefit is to be maintained. Injections are usually done every 3-4 months with  maximum effect peak achieved by about 2 or 3 weeks. Botulism toxin is expensive and you should be sure of what costs you will incur resulting from the injection.  The side effects of botulism toxin use for chronic migraine may include:   -Transient, and usually mild, facial weakness with facial injections  -Transient, and usually mild, head or neck weakness with head/neck injections  -Reduction or loss of forehead facial animation due to forehead muscle weakness  -Eyelid drooping  -Dry eye  -Pain at the site of injection or bruising at the site of injection  -Double vision  -Potential unknown long term risks  Contraindications: You should not have Botox  if you are pregnant, nursing, allergic to albumin, have an infection, skin condition, or muscle weakness at the site of the injection, or have myasthenia gravis, Lambert-Eaton syndrome, or ALS.  It is also possible that as with any injection, there may be an allergic reaction or no effect from the medication. Reduced effectiveness after repeated injections is sometimes seen and rarely infection at the injection site may occur. All care will be taken to prevent these side effects. If therapy is given over a long time, atrophy and wasting in the muscle injected may occur. Occasionally the patient's become refractory to treatment because they develop antibodies to the toxin. In this event, therapy needs to be modified.  I have read the above information and consent to the administration of botulism toxin.    BOTOX  PROCEDURE NOTE FOR MIGRAINE HEADACHE    Contraindications and precautions discussed with patient(above). Aseptic procedure was observed and patient tolerated procedure. Procedure performed by Dr. Andree Epp  The condition has existed for more than 6 months, and pt  does not have a diagnosis of ALS, Myasthenia Gravis or Lambert-Eaton Syndrome.  Risks and benefits of injections discussed and pt agrees to proceed with the procedure.   Written consent obtained  These injections are medically necessary. Pt  receives good benefits from these injections. These injections do not cause sedations or hallucinations which the oral therapies may cause.  Description of procedure:  The patient was placed in a sitting position. The standard protocol was used for Botox  as follows, with 5 units of Botox  injected at each site:   -Procerus muscle, midline injection  -Corrugator muscle, bilateral injection  -Frontalis muscle, bilateral injection, with 2 sites each side, medial injection was performed in the upper one third of the frontalis muscle, in the region vertical from the medial inferior edge of the superior orbital rim. The lateral injection was again in the upper one third of the forehead vertically above the lateral limbus of the cornea, 1.5 cm lateral to the medial injection site.  -Temporalis muscle injection, 5 sites, bilaterally. The first injection was 3 cm above the tragus of the ear, second injection site was 1.5 cm to 3 cm up from the first injection site in line with the tragus of the ear. The third injection site was 1.5-3 cm forward between the first 2 injection sites. The fourth injection site was 1.5 cm posterior to the second injection site. 5th site laterally in the temporalis  muscleat the level of the outer canthus.  - Patient feels her clenching is a trigger for headaches. +5 units masseter bilaterally   - Patient feels the migraines are centered around the eyes +5 units bilaterally at the outer canthus in the orbicularis occuli  -Occipitalis muscle injection, 3 sites, bilaterally. The first injection was done one half way between the occipital protuberance and the tip of the mastoid process behind the ear. The second injection site was done lateral and superior to the first, 1 fingerbreadth from the first injection. The third injection site was 1 fingerbreadth superiorly and medially from the first injection  site.  -Cervical paraspinal muscle injection, 2 sites, bilateral knee first injection site was 1 cm from the midline of the cervical spine, 3 cm inferior to the lower border of the occipital protuberance. The second injection site was 1.5 cm superiorly and laterally to the first injection site.  -Trapezius muscle injection was performed at 3 sites, bilaterally. The first injection site was in the upper trapezius muscle halfway between the inflection point of the neck, and the acromion. The second injection site was one half way between the acromion and the first injection site. The third injection was done between the first injection site and the inflection point of the neck.   Will return for repeat injection in 3 months.   A 200 unit sof Botox  was used, 45 U Botox  not injected was wasted. The patient tolerated the procedure well, there were no complications of the above procedure.

## 2024-05-09 ENCOUNTER — Ambulatory Visit: Admitting: Family

## 2024-05-10 ENCOUNTER — Other Ambulatory Visit (HOSPITAL_BASED_OUTPATIENT_CLINIC_OR_DEPARTMENT_OTHER): Payer: Self-pay

## 2024-05-10 ENCOUNTER — Ambulatory Visit: Admitting: Family

## 2024-05-10 VITALS — BP 118/79 | HR 78 | Temp 98.4°F | Resp 17 | Ht 64.0 in | Wt 183.0 lb

## 2024-05-10 DIAGNOSIS — I10 Essential (primary) hypertension: Secondary | ICD-10-CM | POA: Diagnosis not present

## 2024-05-10 NOTE — Progress Notes (Signed)
 Subjective:     Patient ID: Kaitlin Foster, female    DOB: [birth year], 43 y.o.   MRN: [id number]  Chief Complaint  Patient presents with   Hypertension    No concerns     Hypertension    Discussed the use of AI scribe software for clinical note transcription with the patient, who gave verbal consent to proceed.  History of Present Illness Kaitlin Foster is a 43 year old female with hypertension who presents for a follow-up on her blood pressure management.  She is on amlodipine  5 mg and metoprolol  xl 50 mg for hypertension. Her metoprolol  dose was increased from 25 mg to 50 mg due to inadequate blood pressure control last visit. She feels well on this regimen without significant side effects. Her blood pressure was previously 145/100 mmHg.   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   Anemia    Anxiety    Chronic migraine    neurologist-  dr ines--- botox  injecitons every 3 months   GERD (gastroesophageal reflux disease)    History of arteriovenous malformation (AVM) (06-16-2019-  per pt residual very very mild aphsia and slight facial droop)   06-05-2007  ED visit w/ severe headche for 3 days---  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;   2 stage embolization ACA supply to AVM @ [facility]  Sept 11th and 18th 2008 then craniotomy resection AVM 07-28-2007   Hypertension    Left ureteral stone    Migraines    Nephrolithiasis    left renal nonobstructive stone per CT 06-14-2019   Uterine fibroid     Past Surgical History:  Procedure Laterality Date   CEREBRAL EMBOLIZATION  Sept 11th and 18th, 2008   @[facility]    2  staged embolization left ACA supply to AVM   CESAREAN SECTION  07/06/2009   CRANIOTOMY  07-29-2007   @ [facility]   resection left frontal AVM   IR EMBO TUMOR ORGAN ISCHEMIA INFARCT INC GUIDE ROADMAPPING  10/26/2023   IR FLUORO GUIDED NEEDLE PLC ASPIRATION/INJECTION LOC  10/26/2023   IR RADIOLOGIST EVAL & MGMT  10/12/2023    Family History  Problem Relation Age of Onset   Hypertension Mother    Arthritis Mother    Migraines Mother    Obesity Mother    Cancer Mother        sinus tumor   Hypertension Father    Alcohol abuse Father    Arthritis Father    Obesity Father    Migraines Sister    Ulcerative colitis Sister    Migraines Maternal Aunt    Colon cancer Maternal Grandmother 58   Cancer Maternal Grandmother        colon diagnosed at age 77   Dementia Maternal Grandmother    ADD / ADHD Son    Heart disease Neg Hx    Stomach cancer Neg Hx    Esophageal cancer Neg Hx     Social History   Socioeconomic History   Marital status: Married    Spouse name: Thad  Number of children: 1   Years of education: Not on file   Highest education level: Associate degree: occupational, Scientist, product/process development, or vocational program  Occupational History   Occupation: Charity fundraiser   Occupation: Engineer, civil (consulting)  Tobacco Use   Smoking status: Never   Smokeless tobacco: Never  Vaping Use   Vaping status: Never Used  Substance and Sexual Activity   Alcohol use: No    Alcohol/week: 0.0 standard drinks of alcohol   Drug use: No   Sexual activity: Yes    Birth control/protection: Condom  Other Topics Concern   Not on file  Social History Narrative   Regular exercise: no regular exercise   Caffeine use:  1-2 coffee daily   Son born 2010   Works as a Engineer, 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)   Married   Lives at home with her husband and child      Social Drivers of Corporate investment banker Strain: Low Risk  (05/10/2024)   Overall Financial Resource Strain (CARDIA)    Difficulty of Paying Living Expenses: Not hard at  all  Food Insecurity: No Food Insecurity (05/10/2024)   Hunger Vital Sign    Worried About Running Out of Food in the Last Year: Never true    Ran Out of Food in the Last Year: Never true  Transportation Needs: No Transportation Needs (05/10/2024)   PRAPARE - Administrator, Civil Service (Medical): No    Lack of Transportation (Non-Medical): No  Physical Activity: Inactive (05/10/2024)   Exercise Vital Sign    Days of Exercise per Week: 0 days    Minutes of Exercise per Session: Not on file  Stress: No Stress Concern Present (05/10/2024)   [employer] of Occupational Health - Occupational Stress Questionnaire    Feeling of Stress: Only a little  Social Connections: Socially Integrated (05/10/2024)   Social Connection and Isolation Panel    Frequency of Communication with Friends and Family: Three times a week    Frequency of Social Gatherings with Friends and Family: Twice a week    Attends Religious Services: More than 4 times per year    Active Member of [employer] or Organizations: Yes    Attends Engineer, structural: More than 4 times per year    Marital Status: Married  Catering manager Violence: Not on file    Outpatient Medications Prior to Visit  Medication Sig Dispense Refill   amLODipine  (NORVASC ) 5 MG tablet Take 1 tablet (5 mg total) by mouth daily. 90 tablet 1   botulinum toxin Type A  (BOTOX ) 200 units injection Provider to inject 155 units into the muscles of the head and neck every 12 weeks. Discard remainder. 1 each 3   Cholecalciferol (VITAMIN D3) 75 MCG (3000 UT) TABS Take 1 tablet by mouth daily. 30 tablet    cyclobenzaprine  (FLEXERIL ) 10 MG tablet Take 0.5-1 tablets (5-10 mg total) by mouth 3 (three) times daily as needed. 30 tablet 0   docusate sodium  (COLACE) 50 MG capsule Take 50 mg by mouth 2 (two) times daily.     famotidine (PEPCID) 20 MG tablet Take 20 mg by mouth daily as needed for heartburn or indigestion.      Ferrous Sulfate (IRON ) 325  (65 Fe) MG TABS Take 1 tablet (325 mg total) by mouth daily. 30 tablet 0   fluticasone  (FLONASE ) 50 MCG/ACT nasal spray Place 2 sprays into both nostrils daily. 16 g 0   levocetirizine (XYZAL ) 5 MG tablet Take 1  tablet (5 mg total) by mouth every evening. 90 tablet 4   lisdexamfetamine (VYVANSE ) 50 MG capsule Take 1 capsule (50 mg total) by mouth daily. 30 capsule 0   metoprolol  succinate (TOPROL -XL) 50 MG 24 hr tablet Take 1 tablet (50 mg total) by mouth daily. Take with or immediately following a meal. 90 tablet 1   ondansetron  (ZOFRAN -ODT) 4 MG disintegrating tablet Dissolve 1-2 tablets (4-8 mg total) by mouth every 8 (eight) hours as needed. 30 tablet 3   Rimegepant Sulfate  (NURTEC) 75 MG TBDP DISSOLVE 1 TABLET BY MOUTH DAILY AS NEEDED FOR MIGRAINES. TAKE AS CLOSE TO ONSET OF MIGRAINE AS POSSIBLE. ONE DAILY MAXIMUM. 16 tablet 11   rizatriptan  (MAXALT -MLT) 10 MG disintegrating tablet Take 1 tablet (10 mg total) by mouth as needed for migraine. May repeat in 2 hours if needed 9 tablet 11   amoxicillin -clavulanate (AUGMENTIN ) 875-125 MG tablet Take 1 tablet by mouth 2 (two) times daily. 14 tablet 0   No facility-administered medications prior to visit.    Allergies  Allergen Reactions   Sulfa Antibiotics Hives   Lisinopril Other (See Comments)    cough    ROS See HPI    Objective:    Physical Exam Constitutional:      General: She is not in acute distress.    Appearance: Normal appearance. She is well-developed.  HENT:     Head: Normocephalic and atraumatic.     Right Ear: External ear normal.     Left Ear: External ear normal.  Eyes:     General: No scleral icterus. Neck:     Thyroid : No thyromegaly.  Cardiovascular:     Rate and Rhythm: Normal rate and regular rhythm.     Heart sounds: Normal heart sounds. No murmur heard. Pulmonary:     Effort: Pulmonary effort is normal. No respiratory distress.     Breath sounds: Normal breath sounds. No wheezing.  Musculoskeletal:      Cervical back: Neck supple.  Skin:    General: Skin is warm and dry.  Neurological:     Mental Status: She is alert and oriented to person, place, and time.  Psychiatric:        Mood and Affect: Mood normal.        Behavior: Behavior normal.        Thought Content: Thought content normal.        Judgment: Judgment normal.      BP 118/79 (BP Location: Right Arm, Patient Position: Sitting)   Pulse 78   Temp 98.4 F (36.9 C) (Oral)   Resp 17   Ht 5' 4 (1.626 m)   Wt 183 lb (83 kg)   LMP 04/16/2024 (Approximate)   SpO2 100%   BMI 31.41 kg/m  Wt Readings from Last 3 Encounters:  05/10/24 183 lb (83 kg)  04/07/24 183 lb (83 kg)  01/18/24 177 lb 9.6 oz (80.6 kg)       Assessment & Plan:   Problem List Items Addressed This Visit       Unprioritized   Hypertension - Primary   BP stable/improved. Continue increased dose of toprol  xl (50mg ) and amlodipine  5mg .         I am having Kaitlin Foster maintain her rizatriptan , famotidine, Iron , Vitamin D3, docusate sodium , fluticasone , levocetirizine, amLODipine , ondansetron , Botox , Nurtec, cyclobenzaprine , metoprolol  succinate, lisdexamfetamine, and amoxicillin -clavulanate.  No orders of the defined types were placed in this encounter.

## 2024-05-10 NOTE — Assessment & Plan Note (Signed)
 BP stable/improved. Continue increased dose of toprol  xl (50mg ) and amlodipine  5mg .

## 2024-05-10 NOTE — Patient Instructions (Signed)
 VISIT SUMMARY:  Today, you had a follow-up visit to discuss your blood pressure management and weight management options. Your blood pressure is well-controlled with your current medications, and you are considering different options for weight management.  YOUR PLAN:  HYPERTENSION: Your blood pressure is well-controlled at 78 mmHg with your current medications, metoprolol  50 mg and amlodipine  5 mg. -Continue taking metoprolol  50 mg and amlodipine  5 mg as prescribed. -Monitor your blood pressure at home regularly.

## 2024-06-13 ENCOUNTER — Other Ambulatory Visit (HOSPITAL_BASED_OUTPATIENT_CLINIC_OR_DEPARTMENT_OTHER): Payer: Self-pay

## 2024-06-13 ENCOUNTER — Other Ambulatory Visit: Payer: Self-pay | Admitting: Family

## 2024-06-13 DIAGNOSIS — F900 Attention-deficit hyperactivity disorder, predominantly inattentive type: Secondary | ICD-10-CM

## 2024-06-13 MED ORDER — LISDEXAMFETAMINE DIMESYLATE 50 MG PO CAPS
50.0000 mg | ORAL_CAPSULE | Freq: Every day | ORAL | 0 refills | Status: DC
  Filled 2024-06-13: qty 30, 30d supply, fill #0

## 2024-06-18 ENCOUNTER — Telehealth

## 2024-06-18 DIAGNOSIS — R3989 Other symptoms and signs involving the genitourinary system: Secondary | ICD-10-CM | POA: Diagnosis not present

## 2024-06-19 ENCOUNTER — Other Ambulatory Visit: Payer: Self-pay

## 2024-06-19 MED ORDER — NITROFURANTOIN MONOHYD MACRO 100 MG PO CAPS
100.0000 mg | ORAL_CAPSULE | Freq: Two times a day (BID) | ORAL | 0 refills | Status: DC
  Filled 2024-06-19 – 2024-06-20 (×2): qty 10, 5d supply, fill #0

## 2024-06-19 NOTE — Progress Notes (Signed)

## 2024-06-20 ENCOUNTER — Other Ambulatory Visit (HOSPITAL_BASED_OUTPATIENT_CLINIC_OR_DEPARTMENT_OTHER): Payer: Self-pay

## 2024-06-20 ENCOUNTER — Other Ambulatory Visit: Payer: Self-pay

## 2024-07-27 ENCOUNTER — Other Ambulatory Visit: Payer: Self-pay

## 2024-07-27 ENCOUNTER — Other Ambulatory Visit (HOSPITAL_COMMUNITY): Payer: Self-pay

## 2024-07-27 ENCOUNTER — Other Ambulatory Visit: Payer: Self-pay | Admitting: Family

## 2024-07-27 DIAGNOSIS — F900 Attention-deficit hyperactivity disorder, predominantly inattentive type: Secondary | ICD-10-CM

## 2024-07-27 NOTE — Progress Notes (Signed)
 Specialty Pharmacy Refill Coordination Note  Clear Bag Patient  Kaitlin Foster is a 43 y.o. female contacted today regarding refills of specialty medication(s) OnabotulinumtoxinA  (Botox )  Doses on hand: 0   Injection appointment: 08/09/24  Patient requested: Courier to Provider Office   Delivery date: 08/02/24   Verified address: [facility] [address] [place] [address]  Medication will be filled on 08/01/24.

## 2024-07-28 ENCOUNTER — Other Ambulatory Visit (HOSPITAL_BASED_OUTPATIENT_CLINIC_OR_DEPARTMENT_OTHER): Payer: Self-pay

## 2024-07-28 ENCOUNTER — Other Ambulatory Visit: Payer: Self-pay

## 2024-07-28 MED ORDER — LEVOCETIRIZINE DIHYDROCHLORIDE 5 MG PO TABS
5.0000 mg | ORAL_TABLET | Freq: Every evening | ORAL | 1 refills | Status: AC
  Filled 2024-07-28 – 2024-09-20 (×2): qty 90, 90d supply, fill #0

## 2024-07-28 MED ORDER — AMLODIPINE BESYLATE 5 MG PO TABS
5.0000 mg | ORAL_TABLET | Freq: Every day | ORAL | 1 refills | Status: AC
  Filled 2024-07-28 – 2024-11-08 (×3): qty 90, 90d supply, fill #0

## 2024-07-28 NOTE — Telephone Encounter (Signed)
 Requesting: Vyvanse  50mg   Contract:12/08/23 UDS: 12/08/23 Last Visit: 05/10/24 Next Visit: 11/14/24 Last Refill: 06/13/24 #30 and 0RF   Please Advise

## 2024-07-29 MED ORDER — LISDEXAMFETAMINE DIMESYLATE 50 MG PO CAPS
50.0000 mg | ORAL_CAPSULE | Freq: Every day | ORAL | 0 refills | Status: DC
  Filled 2024-07-29: qty 30, 30d supply, fill #0

## 2024-07-30 ENCOUNTER — Other Ambulatory Visit (HOSPITAL_BASED_OUTPATIENT_CLINIC_OR_DEPARTMENT_OTHER): Payer: Self-pay

## 2024-07-31 ENCOUNTER — Other Ambulatory Visit (HOSPITAL_BASED_OUTPATIENT_CLINIC_OR_DEPARTMENT_OTHER): Payer: Self-pay

## 2024-08-03 ENCOUNTER — Telehealth: Payer: Self-pay | Admitting: Neurology

## 2024-08-03 NOTE — Telephone Encounter (Signed)
 Rescheduled Botox  from 10/1 to 10/7 with Amy.

## 2024-08-09 ENCOUNTER — Ambulatory Visit: Admitting: Neurology

## 2024-08-14 NOTE — Progress Notes (Deleted)
 08/14/24 ALL: Kaitlin Foster returns for follow up for Botox .   History (copied from Dr Sharion previous note)  05/08/2024 AA: Stable, doing well  02/10/2024 AA: aseline daily headaches and16 migraine days.Now only max 4-5 migraine days a month and > 50% decrease severity and increased treatability  11/18/2023 AA: still > 60% improvement severity and freq.   08/24/2022 AA: stable  06/02/2023 AA: acutely nurtec helps. Zofran  roo. aseline daily headaches and16 migraine days.Now only max 4-5 migraine days a month and > 50% decrease severity and increased treatability  12/14/2022 AA: stable, doin ggreat  09/21/2022 AA: 2 migraines a month, takes nurtec and ondansetron , doing great, gave a zavzpret  sample did not like    06/22/2022 AA: stable 03/19/2022 AA: stable, doing great 12/25/2021 AA: stable 05/27/2021 AA: Stable   Interval history 09/11/2021: Baseline daily headaches and16 migraine days. Since last botox  she has had only 2 total very mild migraines a month and acute management works, nurtec works with zofran , she had side effects to zembrace, also maxalt  works, takes with zofran . More than 25 free headache days a month, exceptional improvement!.  She is very happy. She is on adderall  now but not causing headache.    Orders: [facility] or [facility] for dry needling for cervical and lumbar/lumbosacral dry needling for myofascial disease in the past    Consent Form Botulism Toxin Injection For Chronic Migraine    Reviewed orally with patient, additionally signature is on file:  Botulism toxin has been approved by the Federal drug administration for treatment of chronic migraine. Botulism toxin does not cure chronic migraine and it may not be effective in some patients.  The administration of botulism toxin is accomplished by injecting a small amount of toxin into the muscles of the neck and head. Dosage must be titrated for each individual. Any benefits resulting from botulism  toxin tend to wear off after 3 months with a repeat injection required if benefit is to be maintained. Injections are usually done every 3-4 months with maximum effect peak achieved by about 2 or 3 weeks. Botulism toxin is expensive and you should be sure of what costs you will incur resulting from the injection.  The side effects of botulism toxin use for chronic migraine may include:   -Transient, and usually mild, facial weakness with facial injections  -Transient, and usually mild, head or neck weakness with head/neck injections  -Reduction or loss of forehead facial animation due to forehead muscle weakness  -Eyelid drooping  -Dry eye  -Pain at the site of injection or bruising at the site of injection  -Double vision  -Potential unknown long term risks   Contraindications: You should not have Botox  if you are pregnant, nursing, allergic to albumin, have an infection, skin condition, or muscle weakness at the site of the injection, or have myasthenia gravis, Lambert-Eaton syndrome, or ALS.  It is also possible that as with any injection, there may be an allergic reaction or no effect from the medication. Reduced effectiveness after repeated injections is sometimes seen and rarely infection at the injection site may occur. All care will be taken to prevent these side effects. If therapy is given over a long time, atrophy and wasting in the muscle injected may occur. Occasionally the patient's become refractory to treatment because they develop antibodies to the toxin. In this event, therapy needs to be modified.  I have read the above information and consent to the administration of botulism toxin.    BOTOX   PROCEDURE NOTE FOR MIGRAINE HEADACHE  Contraindications and precautions discussed with patient(above). Aseptic procedure was observed and patient tolerated procedure. Procedure performed by Greig Forbes, FNP-C.   The condition has existed for more than 6 months, and pt does not have a  diagnosis of ALS, Myasthenia Gravis or Lambert-Eaton Syndrome.  Risks and benefits of injections discussed and pt agrees to proceed with the procedure.  Written consent obtained  These injections are medically necessary. Pt  receives good benefits from these injections. These injections do not cause sedations or hallucinations which the oral therapies may cause.   Description of procedure:  The patient was placed in a sitting position. The standard protocol was used for Botox  as follows, with 5 units of Botox  injected at each site:  -Procerus muscle, midline injection  -Corrugator muscle, bilateral injection  -Frontalis muscle, bilateral injection, with 2 sites each side, medial injection was performed in the upper one third of the frontalis muscle, in the region vertical from the medial inferior edge of the superior orbital rim. The lateral injection was again in the upper one third of the forehead vertically above the lateral limbus of the cornea, 1.5 cm lateral to the medial injection site.  -Temporalis muscle injection, 4 sites, bilaterally. The first injection was 3 cm above the tragus of the ear, second injection site was 1.5 cm to 3 cm up from the first injection site in line with the tragus of the ear. The third injection site was 1.5-3 cm forward between the first 2 injection sites. The fourth injection site was 1.5 cm posterior to the second injection site. 5th site laterally in the temporalis  muscleat the level of the outer canthus.  -Occipitalis muscle injection, 3 sites, bilaterally. The first injection was done one half way between the occipital protuberance and the tip of the mastoid process behind the ear. The second injection site was done lateral and superior to the first, 1 fingerbreadth from the first injection. The third injection site was 1 fingerbreadth superiorly and medially from the first injection site.  -Cervical paraspinal muscle injection, 2 sites, bilaterally. The  first injection site was 1 cm from the midline of the cervical spine, 3 cm inferior to the lower border of the occipital protuberance. The second injection site was 1.5 cm superiorly and laterally to the first injection site.  -Trapezius muscle injection was performed at 3 sites, bilaterally. The first injection site was in the upper trapezius muscle halfway between the inflection point of the neck, and the acromion. The second injection site was one half way between the acromion and the first injection site. The third injection was done between the first injection site and the inflection point of the neck.   Will return for repeat injection in 3 months.   A total of 200 units of Botox  was prepared, 155 units of Botox  was injected as documented above, any Botox  not injected was wasted. The patient tolerated the procedure well, there were no complications of the above procedure.

## 2024-08-15 ENCOUNTER — Ambulatory Visit: Admitting: Family Medicine

## 2024-08-15 ENCOUNTER — Encounter: Payer: Self-pay | Admitting: Family Medicine

## 2024-08-15 DIAGNOSIS — G43711 Chronic migraine without aura, intractable, with status migrainosus: Secondary | ICD-10-CM

## 2024-09-11 NOTE — Progress Notes (Unsigned)
 09/13/24 ALL: Kaitlin Foster returns for follow up for Botox . Migraines have increased in intensity and frequency since last procedure 04/2024. Nurtec and ondansetron  work well for abortive therapy.   History (copied from Dr Sharion previous note)  05/08/2024 AA: Stable, doing well  02/10/2024 AA: aseline daily headaches and16 migraine days.Now only max 4-5 migraine days a month and > 50% decrease severity and increased treatability  11/18/2023 AA: still > 60% improvement severity and freq.   08/24/2022 AA: stable  06/02/2023 AA: acutely nurtec helps. Zofran  roo. aseline daily headaches and16 migraine days.Now only max 4-5 migraine days a month and > 50% decrease severity and increased treatability  12/14/2022 AA: stable, doin ggreat  09/21/2022 AA: 2 migraines a month, takes nurtec and ondansetron , doing great, gave a zavzpret  sample did not like    06/22/2022 AA: stable 03/19/2022 AA: stable, doing great 12/25/2021 AA: stable 05/27/2021 AA: Stable   Interval history 09/11/2021: Baseline daily headaches and16 migraine days. Since last botox  she has had only 2 total very mild migraines a month and acute management works, nurtec works with zofran , she had side effects to zembrace, also maxalt  works, takes with zofran . More than 25 free headache days a month, exceptional improvement!.  She is very happy. She is on adderall  now but not causing headache.    Orders: [facility] or [facility] for dry needling for cervical and lumbar/lumbosacral dry needling for myofascial disease in the past    Consent Form Botulism Toxin Injection For Chronic Migraine    Reviewed orally with patient, additionally signature is on file:  Botulism toxin has been approved by the Federal drug administration for treatment of chronic migraine. Botulism toxin does not cure chronic migraine and it may not be effective in some patients.  The administration of botulism toxin is accomplished by injecting a small  amount of toxin into the muscles of the neck and head. Dosage must be titrated for each individual. Any benefits resulting from botulism toxin tend to wear off after 3 months with a repeat injection required if benefit is to be maintained. Injections are usually done every 3-4 months with maximum effect peak achieved by about 2 or 3 weeks. Botulism toxin is expensive and you should be sure of what costs you will incur resulting from the injection.  The side effects of botulism toxin use for chronic migraine may include:   -Transient, and usually mild, facial weakness with facial injections  -Transient, and usually mild, head or neck weakness with head/neck injections  -Reduction or loss of forehead facial animation due to forehead muscle weakness  -Eyelid drooping  -Dry eye  -Pain at the site of injection or bruising at the site of injection  -Double vision  -Potential unknown long term risks   Contraindications: You should not have Botox  if you are pregnant, nursing, allergic to albumin, have an infection, skin condition, or muscle weakness at the site of the injection, or have myasthenia gravis, Lambert-Eaton syndrome, or ALS.  It is also possible that as with any injection, there may be an allergic reaction or no effect from the medication. Reduced effectiveness after repeated injections is sometimes seen and rarely infection at the injection site may occur. All care will be taken to prevent these side effects. If therapy is given over a long time, atrophy and wasting in the muscle injected may occur. Occasionally the patient's become refractory to treatment because they develop antibodies to the toxin. In this event, therapy needs to be modified.  I have read the above information and consent to the administration of botulism toxin.    BOTOX  PROCEDURE NOTE FOR MIGRAINE HEADACHE  Contraindications and precautions discussed with patient(above). Aseptic procedure was observed and patient  tolerated procedure. Procedure performed by Kaitlin Forbes, FNP-C.   The condition has existed for more than 6 months, and pt does not have a diagnosis of ALS, Myasthenia Gravis or Lambert-Eaton Syndrome.  Risks and benefits of injections discussed and pt agrees to proceed with the procedure.  Written consent obtained  These injections are medically necessary. Pt  receives good benefits from these injections. These injections do not cause sedations or hallucinations which the oral therapies may cause.   Description of procedure:  The patient was placed in a sitting position. The standard protocol was used for Botox  as follows, with 5 units of Botox  injected at each site:  -Procerus muscle, midline injection  -Corrugator muscle, bilateral injection  -Frontalis muscle, bilateral injection, with 2 sites each side, medial injection was performed in the upper one third of the frontalis muscle, in the region vertical from the medial inferior edge of the superior orbital rim. The lateral injection was again in the upper one third of the forehead vertically above the lateral limbus of the cornea, 1.5 cm lateral to the medial injection site.  -Temporalis muscle injection, 4 sites, bilaterally. The first injection was 3 cm above the tragus of the ear, second injection site was 1.5 cm to 3 cm up from the first injection site in line with the tragus of the ear. The third injection site was 1.5-3 cm forward between the first 2 injection sites. The fourth injection site was 1.5 cm posterior to the second injection site. 5th site laterally in the temporalis  muscleat the level of the outer canthus.  -Occipitalis muscle injection, 3 sites, bilaterally. The first injection was done one half way between the occipital protuberance and the tip of the mastoid process behind the ear. The second injection site was done lateral and superior to the first, 1 fingerbreadth from the first injection. The third injection site was 1  fingerbreadth superiorly and medially from the first injection site.  -Cervical paraspinal muscle injection, 2 sites, bilaterally. The first injection site was 1 cm from the midline of the cervical spine, 3 cm inferior to the lower border of the occipital protuberance. The second injection site was 1.5 cm superiorly and laterally to the first injection site.  -Trapezius muscle injection was performed at 3 sites, bilaterally. The first injection site was in the upper trapezius muscle halfway between the inflection point of the neck, and the acromion. The second injection site was one half way between the acromion and the first injection site. The third injection was done between the first injection site and the inflection point of the neck.   Will return for repeat injection in 3 months.   A total of 200 units of Botox  was prepared, 155 units of Botox  was injected as documented above, any Botox  not injected was wasted. The patient tolerated the procedure well, there were no complications of the above procedure.

## 2024-09-13 ENCOUNTER — Other Ambulatory Visit: Payer: Self-pay

## 2024-09-13 ENCOUNTER — Other Ambulatory Visit (HOSPITAL_BASED_OUTPATIENT_CLINIC_OR_DEPARTMENT_OTHER): Payer: Self-pay

## 2024-09-13 ENCOUNTER — Ambulatory Visit: Admitting: Family Medicine

## 2024-09-13 DIAGNOSIS — G43711 Chronic migraine without aura, intractable, with status migrainosus: Secondary | ICD-10-CM | POA: Diagnosis not present

## 2024-09-13 MED ORDER — NURTEC 75 MG PO TBDP
ORAL_TABLET | ORAL | 3 refills | Status: AC
  Filled 2024-09-13 – 2024-09-21 (×2): qty 16, 30d supply, fill #0
  Filled 2024-11-08: qty 16, 30d supply, fill #1

## 2024-09-13 MED ORDER — ONDANSETRON 4 MG PO TBDP
4.0000 mg | ORAL_TABLET | Freq: Three times a day (TID) | ORAL | 3 refills | Status: AC | PRN
  Filled 2024-09-13 – 2024-09-21 (×2): qty 30, 5d supply, fill #0
  Filled 2024-11-08: qty 30, 5d supply, fill #1

## 2024-09-13 MED ORDER — ONABOTULINUMTOXINA 200 UNITS IJ SOLR
155.0000 [IU] | Freq: Once | INTRAMUSCULAR | Status: AC
  Administered 2024-09-13: 155 [IU] via INTRAMUSCULAR

## 2024-09-13 NOTE — Progress Notes (Signed)
 Botox - 200 units x 1 vial Lot: [id number] Expiration: 2027/11 NDC: 0023-3921-02  Bacteriostatic 0.9% Sodium Chloride - 4 mL  Lot: [id number] Expiration: 09/08/25 NDC: 9590803397  Dx: H56.288 S/P  Witnessed by Briant CMA

## 2024-09-20 ENCOUNTER — Other Ambulatory Visit: Payer: Self-pay | Admitting: Family

## 2024-09-20 ENCOUNTER — Other Ambulatory Visit: Payer: Self-pay

## 2024-09-20 ENCOUNTER — Other Ambulatory Visit (HOSPITAL_COMMUNITY): Payer: Self-pay

## 2024-09-20 ENCOUNTER — Other Ambulatory Visit (HOSPITAL_BASED_OUTPATIENT_CLINIC_OR_DEPARTMENT_OTHER): Payer: Self-pay

## 2024-09-20 DIAGNOSIS — F900 Attention-deficit hyperactivity disorder, predominantly inattentive type: Secondary | ICD-10-CM

## 2024-09-20 MED ORDER — LISDEXAMFETAMINE DIMESYLATE 50 MG PO CAPS
50.0000 mg | ORAL_CAPSULE | Freq: Every day | ORAL | 0 refills | Status: DC
  Filled 2024-09-20 – 2024-09-21 (×2): qty 30, 30d supply, fill #0

## 2024-09-21 ENCOUNTER — Other Ambulatory Visit (HOSPITAL_COMMUNITY): Payer: Self-pay

## 2024-09-21 ENCOUNTER — Other Ambulatory Visit (HOSPITAL_BASED_OUTPATIENT_CLINIC_OR_DEPARTMENT_OTHER): Payer: Self-pay

## 2024-10-09 ENCOUNTER — Other Ambulatory Visit: Payer: Self-pay

## 2024-10-16 ENCOUNTER — Other Ambulatory Visit: Payer: Self-pay

## 2024-11-08 ENCOUNTER — Other Ambulatory Visit: Payer: Self-pay | Admitting: Family

## 2024-11-08 ENCOUNTER — Other Ambulatory Visit: Payer: Self-pay

## 2024-11-08 ENCOUNTER — Other Ambulatory Visit (HOSPITAL_BASED_OUTPATIENT_CLINIC_OR_DEPARTMENT_OTHER): Payer: Self-pay

## 2024-11-08 ENCOUNTER — Encounter (INDEPENDENT_AMBULATORY_CARE_PROVIDER_SITE_OTHER): Payer: Self-pay

## 2024-11-08 DIAGNOSIS — F900 Attention-deficit hyperactivity disorder, predominantly inattentive type: Secondary | ICD-10-CM

## 2024-11-08 DIAGNOSIS — I10 Essential (primary) hypertension: Secondary | ICD-10-CM

## 2024-11-08 MED ORDER — METOPROLOL SUCCINATE ER 50 MG PO TB24
50.0000 mg | ORAL_TABLET | Freq: Every day | ORAL | 1 refills | Status: AC
  Filled 2024-11-08: qty 90, 90d supply, fill #0

## 2024-11-08 MED ORDER — LISDEXAMFETAMINE DIMESYLATE 50 MG PO CAPS
50.0000 mg | ORAL_CAPSULE | Freq: Every day | ORAL | 0 refills | Status: DC
  Filled 2024-11-08: qty 30, 30d supply, fill #0

## 2024-11-14 ENCOUNTER — Other Ambulatory Visit (HOSPITAL_COMMUNITY): Payer: Self-pay

## 2024-11-14 ENCOUNTER — Ambulatory Visit: Admitting: Family

## 2024-11-14 ENCOUNTER — Ambulatory Visit: Admitting: Family Medicine

## 2024-11-14 ENCOUNTER — Other Ambulatory Visit (HOSPITAL_BASED_OUTPATIENT_CLINIC_OR_DEPARTMENT_OTHER): Payer: Self-pay

## 2024-11-14 VITALS — BP 131/75 | HR 87 | Temp 98.1°F | Resp 16 | Ht 64.0 in | Wt 196.0 lb

## 2024-11-14 DIAGNOSIS — G43711 Chronic migraine without aura, intractable, with status migrainosus: Secondary | ICD-10-CM | POA: Diagnosis not present

## 2024-11-14 DIAGNOSIS — E559 Vitamin D deficiency, unspecified: Secondary | ICD-10-CM | POA: Diagnosis not present

## 2024-11-14 DIAGNOSIS — F32A Depression, unspecified: Secondary | ICD-10-CM | POA: Diagnosis not present

## 2024-11-14 DIAGNOSIS — F900 Attention-deficit hyperactivity disorder, predominantly inattentive type: Secondary | ICD-10-CM | POA: Diagnosis not present

## 2024-11-14 DIAGNOSIS — J301 Allergic rhinitis due to pollen: Secondary | ICD-10-CM

## 2024-11-14 DIAGNOSIS — F419 Anxiety disorder, unspecified: Secondary | ICD-10-CM

## 2024-11-14 DIAGNOSIS — E66811 Obesity, class 1: Secondary | ICD-10-CM

## 2024-11-14 DIAGNOSIS — D649 Anemia, unspecified: Secondary | ICD-10-CM

## 2024-11-14 DIAGNOSIS — I10 Essential (primary) hypertension: Secondary | ICD-10-CM | POA: Diagnosis not present

## 2024-11-14 MED ORDER — LISDEXAMFETAMINE DIMESYLATE 60 MG PO CAPS
60.0000 mg | ORAL_CAPSULE | ORAL | 0 refills | Status: AC
  Filled 2024-11-14: qty 30, 30d supply, fill #0

## 2024-11-14 MED ORDER — AMLODIPINE BESYLATE 5 MG PO TABS
5.0000 mg | ORAL_TABLET | Freq: Every day | ORAL | 1 refills | Status: AC
  Filled 2024-11-14: qty 90, 90d supply, fill #0

## 2024-11-14 NOTE — Assessment & Plan Note (Signed)
 Stable without medication

## 2024-11-14 NOTE — Assessment & Plan Note (Signed)
 BP at goal- continue amlodipine  and toprol  xl.

## 2024-11-14 NOTE — Assessment & Plan Note (Signed)
 Continues to work on weight loss

## 2024-11-14 NOTE — Assessment & Plan Note (Signed)
 Lab Results  Component Value Date   WBC 4.2 12/08/2023   HGB 14.7 12/08/2023   HCT 45.3 12/08/2023   MCV 86.1 12/08/2023   PLT 228.0 12/08/2023   Not currently taking iron  supplement.  Bleeding has improved following embolization.

## 2024-11-14 NOTE — Assessment & Plan Note (Signed)
 Stable not needing xyzal  recently.

## 2024-11-14 NOTE — Assessment & Plan Note (Signed)
 Not currently taking the otc supplement.

## 2024-11-14 NOTE — Assessment & Plan Note (Addendum)
 Continues Nurtec/Zyrtec /botox , followed by Amy Lomax.

## 2024-11-14 NOTE — Patient Instructions (Signed)
" °  VISIT SUMMARY: Today, you came in for a medication check. We reviewed your current medications and discussed your health concerns, including ADHD, blood pressure, migraines, weight management, anemia, vitamin D  deficiency, and allergies. We also talked about your upcoming colonoscopy and mammogram.  YOUR PLAN: -ATTENTION-DEFICIT HYPERACTIVITY DISORDER, PREDOMINANTLY INATTENTIVE TYPE: ADHD is a condition that affects your ability to focus and stay organized. We have increased your Vyvanse  dose to 60 mg daily to help manage your symptoms better. Please update us  on its effectiveness when you request your next refill. A urine drug screen has also been ordered.  -PRIMARY HYPERTENSION: Your blood pressure is well-controlled with your current medications, amlodipine  5 mg and metoprolol  50 mg. Continue taking these medications as prescribed. A refill for amlodipine  has been sent to your pharmacy.  -CHRONIC MIGRAINE WITHOUT AURA, INTRACTABLE, WITH STATUS MIGRAINOSUS: Chronic migraines are severe headaches that occur frequently. Continue with your Botox  injections and use Nurtec and Zofran  as needed for acute migraine relief.  -OBESITY, CLASS 1: Obesity is a condition where excess body fat may affect your health. We discussed the potential use of oral Wegovy  for weight management. Please consider this option if you decide to pursue it.  -ANEMIA: Anemia is a condition where you don't have enough healthy red blood cells. We have ordered a complete blood count (CBC) and iron  studies to assess your current status.  -VITAMIN D  DEFICIENCY: Vitamin D  deficiency means you have lower than normal levels of vitamin D , which is important for bone health. We have ordered a vitamin D  level test to check your current status.  -SEASONAL ALLERGIC RHINITIS DUE TO POLLEN: This is an allergic reaction to pollen that causes symptoms like a runny nose and sneezing. Continue using Flonase  as needed for your allergy  symptoms.  INSTRUCTIONS: Please schedule your colonoscopy to evaluate for ulcerative colitis. Your mammogram is already scheduled for next month. If you are unsure about your hepatitis B vaccination status, we will check your hepatitis B titer. Continue to keep us  updated on any changes in your health or medications.                                           "

## 2024-11-14 NOTE — Assessment & Plan Note (Signed)
 Lab Results  Component Value Date   CHOL 125 03/08/2023   HDL 22.90 (L) 03/08/2023   LDLCALC 85 03/08/2023   TRIG 84.0 03/08/2023   CHOLHDL 5 03/08/2023

## 2024-11-14 NOTE — Progress Notes (Signed)
 ij  Subjective:     Patient ID: Kaitlin Foster, female    DOB: 09/30/1981, 44 y.o.   MRN: [id number]  Chief Complaint  Patient presents with   ADHD    Here for follow  up, last CSC and UDS 12/08/23   Hypertension    Here for follow up    Hypertension    Discussed the use of AI scribe software for clinical note transcription with the patient, who gave verbal consent to proceed.  History of Present Illness Kaitlin Foster is a 44 year old female who presents for a medication check.  She is currently taking Breo Ellipta, amlodipine  5 mg, and metoprolol  50 mg for blood pressure, which is well-controlled at 131/75 mmHg. She has ADHD and has been off Vyvanse  recently due to reduced work activity. She finds the medication more effective after a break and is currently on a 50 mg dose.  She has a history of iron  deficiency due to bleeding, which has improved significantly since undergoing uterine fibroid embolization about a year ago. She has stopped taking iron  supplements as the bleeding has become milder. She is planning a colonoscopy to check for ulcerative colitis, as her sister has the condition and her previous results were questionable.  She experiences migraines, managed with Botox  and medications like Nurtec and Zofran . The frequency of migraines has increased slightly, but the medications help control them.  She has a history of anxiety, which is currently well-controlled. She is concerned about her weight, noting it was better managed when she lost weight previously, which also alleviated her back pain.  She has not been taking Xyzal  for allergies due to concerns about potential side effects, but uses Flonase  as needed. 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 No current concerns about anxiety or depression, and her allergies are fairly well-controlled without daily medication.      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;   2 stage embolization ACA supply to AVM @ [facility]  Sept 11th and 18th 2008 then craniotomy resection AVM 07-28-2007   Hypertension    Left ureteral stone    Migraines    Nephrolithiasis    left renal nonobstructive stone per CT 06-14-2019   Uterine fibroid     Past Surgical History:  Procedure Laterality Date   CEREBRAL EMBOLIZATION  Sept 11th and 18th, 2008   @[facility]    2 staged embolization left ACA supply to AVM   CESAREAN SECTION  07/06/2009   CRANIOTOMY  07-29-2007   @ [facility]   resection left frontal AVM   IR EMBO TUMOR ORGAN ISCHEMIA INFARCT INC GUIDE ROADMAPPING  10/26/2023   IR FLUORO GUIDED NEEDLE PLC ASPIRATION/INJECTION LOC  10/26/2023   IR RADIOLOGIST EVAL & MGMT  10/12/2023    Family History  Problem Relation Age of Onset   Hypertension Mother    Arthritis Mother    Migraines Mother    Obesity Mother    Cancer Mother        sinus tumor   Hypertension Father    Alcohol abuse Father    Arthritis Father    Obesity Father    Migraines Sister    Ulcerative colitis Sister    Migraines Maternal Aunt    Colon cancer Maternal Grandmother 7   Cancer Maternal Grandmother        colon diagnosed at age 35   Dementia Maternal Grandmother    ADD / ADHD Son    Heart disease Neg Hx    Stomach cancer Neg Hx    Esophageal cancer Neg Hx     Social History   Socioeconomic History   Marital status: Married     Spouse name: Thad   Number of children: 1   Years of education: Not on file   Highest education level: Associate degree: occupational, scientist, product/process development, or vocational program  Occupational History   Occupation: [employer]   Occupation: engineer, civil (consulting)  Tobacco Use   Smoking status: Never   Smokeless tobacco: Never  Vaping Use   Vaping status: Never Used  Substance and Sexual Activity   Alcohol use: No    Alcohol/week: 0.0 standard drinks of alcohol   Drug use: No   Sexual activity: Yes    Birth control/protection: Condom  Other Topics Concern   Not on file  Social History Narrative   Regular exercise: no regular exercise   Caffeine use:  1-2 coffee daily   Son born 2010   Works as a engineer, 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[employer])   Married   Lives at home with her husband and child      Social Drivers of Health   Tobacco Use: Low Risk (05/10/2024)   Patient History    Smoking Tobacco Use: Never    Smokeless Tobacco Use: Never    Passive Exposure: Not on file  Financial Resource Strain: Low Risk (05/10/2024)   Overall Financial Resource Strain (CARDIA)    Difficulty of Paying Living Expenses: Not hard at all  Food Insecurity: No Food Insecurity (05/10/2024)   Epic    Worried About Radiation Protection Practitioner of Food in the Last Year: Never true    Ran Out of Food in the Last Year: Never true  Transportation Needs: No Transportation Needs (05/10/2024)   Epic    Lack of Transportation (Medical): No    Lack of Transportation (Non-Medical): No  Physical Activity: Inactive (05/10/2024)   Exercise Vital Sign    Days of Exercise per Week: 0 days    Minutes of Exercise per Session: Not on file  Stress: No Stress Concern Present (05/10/2024)   [employer] of Occupational Health - Occupational Stress Questionnaire    Feeling of Stress: Only a little  Social Connections: Socially Integrated (05/10/2024)   Social Connection and Isolation Panel    Frequency of Communication with Friends and Family: Three times a week    Frequency of  Social Gatherings with Friends and Family: Twice a week    Attends Religious Services: More than 4 times per year    Active Member of Clubs or Organizations: Yes    Attends Banker Meetings: More than 4 times per year    Marital Status: Married  Catering Manager Violence: Not on file  Depression (PHQ2-9): Low Risk (11/14/2024)   Depression (PHQ2-9)  PHQ-2 Score: 0  Alcohol Screen: Not on file  Housing: Unknown (05/10/2024)   Epic    Unable to Pay for Housing in the Last Year: No    Number of Times Moved in the Last Year: Not on file    Homeless in the Last Year: No  Utilities: Not on file  Health Literacy: Not on file    Outpatient Medications Prior to Visit  Medication Sig Dispense Refill   botulinum toxin Type A  (BOTOX ) 200 units injection Provider to inject 155 units into the muscles of the head and neck every 12 weeks. Discard remainder. 1 each 3   cyclobenzaprine  (FLEXERIL ) 10 MG tablet Take 0.5-1 tablets (5-10 mg total) by mouth 3 (three) times daily as needed. 30 tablet 0   docusate sodium  (COLACE) 50 MG capsule Take 50 mg by mouth 2 (two) times daily.     famotidine (PEPCID) 20 MG tablet Take 20 mg by mouth daily as needed for heartburn or indigestion.      fluticasone  (FLONASE ) 50 MCG/ACT nasal spray Place 2 sprays into both nostrils daily. 16 g 0   levocetirizine (XYZAL ) 5 MG tablet Take 1 tablet (5 mg total) by mouth every evening. 90 tablet 1   metoprolol  succinate (TOPROL -XL) 50 MG 24 hr tablet Take 1 tablet (50 mg total) by mouth daily. Take with or immediately following a meal. 90 tablet 1   ondansetron  (ZOFRAN -ODT) 4 MG disintegrating tablet Dissolve 1-2 tablets (4-8 mg total) by mouth every 8 (eight) hours as needed. 30 tablet 3   Rimegepant Sulfate  (NURTEC) 75 MG TBDP DISSOLVE 1 TABLET BY MOUTH DAILY AS NEEDED FOR MIGRAINES. TAKE AS CLOSE TO ONSET OF MIGRAINE AS POSSIBLE. ONE DAILY MAXIMUM. 48 tablet 3   rizatriptan  (MAXALT -MLT) 10 MG disintegrating tablet  Take 1 tablet (10 mg total) by mouth as needed for migraine. May repeat in 2 hours if needed 9 tablet 11   amLODipine  (NORVASC ) 5 MG tablet Take 1 tablet (5 mg total) by mouth daily. 90 tablet 1   Cholecalciferol (VITAMIN D3) 75 MCG (3000 UT) TABS Take 1 tablet by mouth daily. 30 tablet    Ferrous Sulfate  (IRON ) 325 (65 Fe) MG TABS Take 1 tablet (325 mg total) by mouth daily. 30 tablet 0   lisdexamfetamine  (VYVANSE ) 50 MG capsule Take 1 capsule (50 mg total) by mouth daily. 30 capsule 0   No facility-administered medications prior to visit.    Allergies[1]  ROS See HPI    Objective:    Physical Exam Constitutional:      General: She is not in acute distress.    Appearance: Normal appearance. She is well-developed.  HENT:     Head: Normocephalic and atraumatic.     Right Ear: External ear normal.     Left Ear: External ear normal.  Eyes:     General: No scleral icterus. Neck:     Thyroid : No thyromegaly.  Cardiovascular:     Rate and Rhythm: Normal rate and regular rhythm.     Heart sounds: Normal heart sounds. No murmur heard. Pulmonary:     Effort: Pulmonary effort is normal. No respiratory distress.     Breath sounds: Normal breath sounds. No wheezing.  Musculoskeletal:     Cervical back: Neck supple.  Skin:    General: Skin is warm and dry.  Neurological:     Mental Status: She is alert and oriented to person, place, and time.  Psychiatric:        Mood and  Affect: Mood normal.        Behavior: Behavior normal.        Thought Content: Thought content normal.        Judgment: Judgment normal.      BP 131/75 (BP Location: Right Arm, Patient Position: Sitting, Cuff Size: Normal)   Pulse 87   Temp 98.1 F (36.7 C) (Oral)   Resp 16   Ht 5' 4 (1.626 m)   Wt 196 lb (88.9 kg)   SpO2 100%   BMI 33.64 kg/m  Wt Readings from Last 3 Encounters:  11/14/24 196 lb (88.9 kg)  05/10/24 183 lb (83 kg)  04/07/24 183 lb (83 kg)       Assessment & Plan:   Problem  List Items Addressed This Visit       Unprioritized   Vitamin D  deficiency   Not currently taking the otc supplement.       Relevant Orders   Vitamin D  (25 hydroxy)   Obesity (BMI 30.0-34.9)   Continues to work on weight loss.      Hypertension - Primary   BP at goal- continue amlodipine  and toprol  xl.      Relevant Medications   amLODipine  (NORVASC ) 5 MG tablet   Chronic migraine without aura, with intractable migraine, so stated, with status migrainosus   Continues Nurtec/Zyrtec /botox , followed by Amy Lomax.       Relevant Medications   amLODipine  (NORVASC ) 5 MG tablet   Anxiety and depression   Stable without medication.       Anemia   Lab Results  Component Value Date   WBC 4.2 12/08/2023   HGB 14.7 12/08/2023   HCT 45.3 12/08/2023   MCV 86.1 12/08/2023   PLT 228.0 12/08/2023   Not currently taking iron  supplement.  Bleeding has improved following embolization.       Relevant Orders   IBC + Ferritin   CBC w/Diff   Allergic rhinitis   Stable not needing xyzal  recently.        ADHD, predominantly inattentive type   Continues to find some improvement with vyvanse  but still symptomatic- will try increasing from 50mg  to 60 mg. Updated controlled substance contract, will update UDS as well today.       Relevant Medications   lisdexamfetamine  (VYVANSE ) 60 MG capsule   Other Relevant Orders   DRUG MONITORING, PANEL 8 WITH CONFIRMATION, URINE    I have discontinued Carrie H. Hinojosa's Iron , Vitamin D3, and lisdexamfetamine . I am also having her start on lisdexamfetamine . Additionally, I am having her maintain her rizatriptan , famotidine, docusate sodium , fluticasone , Botox , cyclobenzaprine , levocetirizine, Nurtec, ondansetron , metoprolol  succinate, and amLODipine .  Meds ordered this encounter  Medications   lisdexamfetamine  (VYVANSE ) 60 MG capsule    Sig: Take 1 capsule (60 mg total) by mouth every morning.    Dispense:  30 capsule    Refill:  0     Supervising Provider:   DOMENICA BLACKBIRD A [[id number]]   amLODipine  (NORVASC ) 5 MG tablet    Sig: Take 1 tablet (5 mg total) by mouth daily.    Dispense:  90 tablet    Refill:  1    Supervising Provider:   DOMENICA BLACKBIRD A [[id number]]      [1]  Allergies Allergen Reactions   Sulfa Antibiotics Hives   Lisinopril Other (See Comments)    cough

## 2024-11-14 NOTE — Assessment & Plan Note (Addendum)
 Continues to find some improvement with vyvanse  but still symptomatic- will try increasing from 50mg  to 60 mg. Updated controlled substance contract, will update UDS as well today.

## 2024-11-15 ENCOUNTER — Ambulatory Visit: Payer: Self-pay | Admitting: Family

## 2024-11-15 ENCOUNTER — Other Ambulatory Visit (HOSPITAL_BASED_OUTPATIENT_CLINIC_OR_DEPARTMENT_OTHER): Payer: Self-pay

## 2024-11-15 DIAGNOSIS — D509 Iron deficiency anemia, unspecified: Secondary | ICD-10-CM

## 2024-11-15 DIAGNOSIS — E559 Vitamin D deficiency, unspecified: Secondary | ICD-10-CM

## 2024-11-15 LAB — DRUG MONITORING, PANEL 8 WITH CONFIRMATION, URINE
6 Acetylmorphine: NEGATIVE ng/mL
Alcohol Metabolites: NEGATIVE ng/mL
Amphetamines: NEGATIVE ng/mL
Benzodiazepines: NEGATIVE ng/mL
Buprenorphine, Urine: NEGATIVE ng/mL
Cocaine Metabolite: NEGATIVE ng/mL
Creatinine: 158.3 mg/dL
MDMA: NEGATIVE ng/mL
Marijuana Metabolite: NEGATIVE ng/mL
Opiates: NEGATIVE ng/mL
Oxidant: NEGATIVE ug/mL
Oxycodone: NEGATIVE ng/mL
pH: 6.3 (ref 4.5–9.0)

## 2024-11-15 LAB — VITAMIN D 25 HYDROXY (VIT D DEFICIENCY, FRACTURES): VITD: 21.92 ng/mL — ABNORMAL LOW (ref 30.00–100.00)

## 2024-11-15 LAB — CBC WITH DIFFERENTIAL/PLATELET
Basophils Absolute: 0 K/uL (ref 0.0–0.1)
Basophils Relative: 0.5 % (ref 0.0–3.0)
Eosinophils Absolute: 0.1 K/uL (ref 0.0–0.7)
Eosinophils Relative: 2.1 % (ref 0.0–5.0)
HCT: 39.1 % (ref 36.0–46.0)
Hemoglobin: 12.5 g/dL (ref 12.0–15.0)
Lymphocytes Relative: 20.1 % (ref 12.0–46.0)
Lymphs Abs: 1.4 K/uL (ref 0.7–4.0)
MCHC: 31.9 g/dL (ref 30.0–36.0)
MCV: 79 fl (ref 78.0–100.0)
Monocytes Absolute: 0.6 K/uL (ref 0.1–1.0)
Monocytes Relative: 8.6 % (ref 3.0–12.0)
Neutro Abs: 4.6 K/uL (ref 1.4–7.7)
Neutrophils Relative %: 68.7 % (ref 43.0–77.0)
Platelets: 282 K/uL (ref 150.0–400.0)
RBC: 4.95 Mil/uL (ref 3.87–5.11)
RDW: 15.7 % — ABNORMAL HIGH (ref 11.5–15.5)
WBC: 6.8 K/uL (ref 4.0–10.5)

## 2024-11-15 LAB — DM TEMPLATE

## 2024-11-15 LAB — IBC + FERRITIN
Ferritin: 4.7 ng/mL — ABNORMAL LOW (ref 10.0–291.0)
Iron: 21 ug/dL — ABNORMAL LOW (ref 42–145)
Saturation Ratios: 4.7 % — ABNORMAL LOW (ref 20.0–50.0)
TIBC: 450.8 ug/dL — ABNORMAL HIGH (ref 250.0–450.0)
Transferrin: 322 mg/dL (ref 212.0–360.0)

## 2024-11-15 MED ORDER — IRON (FERROUS SULFATE) 325 (65 FE) MG PO TABS: 325.0000 mg | ORAL_TABLET | ORAL | Status: AC

## 2024-11-15 MED ORDER — VITAMIN D (ERGOCALCIFEROL) 1.25 MG (50000 UNIT) PO CAPS
50000.0000 [IU] | ORAL_CAPSULE | ORAL | 0 refills | Status: AC
  Filled 2024-11-15: qty 12, 84d supply, fill #0

## 2024-11-15 NOTE — Telephone Encounter (Signed)
 See Mychart.

## 2024-11-20 ENCOUNTER — Ambulatory Visit: Payer: Self-pay

## 2024-11-20 ENCOUNTER — Encounter: Payer: Self-pay | Admitting: Urgent Care

## 2024-11-20 ENCOUNTER — Ambulatory Visit: Admitting: Urgent Care

## 2024-11-20 VITALS — BP 110/72 | HR 71 | Temp 97.9°F | Ht 64.0 in | Wt 192.0 lb

## 2024-11-20 DIAGNOSIS — J069 Acute upper respiratory infection, unspecified: Secondary | ICD-10-CM | POA: Diagnosis not present

## 2024-11-20 DIAGNOSIS — J040 Acute laryngitis: Secondary | ICD-10-CM

## 2024-11-20 LAB — POC COVID19/FLU A&B COMBO
Covid Antigen, POC: NEGATIVE
Influenza A Antigen, POC: NEGATIVE
Influenza B Antigen, POC: NEGATIVE

## 2024-11-20 MED ORDER — BENZONATATE 200 MG PO CAPS: 200.0000 mg | ORAL_CAPSULE | Freq: Two times a day (BID) | ORAL | 0 refills | Status: AC | PRN

## 2024-11-20 NOTE — Patient Instructions (Addendum)
 Your flu and covid tests are negative.  Your symptoms are consistent with a viral illness. Increase your water intake and REST! May try OTC oscillococcinum for body aches. OTC quercetin with zinc may boost your immune system to help fight off the virus.  Alternating tylenol  and ibuprofen may also help with fevers and body aches. RTC for any new or worsening symptoms.   Take the cough medication twice daily as needed.

## 2024-11-20 NOTE — Telephone Encounter (Signed)
 FYI Only or Action Required?: FYI only for provider: appointment scheduled on 11/21/2023 at [facility] [place].  Patient was last seen in primary care on 11/14/2024 by Daryl Setter, NP.  Called Nurse Triage reporting Cough, Facial Pain, and Otalgia.  Symptoms began several days ago.  Interventions attempted: OTC medications: mucinex, sudafed, tylenol .  Symptoms are: gradually worsening.  Triage Disposition: See HCP Within 4 Hours (Or PCP Triage)  Patient/caregiver understands and will follow disposition?: Yes  Copied from CRM #[id number]. Topic: Clinical - Red Word Triage >> Nov 20, 2024  8:19 AM Revonda D wrote: Red Word that prompted transfer to Nurse Triage: pain   Pt stated that she is experiencing ear pain, sinus pain and a cough since Saturday. Pt wants to get an appt scheduled with her PCP if possible. Reason for Disposition  Wheezing is present  Answer Assessment - Initial Assessment Questions 1. ONSET: When did the cough begin?      Two days ago  3. SPUTUM: Describe the color of your sputum (e.g., none, dry cough; clear, white, yellow, green)     yellow 4. HEMOPTYSIS: Are you coughing up any blood? If Yes, ask: How much? (e.g., flecks, streaks, tablespoons, etc.)     denies 5. DIFFICULTY BREATHING: Are you having difficulty breathing? If Yes, ask: How bad is it? (e.g., mild, moderate, severe)      Reports that chest feels a little tight occasionally 6. FEVER: Do you have a fever? If Yes, ask: What is your temperature, how was it measured, and when did it start?     denies 7. CARDIAC HISTORY: Do you have any history of heart disease? (e.g., heart attack, congestive heart failure)      HTN 8. LUNG HISTORY: Do you have any history of lung disease?  (e.g., pulmonary embolus, asthma, emphysema)     denies  10. OTHER SYMPTOMS: Do you have any other symptoms? (e.g., runny nose, wheezing, chest pain)       Runny nose, sinus pain and pressure to  right side of forehead, eye, and face. Right ear pain and pressure  12. TRAVEL: Have you traveled out of the country in the last month? (e.g., travel history, exposures)       denies  Protocols used: Cough - Acute Productive-A-AH

## 2024-11-20 NOTE — Progress Notes (Signed)
 "  Established Patient Office Visit  Subjective:  Patient ID: Kaitlin Foster, female    DOB: August 10, 1981  Age: 44 y.o. MRN: [id number]  Chief Complaint  Patient presents with   Cough    Sinus pressure, right ear pain, hoarse    HPI  Kaitlin Foster is a 44 year old female who presents with upper respiratory symptoms.  She has been experiencing upper respiratory symptoms since Saturday, including headaches, nasal drainage, rhinorrhea, and a scratchy throat. She has also lost her voice and has a persistent cough, which she feels is irritating her throat. Cough is productive of colored phlegm, denies blood.  No fevers have been noted, and her temperature in the office today was normal. She reports sinus tenderness, particularly on the right side, and a sensation of pressure in her right ear, although there is no pain in the ear. She also reports body aches. No GI sx.   Her son was ill a few days before her symptoms began, but he tested negative for influenza and COVID-19. He is 43yo. Denies any additional sick contacts. Has been taking OTC meds with no relief.      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   Anemia 03/21/2014   General medical examination 02/29/2012   Anxiety and depression 02/29/2012   Hyperlipidemia 10/12/2008   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    Anemia    Anxiety    Chronic migraine    neurologist-  dr ines--- botox  injecitons every 3 months   GERD (gastroesophageal reflux disease)    History of arteriovenous malformation (AVM) (06-16-2019-  per pt residual very very mild aphsia and slight facial droop)   06-05-2007  ED visit w/ severe headche for 3 days---  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;   2 stage embolization ACA supply to AVM @ [facility]  Sept 11th and 18th 2008 then craniotomy resection AVM 07-28-2007   Hypertension    Left ureteral stone    Migraines    Nephrolithiasis    left renal nonobstructive stone per CT 06-14-2019   Uterine fibroid    Past Surgical History:  Procedure Laterality Date   BRAIN SURGERY  2009   Crani for AVM removal   CEREBRAL EMBOLIZATION  Sept 11th and 18th, 2008   @[facility]    2 staged embolization left ACA supply to AVM   CESAREAN SECTION  07/06/2009   CRANIOTOMY  07-29-2007   @ [facility]   resection left frontal AVM   IR EMBO TUMOR ORGAN ISCHEMIA INFARCT INC GUIDE ROADMAPPING  10/26/2023   IR FLUORO GUIDED NEEDLE PLC ASPIRATION/INJECTION LOC  10/26/2023   IR RADIOLOGIST EVAL & MGMT  10/12/2023   Social History[1]    ROS: as noted in HPI  Objective:     BP 110/72   Pulse 71   Temp 97.9 F (36.6 C)   Ht 5' 4 (  1.626 m)   Wt 192 lb (87.1 kg)   SpO2 99%   BMI 32.96 kg/m  BP Readings from Last 3 Encounters:  11/20/24 110/72  11/14/24 131/75  05/10/24 118/79   Wt Readings from Last 3 Encounters:  11/20/24 192 lb (87.1 kg)  11/14/24 196 lb (88.9 kg)  05/10/24 183 lb (83 kg)      Physical Exam Vitals and nursing note reviewed. Exam conducted with a chaperone present.  Constitutional:      General: She is not in acute distress.    Appearance: Normal appearance. She is ill-appearing. She is not toxic-appearing or diaphoretic.  HENT:     Head: Normocephalic and atraumatic.     Right Ear: Tympanic membrane, ear canal and external ear normal. There is no  impacted cerumen.     Left Ear: Tympanic membrane, ear canal and external ear normal. There is no impacted cerumen.     Nose: Congestion and rhinorrhea present. Rhinorrhea is clear.     Right Turbinates: Enlarged.     Left Turbinates: Enlarged.     Right Sinus: Frontal sinus tenderness present. No maxillary sinus tenderness.     Left Sinus: No maxillary sinus tenderness or frontal sinus tenderness.     Mouth/Throat:     Lips: Pink.     Mouth: Mucous membranes are moist.     Pharynx: Oropharynx is clear. Uvula midline. No pharyngeal swelling, oropharyngeal exudate, posterior oropharyngeal erythema, uvula swelling or postnasal drip.  Cardiovascular:     Rate and Rhythm: Normal rate and regular rhythm.     Pulses: Normal pulses.     Heart sounds: Normal heart sounds. No murmur heard.    No friction rub. No gallop.  Pulmonary:     Effort: Pulmonary effort is normal. No respiratory distress.     Breath sounds: Normal breath sounds. No stridor. No wheezing, rhonchi or rales.  Musculoskeletal:     Cervical back: Normal range of motion and neck supple. No rigidity or tenderness.  Lymphadenopathy:     Cervical: No cervical adenopathy.  Skin:    General: Skin is warm and dry.     Coloration: Skin is not jaundiced.     Findings: No erythema.  Neurological:     Mental Status: She is alert and oriented to person, place, and time.  Psychiatric:        Mood and Affect: Mood normal.        Behavior: Behavior normal.      Results for orders placed or performed in visit on 11/20/24  POC Covid19/Flu A&B Antigen  Result Value Ref Range   Influenza A Antigen, POC Negative Negative   Influenza B Antigen, POC Negative Negative   Covid Antigen, POC Negative Negative      The ASCVD Risk score (Arnett DK, et al., 2019) failed to calculate for the following reasons:   The valid total cholesterol range is 130 to 320 mg/dL  Assessment & Plan:  Laryngitis -     POC Covid19/Flu A&B  Antigen  Viral upper respiratory tract infection with cough -     Benzonatate ; Take 1 capsule (200 mg total) by mouth 2 (two) times daily as needed for cough.  Dispense: 20 capsule; Refill: 0  Assessment and Plan    Acute upper respiratory infection Nasal drainage, rhinorrhea, pharyngitis, sinus tenderness. Differential includes viral infection. Son tested negative for influenza and COVID-19. - Ordered influenza and COVID-19 tests, negative in office.  Acute laryngitis Loss of voice and scratchy  throat likely secondary to upper respiratory infection. No indication for antibiotics. - Continue supportive care.         No follow-ups on file.   Benton LITTIE Gave, PA    [1]  Social History Tobacco Use   Smoking status: Never   Smokeless tobacco: Never  Vaping Use   Vaping status: Never Used  Substance Use Topics   Alcohol use: No    Alcohol/week: 0.0 standard drinks of alcohol   Drug use: No   "

## 2024-11-20 NOTE — Telephone Encounter (Signed)
 Appt scheduled

## 2024-11-22 ENCOUNTER — Other Ambulatory Visit (HOSPITAL_BASED_OUTPATIENT_CLINIC_OR_DEPARTMENT_OTHER): Payer: Self-pay

## 2024-11-22 ENCOUNTER — Telehealth: Admitting: Family Medicine

## 2024-11-22 DIAGNOSIS — B9689 Other specified bacterial agents as the cause of diseases classified elsewhere: Secondary | ICD-10-CM

## 2024-11-22 DIAGNOSIS — J019 Acute sinusitis, unspecified: Secondary | ICD-10-CM

## 2024-11-22 MED ORDER — AMOXICILLIN-POT CLAVULANATE 875-125 MG PO TABS
1.0000 | ORAL_TABLET | Freq: Two times a day (BID) | ORAL | 0 refills | Status: AC
  Filled 2024-11-22: qty 14, 7d supply, fill #0

## 2024-11-22 MED ORDER — IPRATROPIUM BROMIDE 0.03 % NA SOLN
2.0000 | Freq: Two times a day (BID) | NASAL | 0 refills | Status: AC
  Filled 2024-11-22: qty 30, 43d supply, fill #0

## 2024-11-22 NOTE — Progress Notes (Signed)
 E-Visit for Sinus Problems  We are sorry that you are not feeling well.  Here is how we plan to help!  Based on what you have shared with me it looks like you have sinusitis.  Sinusitis is inflammation and infection in the sinus cavities of the head.  Based on your presentation I believe you most likely have Acute Bacterial Sinusitis.  This is an infection caused by bacteria and is treated with antibiotics. I have prescribed Augmentin 875mg /125mg  one tablet twice daily with food, for 7 days. and I have also prescribed Ipratropium Bromide Nasal Spray Use 1 spray in each nostril twice daily as needed for drainage; discontinue if too drying You may use an oral decongestant such as Mucinex  D or if you have glaucoma or high blood pressure use plain Mucinex . Saline nasal spray help and can safely be used as often as needed for congestion.  If you develop worsening sinus pain, fever or notice severe headache and vision changes, or if symptoms are not better after completion of antibiotic, please schedule an appointment with a health care provider.    Sinus infections are not as easily transmitted as other respiratory infection, however we still recommend that you avoid close contact with loved ones, especially the very young and elderly.  Remember to wash your hands thoroughly throughout the day as this is the number one way to prevent the spread of infection!  Home Care: Only take medications as instructed by your medical team. Complete the entire course of an antibiotic. Do not take these medications with alcohol. A steam or ultrasonic humidifier can help congestion.  You can place a towel over your head and breathe in the steam from hot water coming from a faucet. Avoid close contacts especially the very young and the elderly. Cover your mouth when you cough or sneeze. Always remember to wash your hands.  Get Help Right Away If: You develop worsening fever or sinus pain. You develop a severe head  ache or visual changes. Your symptoms persist after you have completed your treatment plan.  Make sure you Understand these instructions. Will watch your condition. Will get help right away if you are not doing well or get worse.  Your e-visit answers were reviewed by a board certified advanced clinical practitioner to complete your personal care plan.  Depending on the condition, your plan could have included both over the counter or prescription medications.  If there is a problem please reply  once you have received a response from your provider.  Your safety is important to us .  If you have drug allergies check your prescription carefully.    You can use MyChart to ask questions about today's visit, request a non-urgent call back, or ask for a work or school excuse for 24 hours related to this e-Visit. If it has been greater than 24 hours you will need to follow up with your provider, or enter a new e-Visit to address those concerns.  You will get an e-mail in the next two days asking about your experience.  I hope that your e-visit has been valuable and will speed your recovery. Thank you for using e-visits.  I have spent 5 minutes in review of e-visit questionnaire, review and updating patient chart, medical decision making and response to patient.   Chiquita CHRISTELLA Barefoot, NP

## 2024-12-11 ENCOUNTER — Ambulatory Visit: Admitting: Family Medicine

## 2024-12-12 ENCOUNTER — Other Ambulatory Visit: Payer: Self-pay | Admitting: Family Medicine

## 2024-12-12 ENCOUNTER — Other Ambulatory Visit: Payer: Self-pay

## 2024-12-12 DIAGNOSIS — G43711 Chronic migraine without aura, intractable, with status migrainosus: Secondary | ICD-10-CM

## 2024-12-12 MED ORDER — BOTOX 200 UNITS IJ SOLR
INTRAMUSCULAR | 3 refills | Status: AC
  Filled 2024-12-12: qty 1, fill #0
  Filled 2024-12-13: qty 1, 84d supply, fill #0

## 2024-12-12 NOTE — Telephone Encounter (Signed)
 Please send Botox  refills to [facility]- appt on 2/10.

## 2024-12-13 ENCOUNTER — Other Ambulatory Visit: Payer: Self-pay | Admitting: Pharmacy Technician

## 2024-12-13 ENCOUNTER — Other Ambulatory Visit: Payer: Self-pay

## 2024-12-13 NOTE — Progress Notes (Signed)
 Specialty Pharmacy Refill Coordination Note  Kaitlin Foster is a 44 y.o. female assessed today regarding refills of clinic administered specialty medication(s) OnabotulinumtoxinA  (Botox )   Clinic requested Courier to Provider Office   Delivery date: 12/14/24   Verified address: [facility] [address] [place] [address]   Medication will be filled on: 12/13/24

## 2024-12-14 ENCOUNTER — Encounter (HOSPITAL_BASED_OUTPATIENT_CLINIC_OR_DEPARTMENT_OTHER): Payer: Self-pay

## 2024-12-14 ENCOUNTER — Ambulatory Visit (HOSPITAL_BASED_OUTPATIENT_CLINIC_OR_DEPARTMENT_OTHER)
Admission: RE | Admit: 2024-12-14 | Discharge: 2024-12-14 | Disposition: A | Source: Ambulatory Visit | Attending: Family | Admitting: Family

## 2024-12-14 ENCOUNTER — Ambulatory Visit (HOSPITAL_BASED_OUTPATIENT_CLINIC_OR_DEPARTMENT_OTHER)

## 2024-12-14 ENCOUNTER — Other Ambulatory Visit (HOSPITAL_BASED_OUTPATIENT_CLINIC_OR_DEPARTMENT_OTHER): Payer: Self-pay | Admitting: Family

## 2024-12-14 DIAGNOSIS — Z1231 Encounter for screening mammogram for malignant neoplasm of breast: Secondary | ICD-10-CM

## 2024-12-15 ENCOUNTER — Encounter (HOSPITAL_BASED_OUTPATIENT_CLINIC_OR_DEPARTMENT_OTHER): Admitting: Radiology

## 2024-12-15 DIAGNOSIS — Z1231 Encounter for screening mammogram for malignant neoplasm of breast: Secondary | ICD-10-CM

## 2024-12-19 ENCOUNTER — Ambulatory Visit: Admitting: Family Medicine

## 2024-12-26 ENCOUNTER — Ambulatory Visit: Admitting: Neurology

## 2025-01-03 ENCOUNTER — Ambulatory Visit: Admitting: Neurology

## 2025-05-18 ENCOUNTER — Ambulatory Visit: Admitting: Family
